# Patient Record
Sex: Male | Born: 1945
Health system: Southern US, Community
[De-identification: ages and names within clinical notes are randomized; demographics above are authoritative.]

## PROBLEM LIST (undated history)

## (undated) DIAGNOSIS — N189 Chronic kidney disease, unspecified: Secondary | ICD-10-CM

## (undated) DIAGNOSIS — I10 Essential (primary) hypertension: Secondary | ICD-10-CM

## (undated) DIAGNOSIS — J189 Pneumonia, unspecified organism: Secondary | ICD-10-CM

## (undated) DIAGNOSIS — U071 COVID-19: Secondary | ICD-10-CM

## (undated) DIAGNOSIS — I639 Cerebral infarction, unspecified: Secondary | ICD-10-CM

## (undated) DIAGNOSIS — K219 Gastro-esophageal reflux disease without esophagitis: Secondary | ICD-10-CM

## (undated) DIAGNOSIS — I2510 Atherosclerotic heart disease of native coronary artery without angina pectoris: Secondary | ICD-10-CM

## (undated) DIAGNOSIS — J449 Chronic obstructive pulmonary disease, unspecified: Secondary | ICD-10-CM

## (undated) DIAGNOSIS — R011 Cardiac murmur, unspecified: Secondary | ICD-10-CM

## (undated) DIAGNOSIS — B20 Human immunodeficiency virus [HIV] disease: Secondary | ICD-10-CM

## (undated) DIAGNOSIS — G459 Transient cerebral ischemic attack, unspecified: Secondary | ICD-10-CM

## (undated) DIAGNOSIS — Z87442 Personal history of urinary calculi: Secondary | ICD-10-CM

## (undated) DIAGNOSIS — K635 Polyp of colon: Secondary | ICD-10-CM

## (undated) HISTORY — DX: Atherosclerotic heart disease of native coronary artery without angina pectoris: I25.10

## (undated) HISTORY — DX: Chronic obstructive pulmonary disease, unspecified: J44.9

## (undated) HISTORY — DX: Transient cerebral ischemic attack, unspecified: G45.9

## (undated) HISTORY — DX: Human immunodeficiency virus (HIV) disease: B20

## (undated) HISTORY — DX: Chronic kidney disease, unspecified: N18.9

## (undated) HISTORY — PX: CATARACT EXTRACTION W/ INTRAOCULAR LENS IMPLANT: SHX1309

## (undated) HISTORY — DX: COVID-19: U07.1

## (undated) HISTORY — DX: Polyp of colon: K63.5

## (undated) HISTORY — DX: Gastro-esophageal reflux disease without esophagitis: K21.9

## (undated) HISTORY — PX: COLONOSCOPY: SHX174

## (undated) HISTORY — PX: KNEE SURGERY: SHX244

## (undated) HISTORY — PX: TONSILLECTOMY: SUR1361

---

## 1980-05-08 HISTORY — PX: CHOLECYSTECTOMY: SHX55

## 1997-05-08 DIAGNOSIS — B20 Human immunodeficiency virus [HIV] disease: Secondary | ICD-10-CM

## 1997-05-08 DIAGNOSIS — Z21 Asymptomatic human immunodeficiency virus [HIV] infection status: Secondary | ICD-10-CM

## 1997-05-08 HISTORY — DX: Asymptomatic human immunodeficiency virus (hiv) infection status: Z21

## 1997-05-08 HISTORY — DX: Human immunodeficiency virus (HIV) disease: B20

## 1998-04-07 ENCOUNTER — Encounter (INDEPENDENT_AMBULATORY_CARE_PROVIDER_SITE_OTHER): Payer: Self-pay | Admitting: *Deleted

## 1998-04-07 LAB — CONVERTED CEMR LAB: CD4 Count: 130 microliters

## 1998-06-28 ENCOUNTER — Encounter: Admission: RE | Admit: 1998-06-28 | Discharge: 1998-06-28 | Payer: Self-pay | Admitting: Internal Medicine

## 1998-06-28 ENCOUNTER — Ambulatory Visit (HOSPITAL_COMMUNITY): Admission: RE | Admit: 1998-06-28 | Discharge: 1998-06-28 | Payer: Self-pay | Admitting: Internal Medicine

## 1998-07-19 ENCOUNTER — Encounter: Admission: RE | Admit: 1998-07-19 | Discharge: 1998-07-19 | Payer: Self-pay | Admitting: Internal Medicine

## 1998-08-23 ENCOUNTER — Encounter: Admission: RE | Admit: 1998-08-23 | Discharge: 1998-08-23 | Payer: Self-pay | Admitting: Internal Medicine

## 1998-10-18 ENCOUNTER — Ambulatory Visit (HOSPITAL_COMMUNITY): Admission: RE | Admit: 1998-10-18 | Discharge: 1998-10-18 | Payer: Self-pay | Admitting: Internal Medicine

## 1998-11-02 ENCOUNTER — Encounter: Admission: RE | Admit: 1998-11-02 | Discharge: 1998-11-02 | Payer: Self-pay | Admitting: Infectious Diseases

## 1999-01-13 ENCOUNTER — Ambulatory Visit (HOSPITAL_COMMUNITY): Admission: RE | Admit: 1999-01-13 | Discharge: 1999-01-13 | Payer: Self-pay | Admitting: Internal Medicine

## 1999-01-13 ENCOUNTER — Encounter: Admission: RE | Admit: 1999-01-13 | Discharge: 1999-01-13 | Payer: Self-pay | Admitting: Internal Medicine

## 1999-01-31 ENCOUNTER — Encounter: Admission: RE | Admit: 1999-01-31 | Discharge: 1999-01-31 | Payer: Self-pay | Admitting: Internal Medicine

## 1999-04-11 ENCOUNTER — Ambulatory Visit (HOSPITAL_COMMUNITY): Admission: RE | Admit: 1999-04-11 | Discharge: 1999-04-11 | Payer: Self-pay | Admitting: Internal Medicine

## 1999-04-11 ENCOUNTER — Encounter: Admission: RE | Admit: 1999-04-11 | Discharge: 1999-04-11 | Payer: Self-pay | Admitting: Internal Medicine

## 1999-04-25 ENCOUNTER — Encounter: Admission: RE | Admit: 1999-04-25 | Discharge: 1999-04-25 | Payer: Self-pay | Admitting: Internal Medicine

## 1999-06-27 ENCOUNTER — Ambulatory Visit (HOSPITAL_COMMUNITY): Admission: RE | Admit: 1999-06-27 | Discharge: 1999-06-27 | Payer: Self-pay | Admitting: Internal Medicine

## 1999-06-27 ENCOUNTER — Encounter: Admission: RE | Admit: 1999-06-27 | Discharge: 1999-06-27 | Payer: Self-pay | Admitting: Internal Medicine

## 1999-07-11 ENCOUNTER — Encounter: Admission: RE | Admit: 1999-07-11 | Discharge: 1999-07-11 | Payer: Self-pay | Admitting: Internal Medicine

## 1999-12-19 ENCOUNTER — Encounter: Admission: RE | Admit: 1999-12-19 | Discharge: 1999-12-19 | Payer: Self-pay | Admitting: Internal Medicine

## 1999-12-19 ENCOUNTER — Ambulatory Visit (HOSPITAL_COMMUNITY): Admission: RE | Admit: 1999-12-19 | Discharge: 1999-12-19 | Payer: Self-pay | Admitting: Internal Medicine

## 2000-01-02 ENCOUNTER — Encounter: Admission: RE | Admit: 2000-01-02 | Discharge: 2000-01-02 | Payer: Self-pay | Admitting: Internal Medicine

## 2000-06-19 ENCOUNTER — Ambulatory Visit (HOSPITAL_COMMUNITY): Admission: RE | Admit: 2000-06-19 | Discharge: 2000-06-19 | Payer: Self-pay | Admitting: Internal Medicine

## 2000-06-19 ENCOUNTER — Encounter: Admission: RE | Admit: 2000-06-19 | Discharge: 2000-06-19 | Payer: Self-pay | Admitting: Internal Medicine

## 2000-07-03 ENCOUNTER — Encounter: Admission: RE | Admit: 2000-07-03 | Discharge: 2000-07-03 | Payer: Self-pay | Admitting: Internal Medicine

## 2000-12-17 ENCOUNTER — Ambulatory Visit (HOSPITAL_COMMUNITY): Admission: RE | Admit: 2000-12-17 | Discharge: 2000-12-17 | Payer: Self-pay | Admitting: Internal Medicine

## 2000-12-17 ENCOUNTER — Encounter: Admission: RE | Admit: 2000-12-17 | Discharge: 2000-12-17 | Payer: Self-pay | Admitting: Internal Medicine

## 2001-01-01 ENCOUNTER — Encounter: Admission: RE | Admit: 2001-01-01 | Discharge: 2001-01-01 | Payer: Self-pay | Admitting: Internal Medicine

## 2001-01-29 ENCOUNTER — Encounter: Admission: RE | Admit: 2001-01-29 | Discharge: 2001-01-29 | Payer: Self-pay | Admitting: Internal Medicine

## 2001-07-09 ENCOUNTER — Ambulatory Visit (HOSPITAL_COMMUNITY): Admission: RE | Admit: 2001-07-09 | Discharge: 2001-07-09 | Payer: Self-pay | Admitting: Internal Medicine

## 2001-07-09 ENCOUNTER — Encounter: Admission: RE | Admit: 2001-07-09 | Discharge: 2001-07-09 | Payer: Self-pay | Admitting: Internal Medicine

## 2001-09-24 ENCOUNTER — Encounter: Admission: RE | Admit: 2001-09-24 | Discharge: 2001-09-24 | Payer: Self-pay | Admitting: Internal Medicine

## 2001-11-07 ENCOUNTER — Encounter: Admission: RE | Admit: 2001-11-07 | Discharge: 2001-11-07 | Payer: Self-pay | Admitting: Internal Medicine

## 2002-05-12 ENCOUNTER — Encounter: Admission: RE | Admit: 2002-05-12 | Discharge: 2002-05-12 | Payer: Self-pay | Admitting: Internal Medicine

## 2002-05-12 ENCOUNTER — Ambulatory Visit (HOSPITAL_COMMUNITY): Admission: RE | Admit: 2002-05-12 | Discharge: 2002-05-12 | Payer: Self-pay | Admitting: Internal Medicine

## 2002-05-26 ENCOUNTER — Encounter: Admission: RE | Admit: 2002-05-26 | Discharge: 2002-05-26 | Payer: Self-pay | Admitting: Internal Medicine

## 2002-11-03 ENCOUNTER — Encounter: Payer: Self-pay | Admitting: Internal Medicine

## 2002-11-03 ENCOUNTER — Ambulatory Visit (HOSPITAL_COMMUNITY): Admission: RE | Admit: 2002-11-03 | Discharge: 2002-11-03 | Payer: Self-pay | Admitting: Internal Medicine

## 2002-11-03 ENCOUNTER — Encounter: Admission: RE | Admit: 2002-11-03 | Discharge: 2002-11-03 | Payer: Self-pay | Admitting: Internal Medicine

## 2002-11-25 ENCOUNTER — Encounter: Admission: RE | Admit: 2002-11-25 | Discharge: 2002-11-25 | Payer: Self-pay | Admitting: Internal Medicine

## 2003-08-18 ENCOUNTER — Ambulatory Visit (HOSPITAL_COMMUNITY): Admission: RE | Admit: 2003-08-18 | Discharge: 2003-08-18 | Payer: Self-pay | Admitting: Internal Medicine

## 2003-08-18 ENCOUNTER — Encounter: Admission: RE | Admit: 2003-08-18 | Discharge: 2003-08-18 | Payer: Self-pay | Admitting: Internal Medicine

## 2003-09-01 ENCOUNTER — Encounter: Admission: RE | Admit: 2003-09-01 | Discharge: 2003-09-01 | Payer: Self-pay | Admitting: Internal Medicine

## 2005-11-04 ENCOUNTER — Ambulatory Visit: Payer: Self-pay | Admitting: Internal Medicine

## 2005-11-04 ENCOUNTER — Inpatient Hospital Stay (HOSPITAL_COMMUNITY): Admission: EM | Admit: 2005-11-04 | Discharge: 2005-11-09 | Payer: Self-pay | Admitting: Emergency Medicine

## 2005-11-05 ENCOUNTER — Encounter (INDEPENDENT_AMBULATORY_CARE_PROVIDER_SITE_OTHER): Payer: Self-pay | Admitting: *Deleted

## 2005-11-20 ENCOUNTER — Ambulatory Visit: Payer: Self-pay | Admitting: Internal Medicine

## 2005-12-04 ENCOUNTER — Ambulatory Visit: Payer: Self-pay | Admitting: Internal Medicine

## 2005-12-12 ENCOUNTER — Ambulatory Visit: Payer: Self-pay | Admitting: Internal Medicine

## 2005-12-20 ENCOUNTER — Ambulatory Visit (HOSPITAL_COMMUNITY): Admission: RE | Admit: 2005-12-20 | Discharge: 2005-12-20 | Payer: Self-pay | Admitting: Internal Medicine

## 2005-12-20 ENCOUNTER — Ambulatory Visit: Payer: Self-pay | Admitting: Internal Medicine

## 2006-02-01 ENCOUNTER — Encounter (INDEPENDENT_AMBULATORY_CARE_PROVIDER_SITE_OTHER): Payer: Self-pay | Admitting: *Deleted

## 2006-02-01 ENCOUNTER — Encounter: Admission: RE | Admit: 2006-02-01 | Discharge: 2006-02-01 | Payer: Self-pay | Admitting: Internal Medicine

## 2006-02-01 ENCOUNTER — Ambulatory Visit: Payer: Self-pay | Admitting: Internal Medicine

## 2006-02-01 LAB — CONVERTED CEMR LAB: CD4 Count: 140 microliters

## 2006-02-20 ENCOUNTER — Encounter (INDEPENDENT_AMBULATORY_CARE_PROVIDER_SITE_OTHER): Payer: Self-pay | Admitting: *Deleted

## 2006-02-20 ENCOUNTER — Ambulatory Visit: Payer: Self-pay | Admitting: Internal Medicine

## 2006-02-27 DIAGNOSIS — K209 Esophagitis, unspecified without bleeding: Secondary | ICD-10-CM | POA: Insufficient documentation

## 2006-02-27 DIAGNOSIS — B20 Human immunodeficiency virus [HIV] disease: Secondary | ICD-10-CM

## 2006-02-27 DIAGNOSIS — Z9889 Other specified postprocedural states: Secondary | ICD-10-CM

## 2006-02-27 DIAGNOSIS — L03221 Cellulitis of neck: Secondary | ICD-10-CM

## 2006-02-27 DIAGNOSIS — Z8709 Personal history of other diseases of the respiratory system: Secondary | ICD-10-CM | POA: Insufficient documentation

## 2006-02-27 DIAGNOSIS — I10 Essential (primary) hypertension: Secondary | ICD-10-CM | POA: Insufficient documentation

## 2006-02-27 DIAGNOSIS — L0211 Cutaneous abscess of neck: Secondary | ICD-10-CM

## 2006-03-16 ENCOUNTER — Encounter: Payer: Self-pay | Admitting: Internal Medicine

## 2006-03-22 ENCOUNTER — Ambulatory Visit: Payer: Self-pay | Admitting: Internal Medicine

## 2006-05-28 DIAGNOSIS — Z9089 Acquired absence of other organs: Secondary | ICD-10-CM

## 2006-05-28 DIAGNOSIS — L270 Generalized skin eruption due to drugs and medicaments taken internally: Secondary | ICD-10-CM | POA: Insufficient documentation

## 2006-06-04 ENCOUNTER — Encounter: Admission: RE | Admit: 2006-06-04 | Discharge: 2006-06-04 | Payer: Self-pay | Admitting: Internal Medicine

## 2006-06-04 ENCOUNTER — Ambulatory Visit: Payer: Self-pay | Admitting: Internal Medicine

## 2006-06-04 ENCOUNTER — Encounter (INDEPENDENT_AMBULATORY_CARE_PROVIDER_SITE_OTHER): Payer: Self-pay | Admitting: *Deleted

## 2006-06-04 LAB — CONVERTED CEMR LAB
ALT: 10 units/L (ref 0–53)
Albumin: 3.9 g/dL (ref 3.5–5.2)
Alkaline Phosphatase: 97 units/L (ref 39–117)
Basophils Absolute: 0 10*3/uL (ref 0.0–0.1)
Basophils Relative: 0 % (ref 0–1)
Calcium: 9 mg/dL (ref 8.4–10.5)
Cholesterol: 175 mg/dL (ref 0–200)
Eosinophils Absolute: 0.1 10*3/uL (ref 0.0–0.7)
Eosinophils Relative: 2 % (ref 0–5)
HDL: 40 mg/dL (ref >39)
HIV 1 RNA Quant: 186 copies/mL — ABNORMAL HIGH (ref <50)
LDL Cholesterol: 94 mg/dL (ref 0–99)
Lymphocytes Relative: 32 % (ref 12–46)
Lymphs Abs: 1.9 10*3/uL (ref 0.7–3.3)
Monocytes Absolute: 0.6 10*3/uL (ref 0.2–0.7)
Neutro Abs: 3.5 10*3/uL (ref 1.7–7.7)
Neutrophils Relative %: 57 % (ref 43–77)
Platelets: 207 10*3/uL (ref 150–400)
RBC: 4.12 M/uL — ABNORMAL LOW (ref 4.22–5.81)
Total CHOL/HDL Ratio: 4.4
VLDL: 41 mg/dL — ABNORMAL HIGH (ref 0–40)
WBC: 6.1 10*3/uL (ref 4.0–10.5)

## 2006-06-26 ENCOUNTER — Ambulatory Visit: Payer: Self-pay | Admitting: Internal Medicine

## 2006-07-02 ENCOUNTER — Encounter (INDEPENDENT_AMBULATORY_CARE_PROVIDER_SITE_OTHER): Payer: Self-pay | Admitting: *Deleted

## 2006-07-10 ENCOUNTER — Telehealth (INDEPENDENT_AMBULATORY_CARE_PROVIDER_SITE_OTHER): Payer: Self-pay | Admitting: Infectious Diseases

## 2006-07-12 ENCOUNTER — Telehealth (INDEPENDENT_AMBULATORY_CARE_PROVIDER_SITE_OTHER): Payer: Self-pay | Admitting: *Deleted

## 2006-07-15 ENCOUNTER — Encounter (INDEPENDENT_AMBULATORY_CARE_PROVIDER_SITE_OTHER): Payer: Self-pay | Admitting: *Deleted

## 2006-08-02 ENCOUNTER — Telehealth: Payer: Self-pay | Admitting: Internal Medicine

## 2006-08-28 ENCOUNTER — Telehealth: Payer: Self-pay | Admitting: Internal Medicine

## 2006-09-14 ENCOUNTER — Telehealth: Payer: Self-pay | Admitting: Internal Medicine

## 2006-09-14 ENCOUNTER — Encounter: Admission: RE | Admit: 2006-09-14 | Discharge: 2006-09-14 | Payer: Self-pay | Admitting: Internal Medicine

## 2006-09-14 ENCOUNTER — Ambulatory Visit: Payer: Self-pay | Admitting: Internal Medicine

## 2006-09-14 LAB — CONVERTED CEMR LAB
HIV 1 RNA Quant: 64 copies/mL — ABNORMAL HIGH (ref <50)
HIV-1 RNA Quant, Log: 1.81 — ABNORMAL HIGH (ref <1.70)

## 2006-09-25 ENCOUNTER — Ambulatory Visit: Payer: Self-pay | Admitting: Internal Medicine

## 2006-09-28 ENCOUNTER — Telehealth: Payer: Self-pay | Admitting: Internal Medicine

## 2006-10-26 ENCOUNTER — Telehealth: Payer: Self-pay | Admitting: Internal Medicine

## 2006-11-27 ENCOUNTER — Telehealth: Payer: Self-pay | Admitting: Internal Medicine

## 2006-12-26 ENCOUNTER — Telehealth: Payer: Self-pay | Admitting: Internal Medicine

## 2007-01-29 ENCOUNTER — Telehealth: Payer: Self-pay | Admitting: Internal Medicine

## 2007-02-12 ENCOUNTER — Telehealth: Payer: Self-pay | Admitting: Internal Medicine

## 2007-02-20 ENCOUNTER — Ambulatory Visit: Payer: Self-pay | Admitting: Internal Medicine

## 2007-02-20 ENCOUNTER — Encounter: Admission: RE | Admit: 2007-02-20 | Discharge: 2007-02-20 | Payer: Self-pay | Admitting: Internal Medicine

## 2007-02-20 LAB — CONVERTED CEMR LAB
HIV 1 RNA Quant: 194 copies/mL — ABNORMAL HIGH (ref <50)
HIV-1 RNA Quant, Log: 2.29 — ABNORMAL HIGH (ref <1.70)

## 2007-02-26 ENCOUNTER — Telehealth: Payer: Self-pay | Admitting: Internal Medicine

## 2007-03-21 ENCOUNTER — Ambulatory Visit: Payer: Self-pay | Admitting: Internal Medicine

## 2007-03-29 ENCOUNTER — Telehealth: Payer: Self-pay | Admitting: Internal Medicine

## 2007-04-29 ENCOUNTER — Telehealth: Payer: Self-pay | Admitting: Internal Medicine

## 2007-05-28 ENCOUNTER — Telehealth: Payer: Self-pay | Admitting: Internal Medicine

## 2007-06-26 ENCOUNTER — Telehealth: Payer: Self-pay | Admitting: Internal Medicine

## 2007-07-15 ENCOUNTER — Encounter (INDEPENDENT_AMBULATORY_CARE_PROVIDER_SITE_OTHER): Payer: Self-pay | Admitting: *Deleted

## 2007-07-23 ENCOUNTER — Telehealth: Payer: Self-pay | Admitting: Internal Medicine

## 2007-08-01 ENCOUNTER — Encounter (INDEPENDENT_AMBULATORY_CARE_PROVIDER_SITE_OTHER): Payer: Self-pay | Admitting: *Deleted

## 2007-08-22 ENCOUNTER — Telehealth (INDEPENDENT_AMBULATORY_CARE_PROVIDER_SITE_OTHER): Payer: Self-pay | Admitting: *Deleted

## 2007-09-05 ENCOUNTER — Encounter: Admission: RE | Admit: 2007-09-05 | Discharge: 2007-09-05 | Payer: Self-pay | Admitting: Internal Medicine

## 2007-09-05 ENCOUNTER — Ambulatory Visit: Payer: Self-pay | Admitting: Internal Medicine

## 2007-09-05 LAB — CONVERTED CEMR LAB
ALT: 14 units/L (ref 0–53)
AST: 19 units/L (ref 0–37)
Albumin: 3.9 g/dL (ref 3.5–5.2)
CO2: 25 meq/L (ref 19–32)
Calcium: 8.7 mg/dL (ref 8.4–10.5)
Cholesterol: 204 mg/dL — ABNORMAL HIGH (ref 0–200)
Hemoglobin: 13.4 g/dL (ref 13.0–17.0)
MCHC: 33 g/dL (ref 30.0–36.0)
MCV: 95.8 fL (ref 78.0–100.0)
RBC: 4.24 M/uL (ref 4.22–5.81)
Total CHOL/HDL Ratio: 3.8
Total Protein: 6.2 g/dL (ref 6.0–8.3)
WBC: 3.8 10*3/uL — ABNORMAL LOW (ref 4.0–10.5)

## 2007-09-19 ENCOUNTER — Ambulatory Visit: Payer: Self-pay | Admitting: Internal Medicine

## 2007-09-19 DIAGNOSIS — IMO0002 Reserved for concepts with insufficient information to code with codable children: Secondary | ICD-10-CM

## 2007-09-19 DIAGNOSIS — R599 Enlarged lymph nodes, unspecified: Secondary | ICD-10-CM | POA: Insufficient documentation

## 2007-09-23 ENCOUNTER — Telehealth (INDEPENDENT_AMBULATORY_CARE_PROVIDER_SITE_OTHER): Payer: Self-pay | Admitting: *Deleted

## 2007-09-26 ENCOUNTER — Telehealth (INDEPENDENT_AMBULATORY_CARE_PROVIDER_SITE_OTHER): Payer: Self-pay | Admitting: *Deleted

## 2007-10-21 ENCOUNTER — Telehealth (INDEPENDENT_AMBULATORY_CARE_PROVIDER_SITE_OTHER): Payer: Self-pay | Admitting: *Deleted

## 2007-11-20 ENCOUNTER — Telehealth (INDEPENDENT_AMBULATORY_CARE_PROVIDER_SITE_OTHER): Payer: Self-pay | Admitting: *Deleted

## 2007-12-18 ENCOUNTER — Telehealth (INDEPENDENT_AMBULATORY_CARE_PROVIDER_SITE_OTHER): Payer: Self-pay | Admitting: *Deleted

## 2008-01-20 ENCOUNTER — Telehealth (INDEPENDENT_AMBULATORY_CARE_PROVIDER_SITE_OTHER): Payer: Self-pay | Admitting: *Deleted

## 2008-02-14 ENCOUNTER — Telehealth (INDEPENDENT_AMBULATORY_CARE_PROVIDER_SITE_OTHER): Payer: Self-pay | Admitting: *Deleted

## 2008-03-16 ENCOUNTER — Ambulatory Visit: Payer: Self-pay | Admitting: Internal Medicine

## 2008-03-18 ENCOUNTER — Telehealth (INDEPENDENT_AMBULATORY_CARE_PROVIDER_SITE_OTHER): Payer: Self-pay | Admitting: *Deleted

## 2008-03-31 ENCOUNTER — Ambulatory Visit: Payer: Self-pay | Admitting: Internal Medicine

## 2008-03-31 DIAGNOSIS — R209 Unspecified disturbances of skin sensation: Secondary | ICD-10-CM

## 2008-04-15 ENCOUNTER — Telehealth (INDEPENDENT_AMBULATORY_CARE_PROVIDER_SITE_OTHER): Payer: Self-pay | Admitting: *Deleted

## 2008-05-14 ENCOUNTER — Telehealth (INDEPENDENT_AMBULATORY_CARE_PROVIDER_SITE_OTHER): Payer: Self-pay | Admitting: *Deleted

## 2008-06-15 ENCOUNTER — Telehealth (INDEPENDENT_AMBULATORY_CARE_PROVIDER_SITE_OTHER): Payer: Self-pay | Admitting: *Deleted

## 2008-06-24 ENCOUNTER — Encounter (INDEPENDENT_AMBULATORY_CARE_PROVIDER_SITE_OTHER): Payer: Self-pay | Admitting: *Deleted

## 2008-07-15 ENCOUNTER — Telehealth (INDEPENDENT_AMBULATORY_CARE_PROVIDER_SITE_OTHER): Payer: Self-pay | Admitting: *Deleted

## 2008-07-23 ENCOUNTER — Encounter (INDEPENDENT_AMBULATORY_CARE_PROVIDER_SITE_OTHER): Payer: Self-pay | Admitting: *Deleted

## 2008-08-10 ENCOUNTER — Telehealth (INDEPENDENT_AMBULATORY_CARE_PROVIDER_SITE_OTHER): Payer: Self-pay | Admitting: *Deleted

## 2008-09-03 ENCOUNTER — Telehealth (INDEPENDENT_AMBULATORY_CARE_PROVIDER_SITE_OTHER): Payer: Self-pay | Admitting: *Deleted

## 2008-09-14 ENCOUNTER — Ambulatory Visit: Payer: Self-pay | Admitting: Internal Medicine

## 2008-09-14 LAB — CONVERTED CEMR LAB
ALT: 13 units/L (ref 0–53)
AST: 17 units/L (ref 0–37)
Albumin: 3.8 g/dL (ref 3.5–5.2)
Alkaline Phosphatase: 78 units/L (ref 39–117)
BUN: 22 mg/dL (ref 6–23)
CO2: 23 meq/L (ref 19–32)
Creatinine, Ser: 1.23 mg/dL (ref 0.40–1.50)
Eosinophils Absolute: 0.1 10*3/uL (ref 0.0–0.7)
Eosinophils Relative: 1 % (ref 0–5)
Glucose, Bld: 93 mg/dL (ref 70–99)
HDL: 50 mg/dL (ref >39)
HIV-1 RNA Quant, Log: 2.22 — ABNORMAL HIGH (ref <1.68)
MCHC: 33.3 g/dL (ref 30.0–36.0)
Monocytes Absolute: 0.4 10*3/uL (ref 0.1–1.0)
Monocytes Relative: 9 % (ref 3–12)
Neutro Abs: 2.5 10*3/uL (ref 1.7–7.7)
Neutrophils Relative %: 53 % (ref 43–77)
Sodium: 138 meq/L (ref 135–145)
Total Protein: 6.1 g/dL (ref 6.0–8.3)

## 2008-09-22 ENCOUNTER — Ambulatory Visit: Payer: Self-pay | Admitting: Internal Medicine

## 2008-09-30 ENCOUNTER — Telehealth (INDEPENDENT_AMBULATORY_CARE_PROVIDER_SITE_OTHER): Payer: Self-pay | Admitting: *Deleted

## 2008-11-03 ENCOUNTER — Telehealth (INDEPENDENT_AMBULATORY_CARE_PROVIDER_SITE_OTHER): Payer: Self-pay | Admitting: *Deleted

## 2008-11-30 ENCOUNTER — Telehealth: Payer: Self-pay | Admitting: Internal Medicine

## 2008-12-28 ENCOUNTER — Telehealth (INDEPENDENT_AMBULATORY_CARE_PROVIDER_SITE_OTHER): Payer: Self-pay | Admitting: *Deleted

## 2009-01-06 ENCOUNTER — Encounter: Payer: Self-pay | Admitting: Internal Medicine

## 2009-01-27 ENCOUNTER — Telehealth (INDEPENDENT_AMBULATORY_CARE_PROVIDER_SITE_OTHER): Payer: Self-pay | Admitting: *Deleted

## 2009-02-18 ENCOUNTER — Ambulatory Visit: Payer: Self-pay | Admitting: Internal Medicine

## 2009-02-24 ENCOUNTER — Telehealth (INDEPENDENT_AMBULATORY_CARE_PROVIDER_SITE_OTHER): Payer: Self-pay | Admitting: *Deleted

## 2009-03-08 ENCOUNTER — Ambulatory Visit: Payer: Self-pay | Admitting: Internal Medicine

## 2009-03-08 DIAGNOSIS — R1310 Dysphagia, unspecified: Secondary | ICD-10-CM

## 2009-03-29 ENCOUNTER — Telehealth (INDEPENDENT_AMBULATORY_CARE_PROVIDER_SITE_OTHER): Payer: Self-pay | Admitting: *Deleted

## 2009-04-02 ENCOUNTER — Emergency Department (HOSPITAL_COMMUNITY): Admission: EM | Admit: 2009-04-02 | Discharge: 2009-04-02 | Payer: Self-pay | Admitting: Emergency Medicine

## 2009-04-08 ENCOUNTER — Ambulatory Visit: Payer: Self-pay | Admitting: Internal Medicine

## 2009-04-12 ENCOUNTER — Encounter: Payer: Self-pay | Admitting: Internal Medicine

## 2009-04-28 ENCOUNTER — Telehealth (INDEPENDENT_AMBULATORY_CARE_PROVIDER_SITE_OTHER): Payer: Self-pay | Admitting: *Deleted

## 2009-05-11 ENCOUNTER — Encounter: Payer: Self-pay | Admitting: Internal Medicine

## 2009-05-24 ENCOUNTER — Telehealth (INDEPENDENT_AMBULATORY_CARE_PROVIDER_SITE_OTHER): Payer: Self-pay | Admitting: *Deleted

## 2009-06-18 ENCOUNTER — Telehealth (INDEPENDENT_AMBULATORY_CARE_PROVIDER_SITE_OTHER): Payer: Self-pay | Admitting: *Deleted

## 2009-06-25 ENCOUNTER — Encounter (INDEPENDENT_AMBULATORY_CARE_PROVIDER_SITE_OTHER): Payer: Self-pay | Admitting: *Deleted

## 2009-07-21 ENCOUNTER — Ambulatory Visit: Payer: Self-pay | Admitting: Internal Medicine

## 2009-07-21 LAB — CONVERTED CEMR LAB
ALT: 25 units/L (ref 0–53)
BUN: 15 mg/dL (ref 6–23)
Basophils Absolute: 0 10*3/uL (ref 0.0–0.1)
CO2: 27 meq/L (ref 19–32)
Calcium: 8.1 mg/dL — ABNORMAL LOW (ref 8.4–10.5)
Creatinine, Ser: 1.17 mg/dL (ref 0.40–1.50)
HDL: 43 mg/dL (ref >39)
HIV 1 RNA Quant: 65 copies/mL — ABNORMAL HIGH (ref <48)
Hemoglobin: 13 g/dL (ref 13.0–17.0)
Lymphs Abs: 1.5 10*3/uL (ref 0.7–4.0)
MCHC: 32.8 g/dL (ref 30.0–36.0)
Monocytes Absolute: 0.4 10*3/uL (ref 0.1–1.0)
Monocytes Relative: 10 % (ref 3–12)
Neutro Abs: 2.4 10*3/uL (ref 1.7–7.7)
Neutrophils Relative %: 54 % (ref 43–77)
Potassium: 4 meq/L (ref 3.5–5.3)
RBC: 4.05 M/uL — ABNORMAL LOW (ref 4.22–5.81)
Triglycerides: 171 mg/dL — ABNORMAL HIGH (ref <150)
WBC: 4.5 10*3/uL (ref 4.0–10.5)

## 2009-07-27 ENCOUNTER — Telehealth (INDEPENDENT_AMBULATORY_CARE_PROVIDER_SITE_OTHER): Payer: Self-pay | Admitting: *Deleted

## 2009-07-27 ENCOUNTER — Encounter (INDEPENDENT_AMBULATORY_CARE_PROVIDER_SITE_OTHER): Payer: Self-pay | Admitting: *Deleted

## 2009-08-12 ENCOUNTER — Ambulatory Visit: Payer: Self-pay | Admitting: Internal Medicine

## 2009-09-08 ENCOUNTER — Telehealth (INDEPENDENT_AMBULATORY_CARE_PROVIDER_SITE_OTHER): Payer: Self-pay | Admitting: *Deleted

## 2009-09-16 ENCOUNTER — Encounter (INDEPENDENT_AMBULATORY_CARE_PROVIDER_SITE_OTHER): Payer: Self-pay | Admitting: *Deleted

## 2009-12-21 ENCOUNTER — Encounter (INDEPENDENT_AMBULATORY_CARE_PROVIDER_SITE_OTHER): Payer: Self-pay | Admitting: *Deleted

## 2010-01-12 ENCOUNTER — Ambulatory Visit: Payer: Self-pay | Admitting: Internal Medicine

## 2010-01-26 ENCOUNTER — Ambulatory Visit: Payer: Self-pay | Admitting: Internal Medicine

## 2010-01-27 ENCOUNTER — Telehealth (INDEPENDENT_AMBULATORY_CARE_PROVIDER_SITE_OTHER): Payer: Self-pay | Admitting: *Deleted

## 2010-01-31 ENCOUNTER — Telehealth (INDEPENDENT_AMBULATORY_CARE_PROVIDER_SITE_OTHER): Payer: Self-pay | Admitting: *Deleted

## 2010-02-09 ENCOUNTER — Ambulatory Visit: Payer: Self-pay | Admitting: Gastroenterology

## 2010-02-14 ENCOUNTER — Telehealth: Payer: Self-pay | Admitting: Gastroenterology

## 2010-02-25 ENCOUNTER — Ambulatory Visit: Payer: Self-pay | Admitting: Gastroenterology

## 2010-03-22 ENCOUNTER — Encounter (INDEPENDENT_AMBULATORY_CARE_PROVIDER_SITE_OTHER): Payer: Self-pay | Admitting: *Deleted

## 2010-04-08 ENCOUNTER — Ambulatory Visit: Payer: Self-pay | Admitting: Internal Medicine

## 2010-04-08 LAB — CONVERTED CEMR LAB: HIV-1 RNA Quant, Log: 1.46 — ABNORMAL HIGH (ref <1.30)

## 2010-04-11 ENCOUNTER — Emergency Department (HOSPITAL_COMMUNITY)
Admission: EM | Admit: 2010-04-11 | Discharge: 2010-04-11 | Payer: Self-pay | Source: Home / Self Care | Admitting: Emergency Medicine

## 2010-04-11 ENCOUNTER — Observation Stay (HOSPITAL_COMMUNITY): Admission: EM | Admit: 2010-04-11 | Discharge: 2010-04-12 | Payer: Self-pay | Source: Home / Self Care

## 2010-04-12 ENCOUNTER — Encounter: Payer: Self-pay | Admitting: Internal Medicine

## 2010-04-12 ENCOUNTER — Encounter (INDEPENDENT_AMBULATORY_CARE_PROVIDER_SITE_OTHER): Payer: Self-pay | Admitting: Internal Medicine

## 2010-05-17 ENCOUNTER — Encounter: Payer: Self-pay | Admitting: Internal Medicine

## 2010-05-17 ENCOUNTER — Ambulatory Visit
Admission: RE | Admit: 2010-05-17 | Discharge: 2010-05-17 | Payer: Self-pay | Source: Home / Self Care | Attending: Internal Medicine | Admitting: Internal Medicine

## 2010-05-17 DIAGNOSIS — M7989 Other specified soft tissue disorders: Secondary | ICD-10-CM | POA: Insufficient documentation

## 2010-05-17 DIAGNOSIS — K219 Gastro-esophageal reflux disease without esophagitis: Secondary | ICD-10-CM | POA: Insufficient documentation

## 2010-05-17 DIAGNOSIS — R079 Chest pain, unspecified: Secondary | ICD-10-CM | POA: Insufficient documentation

## 2010-05-17 LAB — CONVERTED CEMR LAB
Albumin: 4.2 g/dL (ref 3.5–5.2)
Alkaline Phosphatase: 88 units/L (ref 39–117)
BUN: 20 mg/dL (ref 6–23)
Bacteria, UA: NONE SEEN
Basophils Absolute: 0 10*3/uL (ref 0.0–0.1)
Basophils Relative: 0 % (ref 0–1)
Casts: NONE SEEN /lpf
Creatinine, Ser: 1.16 mg/dL (ref 0.40–1.50)
Crystals: NONE SEEN
Leukocytes, UA: NEGATIVE
Lymphocytes Relative: 38 % (ref 12–46)
Lymphs Abs: 1.7 10*3/uL (ref 0.7–4.0)
Monocytes Relative: 8 % (ref 3–12)
RBC: 4.24 M/uL (ref 4.22–5.81)
Total Bilirubin: 0.5 mg/dL (ref 0.3–1.2)
Total Protein: 6.5 g/dL (ref 6.0–8.3)
Urine Glucose: 250 mg/dL — AB
WBC: 4.4 10*3/uL (ref 4.0–10.5)

## 2010-06-07 NOTE — Letter (Signed)
Summary: New Patient letter  [facility]  [address] [place], [place] [address]   Phone: [phone]  Fax: [phone]       09/16/2009 MRN: [id number]  Jeffrey [name] Jeffrey, [name]  [address]  Dear Jeffrey [name],  Welcome to the Gastroenterology Division at [facility].    You are scheduled to see Dr.  Deatra Ina on 10/18/2009 at 2:00PM on the 3rd floor at [facility], [place] [employer].  We ask that you try to arrive at our office 15 minutes prior to your appointment time to allow for check-in.  We would like you to complete the enclosed self-administered evaluation form prior to your visit and bring it with you on the day of your appointment.  We will review it with you.  Also, please bring a complete list of all your medications or, if you prefer, bring the medication bottles and we will list them.  Please bring your insurance card so that we may make a copy of it.  If your insurance requires a referral to see a specialist, please bring your referral form from your primary care physician.  Co-payments are due at the time of your visit and may be paid by cash, check or credit card.     Your office visit will consist of a consult with your physician (includes a physical exam), any laboratory testing he/she may order, scheduling of any necessary diagnostic testing (e.g. x-ray, ultrasound, CT-scan), and scheduling of a procedure (e.g. Endoscopy, Colonoscopy) if required.  Please allow enough time on your schedule to allow for any/all of these possibilities.    If you cannot keep your appointment, please call [phone] to cancel or reschedule prior to your appointment date.  This allows [place] the opportunity to schedule an appointment for another patient in need of care.  If you do not cancel or reschedule by 5 p.m. the business day prior to your appointment date, you will be charged a $50.00 late cancellation/no-show fee.    Thank you for choosing [facility] for your medical needs.  We appreciate the opportunity to care for you.  Please visit [place] at our website  to learn more about our practice.                     Sincerely,                                                             The Gastroenterology Division

## 2010-06-07 NOTE — Miscellaneous (Signed)
  Clinical Lists Changes  Observations: Added new observation of YEARAIDSPOS: 1999  (03/22/2010 15:55)

## 2010-06-07 NOTE — Assessment & Plan Note (Signed)
Summary: F/U OV/VS   CC:  follow-up visit.  History of Present Illness: Jeffrey [name] is in for his routine visit today.  He has not missed a single dose of his medications but is afraid that he will run out and about one week.  Apparently there was a mixup in getting paperwork from [facility] to his new pharmacy with [facility]. He   It is scheduled for his GI evaluation for his dysphasia early next month.  His swallowing difficulty has not changed since his last visit.  Preventive Screening-Counseling & Management  Alcohol-Tobacco     Alcohol drinks/day: 0     Smoking Status: never     Passive Smoke Exposure: no  Caffeine-Diet-Exercise     Caffeine use/day: yes     Does Patient Exercise: no  Hep-HIV-STD-Contraception     HIV Risk: no risk noted     HIV Risk Counseling: not indicated-no HIV risk noted  Safety-Violence-Falls     Seat Belt Use: yes  Comments: declined      Sexual History:  n/a.        Drug Use:  never.     Prior Medication List:  TRUVADA 200-300 MG TABS (EMTRICITABINE-TENOFOVIR) one tab once daily [name] 200-50 MG TABS (LOPINAVIR-RITONAVIR) four tabs once daily PRILOSEC OTC 20 MG TBEC (OMEPRAZOLE MAGNESIUM) Take 1 tablet by mouth once a day   Current Allergies (reviewed today): ! [facility] ! * SUSTIVA Vital Signs:  Patient profile:   65 year old male Height:      70 inches (177.80 cm) Weight:      144.5 pounds (65.68 kg) BMI:     20.81 Temp:     98.3 degrees F (36.83 degrees C) oral Pulse rate:   74 / minute BP sitting:   160 / 90  (left arm) Cuff size:   regular  Vitals Entered By: Lorne Skeens RN (January 26, 2010 2:39 PM) CC: follow-up visit Is Patient Diabetic? No Pain Assessment Patient in pain? no      Nutritional Status BMI of 19 -24 = normal Nutritional Status Detail appetite "good"  Have you ever been in a relationship where you felt threatened, hurt or afraid?No   Does patient need assistance? Functional Status Self care Ambulation  Normal Comments no missed doses   Physical Exam  General:  alert and well-nourished.   Mouth:  pharynx pink and moist, no erythema, and no exudates.  fair dentition.   Lungs:  normal breath sounds.  no crackles and no wheezes.   Heart:  normal rate, regular rhythm, and no murmur.          Medication Adherence: 01/26/2010   Adherence to medications reviewed with patient. Counseling to provide adequate adherence provided                                Impression & Recommendations:  Problem # 1:  HIV DISEASE (ICD-042) His adherence is excellent and his viral load is undetectable.  He will meet to have her medication assistance coordinator today so that he can try to make sure that he does not run out of his medication.  He knows that if he does run short he is to never take less than a full complement of his medications. Diagnostics Reviewed:  HIV: CDC-defined AIDS (09/19/2007)   CD4: 210 (01/13/2010)   WBC: 4.5 (07/21/2009)   Hgb: 13.0 (07/21/2009)   HCT: 39.6 (07/21/2009)   Platelets: 235 (07/21/2009)  HIV genotype: REPORT (02/20/2006)   HIV-1 RNA: <20 copies/mL (01/12/2010)   HBSAg: No (07/02/2006)  Orders: Est. Patient Level III (10626)  Problem # 2:  DYSPHAGIA UNSPECIFIED (ICD-787.20) He will have a GI evaluation and probable EGD in a few weeks. Orders: Est. Patient Level III (94854)  Other Orders: Future Orders: T-CD4SP ([facility]) (CD4SP) ... 04/26/2010 T-HIV Viral Load [id number]) ... 04/26/2010  Patient Instructions: 1)  Please schedule a follow-up appointment in 3 months.  Appended Document: F/U OV + Flu vaccine   Influenza Vaccine    Vaccine Type: Fluvax Non-MCR    Site: left deltoid    Mfr: novartis    Dose: 0.5 ml    Route: IM    Given by: Lorne Skeens RN    Exp. Date: 08/07/2010    Lot #: [id number]  Flu Vaccine Consent Questions    Do you have a history of severe allergic reactions to this vaccine? no    Any prior history of allergic  reactions to egg and/or gelatin? no    Do you have a sensitivity to the preservative Thimersol? no    Do you have a past history of Guillan-Barre Syndrome? no    Do you currently have an acute febrile illness? no    Have you ever had a severe reaction to latex? no    Vaccine information given and explained to patient? yes

## 2010-06-07 NOTE — Progress Notes (Signed)
Summary: refill/mld  Phone Note Call from Patient   Caller: Patient Summary of Call: Patient called requesting prescriptions be called into [place], [place].  Apparently he was not transfered in June 2011 from [facility].  Patient has not been without medications because he had months at home and is now out.  Therefore he said he has not needed them until now.  He was originally one of the "Ghost" patients that was on the state of [place] list to locate because he was not getting his prescriptions filled under [facility].   Initial call taken by: Canary Brim  BS,CPht II,MPH,  January 27, 2010 9:04 AM    Prescriptions: PRILOSEC OTC 20 MG TBEC (OMEPRAZOLE MAGNESIUM) Take 1 tablet by mouth once a day  #30 x 6   Entered by:   Canary Brim  BS,CPht II,MPH   Authorized by:   [name] MD   Signed by:   Canary Brim  BS,CPht II,MPH on 01/27/2010   Method used:   Electronically to        [facility] (retail)       [place], [place]  [address]       Ph: [phone]       Fax:    RxID:   [id number] Vevelyn Francois 200-50 MG TABS (LOPINAVIR-RITONAVIR) four tabs once daily  #120 Not Spec x 6   Entered by:   Canary Brim  BS,CPht II,MPH   Authorized by:   [name] MD   Signed by:   Canary Brim  BS,CPht II,MPH on 01/27/2010   Method used:   Electronically to        [facility] (retail)       [place], [place]  [address]       Ph: [phone]       Fax:    RxID:   [id number] TRUVADA 200-300 MG TABS (EMTRICITABINE-TENOFOVIR) one tab once daily  #30 Not Speci x 6   Entered by:   Canary Brim  BS,CPht II,MPH   Authorized by:   [name] MD   Signed by:   Canary Brim  BS,CPht II,MPH on 01/27/2010   Method used:   Electronically to        [facility] (retail)       [address]       [place],   [address]       Ph: [phone]       Fax:    RxID:   [id number]  Canary Brim  BS,CPht II,MPH  January 27, 2010 9:06 AM

## 2010-06-07 NOTE — Miscellaneous (Signed)
Summary: clinical update/ryan white [place] appr til 08/06/10  Clinical Lists Changes  Observations: Added new observation of AID[employer]AP: Yes 2011 (07/27/2009 16:07)

## 2010-06-07 NOTE — Progress Notes (Signed)
Summary: question re:  Insurance/[employer] White [employer], GI referral  Phone Note Outgoing Call   Call placed by: Lorne Skeens RN,  Sep 08, 2009 3:44 PM Call placed to: Patient Summary of Call: Message left to remind pt. about being in financial information to obtain a [place] to cover GI referral for dysphagia.  Lorne Skeens RN  Sep 08, 2009 3:45 PM    Follow-up for Phone Call        Spoke w/ pt. today.  Has [facility] discount.  Obtained copy of "yellow" card front and back.  RN will call to schedule pt's GI appt. Lorne Skeens RN  Sep 16, 2009 10:18 AM

## 2010-06-07 NOTE — Progress Notes (Signed)
Summary: Resch'd EDG  Phone Note Call from Patient   Caller: Patient Call For: Dr. Deatra Ina Summary of Call: pt. r/s his Endo from 02-16-10 to 03/15/2010 b/c his brother passed away last week in a fire and his funeral is going to be out of town tomorrow. Would you like this pt. charged the cancelation fee? Initial call taken by: Webb Laws,  February 14, 2010 8:59 AM  Follow-up for Phone Call        no Follow-up by: Inda Castle MD,  February 14, 2010 10:44 AM  Additional Follow-up for Phone Call Additional follow up Details #1::        Patient NOT BILLED. Additional Follow-up by: Irwin Brakeman [facility],  February 16, 2010 1:01 PM

## 2010-06-07 NOTE — Assessment & Plan Note (Signed)
Summary: DYSPHAGIA,....EM   History of Present Illness Visit Type: Initial Visit Primary GI MD: Jeffrey Emery MD [facility] Primary Provider: n/a Chief Complaint: Patient feels as if he is choking constantly History of Present Illness:   Jeffrey [name] is a 65 year old male, HIV positive, self-referred for evaluation of dysphagia.  Over the past 2 years he has developed worsening dysphagia.  He now has dysphagia to solids and liquids.  He denies odynophagia.  He denies pyrosis.  The patient takes anti-retroviral medications for his HIV positivity.   GI Review of Systems    Reports dysphagia with liquids and  dysphagia with solids.      Denies abdominal pain, acid reflux, belching, bloating, chest pain, heartburn, loss of appetite, nausea, vomiting, vomiting blood, weight loss, and  weight gain.        Denies anal fissure, black tarry stools, change in bowel habit, constipation, diarrhea, diverticulosis, fecal incontinence, heme positive stool, hemorrhoids, irritable bowel syndrome, jaundice, light color stool, liver problems, rectal bleeding, and  rectal pain. Preventive Screening-Counseling & Management  Alcohol-Tobacco     Smoking Status: never      Drug Use:  no.      Current Medications (verified): 1)  Truvada 200-300 Mg Tabs (Emtricitabine-Tenofovir) .... One Tab Once Daily 2)  [name] 200-50 Mg Tabs (Lopinavir-Ritonavir) .... Four Tabs Once Daily 3)  Prilosec Otc 20 Mg Tbec (Omeprazole Magnesium) .... Take 1 Tablet By Mouth Once A Day  Allergies (verified): 1)  ! * Viramune 2)  ! * Sustiva  Past History:  Past Medical History: Reviewed history from 02/27/2006 and no changes required. HIV disease Hypertension Pneumonia, hx of  Past Surgical History: Reviewed history from 02/27/2006 and no changes required. Cholecystectomy  Family History: No FH of Colon Cancer:  Social History: Occupation: Retired Patient has never smoked.  Alcohol Use - no Daily Caffeine Use  2 Illicit Drug Use - no Drug Use:  no  Review of Systems       The patient complains of cough, hearing problems, and shortness of breath.  The patient denies allergy/sinus, anemia, anxiety-new, arthritis/joint pain, back pain, blood in urine, breast changes/lumps, change in vision, confusion, coughing up blood, depression-new, fainting, fatigue, fever, headaches-new, heart murmur, heart rhythm changes, itching, menstrual pain, muscle pains/cramps, night sweats, nosebleeds, pregnancy symptoms, skin rash, sleeping problems, sore throat, swelling of feet/legs, swollen lymph glands, thirst - excessive , urination - excessive , urination changes/pain, urine leakage, vision changes, and voice change.         All other systems were reviewed and were negative   Vital Signs:  Patient profile:   65 year old male Height:      70 inches Weight:      145 pounds BMI:     20.88 Pulse rate:   76 / minute Pulse rhythm:   regular BP sitting:   130 / 74  (left arm) Cuff size:   regular  Vitals Entered By: Jeffrey [name] [place] Deborra Medina) (February 09, 2010 8:41 AM)  Physical Exam  Additional Exam:  On  exam he is a well-developed well-nourished male  skin: anicteric HEENT: normocephalic; PEERLA; no nasal or pharyngeal abnormalities neck: supple nodes: no cervical lymphadenopathy chest: clear to ausculatation and percussion heart: no murmurs, gallops, or rubs abd: soft, nontender; BS normoactive; no abdominal masses, tenderness, organomegaly rectal: deferred ext: no cynanosis, clubbing, edema skeletal: no deformities neuro: oriented x 3; no focal abnormalities    Impression & Recommendations:  Problem # 1:  DYSPHAGIA  UNSPECIFIED (ICD-787.20) Symptoms could be due to a peptic stricture.  Candida esophagitis should be ruled out.  Recommendations #1 upper endoscopy with dilatation as indicated  Risks, alternatives, and complications of the procedure, including bleeding, perforation, and possible  need for surgery, were explained to the patient.  Patient's questions were answered.  Orders: EGD (EGD)  Problem # 2:  SPECIAL SCREENING FOR MALIGNANT NEOPLASMS COLON (ICD-V76.51) Plan screening colonoscopy  Problem # 3:  HIV DISEASE (ICD-042) Assessment: Comment Only  Patient Instructions: 1)  Your EGD is scheduled on 02/16/2010 at 11am,You will arrive on the 4th floor at 10am 2)  You will need to call back to schedule your screening colonoscopy at your convenience at that time you will be sceduled for a previsit with a nurse to get all instructions and sign all paperwork 3)  The medication list was reviewed and reconciled.  All changed / newly prescribed medications were explained.  A complete medication list was provided to the patient / caregiver. 4)  Conscious Sedation brochure given.  5)  Upper Endoscopy with Dilatation brochure given.

## 2010-06-07 NOTE — Letter (Signed)
Summary: EGD Instructions  [facility]  [place], [place] [address]   Phone: [phone]  Fax: [phone]       Jeffrey [name]    07-01-1945    MRN: [id number]       Procedure Day /Date:WEDNESDAY 02/16/2010     Arrival Time: 10AM     Procedure Time:11AM     Location of Procedure:                    X  [facility] (4th Floor)   PREPARATION FOR ENDOSCOPY/DIL   On10/04/2010 THE DAY OF THE PROCEDURE:  1.   No solid foods, milk or milk products are allowed after midnight the night before your procedure.  2.   Do not drink anything colored red or purple.  Avoid juices with pulp.  No orange juice.  3.  You may drink clear liquids until9AM, which is 2 hours before your procedure.                                                                                                CLEAR LIQUIDS INCLUDE: Water Jello Ice Popsicles Tea (sugar ok, no milk/cream) Powdered fruit flavored drinks Coffee (sugar ok, no milk/cream) Gatorade Juice: apple, white grape, white cranberry  Lemonade Clear bullion, consomm, broth Carbonated beverages (any kind) Strained chicken noodle soup Hard Candy   MEDICATION INSTRUCTIONS  Unless otherwise instructed, you should take regular prescription medications with a small sip of water as early as possible the morning of your procedure.              OTHER INSTRUCTIONS  You will need a responsible adult at least 63 years of age to accompany you and drive you home.   This person must remain in the waiting room during your procedure.  Wear loose fitting clothing that is easily removed.  Leave jewelry and other valuables at home.  However, you may wish to bring a book to read or an iPod/MP3 player to listen to music as you wait for your procedure to start.  Remove all body piercing jewelry and leave at home.  Total time from sign-in until discharge is approximately 2-3 hours.  You should go home directly  after your procedure and rest.  You can resume normal activities the day after your procedure.  The day of your procedure you should not:   Drive   Make legal decisions   Operate machinery   Drink alcohol   Return to work  You will receive specific instructions about eating, activities and medications before you leave.    The above instructions have been reviewed and explained to me by   _______________________    I fully understand and can verbalize these instructions _____________________________ Date _________

## 2010-06-07 NOTE — Progress Notes (Signed)
Summary: [place]pt assist meds arrived for Jan  Phone Note Refill Request      Prescriptions: [name] 200-50 MG TABS (LOPINAVIR-RITONAVIR) four tabs once daily  #120 x 0   Entered by:   Canary Brim  BS,CPht II,MPH   Authorized by:   [name] MD   Signed by:   Canary Brim  BS,CPht II,MPH on 05/24/2009   Method used:   Samples Given   RxID:   [id number] TRUVADA 200-300 MG TABS (EMTRICITABINE-TENOFOVIR) one tab once daily  #30 x 0   Entered by:   Canary Brim  BS,CPht II,MPH   Authorized by:   [name] MD   Signed by:   Canary Brim  BS,CPht II,MPH on 05/24/2009   Method used:   Samples Given   RxID:   [id number]   Patient Assist Medication Verification: Medication: Truvada Lot# [id number] Exp Date:06 2014 Tech approval:MLD                Patient Assist Medication Verification: Medication:[name] 200/73m LBSJ#[id number] Date:28 Dec 2011 Tech approval:MLD Call placed to patient with message that assistance medications are ready for pick-up. MCanary Brim BS,CPht II,MPH  May 24, 2009 9:52 AM

## 2010-06-07 NOTE — Progress Notes (Signed)
Summary: [facility]/patient assist meds arrived for Feb  Phone Note Refill Request      Prescriptions: [name] 200-50 MG TABS (LOPINAVIR-RITONAVIR) four tabs once daily  #120 x 0   Entered by:   Sherrie George [facility])   Authorized by:   [name] MD   Signed by:   Sherrie George [facility]) on 06/18/2009   Method used:   Samples Given   RxID:   [id number] TRUVADA 200-300 MG TABS (EMTRICITABINE-TENOFOVIR) one tab once daily  #30 x 0   Entered by:   Sherrie George [facility])   Authorized by:   [name] MD   Signed by:   Sherrie George [facility]) on 06/18/2009   Method used:   Samples Given   RxID:   [id number]   Patient Assist Medication Verification: Medication:[name] Lot# [id number] Exp Date:03-04-2012 Tech approval:pw  Patient Assist Medication Verification: Medication: Truvada Lot# [id number] Exp Date: 11-2012 Tech approval: pw Call placed to patient with message that assistance medications are ready for pick-up. Spoke with patient. Sherrie George [facility])  June 18, 2009 2:28 PM

## 2010-06-07 NOTE — Procedures (Signed)
Summary: Upper Endoscopy  Patient: Jeffrey [name] Note: All result statuses are Final unless otherwise noted.  Tests: (1) Upper Endoscopy (EGD)   EGD Upper Endoscopy       [facility] [employer] [facility] [employer]     [place], [place]  [address]           ENDOSCOPY PROCEDURE REPORT           PATIENT:  Jeffrey [name], Jeffrey [name]  MR#:  [id number]     BIRTHDATE:  29-Mar-1946, 64 yrs. old  GENDER:  male           ENDOSCOPIST:  Sandy Salaam. Deatra Ina, MD     Referred by:           PROCEDURE DATE:  02/25/2010     PROCEDURE:  EGD, diagnostic, Maloney Dilation of Esophagus     ASA CLASS:  Class II     INDICATIONS:  dysphagia           MEDICATIONS:   Fentanyl 50 mcg IV, Versed 5 mg IV, glycopyrrolate     (Robinal) 0.2 mg IV, 0.6cc simethancone 0.6 cc PO     TOPICAL ANESTHETIC:  Exactacain Spray           DESCRIPTION OF PROCEDURE:   After the risks benefits and     alternatives of the procedure were thoroughly explained, informed     consent was obtained.  The LB [id number] endoscope was     introduced through the mouth and advanced to the third portion of     the duodenum, without limitations.  The instrument was slowly     withdrawn as the mucosa was fully examined.     <<PROCEDUREIMAGES>>           A stricture was found at the gastroesophageal junction (see     image1). Early stricture GE junction Dilation with maloney dilator     75m Minimal resistance; no heme  Otherwise the examination was     normal.    Retroflexed views revealed no abnormalities.    The     scope was then withdrawn from the patient and the procedure     completed.           COMPLICATIONS:  None           ENDOSCOPIC IMPRESSION:     1) Stricture at the gastroesophageal junction - s/p maloney     dilitation     2) Otherwise normal examination     RECOMMENDATIONS:     1) repeat dilatation  as needed           REPEAT EXAM:  No           ______________________________     RSandy Salaam KDeatra Ina MD           CC:   JMichel Bickers MD           n.     eLorrin Mais   RSandy Salaam Kaplan at 02/25/2010 11:40 AM           CCarma Leaven [id number] Note: An exclamation mark (!) indicates a result that was not dispersed into the flowsheet. Document Creation Date: 02/25/2010 11:40 AM _______________________________________________________________________  (1) Order result status: Final Collection or observation date-time: 02/25/2010 11:34 Requested date-time:  Receipt date-time:  Reported date-time:  Referring Physician:   Ordering Physician: RErskine Emery([phone] Specimen Source:  Source: ETawanna CoolerOrder Number: [id number] site:

## 2010-06-07 NOTE — Letter (Signed)
Summary: New Patient letter  [facility]  [address] [place], [place] [address]   Phone: [phone]  Fax: [phone]       12/21/2009 MRN: [id number]  Jeffrey [name] [place], [place]  [address]  Dear Mr. Shorty,  Welcome to the Gastroenterology Division at [facility].    You are scheduled to see Dr.  Erskine Emery on October , 2011 at 9:00am on the 3rd floor at [facility], [place] [facility].  We ask that you try to arrive at our office 15 minutes prior to your appointment time to allow for check-in.  We would like you to complete the enclosed self-administered evaluation form prior to your visit and bring it with you on the day of your appointment.  We will review it with you.  Also, please bring a complete list of all your medications or, if you prefer, bring the medication bottles and we will list them.  Please bring your insurance card so that we may make a copy of it.  If your insurance requires a referral to see a specialist, please bring your referral form from your primary care physician.  Co-payments are due at the time of your visit and may be paid by cash, check or credit card.     Your office visit will consist of a consult with your physician (includes a physical exam), any laboratory testing he/she may order, scheduling of any necessary diagnostic testing (e.g. x-ray, ultrasound, CT-scan), and scheduling of a procedure (e.g. Endoscopy, Colonoscopy) if required.  Please allow enough time on your schedule to allow for any/all of these possibilities.    If you cannot keep your appointment, please call [phone] to cancel or reschedule prior to your appointment date.  This allows [place] the opportunity to schedule an appointment for another patient in need of care.  If you do not cancel or reschedule by 5 p.m. the business day prior to your appointment date, you will be charged a $50.00 late cancellation/no-show fee.    Thank you for  choosing [facility] for your medical needs.  We appreciate the opportunity to care for you.  Please visit [place] at our website  to learn more about our practice.                     Sincerely,                                                             The Gastroenterology Division

## 2010-06-07 NOTE — Letter (Signed)
[facility] Appt[facility]  [facility]: Appt. Notice   Imported By: Bonner Puna 05/19/2009 15:02:23  _____________________________________________________________________  External Attachment:    Type:   Image     Comment:   External Document

## 2010-06-07 NOTE — Miscellaneous (Signed)
Summary: clinical update/ryan white ADAP app completed  Clinical Lists Changes  Observations: Added new observation of PCTFPL: 84.09  (06/25/2009 9:45) Added new observation of HOUSEINCOME: 9107  (06/25/2009 9:45) Added new observation of FINASSESSDT: 06/25/2009  (06/25/2009 9:45)

## 2010-06-07 NOTE — Progress Notes (Signed)
Summary: NCADAP/pt assist meds arrived for May  Phone Note Refill Request      Prescriptions: [name] 200-50 MG TABS (LOPINAVIR-RITONAVIR) four tabs once daily  #120 x 0   Entered by:   Canary Brim  BS,CPht II,MPH   Authorized by:   [name] MD   Signed by:   Canary Brim  BS,CPht II,MPH on 09/08/2009   Method used:   Samples Given   RxID:   [id number] TRUVADA 200-300 MG TABS (EMTRICITABINE-TENOFOVIR) one tab once daily  #30 x 0   Entered by:   Canary Brim  BS,CPht II,MPH   Authorized by:   [name] MD   Signed by:   Canary Brim  BS,CPht II,MPH on 09/08/2009   Method used:   Samples Given   RxID:   [id number]   Patient Assist Medication Verification: Medication: [name] 200/82m Lot# [id number] Date:31 Dec 2011 Tech approval:MLD                Patient Assist Medication Verification: Medication:truvada Lot# [id number] Date:10 2014 Tech approval:MLD Call placed to patient with message that assistance medications are ready for pick-up. MCanary Brim BS,CPht II,MPH  Sep 08, 2009 4:13 PM

## 2010-06-07 NOTE — Letter (Signed)
Summary: Results Letter  [facility]  [place], [place] [address]   Phone: [phone]  Fax: [phone]        February 09, 2010 MRN: [id number]    Jeffrey [name] [address] [place], [place]  [address]    Dear [name],  It is my pleasure to have treated you recently as a new patient in my office. I appreciate your confidence and the opportunity to participate in your care.  Since I do have a busy inpatient endoscopy schedule and office schedule, my office hours vary weekly. I am, however, available for emergency calls everyday through my office. If I am not available for an urgent office appointment, another one of our gastroenterologist will be able to assist you.  My well-trained staff are prepared to help you at all times. For emergencies after office hours, a physician from our Gastroenterology section is always available through my 24 hour answering service  Once again I welcome you as a new patient and I look forward to a happy and healthy relationship             Sincerely,  Inda Castle MD  This letter has been electronically signed by your physician.  Appended Document: Results Letter letter mailed

## 2010-06-07 NOTE — Progress Notes (Signed)
Summary: NCADAP/pt assist meds arrived for Mar  Phone Note Refill Request      Prescriptions: [name] 200-50 MG TABS (LOPINAVIR-RITONAVIR) four tabs once daily  #120 x 0   Entered by:   Canary Brim  BS,CPht II,MPH   Authorized by:   [name] MD   Signed by:   Canary Brim  BS,CPht II,MPH on 07/27/2009   Method used:   Samples Given   RxID:   [id number] TRUVADA 200-300 MG TABS (EMTRICITABINE-TENOFOVIR) one tab once daily  #30 x 0   Entered by:   Canary Brim  BS,CPht II,MPH   Authorized by:   [name] MD   Signed by:   Canary Brim  BS,CPht II,MPH on 07/27/2009   Method used:   Samples Given   RxID:   [id number]   Patient Assist Medication Verification: Medication: Cathe Mons XIV#[id number] Exp Date:09 2014 Tech approval:MLD                Patient Assist Medication Verification: Medication:[name] 200/35m LBOB#[id number] Date:17 Feb 2012 Tech approval:MLD Call placed to patient with message that assistance medications are ready for pick-up. MCanary Brim BS,CPht II,MPH  July 27, 2009 2:09 PM

## 2010-06-07 NOTE — Consult Note (Signed)
Summary: [facility]   Imported By: Bonner Puna 05/19/2009 15:03:09  _____________________________________________________________________  External Attachment:    Type:   Image     Comment:   External Document

## 2010-06-07 NOTE — Assessment & Plan Note (Signed)
Summary: F/U APPT/VS   CC:  follow-up visit.  History of Present Illness: Jeffrey [name] is in for his routine visit.  He lost his job at [employer] late last year and therefore lost his medical insurance.  As a result he was unable to complete his gastroenterology referral. he continues to be bothered by dysphasia with solid foods but says it is actually slightly better.  He will occasionally have to cough solid food back up. He has no heartburn or indigestion but continues to take Prilosec as instructed.  He denies missing a single dose of his Truvada or [name].  He is not in a relationship and has not been sexually active.  Preventive Screening-Counseling & Management  Alcohol-Tobacco     Alcohol drinks/day: 0     Smoking Status: never     Passive Smoke Exposure: no  Caffeine-Diet-Exercise     Caffeine use/day: yes     Does Patient Exercise: no  Hep-HIV-STD-Contraception     HIV Risk: no risk noted  Safety-Violence-Falls     Seat Belt Use: yes  Comments: declined condoms      Sexual History:  n/a.        Drug Use:  never.     Current Allergies (reviewed today): ! * VIRAMUNE ! * SUSTIVA Vital Signs:  Patient profile:   65 year old male Height:      70 inches (177.80 cm) Weight:      145.5 pounds (66.14 kg) BMI:     20.95 Temp:     98.2 degrees F (36.78 degrees C) oral Pulse rate:   71 / minute BP sitting:   143 / 84  (left arm) Cuff size:   regular  Vitals Entered By: Lorne Skeens RN (August 12, 2009 3:00 PM) CC: follow-up visit Is Patient Diabetic? No Pain Assessment Patient in pain? no      Nutritional Status BMI of 19 -24 = normal Nutritional Status Detail appetite "great"  Have you ever been in a relationship where you felt threatened, hurt or afraid?No   Does patient need assistance? Functional Status Self care Ambulation Normal Comments no missed doses of rxes   Physical Exam  General:  alert and well-nourished.   Mouth:  pharynx pink and  moist, no erythema, and no exudates.  fair dentition.   Lungs:  normal breath sounds.  no crackles and no wheezes.   Heart:  normal rate, regular rhythm, and no murmur.   Abdomen:  soft, non-tender, normal bowel sounds, and no masses.             Prevention For Positives: 08/12/2009   Safe sex practices discussed with patient. Condoms offered.                             Impression & Recommendations:  Problem # 1:  HIV DISEASE (ICD-042) His HIV infection remains under good control and his adherence is excellent.  I will not make any changes today Diagnostics Reviewed:  HIV: CDC-defined AIDS (09/19/2007)   CD4: 320 (07/22/2009)   WBC: 4.5 (07/21/2009)   Hgb: 13.0 (07/21/2009)   HCT: 39.6 (07/21/2009)   Platelets: 235 (07/21/2009) HIV genotype: REPORT (02/20/2006)   HIV-1 RNA: 34 (07/21/2009)   HBSAg: No (07/02/2006)  Problem # 2:  DYSPHAGIA UNSPECIFIED (ICD-787.20) I suspect that he has a peptic esophageal stricture causing his dysphasia.  I'll have him continue Prilosec and try to arrange gastroenterology referral as soon as possible.  Orders: Est. Patient Level III (78978)  Other Orders: Future Orders: T-CD4SP ([facility]) (CD4SP) ... 02/08/2010 T-HIV Viral Load [phone]) ... 02/08/2010  Patient Instructions: 1)  Please schedule a follow-up appointment in 6 months.            Prevention For Positives: 08/12/2009   Safe sex practices discussed with patient. Condoms offered.

## 2010-06-07 NOTE — Progress Notes (Signed)
Summary: ncadap meds arrived for Sept-left msg for pt to call office  Phone Note Refill Request      Prescriptions: PRILOSEC OTC 20 MG TBEC (OMEPRAZOLE MAGNESIUM) Take 1 tablet by mouth once a day  #30 x 0   Entered by:   Canary Brim  BS,CPht II,MPH   Authorized by:   [name] MD   Signed by:   Canary Brim  BS,CPht II,MPH on 01/31/2010   Method used:   Samples Given   RxID:   [id number] [name] 200-50 MG TABS (LOPINAVIR-RITONAVIR) four tabs once daily  #120 Not Spec x 0   Entered by:   Canary Brim  BS,CPht II,MPH   Authorized by:   [name] MD   Signed by:   Canary Brim  BS,CPht II,MPH on 01/31/2010   Method used:   Samples Given   RxID:   [id number] TRUVADA 200-300 MG TABS (EMTRICITABINE-TENOFOVIR) one tab once daily  #30 Not Speci x 0   Entered by:   Canary Brim  BS,CPht II,MPH   Authorized by:   [name] MD   Signed by:   Canary Brim  BS,CPht II,MPH on 01/31/2010   Method used:   Samples Given   RxID:   [id number]  Patient Assist Medication Verification: Medication name: Noah Charon RX # [id number] Tech approval:mld  Patient Assist Medication Verification: Medication name:omeprazole 44m RX # [id number] approval:mld  Patient Assist Medication Verification: Medication name:kaletra 200/582mRX # [id number] approval:mld Call placed to patient with message that assistance medications are ready for pick-up. Left message for patient to call the office MaCanary BrimBS,CPht II,MPH  January 31, 2010 11:11 AM

## 2010-06-08 ENCOUNTER — Encounter: Payer: Self-pay | Admitting: Internal Medicine

## 2010-06-08 ENCOUNTER — Ambulatory Visit: Payer: Self-pay | Admitting: Internal Medicine

## 2010-06-08 ENCOUNTER — Ambulatory Visit: Admit: 2010-06-08 | Payer: Self-pay | Admitting: Internal Medicine

## 2010-06-08 DIAGNOSIS — M7989 Other specified soft tissue disorders: Secondary | ICD-10-CM

## 2010-06-09 NOTE — Letter (Signed)
Summary: Discharge Summary  Discharge Summary   Imported By: Bonner Puna 05/23/2010 09:35:04  _____________________________________________________________________  External Attachment:    Type:   Image     Comment:   External Document

## 2010-06-09 NOTE — Assessment & Plan Note (Signed)
Summary: F/U OV/VS   Primary Provider:  n/a  CC:  Hospitalized for "muscle pull in his left upper chest wall."   Lower portion of arms and legs are swollen in the AM but goes down throughout the day.  Pt. is concened about this swelling .  History of Present Illness: Jeffrey [name] is in for his routine visit.  He saw Dr. Deatra Ina recently who repeated his upper endoscopy.  As suspected, he did have a recurrent esophageal stricture that was dilated.  He is now swallowing much more easily and has regained 9 pounds.  Just before New Year's he developed some acute chest pain that radiated into his right arm.  He was hospitalized and a cardiac workup was negative.  He was told he probably had a musculoskeletal strain.  The pain has not recurred and he has not required any pain medication.  Over the past month he has also noted some morning the swelling of his hands and feet.  It is present when he first wakes up and takes about an hour to resolve.  He notes a slight amount of stiffness in his fingers.  He has never had anything like this before and it does not occur during the day.  He is not on any new medications.  Preventive Screening-Counseling & Management  Alcohol-Tobacco     Alcohol drinks/day: 0     Smoking Status: never     Passive Smoke Exposure: no  Caffeine-Diet-Exercise     Caffeine use/day: yes     Does Patient Exercise: no  Hep-HIV-STD-Contraception     HIV Risk: no risk noted     HIV Risk Counseling: not indicated-no HIV risk noted  Safety-Violence-Falls     Seat Belt Use: yes  Comments: declined condoms      Sexual History:  n/a.        Drug Use:  never.     Prior Medication List:  TRUVADA 200-300 MG TABS (EMTRICITABINE-TENOFOVIR) one tab once daily [name] 200-50 MG TABS (LOPINAVIR-RITONAVIR) four tabs once daily PRILOSEC OTC 20 MG TBEC (OMEPRAZOLE MAGNESIUM) Take 1 tablet by mouth once a day   Current Allergies (reviewed today): ! [facility] ! * SUSTIVA Social  History: Drug Use:  never  Vital Signs:  Patient profile:   65 year old male Height:      70 inches (177.80 cm) Weight:      154 pounds (70 kg) BMI:     22.18 Temp:     97.9 degrees F (36.61 degrees C) oral Pulse rate:   67 / minute BP sitting:   155 / 94  (left arm) Cuff size:   regular  Vitals Entered By: Jeffrey Skeens RN (May 17, 2010 9:22 AM) CC: Hospitalized for "muscle pull in his left upper chest wall."   Lower portion of arms and legs are swollen in the AM but goes down throughout the day.  Pt. is concened about this swelling  Is Patient Diabetic? No Pain Assessment Patient in pain? no      Nutritional Status BMI of 19 -24 = normal Nutritional Status Detail appetite "good"  Have you ever been in a relationship where you felt threatened, hurt or afraid?No   Does patient need assistance? Functional Status Self care Ambulation Normal Comments no missed doses of HIV rxes   Physical Exam  General:  alert and well-nourished.  he has gained 9 pounds. Mouth:  pharynx pink and moist, no erythema, and no exudates.   poor dentition.   Lungs:  normal breath sounds.  no crackles and no wheezes.   Heart:  normal rate, regular rhythm, and no murmur.   Msk:  normal ROM, no joint tenderness, no joint swelling, no joint warmth, and no redness over joints.  there is no swelling or edema of his hands or feet.    Impression & Recommendations:  Problem # 1:  HIV DISEASE (ICD-042) His infection remains under good control.  I will not make any changes today. Diagnostics Reviewed:  HIV: CDC-defined AIDS (09/19/2007)   CD4: 300 (04/11/2010)   WBC: 4.5 (07/21/2009)   Hgb: 13.0 (07/21/2009)   HCT: 39.6 (07/21/2009)   Platelets: 235 (07/21/2009) HIV genotype: REPORT (02/20/2006)   HIV-1 RNA: 29 (04/08/2010)   HBSAg: No (07/02/2006)  Problem # 2:  DYSPHAGIA UNSPECIFIED (ICD-787.20) His dysphagia was due to recurrent esophageal stricture which has now been dilated.  I will continue  his proton pump inhibitor.  Problem # 3:  CHEST PAIN (ICD-786.50) He is to call me if he has any recurrent chest pain. Orders: Est. Patient Level IV (81448)  Problem # 4:  SWELLING OF LIMB (ICD-729.81) I have no idea what is causing his recent hand and foot swelling each morning.  He has nothing on exam to suggest an abnormality currently.  It may be related to his recent weight gain and slightly elevated blood pressure but I will repeat routine lab work today and see him back in 3 weeks. Orders: Est. Patient Level IV (18563)  Other Orders: T-Comprehensive Metabolic Panel ([id number]) T-CBC w/Diff (58850-27741) T-Urinalysis (28786-76720)  Patient Instructions: 1)  Please schedule a follow-up appointment in 3 weeks.

## 2010-06-15 NOTE — Assessment & Plan Note (Signed)
Summary: Office Visit (Infectious Disease)    Primary Provider:  n/a  CC:  follow-up visit and swelling in feet and hands unchanged since last OV.  Whenever he lays down the swelling happens.  History of Present Illness: Jeffrey [name] is in for his follow-up visit.  He continues to notice swelling of his hands and feet each morning upon awakening.  He also noted at one time when he fell asleep on a couch during the day. The swelling and mild discomfort subsides in about an hour hour and a half after awakening.  This occurs spontaneously.  He has not noticed any problems during the day.  He believes this started after he began taking Prilosec recently.  He is not having any further problems with chest pain or swallowing difficulties.  He has not missed any of his HIV medications.  Preventive Screening-Counseling & Management  Alcohol-Tobacco     Alcohol drinks/day: 0     Smoking Status: never     Passive Smoke Exposure: no  Caffeine-Diet-Exercise     Caffeine use/day: yes     Does Patient Exercise: no  Hep-HIV-STD-Contraception     HIV Risk: no risk noted     HIV Risk Counseling: not indicated-no HIV risk noted  Safety-Violence-Falls     Seat Belt Use: yes  Comments: declined condoms      Sexual History:  n/a.        Drug Use:  never.     Current Allergies (reviewed today): ! * VIRAMUNE ! * SUSTIVA Vital Signs:  Patient profile:   65 year old male Height:      70 inches (177.80 cm) Weight:      154.5 pounds (70.23 kg) BMI:     22.25 Temp:     98.0 degrees F (36.67 degrees C) oral Pulse rate:   67 / minute BP sitting:   143 / 84  (left arm) Cuff size:   large  Vitals Entered By: Lorne Skeens RN (June 08, 2010 9:22 AM) CC: follow-up visit, swelling in feet and hands unchanged since last OV.  Whenever he lays down the swelling happens Is Patient Diabetic? No Pain Assessment Patient in pain? no      Nutritional Status BMI of 19 -24 = normal Nutritional Status  Detail appetite "good"  Have you ever been in a relationship where you felt threatened, hurt or afraid?No   Does patient need assistance? Functional Status Self care Ambulation Normal Comments no missed doses   Physical Exam  General:  alert and well-nourished.  he has gained 9 pounds. Mouth:  pharynx pink and moist, no erythema, and no exudates.   poor dentition.   Lungs:  normal breath sounds.  no crackles and no wheezes.   Heart:  normal rate, regular rhythm, and no murmur.   Msk:  normal ROM, no joint tenderness, no joint swelling, no joint warmth, and no redness over joints.  there is no swelling or edema of his hands or feet.    Impression & Recommendations:  Problem # 1:  SWELLING OF LIMB (ICD-729.81) I'm not sure what is causing this transient a.m. swelling of his hands and feet.  I have never seen this with proton pump inhibitors but it is reasonable to have him hold the Prilosec for one week and see what happens.  He will do this and call me next week.  His complete metabolic panel, CBC and UA were unremarkable. Orders: Est. Patient Level III ([address])  Problem # 2:  HIV DISEASE (ICD-042) I will continue his current regimen and have him follow-up in 3 months.  Other Orders: Future Orders: T-CD4SP ([facility]) (CD4SP) ... 09/06/2010 T-HIV Viral Load [phone]) ... 09/06/2010 T-Comprehensive Metabolic Panel ([id number]) ... 09/06/2010 T-CBC w/Diff ([id number]) ... 09/06/2010  Patient Instructions: 1)  Please schedule a follow-up appointment in 3 months.

## 2010-06-20 ENCOUNTER — Telehealth: Payer: Self-pay | Admitting: Internal Medicine

## 2010-06-27 ENCOUNTER — Encounter (INDEPENDENT_AMBULATORY_CARE_PROVIDER_SITE_OTHER): Payer: Self-pay | Admitting: *Deleted

## 2010-06-28 ENCOUNTER — Encounter: Payer: Self-pay | Admitting: Internal Medicine

## 2010-07-05 NOTE — Miscellaneous (Signed)
  Clinical Lists Changes

## 2010-07-11 ENCOUNTER — Telehealth: Payer: Self-pay | Admitting: Internal Medicine

## 2010-07-19 LAB — POCT CARDIAC MARKERS
CKMB, poc: 1.3 ng/mL (ref 1.0–8.0)
Myoglobin, poc: 70.7 ng/mL (ref 12–200)
Myoglobin, poc: 81.2 ng/mL (ref 12–200)
Troponin i, poc: <0.05 ng/mL (ref 0.00–0.09)
Troponin i, poc: <0.05 ng/mL (ref 0.00–0.09)

## 2010-07-19 LAB — CBC
HCT: 37.8 % — ABNORMAL LOW (ref 39.0–52.0)
Hemoglobin: 12.4 g/dL — ABNORMAL LOW (ref 13.0–17.0)
MCH: 32.9 pg (ref 26.0–34.0)
MCH: 33.2 pg (ref 26.0–34.0)
MCHC: 32.8 g/dL (ref 30.0–36.0)
MCHC: 33.5 g/dL (ref 30.0–36.0)
MCV: 99 fL (ref 78.0–100.0)
Platelets: 156 10*3/uL (ref 150–400)
RDW: 14.3 % (ref 11.5–15.5)
WBC: 6.3 10*3/uL (ref 4.0–10.5)

## 2010-07-19 LAB — BASIC METABOLIC PANEL
BUN: 19 mg/dL (ref 6–23)
CO2: 26 mEq/L (ref 19–32)
Calcium: 8.2 mg/dL — ABNORMAL LOW (ref 8.4–10.5)
Calcium: 8.3 mg/dL — ABNORMAL LOW (ref 8.4–10.5)
Creatinine, Ser: 1.16 mg/dL (ref 0.4–1.5)
GFR calc Af Amer: >60 mL/min (ref >60)
GFR calc Af Amer: >60 mL/min (ref >60)
GFR calc non Af Amer: 55 mL/min — ABNORMAL LOW (ref >60)
Glucose, Bld: 119 mg/dL — ABNORMAL HIGH (ref 70–99)
Glucose, Bld: 93 mg/dL (ref 70–99)
Sodium: 140 mEq/L (ref 135–145)

## 2010-07-19 LAB — COMPREHENSIVE METABOLIC PANEL
Alkaline Phosphatase: 103 U/L (ref 39–117)
CO2: 27 mEq/L (ref 19–32)
Chloride: 105 mEq/L (ref 96–112)
Glucose, Bld: 89 mg/dL (ref 70–99)
Potassium: 4.1 mEq/L (ref 3.5–5.1)
Sodium: 138 mEq/L (ref 135–145)
Total Bilirubin: 0.7 mg/dL (ref 0.3–1.2)
Total Protein: 6.8 g/dL (ref 6.0–8.3)

## 2010-07-19 LAB — D-DIMER, QUANTITATIVE: D-Dimer, Quant: 0.24 ug/mL-FEU (ref 0.00–0.48)

## 2010-07-19 LAB — DIFFERENTIAL
Basophils Relative: 0 % (ref 0–1)
Eosinophils Absolute: 0 10*3/uL (ref 0.0–0.7)
Eosinophils Relative: 1 % (ref 0–5)
Lymphocytes Relative: 21 % (ref 12–46)
Neutrophils Relative %: 71 % (ref 43–77)

## 2010-07-19 LAB — CARDIAC PANEL(CRET KIN+CKTOT+MB+TROPI)
CK, MB: 1.6 ng/mL (ref 0.3–4.0)
Total CK: 51 U/L (ref 7–232)
Total CK: 58 U/L (ref 7–232)

## 2010-07-19 LAB — CK TOTAL AND CKMB (NOT AT ARMC)
Relative Index: INVALID (ref 0.0–2.5)
Total CK: 97 U/L (ref 7–232)

## 2010-07-19 LAB — T-HELPER CELL (CD4) - (RCID CLINIC ONLY)
CD4 % Helper T Cell: 17 % — ABNORMAL LOW (ref 33–55)
CD4 T Cell Abs: 300 uL — ABNORMAL LOW (ref 400–2700)

## 2010-07-19 LAB — PROTIME-INR: Prothrombin Time: 11.1 seconds — ABNORMAL LOW (ref 11.6–15.2)

## 2010-07-19 LAB — BRAIN NATRIURETIC PEPTIDE: Pro B Natriuretic peptide (BNP): 34 pg/mL (ref 0.0–100.0)

## 2010-07-19 NOTE — Progress Notes (Signed)
  Phone Note Call from Patient   Caller: Patient Call For: [name] MD Summary of Call: he has stopped the prilosec but continues to have swelling of hands or feet anytime he lays down. wants to know what he should do. states "there is a malfunction somewhere". no GI symptoms off the prilosec. told him I will call him back when I hear from md  Initial call taken by: Elige Radon RN,  June 20, 2010 2:38 PM  Follow-up for Phone Call        I left a message with someone at his home number for him to call me. Follow-up by: [name] MD,  June 22, 2010 11:32 AM  Additional Follow-up for Phone Call Additional follow up Details #1::        Jeffrey [name] called back and left a message for me but when I call him back and leave a message and will be out of town until February 29.  I asked him to call here if he develops new problems in my absence.

## 2010-07-19 NOTE — Progress Notes (Signed)
  Phone Note Call from Patient   Caller: Patient Summary of Call: states he is waiting to hear from md. held the prilosec but that did not make any difference. still has same amount of swelling in hands & feet. when the swelling goes down, about 1 hour after getting up, his hands hurt. told him I will send this to md again [phone]. he did not want to come back into the office Initial call taken by: Elige Radon RN,  July 11, 2010 11:39 AM  Follow-up for Phone Call        The cause of his very unusual pattern of swelling is unclear. It is not getting worse and does not interfer a great deal with his day to day activities so we agreed to continue observation for now. He knows to call if he gets worse before his next appointment. Follow-up by: [name] MD,  July 14, 2010 5:36 PM

## 2010-07-22 ENCOUNTER — Encounter (INDEPENDENT_AMBULATORY_CARE_PROVIDER_SITE_OTHER): Payer: Self-pay | Admitting: *Deleted

## 2010-07-26 NOTE — Miscellaneous (Signed)
Summary: ADAP UPDATE  Clinical Lists Changes  Observations: Added new observation of AIDSDAP: Pending-APPROVAL 2012 (07/22/2010 15:53) Added new observation of PCTFPL: 165.21  (07/22/2010 15:53) Added new observation of INCOMESOURCE: WAGES AND SSI  (07/22/2010 15:53) Added new observation of [place]: [address]  (07/22/2010 15:53) Added new observation of FINASSESSDT: 07/22/2010  (07/22/2010 15:53)

## 2010-08-12 LAB — T-HELPER CELL (CD4) - (RCID CLINIC ONLY): CD4 % Helper T Cell: 17 % — ABNORMAL LOW (ref 33–55)

## 2010-08-16 LAB — T-HELPER CELL (CD4) - (RCID CLINIC ONLY)
CD4 % Helper T Cell: 16 % — ABNORMAL LOW (ref 33–55)
CD4 T Cell Abs: 250 uL — ABNORMAL LOW (ref 400–2700)

## 2010-08-17 ENCOUNTER — Encounter: Payer: Self-pay | Admitting: Licensed Clinical Social Worker

## 2010-08-17 ENCOUNTER — Other Ambulatory Visit: Payer: Self-pay | Admitting: Licensed Clinical Social Worker

## 2010-08-17 DIAGNOSIS — B20 Human immunodeficiency virus [HIV] disease: Secondary | ICD-10-CM

## 2010-08-17 MED ORDER — EMTRICITABINE-TENOFOVIR DF 200-300 MG PO TABS: 1.0000 | ORAL_TABLET | Freq: Every day | ORAL | Status: DC

## 2010-08-17 MED ORDER — LOPINAVIR-RITONAVIR 200-50 MG PO TABS: 2.0000 | ORAL_TABLET | Freq: Two times a day (BID) | ORAL | Status: DC

## 2010-08-23 ENCOUNTER — Other Ambulatory Visit: Payer: Self-pay | Admitting: *Deleted

## 2010-08-23 DIAGNOSIS — B20 Human immunodeficiency virus [HIV] disease: Secondary | ICD-10-CM

## 2010-08-23 MED ORDER — OMEPRAZOLE 20 MG PO CPDR: 20.0000 mg | DELAYED_RELEASE_CAPSULE | Freq: Every day | ORAL | Status: DC

## 2010-09-23 NOTE — Discharge Summary (Signed)
Jeffrey [name], [name]:  [id number]   MEDICAL RECORD NO.:  [id number]          PATIENT TYPE:  INP   LOCATION:  3016                         FACILITY:  [facility]   PHYSICIAN:  Carolin Guernsey, MD         DATE OF BIRTH:  Oct 12, 1945   DATE OF ADMISSION:  11/04/2005  DATE OF DISCHARGE:  11/09/2005                                 DISCHARGE SUMMARY   DISCHARGE DIAGNOSES:  1. Pneumonia.  2. Human immunodeficiency virus/acquired immunodeficiency syndrome.  3. Normocytic anemia.  4. Hypoalbuminemia.  5. Hyponatremia.  6. History of esophageal candidiasis.  7. History of hypertension.   DISCHARGE MEDICATIONS:  1. Bactrim double strength, two tablets by mouth every 8 hours for 17 days      to end on 11/27/2005, then one tablet by mouth every day starting on      11/28/2005.  2. Avelox 400 mg tablet, one tablet by mouth every day for 5 days until      11/14/2005.  3. Azithromycin 1,200 mg per mouth q. week.  4. Multivitamin one tablet p.o. q. day.  5. Folate 1 mg per mouth q. day.  6. Omeprazole 20 mg per mouth q. day.  7. Ensure supplement with each meal.  8. Home oxygen at 2 liters nasal cannula.   DISPOSITION AND FOLLOWUP:  The patient was discharged from the floor after  dyspnea improved.  He was provided with a prescription for home oxygen to be  used for dyspnea on exertion.  The patient will followup with Dr. Michel [name] in the Infectious Disease Clinic on November 20, 2005, at 3:15 p.m.  At  this time the patient needs a repeat chest x-ray to evaluate for improvement  of his pneumonia as well as followup for AFB cultures which are pending.   PROCEDURES PERFORMED:  1. Chest x-ray on 11/04/2005, showed patchy opacity throughout both lungs.  2. Chest x-ray on 11/07/2005 showed persistent lower lobar opacities      consistent with pneumonia.   CONSULTATIONS:  There were none.   BRIEF ADMITTING HISTORY AND PHYSICAL:  This is a 65 year old white male with  HIV  diagnosed in 1999 who had been on HAART until 18 months ago.  He was  lost to followup due to insurance problems and presented with 6 weeks of  increasing dyspnea on exertion associated with a nonproductive cough.  He  denied chest pain, orthopnea, PND, fever, chills, sick contacts or  tuberculosis exposure.   On admission his vital signs were temperature of 98.7, blood pressure  111/73, respiratory rate 28, oxygen saturation 96% on room air.   Physical examination remarkable for crackles through the lower 2/3 of both  lung bases, with moderate air movement.  There was no wheezing.   Admission labs showed sodium 135, potassium 3.9, chloride 102, bicarbonate  28, BUN 15, creatinine 0.8, glucose 87, calcium 8.0, bilirubin 0.4, alkaline  phosphatase 59, AST 25, ALT 17, protein 7.0, albumin 1.9, INR 1.0.  Urinalysis shows specific gravity 1.026, protein 30.  CBC showed white blood  count 4900, hemoglobin 9.4,  platelets 351,000 with an ANC of 3.6 and MCV of  85.3.  Blood gas showed pH 7.50, PACO2 of 33, PAO2 of 93.  Chest x-ray  revealed patchy opacity throughout both lungs.   HOSPITAL COURSE:  1. Pneumonia.  Given the history of HIV, it was considered most likely      that the bilateral pulmonary infiltrates represented PCP pneumonia or      possibly MAC, although community acquired pneumonia as well as fungal      infection or tuberculosis were also considered.  It was decided to      treat empirically for PCP pneumonia with Bactrim.  The patient was not      a candidate for steroids given that his PO2 was greater than 60 and his      AA gradient was less than 35, and he was clinically stable.  Treatment      for community acquired pneumonia was begun with ceftriaxone and      azithromycin by IV.  Sputum cultures were sent for gram stain, culture,      as well as PCP and AFB smears.  Blood cultures drawn on admission were      negative.  The patient had difficulty producing sputum and  respiratory      therapy was involved to induce sputum.  The patient did produce a small      amount of sputum, and one sample was determined to be grade -2 (not of      lower respiratory origin).  The sample was negative for AFB smear as      well as negative for PCP.  During the hospitalization, the patient      showed good response to antibiotics and his symptoms of dyspnea on      exertion improved.  A PPD was placed and found to be nonreactive.      Repeat chest x-ray showed mild improvement in the pulmonary      infiltrates.  LDH was measured and found to be 188, which raised the      question of whether this was, in fact, PCP pneumonia.  Urine      pneumococcal antigen was found to be negative.  However, by the date of      discharge, the patient's symptoms had improved substantially and he      felt better than he had at home, and was anxious to return home in      order to attend to some of his personal affairs.  IV medications were      discontinued and converted to p.o. azithromycin, Avalox and Bactrim.      The patient was found on the day of discharge to desaturate to 86%      during ambulation, although he was not symptomatic with this.  He was      therefore written a prescription for home oxygen.   1. HIV/AIDS.  Reviewing clinic records showing that upon diagnosis in      December of 1999 the patient's CD4 count was 130.  In March 2000 it      reached a nadir of 50.  After initiation of HAART it returned to 230 in      December of 2000.  The most recent CD4 count prior to hospitalization      was 520 (27%) on 08/18/2003.  The patient stopped taking his      antiretroviral medications for the last 18 months due to temporary loss  of insurance.  During this admission, repeat CD4 was 30 (4%).  The      patient now has health insurance and is interested in re-initiating his      HAART.  1. Anemia.  During this admission the patient was noted to have anemia,      and in  fact anemia was the original reason for his HIV testing in 1999.      Iron was found to be 19 and ferritin was 21.  The reticulocyte count      was 0.9%.  Presumably any component of the anemia that was due to bone      marrow suppression from HIV would be best addressed by re-initiating      his HAART.   1. Hypoalbuminemia.  The patient was noted to have an albumin of 1.9 on      admission as well as urine protein of 30 mg/dL.  His prealbumin was      found to be 11.4 and his albumin on repeat was found to be 1.6.  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  HBV serologies were negative.  For his      malnutrition the patient was encouraged to eat Ensure supplements with      every meal.  The patient showed no edema and further workup of his      proteinuria was deferred at this time.   1. Hyponatremia.  This patient was admitted with sodium of 135 which      reached a nadir of 132 and was 136 by discharge.  The most likely      etiology given measured urine sodium of 82 is SIADH due to the      patient's pneumonia.  It is likely that this problem will improve as      the pneumonia is treated.   1. History of esophageal candidiasis.  The patient had been treated as an      outpatient for esophageal candidiasis with Fluconazole prior to      admission.  He had taken a complete course and had no clinical evidence      of ongoing infection.  Therefore, Fluconazole was discontinued prior to      discharge.   1. History of hypertension.  The patient was normotensive during this      admission and was therefore not treated for hypertension.   DISCHARGE LABORATORY:  Sodium 136, potassium 4.4, chloride 105, bicarbonate  26, BUN 7, creatinine 0.9, glucose 95, calcium 8.1.  CBC:  White count 5000,  hemoglobin 9.4, platelets 307,000.      Carolin Guernsey, MD   Electronically Signed     DA/MEDQ  D:  11/14/2005  T:  11/15/2005  Job:  [id number]   cc:   Jeffrey [name], M.D.

## 2010-12-14 ENCOUNTER — Other Ambulatory Visit (INDEPENDENT_AMBULATORY_CARE_PROVIDER_SITE_OTHER): Payer: Self-pay

## 2010-12-14 ENCOUNTER — Other Ambulatory Visit: Payer: Self-pay | Admitting: Internal Medicine

## 2010-12-14 ENCOUNTER — Other Ambulatory Visit: Payer: Self-pay

## 2010-12-14 DIAGNOSIS — B20 Human immunodeficiency virus [HIV] disease: Secondary | ICD-10-CM

## 2010-12-14 DIAGNOSIS — Z113 Encounter for screening for infections with a predominantly sexual mode of transmission: Secondary | ICD-10-CM

## 2010-12-14 DIAGNOSIS — Z79899 Other long term (current) drug therapy: Secondary | ICD-10-CM

## 2010-12-14 LAB — URINALYSIS, ROUTINE W REFLEX MICROSCOPIC
Glucose, UA: 500 mg/dL — AB
Hgb urine dipstick: NEGATIVE
Leukocytes, UA: NEGATIVE
Nitrite: NEGATIVE
Specific Gravity, Urine: 1.026 (ref 1.005–1.030)
pH: 6 (ref 5.0–8.0)

## 2010-12-14 LAB — URINALYSIS, MICROSCOPIC ONLY
Bacteria, UA: NONE SEEN
Squamous Epithelial / LPF: NONE SEEN

## 2010-12-14 LAB — COMPLETE METABOLIC PANEL WITH GFR
ALT: 11 U/L (ref 0–53)
AST: 15 U/L (ref 0–37)
CO2: 26 mEq/L (ref 19–32)
GFR, Est African American: >60 mL/min (ref >60)
Sodium: 139 mEq/L (ref 135–145)
Total Bilirubin: 0.5 mg/dL (ref 0.3–1.2)
Total Protein: 6.1 g/dL (ref 6.0–8.3)

## 2010-12-14 LAB — CBC WITH DIFFERENTIAL/PLATELET
Basophils Absolute: 0 10*3/uL (ref 0.0–0.1)
Eosinophils Absolute: 0 10*3/uL (ref 0.0–0.7)
Lymphocytes Relative: 40 % (ref 12–46)
Lymphs Abs: 1.9 10*3/uL (ref 0.7–4.0)
MCH: 33.7 pg (ref 26.0–34.0)
Neutrophils Relative %: 51 % (ref 43–77)
Platelets: 186 10*3/uL (ref 150–400)
RBC: 4.19 MIL/uL — ABNORMAL LOW (ref 4.22–5.81)
RDW: 13 % (ref 11.5–15.5)
WBC: 4.7 10*3/uL (ref 4.0–10.5)

## 2010-12-14 LAB — LIPID PANEL: Cholesterol: 194 mg/dL (ref 0–200)

## 2010-12-14 LAB — RPR

## 2010-12-15 LAB — GC/CHLAMYDIA PROBE AMP, URINE
Chlamydia, Swab/Urine, PCR: NEGATIVE
GC Probe Amp, Urine: NEGATIVE

## 2010-12-15 LAB — T-HELPER CELL (CD4) - (RCID CLINIC ONLY)
CD4 % Helper T Cell: 21 % — ABNORMAL LOW (ref 33–55)
CD4 T Cell Abs: 380 uL — ABNORMAL LOW (ref 400–2700)

## 2010-12-16 ENCOUNTER — Other Ambulatory Visit: Payer: Self-pay | Admitting: *Deleted

## 2010-12-16 DIAGNOSIS — B20 Human immunodeficiency virus [HIV] disease: Secondary | ICD-10-CM

## 2010-12-16 MED ORDER — OMEPRAZOLE 20 MG PO CPDR: 20.0000 mg | DELAYED_RELEASE_CAPSULE | Freq: Every day | ORAL | Status: DC

## 2010-12-28 ENCOUNTER — Encounter: Payer: Self-pay | Admitting: Internal Medicine

## 2010-12-28 ENCOUNTER — Ambulatory Visit: Payer: Self-pay

## 2010-12-28 ENCOUNTER — Ambulatory Visit (INDEPENDENT_AMBULATORY_CARE_PROVIDER_SITE_OTHER): Payer: Self-pay | Admitting: Internal Medicine

## 2010-12-28 VITALS — BP 144/93 | HR 73 | Temp 98.1°F | Wt 151.0 lb

## 2010-12-28 DIAGNOSIS — M7989 Other specified soft tissue disorders: Secondary | ICD-10-CM

## 2010-12-28 DIAGNOSIS — K219 Gastro-esophageal reflux disease without esophagitis: Secondary | ICD-10-CM

## 2010-12-28 DIAGNOSIS — Z23 Encounter for immunization: Secondary | ICD-10-CM

## 2010-12-28 DIAGNOSIS — I10 Essential (primary) hypertension: Secondary | ICD-10-CM

## 2010-12-28 DIAGNOSIS — B20 Human immunodeficiency virus [HIV] disease: Secondary | ICD-10-CM

## 2010-12-28 NOTE — Assessment & Plan Note (Signed)
He continues to have borderline hypertension. I reviewed lifestyle modification guidelines with him.

## 2010-12-28 NOTE — Progress Notes (Signed)
  Subjective:    Patient ID: Jeffrey [name], male    DOB: 27-May-1945, 65 y.o.   MRN: [id number]  HPI Jadrien is in for his routine visit. He does not recall missing a single dose of his Truvada and [name] since his last visit. He did stop taking his Prilosec for several weeks to see if it might be the cause of his hand swelling. The swelling seems to have gotten a little bit better over the last few months but he did not notice any temporal relationship to being off of the Prilosec so he restarted it. However he did not have any recurrent acid reflux symptoms and he is asking if it would be okay to stop the Prilosec now.    Review of Systems     Objective:   Physical Exam  Constitutional: He appears well-developed and well-nourished. No distress.  HENT:  Mouth/Throat: Oropharynx is clear and moist.  Eyes: Conjunctivae are normal.  Cardiovascular: Normal rate, regular rhythm and normal heart sounds.   No murmur heard. Pulmonary/Chest: Breath sounds normal. He has no wheezes. He has no rales.  Abdominal: Soft. Bowel sounds are normal.  Musculoskeletal:       He has very slight swelling of his wrist most noticeable adjacent to his watch band  Skin: No rash noted.  Psychiatric: He has a normal mood and affect.          Assessment & Plan:

## 2010-12-28 NOTE — Assessment & Plan Note (Signed)
I do not know the cause of his very mild hand swelling that he notices after he has been laying down. We will simply follow it for the time being.

## 2010-12-28 NOTE — Assessment & Plan Note (Signed)
He will stop his Prilosec now been noticed restarted if he starts to have any further acid reflux symptoms or difficulty swallowing.

## 2010-12-28 NOTE — Assessment & Plan Note (Signed)
His HIV infection remains under good control with her viral load is undetectable at less than 20. His CD4 has reconstituted to 380.

## 2011-01-31 LAB — T-HELPER CELL (CD4) - (RCID CLINIC ONLY)
CD4 % Helper T Cell: 11 — ABNORMAL LOW
CD4 T Cell Abs: 170 — ABNORMAL LOW

## 2011-02-08 LAB — T-HELPER CELL (CD4) - (RCID CLINIC ONLY): CD4 % Helper T Cell: 16 — ABNORMAL LOW

## 2011-02-15 LAB — T-HELPER CELL (CD4) - (RCID CLINIC ONLY): CD4 T Cell Abs: 350 — ABNORMAL LOW

## 2011-03-27 ENCOUNTER — Other Ambulatory Visit: Payer: Self-pay | Admitting: *Deleted

## 2011-03-27 DIAGNOSIS — B20 Human immunodeficiency virus [HIV] disease: Secondary | ICD-10-CM

## 2011-03-27 MED ORDER — LOPINAVIR-RITONAVIR 200-50 MG PO TABS: 2.0000 | ORAL_TABLET | Freq: Two times a day (BID) | ORAL | Status: DC

## 2011-03-27 MED ORDER — EMTRICITABINE-TENOFOVIR DF 200-300 MG PO TABS: 1.0000 | ORAL_TABLET | Freq: Every day | ORAL | Status: DC

## 2011-07-06 ENCOUNTER — Ambulatory Visit: Payer: Self-pay

## 2011-10-20 ENCOUNTER — Other Ambulatory Visit: Payer: Self-pay | Admitting: Licensed Clinical Social Worker

## 2011-10-20 DIAGNOSIS — B20 Human immunodeficiency virus [HIV] disease: Secondary | ICD-10-CM

## 2011-10-20 MED ORDER — EMTRICITABINE-TENOFOVIR DF 200-300 MG PO TABS: 1.0000 | ORAL_TABLET | Freq: Every day | ORAL | Status: DC

## 2011-10-20 MED ORDER — LOPINAVIR-RITONAVIR 200-50 MG PO TABS: 2.0000 | ORAL_TABLET | Freq: Two times a day (BID) | ORAL | Status: DC

## 2011-10-24 ENCOUNTER — Telehealth: Payer: Self-pay | Admitting: *Deleted

## 2011-10-24 NOTE — Telephone Encounter (Signed)
I left a message asking him to call & make an appt

## 2011-11-02 ENCOUNTER — Other Ambulatory Visit (INDEPENDENT_AMBULATORY_CARE_PROVIDER_SITE_OTHER): Payer: Self-pay

## 2011-11-02 DIAGNOSIS — Z79899 Other long term (current) drug therapy: Secondary | ICD-10-CM

## 2011-11-02 DIAGNOSIS — B20 Human immunodeficiency virus [HIV] disease: Secondary | ICD-10-CM

## 2011-11-02 DIAGNOSIS — Z113 Encounter for screening for infections with a predominantly sexual mode of transmission: Secondary | ICD-10-CM

## 2011-11-02 LAB — CBC WITH DIFFERENTIAL/PLATELET
Basophils Relative: 0 % (ref 0–1)
Eosinophils Absolute: 0 10*3/uL (ref 0.0–0.7)
Eosinophils Relative: 1 % (ref 0–5)
Hemoglobin: 13.9 g/dL (ref 13.0–17.0)
Lymphs Abs: 1.8 10*3/uL (ref 0.7–4.0)
MCH: 34.1 pg — ABNORMAL HIGH (ref 26.0–34.0)
MCHC: 35.5 g/dL (ref 30.0–36.0)
MCV: 96.1 fL (ref 78.0–100.0)
Monocytes Relative: 10 % (ref 3–12)
Neutrophils Relative %: 51 % (ref 43–77)
Platelets: 182 10*3/uL (ref 150–400)
RBC: 4.08 MIL/uL — ABNORMAL LOW (ref 4.22–5.81)

## 2011-11-02 LAB — COMPLETE METABOLIC PANEL WITH GFR
Alkaline Phosphatase: 101 U/L (ref 39–117)
CO2: 27 mEq/L (ref 19–32)
Creat: 1.35 mg/dL (ref 0.50–1.35)
GFR, Est African American: 64 mL/min
GFR, Est Non African American: 55 mL/min — ABNORMAL LOW
Glucose, Bld: 99 mg/dL (ref 70–99)
Total Bilirubin: 0.3 mg/dL (ref 0.3–1.2)

## 2011-11-02 LAB — LIPID PANEL
Cholesterol: 171 mg/dL (ref 0–200)
HDL: 41 mg/dL (ref >39)
Total CHOL/HDL Ratio: 4.2 Ratio

## 2011-11-02 LAB — RPR

## 2011-11-03 LAB — HIV-1 RNA QUANT-NO REFLEX-BLD
HIV 1 RNA Quant: <20 copies/mL (ref <20)
HIV-1 RNA Quant, Log: <1.3 {Log} (ref <1.30)

## 2011-11-03 LAB — T-HELPER CELL (CD4) - (RCID CLINIC ONLY): CD4 T Cell Abs: 370 uL — ABNORMAL LOW (ref 400–2700)

## 2011-11-14 ENCOUNTER — Ambulatory Visit (INDEPENDENT_AMBULATORY_CARE_PROVIDER_SITE_OTHER): Payer: Self-pay | Admitting: Internal Medicine

## 2011-11-14 ENCOUNTER — Encounter: Payer: Self-pay | Admitting: Internal Medicine

## 2011-11-14 VITALS — BP 157/89 | HR 60 | Temp 98.1°F | Ht 70.0 in | Wt 155.5 lb

## 2011-11-14 DIAGNOSIS — B20 Human immunodeficiency virus [HIV] disease: Secondary | ICD-10-CM

## 2011-11-14 DIAGNOSIS — I10 Essential (primary) hypertension: Secondary | ICD-10-CM

## 2011-11-14 MED ORDER — HYDROCHLOROTHIAZIDE 25 MG PO TABS: 25.0000 mg | ORAL_TABLET | Freq: Every day | ORAL | Status: DC

## 2011-11-14 NOTE — Progress Notes (Signed)
Patient ID: Jeffrey [name], male   DOB: 1946-04-19, 66 y.o.   MRN: [id number]     [facility] for Infectious Disease  Patient Active Problem List  Diagnosis  . HIV DISEASE  . HYPERTENSION  . ESOPHAGITIS  . ABSCESS, NECK  . CUTANEOUS ERUPTIONS, DRUG-INDUCED  . NUMBNESS  . CERVICAL LYMPHADENOPATHY  . DYSPHAGIA UNSPECIFIED  . BACK STRAIN, ACUTE  . PNEUMONIA, HX OF  . CHOLECYSTECTOMY, HX OF  . ARTHROSCOPY, KNEE, HX OF  . GERD  . SWELLING OF LIMB  . CHEST PAIN    Patient's Medications  New Prescriptions   HYDROCHLOROTHIAZIDE (HYDRODIURIL) 25 MG TABLET    Take 1 tablet (25 mg total) by mouth daily.  Previous Medications   EMTRICITABINE-TENOFOVIR (TRUVADA) 200-300 MG PER TABLET    Take 1 tablet by mouth daily.   LOPINAVIR-RITONAVIR ([name]) 200-50 MG PER TABLET    Take 2 tablets by mouth 2 (two) times daily.  Modified Medications   No medications on file  Discontinued Medications   No medications on file    Subjective: Jeffrey [name] is in for his routine visit. His only missed one dose of his HIV medicines since his last visit when his pharmacy with late filling his refills. He is hoping to get some dental care but otherwise is doing well. He used to take hydrochlorothiazide for blood pressure elevation many many years ago.  Objective: Temp: 98.1 F (36.7 C) (07/09 0902) Temp src: Oral (07/09 0902) BP: 157/89 mmHg (07/09 0902) Pulse Rate: 60  (07/09 0902)  General: He is in good spirits. His weight is down intentionally. Oral: Poor dentition with some missing teeth Skin: No rash Lungs: Clear Cor: Regular S1 and S2 no murmurs Abdomen: Nontender  Lab Results HIV 1 RNA Quant (copies/mL)  Date Value  11/02/2011 <20   12/14/2010 <20   04/08/2010 29*     CD4 T Cell Abs (cmm)  Date Value  11/02/2011 370*  12/14/2010 380*  04/08/2010 300*     Assessment: His HIV infection remains under very good control. I will continue his current regimen.  I will restart HCTZ.  I will  refer him to our dental clinic.  Plan: 1. Continue Truvada and [name] 2. Restart HCTZ 3. Dental clinic referral 4. Followup after lab work in [place] months   [name], MD [facility] for [place] [phone] pager   [phone] cell 11/14/2011, 9:21 AM

## 2011-12-12 ENCOUNTER — Ambulatory Visit: Payer: Self-pay

## 2011-12-13 ENCOUNTER — Ambulatory Visit: Payer: Self-pay

## 2012-05-23 ENCOUNTER — Other Ambulatory Visit: Payer: Self-pay | Admitting: *Deleted

## 2012-05-23 DIAGNOSIS — B20 Human immunodeficiency virus [HIV] disease: Secondary | ICD-10-CM

## 2012-05-23 MED ORDER — LOPINAVIR-RITONAVIR 200-50 MG PO TABS: 2.0000 | ORAL_TABLET | Freq: Two times a day (BID) | ORAL | Status: DC

## 2012-05-23 MED ORDER — EMTRICITABINE-TENOFOVIR DF 200-300 MG PO TABS: 1.0000 | ORAL_TABLET | Freq: Every day | ORAL | Status: DC

## 2012-06-25 ENCOUNTER — Ambulatory Visit: Payer: Self-pay

## 2012-07-08 ENCOUNTER — Other Ambulatory Visit (INDEPENDENT_AMBULATORY_CARE_PROVIDER_SITE_OTHER): Payer: BC Managed Care – PPO

## 2012-07-08 LAB — COMPREHENSIVE METABOLIC PANEL
ALT: 13 U/L (ref 0–53)
AST: 16 U/L (ref 0–37)
Calcium: 9.3 mg/dL (ref 8.4–10.5)
Chloride: 100 mEq/L (ref 96–112)
Creat: 1.67 mg/dL — ABNORMAL HIGH (ref 0.50–1.35)
Sodium: 138 mEq/L (ref 135–145)
Total Protein: 6.5 g/dL (ref 6.0–8.3)

## 2012-07-08 LAB — LIPID PANEL
Total CHOL/HDL Ratio: 4.3 Ratio
VLDL: 58 mg/dL — ABNORMAL HIGH (ref 0–40)

## 2012-07-08 LAB — CBC
MCV: 97.4 fL (ref 78.0–100.0)
Platelets: 222 10*3/uL (ref 150–400)
RDW: 14.1 % (ref 11.5–15.5)
WBC: 5.2 10*3/uL (ref 4.0–10.5)

## 2012-07-09 LAB — T-HELPER CELL (CD4) - (RCID CLINIC ONLY)
CD4 % Helper T Cell: 20 % — ABNORMAL LOW (ref 33–55)
CD4 T Cell Abs: 440 uL (ref 400–2700)

## 2012-07-09 LAB — HIV-1 RNA QUANT-NO REFLEX-BLD
HIV 1 RNA Quant: <20 copies/mL (ref <20)
HIV-1 RNA Quant, Log: <1.3 {Log} (ref <1.30)

## 2012-07-22 ENCOUNTER — Encounter: Payer: Self-pay | Admitting: Internal Medicine

## 2012-07-22 ENCOUNTER — Ambulatory Visit (INDEPENDENT_AMBULATORY_CARE_PROVIDER_SITE_OTHER): Payer: BC Managed Care – PPO | Admitting: Internal Medicine

## 2012-07-22 VITALS — BP 160/94 | HR 61 | Temp 97.9°F | Ht 70.0 in | Wt 162.0 lb

## 2012-07-22 DIAGNOSIS — B20 Human immunodeficiency virus [HIV] disease: Secondary | ICD-10-CM

## 2012-07-22 DIAGNOSIS — N529 Male erectile dysfunction, unspecified: Secondary | ICD-10-CM | POA: Insufficient documentation

## 2012-07-22 DIAGNOSIS — Z9889 Other specified postprocedural states: Secondary | ICD-10-CM

## 2012-07-22 DIAGNOSIS — N289 Disorder of kidney and ureter, unspecified: Secondary | ICD-10-CM

## 2012-07-22 DIAGNOSIS — E785 Hyperlipidemia, unspecified: Secondary | ICD-10-CM | POA: Insufficient documentation

## 2012-07-22 DIAGNOSIS — E876 Hypokalemia: Secondary | ICD-10-CM | POA: Insufficient documentation

## 2012-07-22 LAB — BASIC METABOLIC PANEL
Calcium: 8.9 mg/dL (ref 8.4–10.5)
Chloride: 101 mEq/L (ref 96–112)
Creat: 1.31 mg/dL (ref 0.50–1.35)

## 2012-07-22 MED ORDER — ACETAMINOPHEN 500 MG PO TABS: 500.0000 mg | ORAL_TABLET | Freq: Four times a day (QID) | ORAL | Status: DC | PRN

## 2012-07-22 NOTE — Progress Notes (Addendum)
Patient ID: Jeffrey [name], male   DOB: [birth year], 67 y.o.   MRN: [id number]           [facility] for Infectious Disease  Patient Active Problem List  Diagnosis  . HIV DISEASE  . HYPERTENSION  . ESOPHAGITIS  . ABSCESS, NECK  . CUTANEOUS ERUPTIONS, DRUG-INDUCED  . CERVICAL LYMPHADENOPATHY  . DYSPHAGIA UNSPECIFIED  . PNEUMONIA, HX OF  . CHOLECYSTECTOMY, HX OF  . ARTHROSCOPY, KNEE, HX OF  . GERD  . CHEST PAIN  . Dyslipidemia  . Acute renal insufficiency  . Hypokalemia  . Erectile dysfunction    Patient's Medications  New Prescriptions   ACETAMINOPHEN (TYLENOL) 500 MG TABLET    Take 1 tablet (500 mg total) by mouth every 6 (six) hours as needed for pain.  Previous Medications   EMTRICITABINE-TENOFOVIR (TRUVADA) 200-300 MG PER TABLET    Take 1 tablet by mouth daily.   HYDROCHLOROTHIAZIDE (HYDRODIURIL) 25 MG TABLET    Take 1 tablet (25 mg total) by mouth daily.   LOPINAVIR-RITONAVIR ([name]) 200-50 MG PER TABLET    Take 2 tablets by mouth 2 (two) times daily.  Modified Medications   No medications on file  Discontinued Medications   NAPROXEN SODIUM (ANAPROX) 220 MG TABLET    Take 220 mg by mouth 2 (two) times daily with a meal.    Subjective: Jeffrey [name] is in for his routine visit. As usual, he never misses a single dose of his Truvada, [name] or hydrochlorothiazide. His also been taking Aleve twice daily recently for his increased left knee pain. His most noticeable medially. He had arthroscopic knee surgery in 1989. He had multiple dental extractions recently and we'll wait 3 months before he gets his dentures. He has not been sexually active in years and has had difficulty having erections.  Objective: Temp: 97.9 F (36.6 C) (03/17 1344) Temp src: Oral (03/17 1344) BP: 160/94 mmHg (03/17 1344) Pulse Rate: 61 (03/17 1344)  General: he is in good spirits Skin: no rash Lungs: clear Cor: regular S1-S2 no murmurs Abdomen: nontender Left knee: No unusual crepitus,  swelling, redness, warmth or pain with range of motion  Lab Results HIV 1 RNA Quant (copies/mL)  Date Value  07/08/2012 <20   11/02/2011 <20   12/14/2010 <20      CD4 T Cell Abs (cmm)  Date Value  07/08/2012 440   11/02/2011 370*  12/14/2010 380*   BMET    Component Value Date/Time   NA 138 07/08/2012 1105   K 3.2* 07/08/2012 1105   CL 100 07/08/2012 1105   CO2 31 07/08/2012 1105   GLUCOSE 76 07/08/2012 1105   BUN 22 07/08/2012 1105   CREATININE 1.67* 07/08/2012 1105   CREATININE 1.16 05/17/2010 2017   CALCIUM 9.3 07/08/2012 1105   GFRNONAA >60 04/12/2010 1304   GFRAA  Value: >60        The eGFR has been calculated using the MDRD equation. This calculation has not been validated in all clinical situations. eGFR's persistently <60 mL/min signify possible Chronic Kidney Disease. 04/12/2010 1304      Lab Results  Component Value Date   CHOL 185 07/08/2012   HDL 43 07/08/2012   LDLCALC 84 07/08/2012   TRIG 290* 07/08/2012   CHOLHDL 4.3 07/08/2012   Assessment: His HIV infection remains under excellent control. I will continue his current regimen but consider a switch to a simpler regimen that may have less impact on his triglycerides.  He has acute renal insufficiency. This  could be related to his Aleve. I will have him stop Aleve and take Tylenol for now pending repeat lab work to check his creatinine and potassium.  He has chronic erectile dysfunction. I will check a testosterone level.  Plan: 1. Check BMP and testosterone level 1. Discontinue Aleve 2. I will call him with the results tomorrow   Jeffrey Bickers, MD [place] for [place] [phone] pager   [phone] cell 07/22/2012, 2:08 PM  Addendum: BMET    Component Value Date/Time   NA 137 07/22/2012 1418   K 3.3* 07/22/2012 1418   CL 101 07/22/2012 1418   CO2 28 07/22/2012 1418   GLUCOSE 91 07/22/2012 1418   BUN 25* 07/22/2012 1418   CREATININE 1.31 07/22/2012 1418   CREATININE 1.16 05/17/2010 2017    CALCIUM 8.9 07/22/2012 1418   GFRNONAA >60 04/12/2010 1304   GFRAA  Value: >60        The eGFR has been calculated using the MDRD equation. This calculation has not been validated in all clinical situations. eGFR's persistently <60 mL/min signify possible Chronic Kidney Disease. 04/12/2010 1304   Jeffrey [name]' renal insufficiency for self corrected. He has hypokalemia related to his hydrochlorothiazide and his blood pressure is not well controlled. I will change hydrochlorothiazide to lisinopril. I will also simplify his antiretroviral regimen to [name]. This may also help his hypertriglyceridemia. I will arrange followup in 3 months after lab work.  Jeffrey Bickers, MD [facility] for [place] [facility] [phone] pager   [phone] cell 07/24/2012, 1:02 PM

## 2012-07-23 ENCOUNTER — Ambulatory Visit
Admission: RE | Admit: 2012-07-23 | Discharge: 2012-07-23 | Disposition: A | Discharge status: Home / Self Care | Payer: BC Managed Care – PPO | Source: Ambulatory Visit | Attending: Internal Medicine | Admitting: Internal Medicine

## 2012-07-23 LAB — TESTOSTERONE, FREE, TOTAL, SHBG
Sex Hormone Binding: 96 nmol/L — ABNORMAL HIGH (ref 13–71)
Testosterone, Free: 89.5 pg/mL (ref 47.0–244.0)

## 2012-07-24 MED ORDER — ELVITEG-COBIC-EMTRICIT-TENOFDF 150-150-200-300 MG PO TABS: 1.0000 | ORAL_TABLET | Freq: Every day | ORAL | Status: DC

## 2012-07-24 MED ORDER — LISINOPRIL 10 MG PO TABS: 10.0000 mg | ORAL_TABLET | Freq: Every day | ORAL | Status: DC

## 2012-07-24 NOTE — Addendum Note (Signed)
Addended by: [name] on: 07/24/2012 01:04 PM   Modules accepted: Orders, Medications

## 2012-10-17 ENCOUNTER — Other Ambulatory Visit (INDEPENDENT_AMBULATORY_CARE_PROVIDER_SITE_OTHER): Payer: BC Managed Care – PPO

## 2012-10-17 DIAGNOSIS — B20 Human immunodeficiency virus [HIV] disease: Secondary | ICD-10-CM

## 2012-10-17 LAB — COMPLETE METABOLIC PANEL WITH GFR
AST: 21 U/L (ref 0–37)
Alkaline Phosphatase: 81 U/L (ref 39–117)
BUN: 19 mg/dL (ref 6–23)
Creat: 1.49 mg/dL — ABNORMAL HIGH (ref 0.50–1.35)
GFR, Est Non African American: 49 mL/min — ABNORMAL LOW
Glucose, Bld: 81 mg/dL (ref 70–99)
Total Bilirubin: 0.5 mg/dL (ref 0.3–1.2)

## 2012-10-17 LAB — LIPID PANEL
Cholesterol: 166 mg/dL (ref 0–200)
HDL: 44 mg/dL (ref >39)
LDL Cholesterol: 103 mg/dL — ABNORMAL HIGH (ref 0–99)
Total CHOL/HDL Ratio: 3.8 Ratio
Triglycerides: 97 mg/dL (ref <150)

## 2012-10-18 LAB — HIV-1 RNA QUANT-NO REFLEX-BLD: HIV 1 RNA Quant: 13666 copies/mL — ABNORMAL HIGH (ref <20)

## 2012-10-18 LAB — T-HELPER CELL (CD4) - (RCID CLINIC ONLY): CD4 T Cell Abs: 290 uL — ABNORMAL LOW (ref 400–2700)

## 2012-10-30 ENCOUNTER — Encounter: Payer: Self-pay | Admitting: Internal Medicine

## 2012-10-30 ENCOUNTER — Ambulatory Visit (INDEPENDENT_AMBULATORY_CARE_PROVIDER_SITE_OTHER): Payer: BC Managed Care – PPO | Admitting: Internal Medicine

## 2012-10-30 VITALS — BP 154/97 | HR 75 | Temp 98.1°F | Ht 70.0 in | Wt 157.0 lb

## 2012-10-30 DIAGNOSIS — Z21 Asymptomatic human immunodeficiency virus [HIV] infection status: Secondary | ICD-10-CM

## 2012-10-30 DIAGNOSIS — B20 Human immunodeficiency virus [HIV] disease: Secondary | ICD-10-CM

## 2012-10-30 MED ORDER — OMEPRAZOLE 20 MG PO CPDR: 20.0000 mg | DELAYED_RELEASE_CAPSULE | Freq: Every day | ORAL | Status: DC

## 2012-10-30 NOTE — Progress Notes (Signed)
Patient ID: Jeffrey [name], male   DOB: 30-Sep-1945, 67 y.o.   MRN: [id number]          [facility] for Infectious Disease  Patient Active Problem List   Diagnosis Date Noted  . HIV DISEASE 02/27/2006    Priority: High  . Dyslipidemia 07/22/2012    Priority: Medium  . Acute renal insufficiency 07/22/2012    Priority: Medium  . Hypokalemia 07/22/2012    Priority: Medium  . Erectile dysfunction 07/22/2012    Priority: Medium  . HYPERTENSION 02/27/2006    Priority: Medium  . GERD 05/17/2010  . CHEST PAIN 05/17/2010  . DYSPHAGIA UNSPECIFIED 03/08/2009  . CERVICAL LYMPHADENOPATHY 09/19/2007  . CUTANEOUS ERUPTIONS, DRUG-INDUCED 05/28/2006  . CHOLECYSTECTOMY, HX OF 05/28/2006  . ESOPHAGITIS 02/27/2006  . ABSCESS, NECK 02/27/2006  . PNEUMONIA, HX OF 02/27/2006  . ARTHROSCOPY, KNEE, HX OF 02/27/2006    Patient's Medications  New Prescriptions   No medications on file  Previous Medications   ACETAMINOPHEN (TYLENOL) 500 MG TABLET    Take 1 tablet (500 mg total) by mouth every 6 (six) hours as needed for pain.   ELVITEGRAVIR-COBICISTAT-EMTRICITABINE-TENOFOVIR ([name]) 150-150-200-300 MG TABS    Take 1 tablet by mouth daily with breakfast.   LISINOPRIL (PRINIVIL) 10 MG TABLET    Take 1 tablet (10 mg total) by mouth daily.  Modified Medications   Modified Medication Previous Medication   OMEPRAZOLE (PRILOSEC) 20 MG CAPSULE omeprazole (PRILOSEC) 20 MG capsule      Take 1 capsule (20 mg total) by mouth daily.    Take 1 capsule (20 mg total) by mouth daily.  Discontinued Medications   No medications on file    Subjective: Jeffrey [name] is in for his routine visit. He states that he didn't start [name] after his visit with me 3 months ago. His initial months prescription was covered by [employer] but then he was told he would need to pay $750 for refills. I try to clarify this with him but I am still uncertain how long he was on [name]. He tells me that he got one refill so that he would've  taken it for 2 months and then later he tells me he got 3 refills. He thinks he was on [name] when his blood work was repeated on June 12. He has not been able to start his lisinopril yet and is no longer taking hydrochlorothiazide. He is currently off all medications. He is now switching to [facility] and will only have a $3 co-pay for medications starting July 1. He is feeling well except he is noticing a return of his dysphagia to solid food. He is no longer taking Prilosec. He is not having any acid reflux symptoms. He thinks some of the problem with swallowing may be related to the fact that he had teeth pulled and has been unable to obtain dentures. He has had no problems swallowing pills.  Review of Systems: Pertinent items are noted in HPI.  No past medical history on file.  History  Substance Use Topics  . Smoking status: Never Smoker   . Smokeless tobacco: Never Used  . Alcohol Use: No    No family history on file.  Allergies  Allergen Reactions  . Efavirenz   . Nevirapine     Objective: Temp: 98.1 F (36.7 C) (06/25 1423) Temp src: Oral (06/25 1423) BP: 154/97 mmHg (06/25 1423) Pulse Rate: 75 (06/25 1423)  General: He is in good spirits Oral: Edentulous with no oral pharyngeal lesions  Skin: No rash Lungs: Clear Cor: Regular S1-S2 no murmurs Abdomen: Soft and nontender with no masses Joints and extremities: Normal Neuro: Alert and oriented with normal speech Mood and affect normal  Lab Results HIV 1 RNA Quant (copies/mL)  Date Value  10/17/2012 13666*  07/08/2012 <20   11/02/2011 <20      CD4 T Cell Abs (cmm)  Date Value  10/17/2012 290*  07/08/2012 440   11/02/2011 370*     Lab Results  Component Value Date   WBC 5.2 07/08/2012   HGB 14.4 07/08/2012   HCT 40.5 07/08/2012   MCV 97.4 07/08/2012   PLT 222 07/08/2012   BMET    Component Value Date/Time   NA 141 10/17/2012 0924   K 3.9 10/17/2012 0924   CL 108 10/17/2012 0924   CO2 25 10/17/2012 0924    GLUCOSE 81 10/17/2012 0924   BUN 19 10/17/2012 0924   CREATININE 1.49* 10/17/2012 0924   CREATININE 1.16 05/17/2010 2017   CALCIUM 8.9 10/17/2012 0924   GFRNONAA >60 04/12/2010 1304   GFRAA  Value: >60        The eGFR has been calculated using the MDRD equation. This calculation has not been validated in all clinical situations. eGFR's persistently <60 mL/min signify possible Chronic Kidney Disease. 04/12/2010 1304   Lab Results  Component Value Date   ALT 29 10/17/2012   AST 21 10/17/2012   ALKPHOS 81 10/17/2012   BILITOT 0.5 10/17/2012   Lab Results  Component Value Date   CHOL 166 10/17/2012   HDL 44 10/17/2012   LDLCALC 103* 10/17/2012   TRIG 97 10/17/2012   CHOLHDL 3.8 10/17/2012   Assessment: If indeed he was on [name] when his blood work was done recently that raises concern for resistance but it sounds like he may actually have only taken it for 2 months and therefore been off of it for several weeks before blood work was done. He has not had any documented resistance to the components of [name] previously.  His triglycerides have improved off of [name]  His blood pressure is up but he is not on antihypertensives but should be able to restart at July 1.  His creatinine remains slightly elevated. I will repeat that today.  He is having recurrent dysphagia with solids. This is probably do to return of a peptic stricture but may also be related to difficulty chewing food since his teeth were pulled. I will have him restart Prilosec. If he is no better he will need to return to see Dr. Erskine Emery who did esophageal dilatation several years ago.    Plan: 1. Check basic metabolic panel, HLA B. 6286, genotype and in a great his resistance assays 2. Restart Prilosec 3. Restart [name] on July 1 4. Start lisinopril on July 1 5. Followup after lab work in [place] weeks   [name], MD [facility] for [place] [phone] pager   [phone]  cell 10/30/2012, 2:53 PM

## 2012-10-31 LAB — CBC WITH DIFFERENTIAL/PLATELET
Basophils Relative: 0 % (ref 0–1)
HCT: 39.7 % (ref 39.0–52.0)
Hemoglobin: 13.4 g/dL (ref 13.0–17.0)
Lymphocytes Relative: 49 % — ABNORMAL HIGH (ref 12–46)
MCHC: 33.8 g/dL (ref 30.0–36.0)
Monocytes Absolute: 0.5 10*3/uL (ref 0.1–1.0)
Monocytes Relative: 10 % (ref 3–12)
Neutro Abs: 1.9 10*3/uL (ref 1.7–7.7)

## 2012-10-31 LAB — COMPREHENSIVE METABOLIC PANEL
ALT: 33 U/L (ref 0–53)
Alkaline Phosphatase: 85 U/L (ref 39–117)
Glucose, Bld: 95 mg/dL (ref 70–99)
Sodium: 142 mEq/L (ref 135–145)
Total Bilirubin: 0.3 mg/dL (ref 0.3–1.2)
Total Protein: 6.3 g/dL (ref 6.0–8.3)

## 2012-10-31 LAB — T-HELPER CELL (CD4) - (RCID CLINIC ONLY): CD4 % Helper T Cell: 11 % — ABNORMAL LOW (ref 33–55)

## 2012-11-01 LAB — HIV-1 GENOTYPR PLUS

## 2012-11-04 ENCOUNTER — Other Ambulatory Visit: Payer: Self-pay | Admitting: *Deleted

## 2012-11-04 DIAGNOSIS — B20 Human immunodeficiency virus [HIV] disease: Secondary | ICD-10-CM

## 2012-11-04 MED ORDER — ELVITEG-COBIC-EMTRICIT-TENOFDF 150-150-200-300 MG PO TABS: 1.0000 | ORAL_TABLET | Freq: Every day | ORAL | Status: DC

## 2012-11-04 MED ORDER — OMEPRAZOLE 20 MG PO CPDR: 20.0000 mg | DELAYED_RELEASE_CAPSULE | Freq: Every day | ORAL | Status: DC

## 2012-11-04 MED ORDER — LISINOPRIL 10 MG PO TABS: 10.0000 mg | ORAL_TABLET | Freq: Every day | ORAL | Status: DC

## 2012-11-05 LAB — HIV-1 INTEGRASE GENOTYPE

## 2012-12-04 ENCOUNTER — Other Ambulatory Visit: Payer: Self-pay | Admitting: Internal Medicine

## 2012-12-04 DIAGNOSIS — Z113 Encounter for screening for infections with a predominantly sexual mode of transmission: Secondary | ICD-10-CM

## 2012-12-12 ENCOUNTER — Other Ambulatory Visit: Payer: BC Managed Care – PPO

## 2012-12-12 ENCOUNTER — Ambulatory Visit: Payer: BC Managed Care – PPO | Admitting: Internal Medicine

## 2012-12-16 ENCOUNTER — Other Ambulatory Visit (INDEPENDENT_AMBULATORY_CARE_PROVIDER_SITE_OTHER): Payer: Medicare PPO

## 2012-12-16 DIAGNOSIS — B20 Human immunodeficiency virus [HIV] disease: Secondary | ICD-10-CM

## 2012-12-16 DIAGNOSIS — Z113 Encounter for screening for infections with a predominantly sexual mode of transmission: Secondary | ICD-10-CM

## 2012-12-16 LAB — RPR

## 2012-12-17 LAB — T-HELPER CELL (CD4) - (RCID CLINIC ONLY): CD4 % Helper T Cell: 18 % — ABNORMAL LOW (ref 33–55)

## 2012-12-18 LAB — HIV-1 RNA QUANT-NO REFLEX-BLD: HIV-1 RNA Quant, Log: <1.3 {Log} (ref <1.30)

## 2012-12-26 ENCOUNTER — Ambulatory Visit (INDEPENDENT_AMBULATORY_CARE_PROVIDER_SITE_OTHER): Payer: Medicare PPO | Admitting: Internal Medicine

## 2012-12-26 ENCOUNTER — Encounter: Payer: Self-pay | Admitting: Internal Medicine

## 2012-12-26 VITALS — BP 122/81 | HR 85 | Temp 98.3°F | Wt 154.0 lb

## 2012-12-26 DIAGNOSIS — B20 Human immunodeficiency virus [HIV] disease: Secondary | ICD-10-CM

## 2012-12-26 NOTE — Progress Notes (Signed)
Patient ID: Jeffrey [name], male   DOB: [birth year], 67 y.o.   MRN: [id number]          [facility] for Infectious Disease  Patient Active Problem List   Diagnosis Date Noted  . HIV DISEASE 02/27/2006    Priority: High  . Dyslipidemia 07/22/2012    Priority: Medium  . Acute renal insufficiency 07/22/2012    Priority: Medium  . Hypokalemia 07/22/2012    Priority: Medium  . Erectile dysfunction 07/22/2012    Priority: Medium  . HYPERTENSION 02/27/2006    Priority: Medium  . GERD 05/17/2010  . CHEST PAIN 05/17/2010  . DYSPHAGIA UNSPECIFIED 03/08/2009  . CERVICAL LYMPHADENOPATHY 09/19/2007  . CUTANEOUS ERUPTIONS, DRUG-INDUCED 05/28/2006  . CHOLECYSTECTOMY, HX OF 05/28/2006  . ESOPHAGITIS 02/27/2006  . ABSCESS, NECK 02/27/2006  . PNEUMONIA, HX OF 02/27/2006  . ARTHROSCOPY, KNEE, HX OF 02/27/2006    Patient's Medications  New Prescriptions   No medications on file  Previous Medications   ACETAMINOPHEN (TYLENOL) 500 MG TABLET    Take 1 tablet (500 mg total) by mouth every 6 (six) hours as needed for pain.   ELVITEGRAVIR-COBICISTAT-EMTRICITABINE-TENOFOVIR ([name]) 150-150-200-300 MG TABS    Take 1 tablet by mouth daily with breakfast.   OMEPRAZOLE (PRILOSEC) 20 MG CAPSULE    Take 1 capsule (20 mg total) by mouth daily.  Modified Medications   No medications on file  Discontinued Medications   LISINOPRIL (PRINIVIL) 10 MG TABLET    Take 1 tablet (10 mg total) by mouth daily.    Subjective: [name] is in for his routine visit. He was able to start [name] and has not missed a single dose since his visit in June. He was also able to restart lisinopril. He has been monitoring his blood pressure and is generally been running much lower and usually around 95/70 in the morning and slightly higher in the evening. He has felt a little more weak and rundown since restarting lisinopril. He is feeling much better since restarting omeprazole. He is not having any trouble swallowing and  he is not having any acid reflux symptoms. He is still working with the [place] but has not been able to obtain new dentures Review of Systems: Constitutional: positive for fatigue, negative for anorexia, chills, fevers, sweats and weight loss Eyes: negative Ears, nose, mouth, throat, and face: negative Respiratory: negative Cardiovascular: negative Gastrointestinal: negative Genitourinary:negative  No past medical history on file.  History  Substance Use Topics  . Smoking status: Never Smoker   . Smokeless tobacco: Never Used  . Alcohol Use: No    No family history on file.  Allergies  Allergen Reactions  . Efavirenz   . Nevirapine     Objective: Temp: 98.3 F (36.8 C) (08/21 1420) Temp src: Oral (08/21 1420) BP: 122/81 mmHg (08/21 1420) Pulse Rate: 85 (08/21 1420)  General: He is in good spirits Oral: Edentulous, no oropharyngeal lesions Skin: No rash Lungs: Clear Cor: Regular S1 and S2 no murmurs Abdomen: Soft and nontender  Joints and extremities: Normal  Neuro: Alert with normal speech and conversation Mood and affect: Normal  Lab Results HIV 1 RNA Quant (copies/mL)  Date Value  12/16/2012 <20   10/17/2012 13666*  07/08/2012 <20      CD4 T Cell Abs (cmm)  Date Value  12/16/2012 310*  10/30/2012 300*  10/17/2012 290*     BMET    Component Value Date/Time   NA 142 10/30/2012 1540   K 4.5 10/30/2012 1540  CL 108 10/30/2012 1540   CO2 27 10/30/2012 1540   GLUCOSE 95 10/30/2012 1540   BUN 22 10/30/2012 1540   CREATININE 1.46* 10/30/2012 1540   CREATININE 1.16 05/17/2010 2017   CALCIUM 9.1 10/30/2012 1540   GFRNONAA >60 04/12/2010 1304   GFRAA  Value: >60        The eGFR has been calculated using the MDRD equation. This calculation has not been validated in all clinical situations. eGFR's persistently <60 mL/min signify possible Chronic Kidney Disease. 04/12/2010 1304    Assessment: His HIV infection has come under much better control. I will continue  [name]. His acid reflux symptoms and dysphagia have resolved after restarting omeprazole. His blood pressures have been running too low it may be causing his fatigue. I will have him stop his lisinopril. He has had some mild renal insufficiency. I will recheck his chemistry panel after he has been off lisinopril.  Plan: 1. Continue [name] and omeprazole 2. Discontinue lisinopril and monitor blood pressures 3. Follow up after blood work in 3 months   [name], MD [facility] for [place] [phone] pager   [phone] cell 12/26/2012, 2:54 PM

## 2013-03-13 ENCOUNTER — Telehealth: Payer: Self-pay | Admitting: *Deleted

## 2013-03-14 NOTE — Telephone Encounter (Signed)
ROI processed and records faxed per protocol.  Pt has appt soon.

## 2013-03-24 ENCOUNTER — Telehealth: Payer: Self-pay | Admitting: *Deleted

## 2013-03-24 NOTE — Telephone Encounter (Signed)
Patient had labs done at the [facility] and wondered if we could use the same labs and nt have to stick him again. Advised him if he could get [place] a copy of the labs we could. He advised he will come by the office and sign a release so that we can get the labs from the [place] prior to his visit in December.

## 2013-03-27 ENCOUNTER — Other Ambulatory Visit (INDEPENDENT_AMBULATORY_CARE_PROVIDER_SITE_OTHER): Payer: Medicare PPO

## 2013-03-27 DIAGNOSIS — B20 Human immunodeficiency virus [HIV] disease: Secondary | ICD-10-CM

## 2013-03-28 LAB — HIV-1 RNA QUANT-NO REFLEX-BLD: HIV 1 RNA Quant: <20 copies/mL (ref <20)

## 2013-03-28 LAB — T-HELPER CELL (CD4) - (RCID CLINIC ONLY)
CD4 % Helper T Cell: 14 % — ABNORMAL LOW (ref 33–55)
CD4 T Cell Abs: 300 /uL — ABNORMAL LOW (ref 400–2700)

## 2013-04-09 ENCOUNTER — Telehealth: Payer: Self-pay | Admitting: *Deleted

## 2013-04-09 NOTE — Telephone Encounter (Signed)
Dr. Drema Dallas requested Dr. Megan Salon to call her 04/10/13.  The pt has an appointment with Dr. Megan Salon 04/10/13.

## 2013-04-10 ENCOUNTER — Ambulatory Visit (INDEPENDENT_AMBULATORY_CARE_PROVIDER_SITE_OTHER): Payer: Medicare PPO | Admitting: Internal Medicine

## 2013-04-10 ENCOUNTER — Encounter: Payer: Self-pay | Admitting: Internal Medicine

## 2013-04-10 VITALS — BP 162/94 | HR 80 | Temp 98.5°F | Ht 70.0 in | Wt 155.5 lb

## 2013-04-10 DIAGNOSIS — Z23 Encounter for immunization: Secondary | ICD-10-CM

## 2013-04-10 NOTE — Progress Notes (Signed)
Patient ID: Jeffrey [name], male   DOB: 1945/06/28, 67 y.o.   MRN: [id number]          [facility] for Infectious Disease  Patient Active Problem List   Diagnosis Date Noted  . HIV DISEASE 02/27/2006    Priority: High  . Dyslipidemia 07/22/2012    Priority: Medium  . Acute renal insufficiency 07/22/2012    Priority: Medium  . Erectile dysfunction 07/22/2012    Priority: Medium  . GERD 05/17/2010    Priority: Medium  . DYSPHAGIA UNSPECIFIED 03/08/2009    Priority: Medium  . HYPERTENSION 02/27/2006    Priority: Medium  . CHEST PAIN 05/17/2010  . CERVICAL LYMPHADENOPATHY 09/19/2007  . CUTANEOUS ERUPTIONS, DRUG-INDUCED 05/28/2006  . CHOLECYSTECTOMY, HX OF 05/28/2006  . ESOPHAGITIS 02/27/2006  . ABSCESS, NECK 02/27/2006  . PNEUMONIA, HX OF 02/27/2006  . ARTHROSCOPY, KNEE, HX OF 02/27/2006    Patient's Medications  New Prescriptions   No medications on file  Previous Medications   ACETAMINOPHEN (TYLENOL) 500 MG TABLET    Take 1 tablet (500 mg total) by mouth every 6 (six) hours as needed for pain.   ELVITEGRAVIR-COBICISTAT-EMTRICITABINE-TENOFOVIR ([name]) 150-150-200-300 MG TABS    Take 1 tablet by mouth daily with breakfast.   OMEPRAZOLE (PRILOSEC) 20 MG CAPSULE    Take 1 capsule (20 mg total) by mouth daily.  Modified Medications   No medications on file  Discontinued Medications   No medications on file    Subjective: Jeffrey [name] is in for his routine visit. He does not recall missing any doses of his [name]. Is not having any obvious problems tolerating it. He continues to take his omeprazole and is having less problems with acid reflux. He still occasionally has problems with dysphagia with solids but he relates that more to problems with not having dentures than anything else. His dysphasia and is not getting any worse. He was seen at the [facility] recently was told that they would not be able to help him obtain dentures.   I reviewed outside blood work from  the [place] and his latest creatinine was 1.6 which is relatively stable.   He feels better since stopping his lisinopril. He's been keeping a blood pressure journal over the last 3 months and the majority of his blood pressures are within the normal range.   Review of Systems: Constitutional: negative Eyes: negative Ears, nose, mouth, throat, and face: negative Respiratory: negative Cardiovascular: negative Gastrointestinal: positive for dyspepsia and mild stable dysphagia with solids, negative for abdominal pain, change in bowel habits, constipation, diarrhea, nausea and vomiting Genitourinary:negative  No past medical history on file.  History  Substance Use Topics  . Smoking status: Never Smoker   . Smokeless tobacco: Never Used  . Alcohol Use: No    No family history on file.  Allergies  Allergen Reactions  . Efavirenz   . Nevirapine     Objective: Temp: 98.5 F (36.9 C) (12/04 0948) Temp src: Oral (12/04 0948) BP: 162/94 mmHg (12/04 0948) Pulse Rate: 80 (12/04 0948)  General:  He is in no distress Oral:  Edentulous with no oropharyngeal lesions Skin:  No rash Lungs:  Clear Cor:  Regular S1 and S2 no murmurs Abdomen:  Nontender Joints and extremities:  No acute abnormalities Neuro:  Alert with normal speech and conversation mood and affect: Normal  Lab Results HIV 1 RNA Quant (copies/mL)  Date Value  03/27/2013 <20   12/16/2012 <20   10/17/2012 13666*     CD4  T Cell Abs (/uL)  Date Value  03/27/2013 300*  12/16/2012 310*  10/30/2012 300*     Lab Results  Component Value Date   WBC 4.7 10/30/2012   HGB 13.4 10/30/2012   HCT 39.7 10/30/2012   MCV 97.1 10/30/2012   PLT 152 10/30/2012   BMET    Component Value Date/Time   NA 142 10/30/2012 1540   K 4.5 10/30/2012 1540   CL 108 10/30/2012 1540   CO2 27 10/30/2012 1540   GLUCOSE 95 10/30/2012 1540   BUN 22 10/30/2012 1540   CREATININE 1.46* 10/30/2012 1540   CREATININE 1.16 05/17/2010 2017   CALCIUM 9.1  10/30/2012 1540   GFRNONAA >60 04/12/2010 1304   GFRAA  Value: >60        The eGFR has been calculated using the MDRD equation. This calculation has not been validated in all clinical situations. eGFR's persistently <60 mL/min signify possible Chronic Kidney Disease. 04/12/2010 1304   Lab Results  Component Value Date   ALT 33 10/30/2012   AST 31 10/30/2012   ALKPHOS 85 10/30/2012   BILITOT 0.3 10/30/2012   Lab Results  Component Value Date   CHOL 166 10/17/2012   HDL 44 10/17/2012   LDLCALC 103* 10/17/2012   TRIG 97 10/17/2012   CHOLHDL 3.8 10/17/2012   Assessment: His HIV infection is under very good control.  His GERD symptoms are under better control recently and no progression of his dysphasia to solids. He does not feel that he needs referral for repeat esophageal dilatation.  We'll work with our dental clinic here to see if there any resources to help him obtain dentures.  Although his blood pressure is elevated here today he has thorough blood pressure log suggests that his blood pressure is usually in at goal. I will continue to watch him off of antihypertensive therapy for now.  Plan: 1. Continue [name] 2. Continue omeprazole 3. Evaluate resources for helping obtain dentures 4. Monitor her blood pressure and creatinine 5. I would suggest that he have complete blood work at the [facility] in 3 months including CBC, complete metabolic panel, CD4 count, HIV viral load, lipid panel and RPR and followup with me shortly thereafter   [name], MD [facility] for [place] [phone] pager   [phone] cell 04/10/2013, 4:38 PM

## 2013-04-25 NOTE — Telephone Encounter (Signed)
I faxed records to Dr. Sharen Hones at the [facility]. A called her today but she will not be back for her for the next few weeks.

## 2013-06-25 ENCOUNTER — Other Ambulatory Visit: Payer: Medicare PPO

## 2013-06-25 ENCOUNTER — Other Ambulatory Visit: Payer: Self-pay | Admitting: Internal Medicine

## 2013-06-25 DIAGNOSIS — B20 Human immunodeficiency virus [HIV] disease: Secondary | ICD-10-CM

## 2013-06-26 LAB — T-HELPER CELL (CD4) - (RCID CLINIC ONLY)
CD4 % Helper T Cell: 20 % — ABNORMAL LOW (ref 33–55)
CD4 T CELL ABS: 400 /uL (ref 400–2700)

## 2013-06-26 LAB — HIV-1 RNA QUANT-NO REFLEX-BLD: HIV-1 RNA Quant, Log: <1.3 {Log} (ref <1.30)

## 2013-07-09 ENCOUNTER — Ambulatory Visit (INDEPENDENT_AMBULATORY_CARE_PROVIDER_SITE_OTHER): Payer: Medicare PPO | Admitting: Internal Medicine

## 2013-07-09 ENCOUNTER — Encounter: Payer: Self-pay | Admitting: Internal Medicine

## 2013-07-09 VITALS — BP 148/105 | HR 98 | Temp 98.1°F | Ht 70.0 in | Wt 157.2 lb

## 2013-07-09 DIAGNOSIS — E785 Hyperlipidemia, unspecified: Secondary | ICD-10-CM

## 2013-07-09 NOTE — Progress Notes (Signed)
Patient ID: Jeffrey [name], male   DOB: [birth year], 68 y.o.   MRN: [id number]          [facility] for Infectious Disease  Patient Active Problem List   Diagnosis Date Noted  . HIV DISEASE 02/27/2006    Priority: High  . Dyslipidemia 07/22/2012    Priority: Medium  . Acute renal insufficiency 07/22/2012    Priority: Medium  . Erectile dysfunction 07/22/2012    Priority: Medium  . GERD 05/17/2010    Priority: Medium  . DYSPHAGIA UNSPECIFIED 03/08/2009    Priority: Medium  . HYPERTENSION 02/27/2006    Priority: Medium  . CHEST PAIN 05/17/2010  . CERVICAL LYMPHADENOPATHY 09/19/2007  . CUTANEOUS ERUPTIONS, DRUG-INDUCED 05/28/2006  . CHOLECYSTECTOMY, HX OF 05/28/2006  . ESOPHAGITIS 02/27/2006  . ABSCESS, NECK 02/27/2006  . PNEUMONIA, HX OF 02/27/2006  . ARTHROSCOPY, KNEE, HX OF 02/27/2006    Patient's Medications  New Prescriptions   No medications on file  Previous Medications   ACETAMINOPHEN (TYLENOL) 500 MG TABLET    Take 1 tablet (500 mg total) by mouth every 6 (six) hours as needed for pain.   ELVITEGRAVIR-COBICISTAT-EMTRICITABINE-TENOFOVIR ([name]) 150-150-200-300 MG TABS    Take 1 tablet by mouth daily with breakfast.   OMEPRAZOLE (PRILOSEC) 20 MG CAPSULE    Take 1 capsule (20 mg total) by mouth daily.  Modified Medications   No medications on file  Discontinued Medications   No medications on file    Subjective: [name] is in for his routine visit. Has not missed any doses of his [name] since his last visit. He is tolerating it well. His not having any reflux symptoms but continues to take his Prilosec daily. He's not had any trial off proton pump inhibitor therapy recently. He is not having any dysphagia.  He continues to keep his blood pressure log. His blood pressures are within goal range in the morning but occasionally are elevated in the afternoon with diastolics in the 67-672 range. He had a neighbor who works as a Marine scientist checked his blood pressure  simultaneously with her blood pressure cuff and it was the same as his automated cuff at home.  Review of Systems: Pertinent items are noted in HPI.  No past medical history on file.  History  Substance Use Topics  . Smoking status: Never Smoker   . Smokeless tobacco: Never Used  . Alcohol Use: No    No family history on file.  Allergies  Allergen Reactions  . Efavirenz   . Nevirapine     Objective: Temp: 98.1 F (36.7 C) (03/04 1341) Temp src: Oral (03/04 1341) BP: 148/105 mmHg (03/04 1341) Pulse Rate: 98 (03/04 1341)  Body mass index is 22.56 kg/(m^2).  General: He is well dressed and in good spirits  Oral: No oropharyngeal lesions. He has not been able to afford dentures yet  Skin: No rash  Lungs: Clear  Cor: Regular S1 and S2 no murmurs Abdomen: Soft and nontender    Lab Results Lab Results  Component Value Date   WBC 4.7 10/30/2012   HGB 13.4 10/30/2012   HCT 39.7 10/30/2012   MCV 97.1 10/30/2012   PLT 152 10/30/2012    Lab Results  Component Value Date   CREATININE 1.46* 10/30/2012   BUN 22 10/30/2012   NA 142 10/30/2012   K 4.5 10/30/2012   CL 108 10/30/2012   CO2 27 10/30/2012    Lab Results  Component Value Date   ALT 33 10/30/2012   AST  31 10/30/2012   ALKPHOS 85 10/30/2012   BILITOT 0.3 10/30/2012    Lab Results  Component Value Date   CHOL 166 10/17/2012   HDL 44 10/17/2012   LDLCALC 103* 10/17/2012   TRIG 97 10/17/2012   CHOLHDL 3.8 10/17/2012    Lab Results HIV 1 RNA Quant (copies/mL)  Date Value  06/25/2013 <20   03/27/2013 <20   12/16/2012 <20      CD4 T Cell Abs (/uL)  Date Value  06/25/2013 400   03/27/2013 300*  12/16/2012 310*     Assessment:  his HIV infection is under excellent control. He's had good CD4 reconstitution since being on [name] over the past 10 months.   He developed some renal insufficiency prior to starting [name]. His creatinine in March of 2014 was up to 1.6. His creatinine has improved. Some of his current  elevation and measured creatinine could be do to cobicistat in his [name]. I will check an actual GFR and phosphorus level before his next visit in 3 months.  He has borderline hypertension. He will be seen his physician at the [facility] next month and wants to review blood pressure readings with her.  His acid reflux symptoms are quiescent. I suggested that he have a brief trial off of proton pump inhibitors to [place] he does.  Plan: 1. Continue [name]  2. Consider trial off of Prilosec  3. Check lipid panel today 4. Follow up here after lab work including actual GFR and phosphorus level in 3 months   [name], [place] for [place] [phone] pager   [phone] cell 07/09/2013, 2:43 PM

## 2013-07-10 LAB — LIPID PANEL
Cholesterol: 173 mg/dL (ref 0–200)
HDL: 44 mg/dL (ref >39)
LDL CALC: 108 mg/dL — AB (ref 0–99)
Total CHOL/HDL Ratio: 3.9 Ratio
Triglycerides: 106 mg/dL (ref <150)
VLDL: 21 mg/dL (ref 0–40)

## 2013-07-31 ENCOUNTER — Telehealth: Payer: Self-pay | Admitting: *Deleted

## 2013-07-31 DIAGNOSIS — B20 Human immunodeficiency virus [HIV] disease: Secondary | ICD-10-CM

## 2013-07-31 NOTE — Telephone Encounter (Signed)
The [place] in [place], [place] is not connected to [facility] EPIC.  Pt stated that the [place] did not do any labwork prior or during this latest visit.  Decisions were made based on labwork that was previously faxed from [place].  Appt made for August 07, 2013 @ [place] w/ Dr Megan Salon.

## 2013-07-31 NOTE — Telephone Encounter (Signed)
Please see if you can get the password to enter his [place] site and check his most recent creatinine and see if he can come see me next week.

## 2013-07-31 NOTE — Telephone Encounter (Signed)
Pt shared that ID MD at [facility] wants to stop [name] due to kidney lab work.  Wants to start pt on darunavir only regimen.  Pt would like input from Dr. Megan Salon on this change.  Pt only has enough [name] until 08/05/13.  Pt will reorder [name] until this is resolved.  MD please advise.  Pt would appreciate a call.  Pt stated that the MD he saw was not Dr. Drema Dallas but a Dr. Dawna Part.

## 2013-08-07 ENCOUNTER — Ambulatory Visit (INDEPENDENT_AMBULATORY_CARE_PROVIDER_SITE_OTHER): Payer: Commercial Managed Care - HMO | Admitting: Internal Medicine

## 2013-08-07 ENCOUNTER — Other Ambulatory Visit: Payer: Self-pay | Admitting: Internal Medicine

## 2013-08-07 ENCOUNTER — Ambulatory Visit
Admission: RE | Admit: 2013-08-07 | Discharge: 2013-08-07 | Disposition: A | Discharge status: Home / Self Care | Payer: Commercial Managed Care - HMO | Source: Ambulatory Visit | Attending: Internal Medicine | Admitting: Internal Medicine

## 2013-08-07 ENCOUNTER — Encounter: Payer: Self-pay | Admitting: Internal Medicine

## 2013-08-07 VITALS — BP 147/88 | HR 68 | Temp 97.6°F | Ht 70.0 in | Wt 157.5 lb

## 2013-08-07 DIAGNOSIS — Z23 Encounter for immunization: Secondary | ICD-10-CM

## 2013-08-07 DIAGNOSIS — M25572 Pain in left ankle and joints of left foot: Secondary | ICD-10-CM

## 2013-08-07 DIAGNOSIS — M25579 Pain in unspecified ankle and joints of unspecified foot: Secondary | ICD-10-CM

## 2013-08-07 DIAGNOSIS — N289 Disorder of kidney and ureter, unspecified: Secondary | ICD-10-CM

## 2013-08-07 DIAGNOSIS — B20 Human immunodeficiency virus [HIV] disease: Secondary | ICD-10-CM

## 2013-08-07 LAB — BASIC METABOLIC PANEL WITH GFR
BUN: 20 mg/dL (ref 6–23)
CHLORIDE: 108 meq/L (ref 96–112)
CO2: 26 meq/L (ref 19–32)
Calcium: 8.9 mg/dL (ref 8.4–10.5)
Creat: 1.67 mg/dL — ABNORMAL HIGH (ref 0.50–1.35)
GFR, Est African American: 49 mL/min — ABNORMAL LOW
GFR, Est Non African American: 42 mL/min — ABNORMAL LOW
Glucose, Bld: 80 mg/dL (ref 70–99)
POTASSIUM: 3.6 meq/L (ref 3.5–5.3)
SODIUM: 140 meq/L (ref 135–145)

## 2013-08-07 NOTE — Progress Notes (Addendum)
Patient ID: Jeffrey [name], male   DOB: 11-04-1945, 68 y.o.   MRN: [id number]         Patient Active Problem List   Diagnosis Date Noted  . HIV DISEASE 02/27/2006    Priority: High  . Left ankle pain 08/07/2013    Priority: Medium  . Dyslipidemia 07/22/2012    Priority: Medium  . Acute renal insufficiency 07/22/2012    Priority: Medium  . Erectile dysfunction 07/22/2012    Priority: Medium  . GERD 05/17/2010    Priority: Medium  . DYSPHAGIA UNSPECIFIED 03/08/2009    Priority: Medium  . HYPERTENSION 02/27/2006    Priority: Medium  . CHEST PAIN 05/17/2010  . CERVICAL LYMPHADENOPATHY 09/19/2007  . CUTANEOUS ERUPTIONS, DRUG-INDUCED 05/28/2006  . CHOLECYSTECTOMY, HX OF 05/28/2006  . ESOPHAGITIS 02/27/2006  . ABSCESS, NECK 02/27/2006  . PNEUMONIA, HX OF 02/27/2006  . ARTHROSCOPY, KNEE, HX OF 02/27/2006    Patient's Medications  New Prescriptions   No medications on file  Previous Medications   ACETAMINOPHEN (TYLENOL) 500 MG TABLET    Take 1 tablet (500 mg total) by mouth every 6 (six) hours as needed for pain.   ELVITEGRAVIR-COBICISTAT-EMTRICITABINE-TENOFOVIR ([name]) 150-150-200-300 MG TABS    Take 1 tablet by mouth daily with breakfast.   OMEPRAZOLE (PRILOSEC) 20 MG CAPSULE    Take 1 capsule (20 mg total) by mouth daily.  Modified Medications   No medications on file  Discontinued Medications   No medications on file    Subjective: Jeffrey [name] is in to review his antiretroviral regimen. I simplify his regimen to [name] in March of last year. He had mild renal insufficiency noted just before starting [name]. His creatinine did normalize but his last 2 measurements have been slightly elevated. He was seen by Dr. Lannette Donath at the [facility] clinic recently. No blood work was done there. Dr. Caryn Section recommended stopping [name] the cause of the elevations of his creatinine.  He has noted some pain in his left foot that has been progressively worse over the past few months. He denies  any injury to his foot. His pain is over the dorsum of his foot and is worse when he is walking. He finds that wearing boots helps with the pain.  Review of Systems: Pertinent items are noted in HPI.  No past medical history on file.  History  Substance Use Topics  . Smoking status: Never Smoker   . Smokeless tobacco: Never Used  . Alcohol Use: No    No family history on file.  Allergies  Allergen Reactions  . Efavirenz   . Nevirapine     Objective: Temp: 97.6 F (36.4 C) (04/02 0922) Temp src: Oral (04/02 0922) BP: 147/88 mmHg (04/02 0922) Pulse Rate: 68 (04/02 0922) Body mass index is 22.6 kg/(m^2).  General: He is smiling and in no distress Oral: No oropharyngeal lesions Skin: No rash Lungs: Clear Cor: Regular S1 and S2 with no murmurs Joints and extremities: No visible abnormalities to his left foot. There is no swelling, redness, warmth or tenderness with palpation. His pulses are normal  Lab Results Lab Results  Component Value Date   WBC 4.7 10/30/2012   HGB 13.4 10/30/2012   HCT 39.7 10/30/2012   MCV 97.1 10/30/2012   PLT 152 10/30/2012    Lab Results  Component Value Date   CREATININE 1.46* 10/30/2012   BUN 22 10/30/2012   NA 142 10/30/2012   K 4.5 10/30/2012   CL 108 10/30/2012   CO2 27 10/30/2012  Lab Results  Component Value Date   ALT 33 10/30/2012   AST 31 10/30/2012   ALKPHOS 85 10/30/2012   BILITOT 0.3 10/30/2012    Lab Results  Component Value Date   CHOL 173 07/09/2013   HDL 44 07/09/2013   LDLCALC 108* 07/09/2013   TRIG 106 07/09/2013   CHOLHDL 3.9 07/09/2013    Lab Results HIV 1 RNA Quant (copies/mL)  Date Value  06/25/2013 <20   03/27/2013 <20   12/16/2012 <20      CD4 T Cell Abs (/uL)  Date Value  06/25/2013 400   03/27/2013 300*  12/16/2012 310*     Assessment: He has very mild, stable renal insufficiency predating the switch to [name]. I will repeat his creatinine with a GFR today. As long as his renal insufficiency is mild and  stable I am comfortable continuing [name].  I'm not sure what is causing his left foot pain. His history does not sound like neuropathic pain. I will check a plain x-ray to rule out bone spurs and fractures.  Plan: 1. Continue [name] for now 2. Check basic metabolic panel with GFR 3. Left ankle x-ray 4. I will call him tomorrow with results   [name], MD [facility] for [place] [phone] pager   [phone] cell 08/07/2013, 10:31 AM  Addendum:  LEFT ANKLE COMPLETE - 3+ VIEW 08/07/2013   FINDINGS:  No acute fracture is seen. The ankle joint appears normal. Alignment  is normal. No significant degenerative change is noted.  IMPRESSION:  Negative.    By: Ivar Drape M.D.  On: 08/07/2013 10:57  BMET    Component Value Date/Time   NA 140 08/07/2013 1140   K 3.6 08/07/2013 1140   CL 108 08/07/2013 1140   CO2 26 08/07/2013 1140   GLUCOSE 80 08/07/2013 1140   BUN 20 08/07/2013 1140   CREATININE 1.67* 08/07/2013 1140   CREATININE 1.16 05/17/2010 2017   CALCIUM 8.9 08/07/2013 1140   GFRNONAA 42* 08/07/2013 1140   GFRNONAA >60 04/12/2010 1304   GFRAA 49* 08/07/2013 1140   GFRAA  Value: >60        The eGFR has been calculated using the MDRD equation. This calculation has not been validated in all clinical situations. eGFR's persistently <60 mL/min signify possible Chronic Kidney Disease. 04/12/2010 1304   The x-rays do not reveal any obvious source for his left ankle pain. He is getting relief from intermittent extra strength Tylenol I told him he could continue to take that as needed. His creatinine has gone up slightly again. We have placed a call to the HIV treatment Hotline in [place] and are waiting their input about the use of [name] in patients with mild renal insufficiency. I told Jeffrey [name] I would call him about 10 days once I have their advice.  [name], MD [facility] for [place] [facility] [phone]  pager   [phone] cell 08/08/2013, 2:08 PM

## 2013-09-01 ENCOUNTER — Telehealth: Payer: Self-pay | Admitting: *Deleted

## 2013-09-01 NOTE — Telephone Encounter (Signed)
Left message asking him to confirm tomorrow's appointment.

## 2013-09-01 NOTE — Telephone Encounter (Signed)
Overbook him with me tomorrow.

## 2013-09-01 NOTE — Telephone Encounter (Signed)
Patient called asking if he should refill his [name] or if his regimen will be switched. Patient needs refills this week. Additionally, patient is asking for advice about his left ankle pain.  He states that the tylenol is only lasting about 4.5 hours.  He would like to know 1) is anything wrong with his foot and 2) what can he do about the pain? Please advise. Landis Gandy, RN

## 2013-09-02 ENCOUNTER — Telehealth: Payer: Self-pay | Admitting: Internal Medicine

## 2013-09-02 ENCOUNTER — Encounter: Payer: Self-pay | Admitting: Internal Medicine

## 2013-09-02 ENCOUNTER — Ambulatory Visit (INDEPENDENT_AMBULATORY_CARE_PROVIDER_SITE_OTHER): Payer: Commercial Managed Care - HMO | Admitting: Internal Medicine

## 2013-09-02 VITALS — BP 164/89 | HR 66 | Temp 97.8°F | Ht 70.0 in | Wt 156.0 lb

## 2013-09-02 DIAGNOSIS — B20 Human immunodeficiency virus [HIV] disease: Secondary | ICD-10-CM

## 2013-09-02 DIAGNOSIS — M25579 Pain in unspecified ankle and joints of unspecified foot: Secondary | ICD-10-CM

## 2013-09-02 LAB — BASIC METABOLIC PANEL WITH GFR
BUN: 22 mg/dL (ref 6–23)
CHLORIDE: 110 meq/L (ref 96–112)
CO2: 25 mEq/L (ref 19–32)
Calcium: 8.8 mg/dL (ref 8.4–10.5)
Creat: 1.69 mg/dL — ABNORMAL HIGH (ref 0.50–1.35)
GFR, Est African American: 48 mL/min — ABNORMAL LOW
GFR, Est Non African American: 42 mL/min — ABNORMAL LOW
GLUCOSE: 91 mg/dL (ref 70–99)
POTASSIUM: 3.8 meq/L (ref 3.5–5.3)
Sodium: 139 mEq/L (ref 135–145)

## 2013-09-02 MED ORDER — ABACAVIR-DOLUTEGRAVIR-LAMIVUD 600-50-300 MG PO TABS: 1.0000 | ORAL_TABLET | Freq: Every day | ORAL | Status: DC

## 2013-09-02 NOTE — Progress Notes (Signed)
Patient ID: Jeffrey [name], male   DOB: 1945-06-08, 68 y.o.   MRN: [id number] @LOGODEPT         Patient Active Problem List   Diagnosis Date Noted  . HIV DISEASE 02/27/2006    Priority: High  . Left ankle pain 08/07/2013    Priority: Medium  . Dyslipidemia 07/22/2012    Priority: Medium  . Acute renal insufficiency 07/22/2012    Priority: Medium  . Erectile dysfunction 07/22/2012    Priority: Medium  . GERD 05/17/2010    Priority: Medium  . DYSPHAGIA UNSPECIFIED 03/08/2009    Priority: Medium  . HYPERTENSION 02/27/2006    Priority: Medium  . CHEST PAIN 05/17/2010  . CERVICAL LYMPHADENOPATHY 09/19/2007  . CUTANEOUS ERUPTIONS, DRUG-INDUCED 05/28/2006  . CHOLECYSTECTOMY, HX OF 05/28/2006  . ESOPHAGITIS 02/27/2006  . ABSCESS, NECK 02/27/2006  . PNEUMONIA, HX OF 02/27/2006  . ARTHROSCOPY, KNEE, HX OF 02/27/2006    Patient's Medications  New Prescriptions   No medications on file  Previous Medications   ACETAMINOPHEN (TYLENOL) 500 MG TABLET    Take 1 tablet (500 mg total) by mouth every 6 (six) hours as needed for pain.   ELVITEGRAVIR-COBICISTAT-EMTRICITABINE-TENOFOVIR ([name]) 150-150-200-300 MG TABS    Take 1 tablet by mouth daily with breakfast.   OMEPRAZOLE (PRILOSEC) 20 MG CAPSULE    Take 1 capsule (20 mg total) by mouth daily.  Modified Medications   No medications on file  Discontinued Medications   No medications on file    Subjective: Jeffrey [name] is seen on a work in basis. His current prescription for [name] runs out tomorrow if he wants to know if he should refill it. His renal function has been intermittently abnormal over the past year and his last creatinine several weeks ago was up to 1.67.  He is also having more left ankle pain. This began several months ago. He did not have any injury. His pain is most noticeable over the dorsum of his proximal left foot extending up into the ankle. It is worse when he is standing and walking. He has not noted any redness. He  feels like his foot is slightly swollen. The pain is limiting his activity. He has been taking 2 extra strength Tylenol twice a day with some relief.  Review of Systems: Constitutional: negative Eyes: negative Ears, nose, mouth, throat, and face: negative Respiratory: negative Cardiovascular: negative Gastrointestinal: negative Genitourinary:negative Musculoskeletal:positive for arthralgias  No past medical history on file.  History  Substance Use Topics  . Smoking status: Never Smoker   . Smokeless tobacco: Never Used  . Alcohol Use: No    No family history on file.  Allergies  Allergen Reactions  . Efavirenz   . Nevirapine     Objective: Temp: 97.8 F (36.6 C) (04/28 0950) Temp src: Oral (04/28 0950) BP: 164/89 mmHg (04/28 0950) Pulse Rate: 66 (04/28 0950) Body mass index is 22.38 kg/(m^2).  General: He is in good spirits Oral: No oropharyngeal lesions Skin: No rash Lungs: Clear Cor: Regular S1 and S2 no murmurs Abdomen: Nontender Joints and extremities: I see no obvious abnormalities of his left foot or ankle. He has good pulses. He has good sensation. There is no swelling, redness or unusual warmth. He points out to where his pain is located but his pain is not reproduced with palpation. Neuro: Alert with normal speech and conversation Mood and affect: Normal  Lab Results Lab Results  Component Value Date   WBC 4.7 10/30/2012   HGB 13.4 10/30/2012  HCT 39.7 10/30/2012   MCV 97.1 10/30/2012   PLT 152 10/30/2012    Lab Results  Component Value Date   CREATININE 1.67* 08/07/2013   BUN 20 08/07/2013   NA 140 08/07/2013   K 3.6 08/07/2013   CL 108 08/07/2013   CO2 26 08/07/2013    Lab Results  Component Value Date   ALT 33 10/30/2012   AST 31 10/30/2012   ALKPHOS 85 10/30/2012   BILITOT 0.3 10/30/2012    Lab Results  Component Value Date   CHOL 173 07/09/2013   HDL 44 07/09/2013   LDLCALC 108* 07/09/2013   TRIG 106 07/09/2013   CHOLHDL 3.9 07/09/2013    Lab  Results HIV 1 RNA Quant (copies/mL)  Date Value  06/25/2013 <20   03/27/2013 <20   12/16/2012 <20      CD4 T Cell Abs (/uL)  Date Value  06/25/2013 400   03/27/2013 300*  12/16/2012 310*     Assessment: His HIV infection is well controlled but I will recheck his creatinine in GFR today before making a decision about continuing [name].  I am not sure what is causing his progressive left ankle pain.  Plan: 1. Continue [name] for now 2. Check basic metabolic panel GFR 3. Sports medicine referral   [name], MD [facility] for [facility] [phone] pager   [phone] cell 09/02/2013, 10:11 AM

## 2013-09-02 NOTE — Telephone Encounter (Signed)
BMET    Component Value Date/Time   NA 139 09/02/2013 1148   K 3.8 09/02/2013 1148   CL 110 09/02/2013 1148   CO2 25 09/02/2013 1148   GLUCOSE 91 09/02/2013 1148   BUN 22 09/02/2013 1148   CREATININE 1.69* 09/02/2013 1148   CREATININE 1.16 05/17/2010 2017   CALCIUM 8.8 09/02/2013 1148   GFRNONAA 42* 09/02/2013 1148   GFRNONAA >60 04/12/2010 1304   GFRAA 48* 09/02/2013 1148   GFRAA  Value: >60        The eGFR has been calculated using the MDRD equation. This calculation has not been validated in all clinical situations. eGFR's persistently <60 mL/min signify possible Chronic Kidney Disease. 04/12/2010 1304   Nidal' creatinine continues to creep up gradually. Although it was up before beginning [name] one year ago I'm still concerned that the tenofovir and cobicistat components of [name] could be affecting his creatinine now. I will change [name] to Triumeq and monitor creatinine closely.

## 2013-09-15 ENCOUNTER — Ambulatory Visit (INDEPENDENT_AMBULATORY_CARE_PROVIDER_SITE_OTHER): Payer: Commercial Managed Care - HMO | Admitting: Sports Medicine

## 2013-09-15 ENCOUNTER — Encounter: Payer: Self-pay | Admitting: Sports Medicine

## 2013-09-15 VITALS — BP 139/89 | Ht 70.0 in | Wt 157.9 lb

## 2013-09-15 DIAGNOSIS — M25572 Pain in left ankle and joints of left foot: Secondary | ICD-10-CM

## 2013-09-15 DIAGNOSIS — M25579 Pain in unspecified ankle and joints of unspecified foot: Secondary | ICD-10-CM

## 2013-09-15 NOTE — Progress Notes (Signed)
   Subjective:    Patient ID: Jeffrey [name], male    DOB: [birth year], 68 y.o.   MRN: [id number]  HPI chief complaint: Left ankle and foot pain  Very pleasant 68 year old male comes in today complaining of 1 year of left ankle and foot pain. No trauma but rather a gradual onset of pain that he notices primarily with extreme dorsi flexion of the foot. He has had persistent swelling for the past year as well. He localizes most of his pain along the anterior lateral joint line with radiating discomfort down the dorsum of his foot. He states that there are times that he is pain-free but other times his symptoms are quite excruciating. No mechanical symptoms. He has a history of HIV and is followed by Dr. Megan Salon who has referred him to our office for further evaluation and treatment. X-rays of the left ankle ordered recently by Dr. Megan Salon are unremarkable.  Past medical history review Current medications reviewed Allergies reviewed Denies alcohol use and denies tobacco use    Review of Systems     Objective:   Physical Exam Well-developed, well-nourished. No acute distress. Awake alert and oriented x3. Vital signs reviewed.  Left ankle: There is extreme pain with dorsi flexion. Minimal pain with plantar flexion. There is pain with active eversion. Trace joint effusion. Diffuse soft tissue swelling of the dorsum of the foot as well. The skin is slightly erythematous when compared to the uninvolved right foot and ankle but is not warm to touch. There is slight tenderness to palpation along the extensor digitorum tendon but most of his discomfort is underneath his along the anterior lateral joint line. No tenderness to palpation along the peroneal tendons. No tenderness to palpation along the posterior tibialis tendon. Good dorsalis pedis and posterior tibial pulses. Walking with a slight limp.       Assessment & Plan:  Chronic left ankle pain-rule out occult OCD versus extensor digitorum  tendinopathy  Given his length of symptoms and joint effusion coupled with normal radiographs I think we should pursue further diagnostic imaging in the form of an MRI scan to better evaluate the articular surface of the ankle as well as the surrounding soft tissue structures. I will call the patient after I've reviewed that study at which point we will delineate a more definitive treatment plan. We did discuss the possibility of arch supports but he wants to wait on that for now.

## 2013-09-19 ENCOUNTER — Inpatient Hospital Stay: Admission: RE | Admit: 2013-09-19 | Payer: Commercial Managed Care - HMO | Source: Ambulatory Visit

## 2013-09-22 ENCOUNTER — Telehealth: Payer: Self-pay | Admitting: *Deleted

## 2013-09-22 DIAGNOSIS — M25572 Pain in left ankle and joints of left foot: Secondary | ICD-10-CM

## 2013-09-22 NOTE — Telephone Encounter (Signed)
I agree with an orthopedic referral.

## 2013-09-22 NOTE — Telephone Encounter (Signed)
Attempted to refer patient to Dr. Sharol Given and was informed that because patient has [facility] insurance the referral has to come from PCP on card, which is Dr. Dorcas Mcmurray. He will need to establish with that office for them to do the referral. Informed patient and he will call that office. [name]

## 2013-09-22 NOTE — Telephone Encounter (Signed)
Patient called stating he was seen by sports medicine and an MRI was ordered on his ankle. Insurance is not wanting to approve it, but if they do he said his copay will be $250 and he can not afford this. Sports Medicine has suggested a referral to orthopedic, but patient said it needs to come from his primary care MD and that is Dr. Megan Salon. Please advise  [name]

## 2013-09-22 NOTE — Telephone Encounter (Signed)
Per draper:  Lets refer him to dr duda at [facility] to assess. Informed pt he had to call dr campbells office to refer him to dr duda because of his [employer] insurance. Pt in agreed and called the office to make the appt

## 2013-09-24 ENCOUNTER — Other Ambulatory Visit: Payer: Commercial Managed Care - HMO

## 2013-09-25 ENCOUNTER — Other Ambulatory Visit: Payer: Medicare PPO

## 2013-09-25 DIAGNOSIS — B20 Human immunodeficiency virus [HIV] disease: Secondary | ICD-10-CM

## 2013-09-25 DIAGNOSIS — Z79899 Other long term (current) drug therapy: Secondary | ICD-10-CM

## 2013-09-25 LAB — CBC WITH DIFFERENTIAL/PLATELET
BASOS PCT: 0 % (ref 0–1)
Basophils Absolute: 0 10*3/uL (ref 0.0–0.1)
EOS ABS: 0.1 10*3/uL (ref 0.0–0.7)
Eosinophils Relative: 2 % (ref 0–5)
HCT: 41.1 % (ref 39.0–52.0)
HEMOGLOBIN: 14 g/dL (ref 13.0–17.0)
Lymphocytes Relative: 40 % (ref 12–46)
Lymphs Abs: 1.3 10*3/uL (ref 0.7–4.0)
MCH: 34.3 pg — AB (ref 26.0–34.0)
MCHC: 34.1 g/dL (ref 30.0–36.0)
MCV: 100.7 fL — ABNORMAL HIGH (ref 78.0–100.0)
MONOS PCT: 11 % (ref 3–12)
Monocytes Absolute: 0.4 10*3/uL (ref 0.1–1.0)
NEUTROS ABS: 1.6 10*3/uL — AB (ref 1.7–7.7)
Neutrophils Relative %: 47 % (ref 43–77)
Platelets: 184 10*3/uL (ref 150–400)
RBC: 4.08 MIL/uL — ABNORMAL LOW (ref 4.22–5.81)
RDW: 14 % (ref 11.5–15.5)
WBC: 3.3 10*3/uL — ABNORMAL LOW (ref 4.0–10.5)

## 2013-09-25 LAB — COMPREHENSIVE METABOLIC PANEL
ALBUMIN: 3.8 g/dL (ref 3.5–5.2)
ALT: 19 U/L (ref 0–53)
AST: 19 U/L (ref 0–37)
Alkaline Phosphatase: 94 U/L (ref 39–117)
BUN: 18 mg/dL (ref 6–23)
CALCIUM: 9.2 mg/dL (ref 8.4–10.5)
CHLORIDE: 106 meq/L (ref 96–112)
CO2: 27 meq/L (ref 19–32)
CREATININE: 1.86 mg/dL — AB (ref 0.50–1.35)
Glucose, Bld: 101 mg/dL — ABNORMAL HIGH (ref 70–99)
Potassium: 3.8 mEq/L (ref 3.5–5.3)
Sodium: 138 mEq/L (ref 135–145)
Total Bilirubin: 0.3 mg/dL (ref 0.2–1.2)
Total Protein: 6.3 g/dL (ref 6.0–8.3)

## 2013-09-25 LAB — PHOSPHORUS: PHOSPHORUS: 1.5 mg/dL — AB (ref 2.3–4.6)

## 2013-09-26 LAB — HIV-1 RNA QUANT-NO REFLEX-BLD: HIV 1 RNA Quant: <20 copies/mL (ref <20)

## 2013-09-26 LAB — T-HELPER CELL (CD4) - (RCID CLINIC ONLY)
CD4 % Helper T Cell: 22 % — ABNORMAL LOW (ref 33–55)
CD4 T Cell Abs: 330 /uL — ABNORMAL LOW (ref 400–2700)

## 2013-10-07 ENCOUNTER — Encounter: Payer: Self-pay | Admitting: Family Medicine

## 2013-10-07 ENCOUNTER — Ambulatory Visit (INDEPENDENT_AMBULATORY_CARE_PROVIDER_SITE_OTHER): Payer: Commercial Managed Care - HMO | Admitting: Family Medicine

## 2013-10-07 VITALS — BP 150/81 | HR 90 | Temp 98.0°F | Wt 149.0 lb

## 2013-10-07 DIAGNOSIS — K219 Gastro-esophageal reflux disease without esophagitis: Secondary | ICD-10-CM

## 2013-10-07 DIAGNOSIS — M25579 Pain in unspecified ankle and joints of unspecified foot: Secondary | ICD-10-CM

## 2013-10-07 DIAGNOSIS — M25572 Pain in left ankle and joints of left foot: Secondary | ICD-10-CM

## 2013-10-07 DIAGNOSIS — B20 Human immunodeficiency virus [HIV] disease: Secondary | ICD-10-CM

## 2013-10-07 DIAGNOSIS — Z1211 Encounter for screening for malignant neoplasm of colon: Secondary | ICD-10-CM

## 2013-10-07 DIAGNOSIS — I10 Essential (primary) hypertension: Secondary | ICD-10-CM

## 2013-10-07 NOTE — Assessment & Plan Note (Addendum)
Chronic gradual worsening, given h/o pain/edema on activity with dorsiflexion, suspect MSK etiology (stress fracture, osteochondritis, tendonopathy), less likely osteomyelitis (afebrile, no ulcers / lesions triggering). - Recent X-ray negative (08/2013) - Followed by [facility], recently seen with orders for MRI L-foot/ankle w/o contrast (not completed d/t insurance)  Plan: 1. Ordered Left ankle / foot MRI without contrast (given chronic kidney disease). Once authorized by insurance / patient to be contacted regarding scheduled time. 2. Recommend continue f/u with [facility] with results of MRI 3. If indicated, would refer patient to Orthopedic surgery pending results 4. Continue conservative management. Declines additional pain medicine rx at this time. 5. RTC 1-2 months, or PRN

## 2013-10-07 NOTE — Patient Instructions (Signed)
Dear Jeffrey [name], Thank you for coming in to clinic today. It was good to meet you!  Today we discussed your Left ankle / foot pain. 1. We will go ahead and proceed with an MRI of your left ankle and foot. This will look for multiple potential causes of your pain, possibly a tendon tear, stress fracture, or unlikely infection in the bone. 2. MRI will need to be authorized by your insurance, and then it will be scheduled, you will be notified at that time. 3. Once completed, we will recommend that you call Sports Medicine Clinic to arrange follow-up to discuss the results. At that time, they can talk about treatment and possible further referral to Orthopedics if needed. 4. In the meantime, continue to take Tylenol as needed, try stretching your foot if it provides any relief.  Hand out given on Colonoscopy scheduling for colon cancer screening.  Some important numbers from today's visit: BP - 150/80  Please schedule a follow-up appointment with me as needed, recommend annual follow-up. Call and come back sooner if you need follow-up on your left ankle and need further referral.  If you have any other questions or concerns, please feel free to call the clinic to contact me. You may also schedule an earlier appointment if necessary.  However, if your symptoms get significantly worse, please go to the Emergency Department to seek immediate medical attention.  Nobie Putnam, [place]

## 2013-10-07 NOTE — Progress Notes (Signed)
Subjective:     Patient ID: Jeffrey [name], male   DOB: Nov 02, 1945, 68 y.o.   MRN: [id number]  Patient presents for a new patient visit.  HPI  Left foot pain, chronic: - Followed by [facility] (last seen 09/15/13), reviewed recent L-ankle X-rays (08/07/13) negative for fracture or other etiology of pain, [facility] had ordered MRI L-ankle for further eval, however MRI refused, required primary care referral / order of MRI - Reports chronic dorsal left foot pain x 1 year, gradual onset, progressive worsening - Pain primarily worse with walking / significant dorsiflexion, otherwise minimal pain while resting and not using foot. Admits to intermittent occasional flares with mid-foot swelling. - Denies associated numbness, tingling, weakness, redness - Tried Tylenol, initially Aleve (since [place] per Dr. Megan Salon due to kidney function), ice, topicals w/o significant relief - Additionally, h/o plantar fasciitis 2004 (improved with pain meds, stretching), reports different pain with pain in sole of foot vs current episode of dorsum - Admits to impairment with daily function, however still remains active despite pain.  HIV: - Followed by Dr. Megan Salon, infectious disease - Currently on Triumeq x 4 months, plans to continue at present time - Regularly followed for lab-work at HIV clinic. No other recent med changes. - Denies any significant HIV complications - Denies fever/chills, abd pain, cough, CP, SOB, n/v.  GERD: - Prior h/o GERD, currently taking Omeprazole 23m daily with good results - Denies current heartburn, night-time symptoms.  HTN: - H/o prior HTN, previously treated however discontinued due to dramatic lowering of BP, "bottoming out", and currently no anti-HTN meds  HM: - Never had colonoscopy. Interested in referral to get it done. - Denies any change in bowel habits / blood in stool  Recent Hospitalizations: - Last hospitalized for CP, ruled out MI (2011)  Social Hx: - Lives at home,  alone - remains active, painting, plumbing, mow lawns - foot pain is starting to interfere - No regular exercise - Non-smoker. Infrequent smokeless tobacco use (q 6 months) - Minimal EtOH use (couple occasions yearly) - Denies other drugs / substances  Review of Systems  See above HPI     Objective:   Physical Exam  BP 150/81  Pulse 90  Temp(Src) 98 F (36.7 C) (Oral)  Wt 149 lb (67.586 kg)  Gen - well-appearing, pleasant, cooperative, NAD HEENT - NCAT, PERRL, EOMI, patent nares w/o congestion, oropharynx clear, MMM Neck - supple, non-tender Heart - RRR, no murmurs heard Lungs - CTAB, no wheezing, crackles, or rhonchi. Normal work of breathing. Abd - soft, NTND, no masses, +active BS Ext - non-tender, no edema, peripheral pulses intact +2 b/l MSK - Left Foot: dorsum mid-foot mild TTP, significantly worse pain provoked with dorsiflexion, otherwise ROM without pain, mild mid-foot edema appreciated without ankle or other edema, no significant erythema or worse, no evidence of infection Skin - warm, dry, no rashes Neuro - awake, alert, oriented, grossly non-focal, intact muscle strength 5/5 b/l (including Left foot), intact distal sensation to light touch, gait normal      Assessment:     See specific A&P problem list for details.      Plan:     See specific A&P problem list for details.

## 2013-10-07 NOTE — Assessment & Plan Note (Signed)
Stable, controlled  Plan: 1. Continue Omeprazole 64m daily

## 2013-10-07 NOTE — Assessment & Plan Note (Signed)
Age 68, no prior h/o colonoscopy - Denies red flag symptoms or change in bowel habits  Plan: 1. Provided handout referral for pt to schedule colonoscopy with [facility]

## 2013-10-07 NOTE — Assessment & Plan Note (Signed)
Stable, continues on Triumeq - Managed by Dr. Megan Salon (ID) - Review of last labs 09/25/13: viral load < 20, CD4 330

## 2013-10-07 NOTE — Assessment & Plan Note (Signed)
Elevated BP, 150/81, h/o borderline HTN - Significant h/o reported prior hx treated with anti-HTNs not well tolerated (per chart review Lisinopril 32m daily previously).  Plan: 1. Elevated BP, but consistent with age >>23goals < 150 / 929 continue to monitor 2. Lifestyle mods 3. Re-check at next visit

## 2013-10-09 ENCOUNTER — Other Ambulatory Visit: Payer: Self-pay | Admitting: Internal Medicine

## 2013-10-09 ENCOUNTER — Ambulatory Visit (INDEPENDENT_AMBULATORY_CARE_PROVIDER_SITE_OTHER): Payer: Commercial Managed Care - HMO | Admitting: Internal Medicine

## 2013-10-09 ENCOUNTER — Encounter: Payer: Self-pay | Admitting: Internal Medicine

## 2013-10-09 ENCOUNTER — Other Ambulatory Visit: Payer: Medicare PPO

## 2013-10-09 VITALS — BP 148/89 | HR 70 | Temp 97.9°F | Ht 70.0 in | Wt 150.0 lb

## 2013-10-09 DIAGNOSIS — B20 Human immunodeficiency virus [HIV] disease: Secondary | ICD-10-CM

## 2013-10-09 DIAGNOSIS — E785 Hyperlipidemia, unspecified: Secondary | ICD-10-CM

## 2013-10-09 NOTE — Progress Notes (Signed)
Patient ID: Jeffrey [name], male   DOB: 04-21-1946, 68 y.o.   MRN: [id number]          Patient Active Problem List   Diagnosis Date Noted  . HIV DISEASE 02/27/2006    Priority: High  . Left ankle pain 08/07/2013    Priority: Medium  . Dyslipidemia 07/22/2012    Priority: Medium  . Acute renal insufficiency 07/22/2012    Priority: Medium  . Erectile dysfunction 07/22/2012    Priority: Medium  . GERD 05/17/2010    Priority: Medium  . DYSPHAGIA UNSPECIFIED 03/08/2009    Priority: Medium  . HYPERTENSION 02/27/2006    Priority: Medium  . Screening for colon cancer 10/07/2013  . CHEST PAIN 05/17/2010  . CERVICAL LYMPHADENOPATHY 09/19/2007  . CUTANEOUS ERUPTIONS, DRUG-INDUCED 05/28/2006  . CHOLECYSTECTOMY, HX OF 05/28/2006  . ESOPHAGITIS 02/27/2006  . ABSCESS, NECK 02/27/2006  . PNEUMONIA, HX OF 02/27/2006  . ARTHROSCOPY, KNEE, HX OF 02/27/2006    Patient's Medications  New Prescriptions   No medications on file  Previous Medications   ABACAVIR-DOLUTEGRAVIR-LAMIVUD 600-50-300 MG TABS    Take 1 tablet by mouth daily.   ACETAMINOPHEN (TYLENOL) 500 MG TABLET    Take 1 tablet (500 mg total) by mouth every 6 (six) hours as needed for pain.   ACETAMINOPHEN-CODEINE (TYLENOL #3) 300-30 MG PER TABLET       OMEPRAZOLE (PRILOSEC) 20 MG CAPSULE    Take 1 capsule (20 mg total) by mouth daily.   PENICILLIN V POTASSIUM (VEETID) 250 MG TABLET      Modified Medications   No medications on file  Discontinued Medications   No medications on file    Subjective: Adilson is in for his routine visit. He recently switched her to Triumeq because of his renal insufficiency. He has had no rectal problems tolerating his new medication but he has noted that his appetite is not as good as usual. He has lost about 7 pounds. He is still struggling with intermittent left ankle pain. He was seen by sports medicine and they required a referral from his memory care physician before getting an MRI. Attempts are  being made to arrange the scan now. He still has problems with acid indigestion despite taking his omeprazole.   Review of Systems: Constitutional: negative Eyes: negative Ears, nose, mouth, throat, and face: negative Respiratory: negative Cardiovascular: negative Gastrointestinal: positive for reflux symptoms, negative for abdominal pain, nausea and vomiting Genitourinary:negative  Past Medical History  Diagnosis Date  . HIV (human immunodeficiency virus infection) 1999    History  Substance Use Topics  . Smoking status: Never Smoker   . Smokeless tobacco: Current User    Types: Chew     Comment: Rare use, about q 6 months  . Alcohol Use: No    Family History  Problem Relation Age of Onset  . Hypertension Mother   . Heart attack Mother 68    Allergies  Allergen Reactions  . Efavirenz   . Nevirapine     Objective: Temp: 97.9 F (36.6 C) (06/04 1335) Temp src: Oral (06/04 1335) BP: 148/89 mmHg (06/04 1335) Pulse Rate: 70 (06/04 1335) Body mass index is 21.52 kg/(m^2).  General: His weight is down to 150 pounds  Oral: No oropharyngeal lesions  Skin: No rash  Lungs: Clear Cor:  regular S1 and S2 no murmurs  Abdomen:  soft and nontender    Lab Results Lab Results  Component Value Date   WBC 3.3* 09/25/2013   HGB 14.0 09/25/2013  HCT 41.1 09/25/2013   MCV 100.7* 09/25/2013   PLT 184 09/25/2013    Lab Results  Component Value Date   CREATININE 1.86* 09/25/2013   BUN 18 09/25/2013   NA 138 09/25/2013   K 3.8 09/25/2013   CL 106 09/25/2013   CO2 27 09/25/2013    Lab Results  Component Value Date   ALT 19 09/25/2013   AST 19 09/25/2013   ALKPHOS 94 09/25/2013   BILITOT 0.3 09/25/2013    Lab Results  Component Value Date   CHOL 173 07/09/2013   HDL 44 07/09/2013   LDLCALC 108* 07/09/2013   TRIG 106 07/09/2013   CHOLHDL 3.9 07/09/2013    Lab Results HIV 1 RNA Quant (copies/mL)  Date Value  09/25/2013 <20   06/25/2013 <20   03/27/2013 <20      CD4 T Cell Abs  (/uL)  Date Value  09/25/2013 330*  06/25/2013 400   03/27/2013 300*     Assessment: His HIV infection remains under excellent control. I doubt that his recent anorexia is due to his current antiretroviral regimen but will need to follow this closely. He is willing to continue Triumeq for now.   His renal insufficiency is slightly worse. None of his current antiretroviral medication should have any negative impact on renal function. He has borderline hypertension and this will need to be monitored closely and he may need therapy soon.  The cause of his left ankle pain is still unknown. He is managing it with acetaminophen alone.  Plan: 1. Continue Triumeq 2. Monitor anorexia and weight 3. Followup after lab work including repeat CD4 count, viral load and creatinine in 3 months    [name], MD [facility] for [place] [phone] pager   [phone] cell 10/09/2013, 1:54 PM

## 2013-10-09 NOTE — Addendum Note (Signed)
Addended by: [name] A on: 10/09/2013 02:18 PM   Modules accepted: Orders

## 2013-10-14 ENCOUNTER — Telehealth: Payer: Self-pay | Admitting: Family Medicine

## 2013-10-14 NOTE — Telephone Encounter (Signed)
Pt is calling about his referral for MRI. He wasnts to know when and where Please advise

## 2013-10-15 NOTE — Telephone Encounter (Signed)
Left message on voicemail informing patient of appointment on 6/29 at 10am at [place] for MRI.

## 2013-11-03 ENCOUNTER — Ambulatory Visit (HOSPITAL_COMMUNITY)
Admission: RE | Admit: 2013-11-03 | Discharge: 2013-11-03 | Disposition: A | Discharge status: Home / Self Care | Payer: Medicare PPO | Source: Ambulatory Visit | Attending: Family Medicine | Admitting: Family Medicine

## 2013-11-03 ENCOUNTER — Other Ambulatory Visit: Payer: Self-pay | Admitting: Family Medicine

## 2013-11-03 DIAGNOSIS — M19079 Primary osteoarthritis, unspecified ankle and foot: Secondary | ICD-10-CM | POA: Insufficient documentation

## 2013-11-03 DIAGNOSIS — M25579 Pain in unspecified ankle and joints of unspecified foot: Secondary | ICD-10-CM | POA: Diagnosis present

## 2013-11-03 DIAGNOSIS — M25572 Pain in left ankle and joints of left foot: Secondary | ICD-10-CM

## 2013-11-05 ENCOUNTER — Telehealth: Payer: Self-pay | Admitting: Family Medicine

## 2013-11-05 NOTE — Telephone Encounter (Signed)
Reviewed recent L-ankle MRI results from 11/03/13, obtained due to worsening chronic localized L-ankle pain. MRI demonstrated no evidence of osteomyelitis or occult fracture. Noted some non-specific marrow edema likely arthritis / stress induced, mild mid-foot degenerative changes, and mild attenuation of posterior tib tendon changes without tear.  Called and spoke with Carma Leaven, discussed results, answered questions, agree with plan to arrange follow-up to return to [facility] within 1-2 weeks to f/u with Dr. Micheline Chapman. In meantime, continue conservative management.  Called [facility] and scheduled f/u with Dr. Micheline Chapman for next available 11/19/13 at 3:00pm. Patient to be notified of appointment.  Nobie Putnam, [place], PGY-2

## 2013-11-12 ENCOUNTER — Other Ambulatory Visit: Payer: Self-pay | Admitting: *Deleted

## 2013-11-12 ENCOUNTER — Other Ambulatory Visit: Payer: Self-pay | Admitting: Internal Medicine

## 2013-11-12 DIAGNOSIS — B20 Human immunodeficiency virus [HIV] disease: Secondary | ICD-10-CM

## 2013-11-12 MED ORDER — OMEPRAZOLE 20 MG PO CPDR: 20.0000 mg | DELAYED_RELEASE_CAPSULE | Freq: Every day | ORAL | Status: DC

## 2013-11-19 ENCOUNTER — Ambulatory Visit (INDEPENDENT_AMBULATORY_CARE_PROVIDER_SITE_OTHER): Payer: Medicare PPO | Admitting: Sports Medicine

## 2013-11-19 ENCOUNTER — Encounter: Payer: Self-pay | Admitting: Sports Medicine

## 2013-11-19 VITALS — BP 155/87 | HR 75 | Ht 70.0 in | Wt 150.0 lb

## 2013-11-19 DIAGNOSIS — M25579 Pain in unspecified ankle and joints of unspecified foot: Secondary | ICD-10-CM

## 2013-11-19 DIAGNOSIS — M25572 Pain in left ankle and joints of left foot: Secondary | ICD-10-CM

## 2013-11-20 NOTE — Progress Notes (Signed)
Patient ID: Jeffrey [name], male   DOB: 09-26-1945, 68 y.o.   MRN: [id number]  Patient comes in today to discuss MRI findings of his left ankle. There is no evidence of tendon pathology. Only some mild attenuation of the posterior tibialis tendon distally. Dominant finding is some degenerative changes in the ankle joint as well as at the mid foot. Physical exam was not repeated today but I did discuss these findings in depth with the patient. I discussed treatment options including arch strap for compression, custom orthotics, and pain medication in the form of Ultram (patient is unable to take NSAIDS). He would like to start with the simple arch straps and go from there. He will followup with me when necessary.

## 2013-11-25 ENCOUNTER — Emergency Department (INDEPENDENT_AMBULATORY_CARE_PROVIDER_SITE_OTHER): Payer: Medicare PPO

## 2013-11-25 ENCOUNTER — Emergency Department (INDEPENDENT_AMBULATORY_CARE_PROVIDER_SITE_OTHER)
Admission: EM | Admit: 2013-11-25 | Discharge: 2013-11-25 | Disposition: A | Discharge status: Home / Self Care | Payer: Medicare PPO | Source: Home / Self Care

## 2013-11-25 DIAGNOSIS — S82843A Displaced bimalleolar fracture of unspecified lower leg, initial encounter for closed fracture: Secondary | ICD-10-CM

## 2013-11-25 DIAGNOSIS — R296 Repeated falls: Secondary | ICD-10-CM | POA: Diagnosis not present

## 2013-11-25 DIAGNOSIS — S82842A Displaced bimalleolar fracture of left lower leg, initial encounter for closed fracture: Secondary | ICD-10-CM

## 2013-11-25 MED ORDER — HYDROCODONE-ACETAMINOPHEN 5-325 MG PO TABS: 1.0000 | ORAL_TABLET | Freq: Four times a day (QID) | ORAL | Status: DC | PRN

## 2013-11-25 NOTE — Discharge Instructions (Signed)
Ice and elevate, use crutches as needed, see dr handy next week for recheck , sooner if any problems.

## 2013-11-25 NOTE — ED Provider Notes (Addendum)
CSN: [id number]     Arrival date & time 11/25/13  1027 History   None    Chief Complaint  Patient presents with  . Ankle Pain   (Consider location/radiation/quality/duration/timing/severity/associated sxs/prior Treatment) Patient is a 68 y.o. Jeffrey [name] presenting with ankle pain.  Ankle Pain Location:  Ankle Injury: yes   Mechanism of injury: fall   Fall:    Fall occurred: fell going out of porch window twisting left ankle.   Impact surface:  Hard floor   Entrapped after fall: no   Ankle location:  L ankle Pain details:    Severity:  Moderate   Onset quality:  Gradual Chronicity: chronic pain in ankle with recent MRI but this is new injury yest. Dislocation: no   Tetanus status:  Up to date Prior injury to area:  No Associated symptoms: decreased ROM and swelling   Associated symptoms: no numbness     Past Medical History  Diagnosis Date  . HIV (human immunodeficiency virus infection) 1999   Past Surgical History  Procedure Laterality Date  . Cholecystectomy  1982   Family History  Problem Relation Age of Onset  . Hypertension Mother   . Heart attack Mother 55   History  Substance Use Topics  . Smoking status: Never Smoker   . Smokeless tobacco: Current User    Types: Chew     Comment: Rare use, about q 6 months  . Alcohol Use: No    Review of Systems  Constitutional: Negative.   Musculoskeletal: Positive for gait problem and joint swelling.  Skin: Positive for wound.    Allergies  Efavirenz and Nevirapine  Home Medications   Prior to Admission medications   Medication Sig Start Date End Date Taking? Authorizing Provider  Abacavir-Dolutegravir-Lamivud [phone] MG TABS Take 1 tablet by mouth daily. 09/02/13   [name], MD  acetaminophen (TYLENOL) 500 MG tablet Take 1 tablet (500 mg total) by mouth every 6 (six) hours as needed for pain. 07/22/12   [name], MD  acetaminophen-codeine (TYLENOL #3) 300-30 MG per tablet  10/02/13   Historical Provider,  MD  HYDROcodone-acetaminophen (NORCO/VICODIN) 5-325 MG per tablet Take 1 tablet by mouth every 6 (six) hours as needed for moderate pain. 11/25/13   Billy Fischer, MD  omeprazole (PRILOSEC) 20 MG capsule Take 1 capsule (20 mg total) by mouth daily. 11/12/13   [name], MD  penicillin v potassium (VEETID) 250 MG tablet  10/02/13   Historical Provider, MD   BP 117/79  Pulse 76  Temp(Src) 98.1 F (Jeffrey.7 C) (Oral)  Resp 20  SpO2 99% Physical Exam  Nursing note and vitals reviewed. Constitutional: He is oriented to person, place, and time. He appears well-developed and well-nourished.  Musculoskeletal: He exhibits tenderness.  Abrasion to left ant tibia, marked sts and ecchymosis to medial ankle across dorsum of foot, distal nvt intact.  Neurological: He is alert and oriented to person, place, and time.  Skin: Skin is warm and dry.    ED Course  Procedures (including critical care time) Labs Review Labs Reviewed - No data to display  Imaging Review Dg Ankle Complete Left  11/25/2013   CLINICAL DATA:  [place].  Left ankle pain.  EXAM: LEFT ANKLE COMPLETE - 3+ VIEW  COMPARISON:  08/07/2013.  FINDINGS: There are nondisplaced fractures of the medial malleolus and lateral malleolus. The medial malleolus fracture is at the level of the ankle mortise and above and does involve the articular surface. The distal fibular fractures below the  level of the ankle mortise. The ankle mortise is maintained. The subtalar joints are normal.  IMPRESSION: Nondisplaced bimalleolar fractures.   Electronically Signed   By: Kalman Jewels M.D.   On: 11/25/2013 11:32   X-rays reviewed and report per radiologist.   MDM   1. Bimalleolar ankle fracture, left, closed, initial encounter        Billy Fischer, MD 11/25/13 [place], MD 11/25/13 1258

## 2013-11-25 NOTE — Progress Notes (Signed)
Orthopedic Tech Progress Note Patient Details:  Jeffrey [name] [birth year] [id number]  Ortho Devices Type of Ortho Device: Post (short leg) splint;Stirrup splint Ortho Device/Splint Interventions: Application   Cammer, Theodoro Parma 11/25/2013, 3:00 PM

## 2013-11-25 NOTE — ED Notes (Signed)
PT  FELL  YESTERDAY  AND  INJ  HIS  L  ANKLE  THE  ANKLE IS  SWOLLEN  AND     BRUISED

## 2013-12-05 ENCOUNTER — Telehealth: Payer: Self-pay | Admitting: *Deleted

## 2013-12-05 DIAGNOSIS — S82842A Displaced bimalleolar fracture of left lower leg, initial encounter for closed fracture: Secondary | ICD-10-CM

## 2013-12-05 NOTE — Telephone Encounter (Signed)
Kristy from Dr. Arlington Calix office says they have patient in office today and he was referred there from ED due to left ankle fracture. Patient needs referral to come from PCP office in order for insurance to cover. Will forward to PCP to place referral, ED notes in epic.

## 2013-12-12 ENCOUNTER — Telehealth: Payer: Self-pay | Admitting: *Deleted

## 2013-12-12 NOTE — Telephone Encounter (Signed)
Jeffrey [name] with [name] states that she patient current ortho MD ([name]) is not covered under his network but patient can be referred to [name]. [name] will only cover 1 visit to [name]. Patient informed and new appointment scheduled with [name] for 12/15/13 at 945am.

## 2014-01-13 ENCOUNTER — Other Ambulatory Visit: Payer: Medicare PPO

## 2014-01-13 DIAGNOSIS — E785 Hyperlipidemia, unspecified: Secondary | ICD-10-CM

## 2014-01-13 DIAGNOSIS — B20 Human immunodeficiency virus [HIV] disease: Secondary | ICD-10-CM

## 2014-01-13 LAB — BASIC METABOLIC PANEL
BUN: 19 mg/dL (ref 6–23)
CHLORIDE: 108 meq/L (ref 96–112)
CO2: 27 meq/L (ref 19–32)
Calcium: 9.3 mg/dL (ref 8.4–10.5)
Creat: 1.68 mg/dL — ABNORMAL HIGH (ref 0.50–1.35)
Glucose, Bld: 96 mg/dL (ref 70–99)
Potassium: 4.2 mEq/L (ref 3.5–5.3)
Sodium: 139 mEq/L (ref 135–145)

## 2014-01-14 LAB — HIV-1 RNA QUANT-NO REFLEX-BLD: HIV-1 RNA Quant, Log: <1.3 {Log} (ref <1.30)

## 2014-01-14 LAB — T-HELPER CELL (CD4) - (RCID CLINIC ONLY)
CD4 % Helper T Cell: 18 % — ABNORMAL LOW (ref 33–55)
CD4 T Cell Abs: 340 /uL — ABNORMAL LOW (ref 400–2700)

## 2014-01-27 ENCOUNTER — Encounter: Payer: Self-pay | Admitting: Internal Medicine

## 2014-01-27 ENCOUNTER — Ambulatory Visit (INDEPENDENT_AMBULATORY_CARE_PROVIDER_SITE_OTHER): Payer: Commercial Managed Care - HMO | Admitting: Internal Medicine

## 2014-01-27 VITALS — BP 147/88 | HR 68 | Temp 97.5°F | Wt 149.0 lb

## 2014-01-27 DIAGNOSIS — Z23 Encounter for immunization: Secondary | ICD-10-CM | POA: Diagnosis not present

## 2014-01-27 DIAGNOSIS — B20 Human immunodeficiency virus [HIV] disease: Secondary | ICD-10-CM

## 2014-01-27 NOTE — Progress Notes (Signed)
Patient ID: Jeffrey [name], male   DOB: 10/01/1945, 68 y.o.   MRN: [id number]          Patient Active Problem List   Diagnosis Date Noted  . HIV DISEASE 02/27/2006    Priority: High  . Left ankle pain 08/07/2013    Priority: Medium  . Dyslipidemia 07/22/2012    Priority: Medium  . Acute renal insufficiency 07/22/2012    Priority: Medium  . Erectile dysfunction 07/22/2012    Priority: Medium  . GERD 05/17/2010    Priority: Medium  . DYSPHAGIA UNSPECIFIED 03/08/2009    Priority: Medium  . HYPERTENSION 02/27/2006    Priority: Medium  . Screening for colon cancer 10/07/2013  . CHEST PAIN 05/17/2010  . CERVICAL LYMPHADENOPATHY 09/19/2007  . CUTANEOUS ERUPTIONS, DRUG-INDUCED 05/28/2006  . CHOLECYSTECTOMY, HX OF 05/28/2006  . ESOPHAGITIS 02/27/2006  . ABSCESS, NECK 02/27/2006  . PNEUMONIA, HX OF 02/27/2006  . ARTHROSCOPY, KNEE, HX OF 02/27/2006    Patient's Medications  New Prescriptions   No medications on file  Previous Medications   ABACAVIR-DOLUTEGRAVIR-LAMIVUD 600-50-300 MG TABS    Take 1 tablet by mouth daily.   ACETAMINOPHEN (TYLENOL) 500 MG TABLET    Take 1 tablet (500 mg total) by mouth every 6 (six) hours as needed for pain.   OMEPRAZOLE (PRILOSEC) 20 MG CAPSULE    Take 1 capsule (20 mg total) by mouth daily.  Modified Medications   No medications on file  Discontinued Medications   ACETAMINOPHEN-CODEINE (TYLENOL #3) 300-30 MG PER TABLET       HYDROCODONE-ACETAMINOPHEN (NORCO/VICODIN) 5-325 MG PER TABLET    Take 1 tablet by mouth every 6 (six) hours as needed for moderate pain.   PENICILLIN V POTASSIUM (VEETID) 250 MG TABLET        Subjective: Jeffrey [name] is in for his routine visit. He is had no problems obtaining or taking his Triumeq. He recalls missing only one dose since his last visit. That occurred when he fell and broke his left ankle. Is on pain medication for a few days and simply forgot to take 1 dose. He is feeling much better now and he is off pain  medications and his ankle is feeling better. Review of Systems: Pertinent items are noted in HPI.  Past Medical History  Diagnosis Date  . HIV (human immunodeficiency virus infection) 1999    History  Substance Use Topics  . Smoking status: Never Smoker   . Smokeless tobacco: Former Systems developer    Types: Chew     Comment: Rare use, about q 6 months  . Alcohol Use: No    Family History  Problem Relation Age of Onset  . Hypertension Mother   . Heart attack Mother 78    Allergies  Allergen Reactions  . Efavirenz   . Nevirapine     Objective: Temp: 97.5 F (36.4 C) (09/22 0905) Temp src: Oral (09/22 0905) BP: 147/88 mmHg (09/22 0905) Pulse Rate: 68 (09/22 0905) Body mass index is 21.38 kg/(m^2).  General: He is smiling and in good spirits Oral: No oropharyngeal lesions Skin: No rash Lungs: Clear Cor: Regular S1 and S2 with no murmur  Lab Results Lab Results  Component Value Date   WBC 3.3* 09/25/2013   HGB 14.0 09/25/2013   HCT 41.1 09/25/2013   MCV 100.7* 09/25/2013   PLT 184 09/25/2013    Lab Results  Component Value Date   CREATININE 1.68* 01/13/2014   BUN 19 01/13/2014   NA 139 01/13/2014   K  4.2 01/13/2014   CL 108 01/13/2014   CO2 27 01/13/2014    Lab Results  Component Value Date   ALT 19 09/25/2013   AST 19 09/25/2013   ALKPHOS 94 09/25/2013   BILITOT 0.3 09/25/2013    Lab Results  Component Value Date   CHOL 173 07/09/2013   HDL 44 07/09/2013   LDLCALC 108* 07/09/2013   TRIG 106 07/09/2013   CHOLHDL 3.9 07/09/2013    Lab Results HIV 1 RNA Quant (copies/mL)  Date Value  01/13/2014 <20   09/25/2013 <20   06/25/2013 <20      CD4 T Cell Abs (/uL)  Date Value  01/13/2014 340*  09/25/2013 330*  06/25/2013 400      Assessment: His HIV infection is under much better control since restarting therapy a little over one year ago. His renal function is a little better since switching to Triumeq.  Plan: 1. Continue Triumeq 2. Followup after blood work in 6 months   [name], MD [facility] for [name] [phone] pager   [phone] cell 01/27/2014, 9:47 AM

## 2014-06-17 ENCOUNTER — Encounter: Payer: Self-pay | Admitting: Family Medicine

## 2014-06-17 ENCOUNTER — Ambulatory Visit (INDEPENDENT_AMBULATORY_CARE_PROVIDER_SITE_OTHER): Payer: Medicare PPO | Admitting: Family Medicine

## 2014-06-17 VITALS — BP 149/93 | HR 65 | Temp 97.7°F | Ht 70.0 in | Wt 147.6 lb

## 2014-06-17 DIAGNOSIS — K2100 Gastro-esophageal reflux disease with esophagitis, without bleeding: Secondary | ICD-10-CM

## 2014-06-17 DIAGNOSIS — N401 Enlarged prostate with lower urinary tract symptoms: Secondary | ICD-10-CM

## 2014-06-17 DIAGNOSIS — R399 Unspecified symptoms and signs involving the genitourinary system: Secondary | ICD-10-CM | POA: Diagnosis not present

## 2014-06-17 DIAGNOSIS — I10 Essential (primary) hypertension: Secondary | ICD-10-CM | POA: Diagnosis not present

## 2014-06-17 DIAGNOSIS — N138 Other obstructive and reflux uropathy: Secondary | ICD-10-CM | POA: Insufficient documentation

## 2014-06-17 LAB — POCT URINALYSIS DIPSTICK
Bilirubin, UA: NEGATIVE
Glucose, UA: 100
KETONES UA: NEGATIVE
LEUKOCYTES UA: NEGATIVE
Nitrite, UA: NEGATIVE
Protein, UA: 100
Spec Grav, UA: >=1.03
Urobilinogen, UA: 0.2
pH, UA: 7

## 2014-06-17 LAB — POCT UA - MICROSCOPIC ONLY

## 2014-06-17 MED ORDER — TAMSULOSIN HCL 0.4 MG PO CAPS: 0.4000 mg | ORAL_CAPSULE | Freq: Every day | ORAL | Status: DC

## 2014-06-17 NOTE — Patient Instructions (Signed)
Dear Jeffrey [name], Thank you for coming in to clinic today. It was good to see you!  1. Start Flomax 0.46m daily for 1 month (given 1 refill) - take 30 min after breakfast every day. Monitor urinary symptoms. 2. Take Omeprazole 2 pills (443m daily in morning every day (30 min before breakfast) if not helping after 2 weeks, try splitting dose 20 AM and 20 PM 3. Look into PSA testing, discuss next time. Recommend digital rectal exam.  Please schedule a follow-up appointment with Dr. KaParks Rangern 1 month.  If you have any other questions or concerns, please feel free to call the clinic to contact me. You may also schedule an earlier appointment if necessary.  However, if your symptoms get significantly worse, please go to the Emergency Department to seek immediate medical attention.  [name] [facility]   Benign Prostatic Hyperplasia An enlarged prostate (benign prostatic hyperplasia) is common in older men. You may experience the following:  Weak urine stream.  Dribbling.  Feeling like the bladder has not emptied completely.  Difficulty starting urination.  Getting up frequently at night to urinate.  Urinating more frequently during the day. HOME CARE INSTRUCTIONS  Monitor your prostatic hyperplasia for any changes. The following actions may help to alleviate any discomfort you are experiencing:  Give yourself time when you urinate.  Stay away from alcohol.  Avoid beverages containing caffeine, such as coffee, tea, and colas, because they can make the problem worse.  Avoid decongestants, antihistamines, and some prescription medicines that can make the problem worse.  Follow up with your health care provider for further treatment as recommended. SEEK MEDICAL CARE IF:  You are experiencing progressive difficulty voiding.  Your urine stream is progressively getting narrower.  You are awaking from sleep with the urge to void more  frequently.  You are constantly feeling the need to void.  You experience loss of urine, especially in small amounts. SEEK IMMEDIATE MEDICAL CARE IF:   You develop increased pain with urination or are unable to urinate.  You develop severe abdominal pain, vomiting, a high fever, or fainting.  You develop back pain or blood in your urine. MAKE SURE YOU:   Understand these instructions.  Will watch your condition.  Will get help right away if you are not doing well or get worse. Document Released: 04/24/2005 Document Revised: 12/25/2012 Document Reviewed: 09/24/2012 [facility] Landingatient Information 2015 Ex[place]hor[place] Manor[place]aineThis information is not intended to replace advice given to you by your health care provider. Make sure you discuss any questions you have with your health care provider.

## 2014-06-17 NOTE — Progress Notes (Signed)
Subjective:     Patient ID: Jeffrey [name], male   DOB: 05-27-1945, 69 y.o.   MRN: [id number]  HPI   LOWER URINARY TRACT SYMPTOMS: - Reports problem over past 3-4 months, intermittent episodes of decreased urination during day (1-2x daily) and then often with increased nocturia (7-8x nightly) with some admitted sleeping difficulties. Sometimes with occasional difficulty starting / stopping stream. States urine appears normal. - Drinks 1 cup coffee daily, occasional tea, sodas - No prior hx UTI, BPH. Never followed by Urology - Denies family history of Prostate CA - Denies dysuria, hematuria, urine odor, fevers/chills, flank pain, abd pain, discharge  GERD: - Currently taking Omeprazole 55m daily (x 10 years), recently with worsening over past 2 weeks. States his diet is mostly stable without changes. Not aware of any current trigger foods. Does sleep with 2 pillows at night (normally) but head of bed not elevated. - history EGD 2011 by Dr. KDeatra Ina- identified and dilated EDJ stricture - Admits worsening night-time reflux symptoms (feel sensation in back of throat) - Denies persistent abdominal pain, nausea, vomiting, bowel changes  Elevated BP: - Previous history of HTN, no longer on treatment due to concerns with episodes of hypotension  Social Hx: - Non-smoker. Infrequent smokeless tobacco use (q 6 months) - Minimal EtOH use (couple occasions yearly)  Review of Systems  See above HPI     Objective:   Physical Exam  BP 149/93 mmHg  Pulse 65  Temp(Src) 97.7 F (36.5 C) (Oral)  Ht 5' 10"  (1.778 m)  Wt 147 lb 9.6 oz (66.951 kg)  BMI 21.18 kg/m2  Gen - well-appearing, pleasant, comfortable, NAD HEENT - oropharynx clear, MMM Heart - RRR, no murmurs heard MSK - No back tenderness over spine. No CVAT Abd - soft, without epigastric or suprapubic tenderness, NTND, no masses, +active BS Ext - non-tender, no edema, peripheral pulses intact +2 b/l Skin - warm, dry, no rashes      Assessment & Plan:     See specific A&P problem list for details.

## 2014-06-18 NOTE — Assessment & Plan Note (Addendum)
Suspect BPH in setting of gradual onset few months LUTS, without known history of BPH, prior UTI. History without concern for infection/UTI, nephrolithiasis. - No prior PSA - Voiding daily, vitals stable  Plan: 1. UA - neg nitrite, neg leuks, +small RBC, 100 protein >> previously with some trace blood on UAs. Not suggestive of infection at this time. 2. Did not order culture today 3. Discussed prostate CA screening. Patient deferred DRE today and will consider future PSA. 4. Agreed to start with trial empiric treatment Flomax 0.33m daily for 1 month to see if symptoms resolve. Recommend DRE at next f/u, consider PSA if symptoms not improving. 5. RTC 1 month

## 2014-06-18 NOTE — Assessment & Plan Note (Signed)
Elevated BP today (149/93), previously off anti-HTN due to concern for hypotension episodes  Plan: 1. Continue lifestyle mods. No meds today 2. Re-check at next f/u 1 month, discuss considering resuming low dose anti-HTN

## 2014-06-18 NOTE — Assessment & Plan Note (Signed)
Recent worsening symptoms (mostly night-time), previously controlled on Omeprazole 26m daily - Last EGD 2011 with stricture s/p dilated  Plan: 1. Increase Omeprazole to 473mdaily (x 2 in AM) 30 min before meal for 2 weeks. If no relief, can try 2056mID for 2 weeks 2. Discussed avoid triggers, can elevated head of bed, avoid caffeine, alcohol 3. RTC 1 month. Consider switch agents, however since previously well controlled, may need GI f/u to consider if repeat stricture present.

## 2014-06-23 ENCOUNTER — Telehealth: Payer: Self-pay | Admitting: Family Medicine

## 2014-06-23 NOTE — Telephone Encounter (Signed)
Reviewed chart, pt last seen by me at [facility] on 06/17/14. Prescribed Flomax 0.74m daily for suspected BPH with LUTS. Patient reports overall some significant improvement in urinary symptoms at this time after first few days, seems to be urinating more "normal", will continue to monitor frequency and nocturia to discuss at follow-up to determine if medication is effective. Additionally, will discuss DRE and PSA testing at future visit.  Regarding side effects from Flomax, patient reports feeling "weak and nauseas" initially after the medicine within about 30-45 min, sometimes can linger up to 1-2 hours after with gradual resolution. He states that he takes it around 7:30am. I discussed with him that orthostatic hypotension can be seen when starting this medication, previously history of HTN (no longer on any anti-HTN medications due to history of labile BPs with hypotension, last OV BP 149/93). He checked BP recently at 7am with reading 113/35 and HR 65, symptoms followed after taking Flomax around 7:30, later BP readings were 140-160 SBP / 90s DBP.  I advised patient that his symptoms may be due to some orthostatic hypotension especially if BP already low prior to taking it. Recommended moving dose to 11:00am to 12 noon, check BP prior to taking dose. Stay well hydrated to avoid low BP, cautious when standing up within 30 min after taking. Anticipate side-effects will gradually improve after taking medication > 2 weeks. (weakness < 10% incidence, nausea < 4% incidence).  Patient understands, plans to follow-up as scheduled.  ANobie Putnam DC[place] PGY-2

## 2014-06-23 NOTE — Telephone Encounter (Signed)
Pt has questions about the side effects of flomax

## 2014-06-25 ENCOUNTER — Telehealth: Payer: Self-pay | Admitting: Family Medicine

## 2014-06-25 NOTE — Telephone Encounter (Signed)
Returned call to pt regarding OTC for a cough.  Pt stated his blood pressure has been running can of high lately and his heart rate as been in the low 100s.  Today is heart rate was 102.  Pt denies chest pain.  He has been coughing , sneezing and had a fever a few days ago.  Pt asked if he discussed his blood pressure and heart rate with PCP.  Pt stated he thought he did.  Pt told he could try coricidin HBP or delsym.  Pt really should follow up with PCP.  Will forward to PCP for further advise. Derl Barrow, RN

## 2014-06-25 NOTE — Telephone Encounter (Signed)
Need advice about what OTC cold med can take due to bp and prostate issues.

## 2014-06-25 NOTE — Telephone Encounter (Signed)
Returned patient's call. Reviewed previous advice from [place] and told patient that I agree with the recommendations to try Coricidin HBP for cough, may try Delsym, goal for short-term usage of cough medicine would be fine. Patient stated he feels better today, symptoms with dry cough and nasal congestion. He denies any fevers. Also advised him that cold medicines would be safe to take while on Flomax for BPH. He understands and will follow-up if symptoms significantly worsen or if persistent >7-10 days.  Nobie Putnam, [place], PGY-2

## 2014-07-13 ENCOUNTER — Other Ambulatory Visit: Payer: Commercial Managed Care - HMO

## 2014-07-13 DIAGNOSIS — Z79899 Other long term (current) drug therapy: Secondary | ICD-10-CM | POA: Diagnosis not present

## 2014-07-13 DIAGNOSIS — B20 Human immunodeficiency virus [HIV] disease: Secondary | ICD-10-CM

## 2014-07-13 LAB — CBC
HEMATOCRIT: 40.3 % (ref 39.0–52.0)
HEMOGLOBIN: 13.7 g/dL (ref 13.0–17.0)
MCH: 33.8 pg (ref 26.0–34.0)
MCHC: 34 g/dL (ref 30.0–36.0)
MCV: 99.5 fL (ref 78.0–100.0)
MPV: 8.5 fL — ABNORMAL LOW (ref 8.6–12.4)
Platelets: 167 10*3/uL (ref 150–400)
RBC: 4.05 MIL/uL — AB (ref 4.22–5.81)
RDW: 13.7 % (ref 11.5–15.5)
WBC: 4.6 10*3/uL (ref 4.0–10.5)

## 2014-07-13 LAB — COMPREHENSIVE METABOLIC PANEL
ALK PHOS: 79 U/L (ref 39–117)
ALT: 18 U/L (ref 0–53)
AST: 20 U/L (ref 0–37)
Albumin: 3.8 g/dL (ref 3.5–5.2)
BUN: 21 mg/dL (ref 6–23)
CALCIUM: 8.9 mg/dL (ref 8.4–10.5)
CO2: 27 mEq/L (ref 19–32)
Chloride: 103 mEq/L (ref 96–112)
Creat: 1.56 mg/dL — ABNORMAL HIGH (ref 0.50–1.35)
GLUCOSE: 91 mg/dL (ref 70–99)
Potassium: 3.8 mEq/L (ref 3.5–5.3)
Sodium: 135 mEq/L (ref 135–145)
TOTAL PROTEIN: 6.1 g/dL (ref 6.0–8.3)
Total Bilirubin: 0.3 mg/dL (ref 0.2–1.2)

## 2014-07-13 LAB — LIPID PANEL
CHOLESTEROL: 181 mg/dL (ref 0–200)
HDL: 57 mg/dL (ref >=40)
LDL Cholesterol: 105 mg/dL — ABNORMAL HIGH (ref 0–99)
Total CHOL/HDL Ratio: 3.2 Ratio
Triglycerides: 93 mg/dL (ref <150)
VLDL: 19 mg/dL (ref 0–40)

## 2014-07-13 LAB — RPR

## 2014-07-14 LAB — T-HELPER CELL (CD4) - (RCID CLINIC ONLY)
CD4 T CELL HELPER: 22 % — AB (ref 33–55)
CD4 T Cell Abs: 400 /uL (ref 400–2700)

## 2014-07-14 LAB — HIV-1 RNA QUANT-NO REFLEX-BLD
HIV 1 RNA Quant: <20 copies/mL (ref <20)
HIV-1 RNA Quant, Log: <1.3 {Log} (ref <1.30)

## 2014-07-22 ENCOUNTER — Ambulatory Visit (INDEPENDENT_AMBULATORY_CARE_PROVIDER_SITE_OTHER): Payer: Commercial Managed Care - HMO | Admitting: Family Medicine

## 2014-07-22 ENCOUNTER — Encounter: Payer: Self-pay | Admitting: Family Medicine

## 2014-07-22 VITALS — BP 149/92 | HR 78 | Temp 98.4°F | Ht 70.0 in | Wt 148.2 lb

## 2014-07-22 DIAGNOSIS — I10 Essential (primary) hypertension: Secondary | ICD-10-CM | POA: Diagnosis not present

## 2014-07-22 DIAGNOSIS — Z1211 Encounter for screening for malignant neoplasm of colon: Secondary | ICD-10-CM | POA: Diagnosis not present

## 2014-07-22 DIAGNOSIS — N401 Enlarged prostate with lower urinary tract symptoms: Secondary | ICD-10-CM | POA: Diagnosis not present

## 2014-07-22 DIAGNOSIS — G4700 Insomnia, unspecified: Secondary | ICD-10-CM | POA: Diagnosis not present

## 2014-07-22 DIAGNOSIS — N138 Other obstructive and reflux uropathy: Secondary | ICD-10-CM

## 2014-07-22 MED ORDER — TAMSULOSIN HCL 0.4 MG PO CAPS: 0.4000 mg | ORAL_CAPSULE | Freq: Every day | ORAL | Status: DC

## 2014-07-22 NOTE — Assessment & Plan Note (Addendum)
Improved LUTS after starting Flomax, supporting prior dx BPH - DRE today with mild enlarged, firm, smooth prostate without firmness or nodules - No prior PSA  Plan: 1. Check PSA total and free - screening for prostate CA (unlikely with reassuring DRE today) 2. Continue Flomax 0.68m daily (afternoon dosing, avoid AM hypotension with labile BP) 3. Monitor symptom progression, hold off adding 2nd agent Finasteride if not needed 4. RTC PRN  *UPDATE 07/23/14 - PSA 1.28 (normal) and Free PSA 0.54 (normal). Called patient to review normal lab results, discussed that PSA is for screening, currently low risk for prostate cancer, but PSA is more useful as trend, and advised will follow-up as needed, especially if symptoms change or other red flags. Questions answered, and patient understood and will follow-up as needed.

## 2014-07-22 NOTE — Assessment & Plan Note (Signed)
Due for colonoscopy, no prior, age 69, no risk factors or family hx  Plan: 1. Counseled on colonoscopy screening, hand out given, advised pt to schedule on own

## 2014-07-22 NOTE — Assessment & Plan Note (Addendum)
Chronic insomnia, mostly with difficulty sleep onset. No prior therapy  Plan: 1. Counseled on sleep hygiene improvements 2. Reduced caffeine 3. No medications at this time. Can try OTC melatonin 4. RTC PRN, will need further evaluation, PHQ-9, consider underlying etiology

## 2014-07-22 NOTE — Assessment & Plan Note (Signed)
Continues with persistent elevated BP today (>140/90). Currently with detailed BP log at home AM/PM readings suggestive of lower AM BP. Calibrated home BP cuff seems to be slightly higher than office cuff.  Plan: 1. Continue to defer anti-HTN at this time given concern for hypotension (prior h/o symptomatic orthostatic hypotension), seems at risk in AM 2. Monitor BP at home, especially if symptomatic 3. Continue flomax PM dosing 4. Re-check BP at next visit 6 months or sooner PRN

## 2014-07-22 NOTE — Progress Notes (Signed)
   Subjective:    Patient ID: Jeffrey [name], male    DOB: [birth year], 69 y.o.   MRN: [id number]  HPI  LUTS / ENLARGED PROSTATE FOLLOW-UP: - Last visit for same complaint 2/10. Started Flomax 0.76m daily at that time. See recent telephone note, decision to switch to later day dosing due to side effects.  - Today reports overall doing better on Flomax. Voiding much improved, now without difficulty during urinary stream (resolved starting / stopping). Daily void about 7-8x daily, and 2-3x nocturia, overall improved from limited daily void with excessive nocturia - Requests DRE and PSA today. No family hx prostate CA. - Decreased caffeine to half cup regular coffee daily - Denies any fevers/chills, dysuria, hematuria, abdominal or flank pain, urinary odor  ELEVATED BP / LABILE BP: - Chronic h/o labile BPs. Previously dx chronic HTN, previously on anti-HTN meds however discontinued over past few years due to labile BPs with concerns of hypotension. [facility] >140/90, consider resuming low dose anti-HTN - See telephone note recently 06/2014 with concerns of low BPs in AM and some symptoms of weakness and nausea after starting Flomax - Today reports overall doing better since switching Flomax to 1200 noon dose. Still mild general weakness in morning, but feels better once gets up for the day. - Has detailed BP log AM/PM since last visit. AM values significantly lower SBP 90 to 115, and PM values much higher SBP 130 to 150s - Denies any lightheadedness, dizziness, pre-syncope, CP, SOB, falls  INSOMNIA: - Chronic problem for past >20 years. Admits to hard time falling asleep. Go to bed around 11:30 to 12:00. No medications previously tried, no OTC, no herbals. Does have TV in bedroom. - Reduced caffeine - Denies significant mood disorder  HM: - Due for colonoscopy screening (no prior history colonoscopy, age 69 no red flags or family hx colon CA)  I have reviewed and  updated the following as appropriate: allergies and current medications  Social Hx: - Non-smoker. Infrequent smokeless tobacco use (q 6 months)  Review of Systems  See above HPI    Objective:   Physical Exam  BP 149/92 mmHg  Pulse 78  Temp(Src) 98.4 F (36.9 C) (Oral)  Ht 5' 10"  (1.778 m)  Wt 148 lb 3.2 oz (67.223 kg)  BMI 21.26 kg/m2  Gen - well-appearing, pleasant, NAD HEENT - MMM Neck - supple Heart - RRR, no murmurs heard Abd - soft, NTND, no masses, +active BS Ext - non-tender, no edema, peripheral pulses intact +2 b/l MSK - Back non-tender. No CVAT Skin - warm, dry, no rashes Rectal: Normal rectum without anal fissures or external hemorrhoids. Mildly enlarged, smooth, firm prostate without masses or irregularities. Normal stool without gross blood on glove.     Assessment & Plan:   See specific A&P problem list for details.

## 2014-07-22 NOTE — Patient Instructions (Signed)
Thank you for coming in to clinic today.  1. It sounds like your Enlarged Prostate (BPH) symptoms have improved since starting Flomax. 2. Continue Flomax - refill sent in to pharmacy 3. DRE (rectal exam) today with mildly enlarged prostate, smooth without concerns for cancer. 4. Check PSA blood test today - will call with results 5. Recommend Colonoscopy screening - call number provided to schedule. May call our clinic with any questions.  No BP meds today. Continue current plan. Only check as needed.  Sleep Hygiene tip sheet for more info.  Please schedule a follow-up appointment with Dr. Parks Ranger in 6 months for BP  If you have any other questions or concerns, please feel free to call the clinic to contact me. You may also schedule an earlier appointment if necessary.  However, if your symptoms get significantly worse, please go to the Emergency Department to seek immediate medical attention.  Nobie Putnam, [place]

## 2014-07-23 LAB — PSA, TOTAL AND FREE
PSA, Free Pct: 42 % (ref >25)
PSA, Free: 0.54 ng/mL
PSA: 1.28 ng/mL (ref <=4.00)

## 2014-07-28 ENCOUNTER — Encounter: Payer: Self-pay | Admitting: Internal Medicine

## 2014-07-28 ENCOUNTER — Ambulatory Visit (INDEPENDENT_AMBULATORY_CARE_PROVIDER_SITE_OTHER): Payer: Commercial Managed Care - HMO | Admitting: Internal Medicine

## 2014-07-28 DIAGNOSIS — B20 Human immunodeficiency virus [HIV] disease: Secondary | ICD-10-CM | POA: Diagnosis not present

## 2014-07-28 NOTE — Progress Notes (Signed)
Patient ID: Jeffrey [name], male   DOB: [birth year], 69 y.o.   MRN: [id number]          Patient Active Problem List   Diagnosis Date Noted  . Human immunodeficiency virus (HIV) disease 02/27/2006    Priority: High  . Left ankle pain 08/07/2013    Priority: Medium  . Dyslipidemia 07/22/2012    Priority: Medium  . Acute renal insufficiency 07/22/2012    Priority: Medium  . Erectile dysfunction 07/22/2012    Priority: Medium  . GERD 05/17/2010    Priority: Medium  . DYSPHAGIA UNSPECIFIED 03/08/2009    Priority: Medium  . Essential hypertension 02/27/2006    Priority: Medium  . Insomnia 07/22/2014  . BPH with obstruction/lower urinary tract symptoms 06/17/2014  . Screening for colon cancer 10/07/2013  . CHEST PAIN 05/17/2010  . CERVICAL LYMPHADENOPATHY 09/19/2007  . CUTANEOUS ERUPTIONS, DRUG-INDUCED 05/28/2006  . CHOLECYSTECTOMY, HX OF 05/28/2006  . ESOPHAGITIS 02/27/2006  . ABSCESS, NECK 02/27/2006  . PNEUMONIA, HX OF 02/27/2006  . ARTHROSCOPY, KNEE, HX OF 02/27/2006    Patient's Medications  New Prescriptions   No medications on file  Previous Medications   ABACAVIR-DOLUTEGRAVIR-LAMIVUD 600-50-300 MG TABS    Take 1 tablet by mouth daily.   ACETAMINOPHEN (TYLENOL) 500 MG TABLET    Take 1 tablet (500 mg total) by mouth every 6 (six) hours as needed for pain.   OMEPRAZOLE (PRILOSEC) 20 MG CAPSULE    Take 1 capsule (20 mg total) by mouth daily.   TAMSULOSIN (FLOMAX) 0.4 MG CAPS CAPSULE    Take 1 capsule (0.4 mg total) by mouth daily.  Modified Medications   No medications on file  Discontinued Medications   No medications on file    Subjective: Jeffrey [name] is in for his routine visit. He has not missed any doses of his Triumeq since his last visit. He did have transient gastroenteritis several months ago and had some nausea and vomiting. He thinks he may have vomited up his medication that one day. He started on Flomax one month ago and is having less problems urinating. He  has begun to start having some difficulty swallowing solids again but it has not started to affect his ability to eat or take his medications.  Review of Systems: Pertinent items are noted in HPI.  Past Medical History  Diagnosis Date  . HIV (human immunodeficiency virus infection) 1999    History  Substance Use Topics  . Smoking status: Never Smoker   . Smokeless tobacco: Former Systems developer    Types: Chew     Comment: Rare use, about q 6 months  . Alcohol Use: No    Family History  Problem Relation Age of Onset  . Hypertension Mother   . Heart attack Mother 26    Allergies  Allergen Reactions  . Efavirenz   . Nevirapine     Objective: Temp: 97.9 F (36.6 C) (03/22 0940) Temp Source: Oral (03/22 0940) BP: 143/92 mmHg (03/22 0940) Pulse Rate: 80 (03/22 0940) Body mass index is 21.16 kg/(m^2).  General: He is in good spirits as usual Skin: No rash Lungs: Clear Cor: Regular S1 and S2 with no murmurs  Lab Results Lab Results  Component Value Date   WBC 4.6 07/13/2014   HGB 13.7 07/13/2014   HCT 40.3 07/13/2014   MCV 99.5 07/13/2014   PLT 167 07/13/2014    Lab Results  Component Value Date   CREATININE 1.56* 07/13/2014   BUN 21 07/13/2014   NA  135 07/13/2014   K 3.8 07/13/2014   CL 103 07/13/2014   CO2 27 07/13/2014    Lab Results  Component Value Date   ALT 18 07/13/2014   AST 20 07/13/2014   ALKPHOS 79 07/13/2014   BILITOT 0.3 07/13/2014    Lab Results  Component Value Date   CHOL 181 07/13/2014   HDL 57 07/13/2014   LDLCALC 105* 07/13/2014   TRIG 93 07/13/2014   CHOLHDL 3.2 07/13/2014    Lab Results HIV 1 RNA QUANT (copies/mL)  Date Value  07/13/2014 <20  01/13/2014 <20  09/25/2013 <20   CD4 T CELL ABS (/uL)  Date Value  07/13/2014 400  01/13/2014 340*  09/25/2013 330*     Assessment: His HIV infection remains under excellent control.  Plan: 1. Continue Triumeq 2. Follow-up after lab work in [place] months   [name],  MD [facility] for [place] [phone] pager   [phone] cell 07/28/2014, 9:58 AM

## 2014-09-29 ENCOUNTER — Telehealth: Payer: Self-pay | Admitting: Family Medicine

## 2014-09-29 NOTE — Telephone Encounter (Signed)
Pt called and would like to speak to Dr. Parks Ranger about his bladder issue. The medication is not working now and he is concerned. He did make an appointment to see you but it is not until 10/15/14. Please call to discuss. jw

## 2014-09-30 ENCOUNTER — Encounter (HOSPITAL_COMMUNITY): Payer: Self-pay | Admitting: Emergency Medicine

## 2014-09-30 ENCOUNTER — Emergency Department (HOSPITAL_COMMUNITY)
Admission: EM | Admit: 2014-09-30 | Discharge: 2014-10-01 | Disposition: A | Discharge status: Home / Self Care | Payer: Commercial Managed Care - HMO | Attending: Emergency Medicine | Admitting: Emergency Medicine

## 2014-09-30 ENCOUNTER — Emergency Department (HOSPITAL_COMMUNITY): Payer: Commercial Managed Care - HMO

## 2014-09-30 ENCOUNTER — Encounter: Payer: Self-pay | Admitting: Family Medicine

## 2014-09-30 ENCOUNTER — Ambulatory Visit (INDEPENDENT_AMBULATORY_CARE_PROVIDER_SITE_OTHER): Payer: Commercial Managed Care - HMO | Admitting: Family Medicine

## 2014-09-30 VITALS — BP 115/80 | HR 98 | Temp 98.3°F | Wt 143.0 lb

## 2014-09-30 DIAGNOSIS — B20 Human immunodeficiency virus [HIV] disease: Secondary | ICD-10-CM | POA: Diagnosis not present

## 2014-09-30 DIAGNOSIS — N419 Inflammatory disease of prostate, unspecified: Secondary | ICD-10-CM | POA: Insufficient documentation

## 2014-09-30 DIAGNOSIS — R339 Retention of urine, unspecified: Secondary | ICD-10-CM | POA: Diagnosis not present

## 2014-09-30 DIAGNOSIS — N401 Enlarged prostate with lower urinary tract symptoms: Secondary | ICD-10-CM

## 2014-09-30 DIAGNOSIS — R399 Unspecified symptoms and signs involving the genitourinary system: Secondary | ICD-10-CM | POA: Diagnosis not present

## 2014-09-30 DIAGNOSIS — R7989 Other specified abnormal findings of blood chemistry: Secondary | ICD-10-CM | POA: Diagnosis not present

## 2014-09-30 DIAGNOSIS — R0609 Other forms of dyspnea: Secondary | ICD-10-CM

## 2014-09-30 DIAGNOSIS — Z79899 Other long term (current) drug therapy: Secondary | ICD-10-CM | POA: Insufficient documentation

## 2014-09-30 DIAGNOSIS — N138 Other obstructive and reflux uropathy: Secondary | ICD-10-CM

## 2014-09-30 LAB — CBC WITH DIFFERENTIAL/PLATELET
BASOS ABS: 0 10*3/uL (ref 0.0–0.1)
BASOS PCT: 0 % (ref 0–1)
Eosinophils Absolute: 0 10*3/uL (ref 0.0–0.7)
Eosinophils Relative: 1 % (ref 0–5)
HCT: 40.7 % (ref 39.0–52.0)
HEMOGLOBIN: 13.9 g/dL (ref 13.0–17.0)
LYMPHS PCT: 32 % (ref 12–46)
Lymphs Abs: 1.6 10*3/uL (ref 0.7–4.0)
MCH: 33.4 pg (ref 26.0–34.0)
MCHC: 34.2 g/dL (ref 30.0–36.0)
MCV: 97.8 fL (ref 78.0–100.0)
MONOS PCT: 8 % (ref 3–12)
Monocytes Absolute: 0.4 10*3/uL (ref 0.1–1.0)
Neutro Abs: 2.9 10*3/uL (ref 1.7–7.7)
Neutrophils Relative %: 59 % (ref 43–77)
PLATELETS: 176 10*3/uL (ref 150–400)
RBC: 4.16 MIL/uL — ABNORMAL LOW (ref 4.22–5.81)
RDW: 13.1 % (ref 11.5–15.5)
WBC: 4.9 10*3/uL (ref 4.0–10.5)

## 2014-09-30 LAB — COMPREHENSIVE METABOLIC PANEL
ALT: 18 U/L (ref 17–63)
AST: 23 U/L (ref 15–41)
Albumin: 3.8 g/dL (ref 3.5–5.0)
Alkaline Phosphatase: 71 U/L (ref 38–126)
Anion gap: 8 (ref 5–15)
BILIRUBIN TOTAL: 0.6 mg/dL (ref 0.3–1.2)
BUN: 20 mg/dL (ref 6–20)
CO2: 25 mmol/L (ref 22–32)
Calcium: 9.5 mg/dL (ref 8.9–10.3)
Chloride: 107 mmol/L (ref 101–111)
Creatinine, Ser: 1.94 mg/dL — ABNORMAL HIGH (ref 0.61–1.24)
GFR calc Af Amer: 40 mL/min — ABNORMAL LOW (ref >60)
GFR calc non Af Amer: 34 mL/min — ABNORMAL LOW (ref >60)
Glucose, Bld: 93 mg/dL (ref 65–99)
POTASSIUM: 3.5 mmol/L (ref 3.5–5.1)
Sodium: 140 mmol/L (ref 135–145)
Total Protein: 6.7 g/dL (ref 6.5–8.1)

## 2014-09-30 LAB — URINALYSIS, ROUTINE W REFLEX MICROSCOPIC
BILIRUBIN URINE: NEGATIVE
Glucose, UA: 250 mg/dL — AB
Ketones, ur: NEGATIVE mg/dL
LEUKOCYTES UA: NEGATIVE
Nitrite: NEGATIVE
PH: 6 (ref 5.0–8.0)
PROTEIN: 100 mg/dL — AB
Specific Gravity, Urine: 1.025 (ref 1.005–1.030)
UROBILINOGEN UA: 0.2 mg/dL (ref 0.0–1.0)

## 2014-09-30 LAB — POCT URINALYSIS DIPSTICK
Bilirubin, UA: NEGATIVE
Glucose, UA: 100
Ketones, UA: NEGATIVE
Leukocytes, UA: NEGATIVE
NITRITE UA: NEGATIVE
PH UA: 5.5
Protein, UA: 100
Urobilinogen, UA: 0.2

## 2014-09-30 LAB — URINE MICROSCOPIC-ADD ON

## 2014-09-30 LAB — POCT UA - MICROSCOPIC ONLY

## 2014-09-30 MED ORDER — CIPROFLOXACIN HCL 500 MG PO TABS: 500.0000 mg | ORAL_TABLET | Freq: Two times a day (BID) | ORAL | Status: DC

## 2014-09-30 NOTE — Assessment & Plan Note (Signed)
Patient with tenderness of prostate on exam and with associated back pain and flow issues bringing in to question whether or not he is retaining urine at this time. He is afebrile and urine does not appear to be infected, though there is protein in the urine bringing in to question a renal issue. Concern with his BPH is for hydronephrosis due to obstruction. Given this concern the patient will be sent to the ED for [place] to further evaluate this and possible urinary catheter placement. Will treat for possible prostatitis with ciprofloxacin. He will follow-up with his PCP in one week. Would consider referral to urology if not necessary in the ED. Discussed with Dr Gwendlyn Deutscher.

## 2014-09-30 NOTE — ED Provider Notes (Signed)
CSN: [id number]     Arrival date & time 09/30/14  1657 History   First MD Initiated Contact with Patient 09/30/14 2156     Chief Complaint  Patient presents with  . Urinary Retention     (Consider location/radiation/quality/duration/timing/severity/associated sxs/prior Treatment) HPI Comments: Patient is a 69 year old male with a past medical history of HIV (last CD4 count 400) who presents to the emergency department for further evaluation of frequent urination and the sensation of incomplete bladder emptying. He was seen by his PCP on 06/17/14 for this initially with follow-up on 07/22/14. Patient with hx of normal PSA; symptoms presumed to be mild BPH. He was started on a trial of Flomax which alleviated his symptoms quite well until 1 week ago. He reports some complaints of b/l flank pain/low back pain, mostly worsening with an urge to void. Patient has had some intermittent hematuria. He denies any of this recently. He further denies fever, nausea, vomiting, diarrhea, melena, hematochezia, penile discharge, and abdominal pain. He was seen at his PCP office today and found to have enlarged prostate with TTP. He was sent to the ED from his PCP office for renal US over concern that his enlarged prostate is leading to hydronephrosis. He was started on a course of ciprofloxacin as well to cover for prostatitis.  The history is provided by the patient. No language interpreter was used.    Past Medical History  Diagnosis Date  . HIV (human immunodeficiency virus infection) 1999   Past Surgical History  Procedure Laterality Date  . Cholecystectomy  1982   Family History  Problem Relation Age of Onset  . Hypertension Mother   . Heart attack Mother 56   History  Substance Use Topics  . Smoking status: Never Smoker   . Smokeless tobacco: Former Systems developer    Types: Chew     Comment: Rare use, about q 6 months  . Alcohol Use: No    Review of Systems  Constitutional: Negative for fever.   Gastrointestinal: Negative for nausea, vomiting and abdominal pain.  Genitourinary: Positive for urgency, frequency, hematuria, decreased urine volume and difficulty urinating. Negative for dysuria, discharge and penile swelling.  Musculoskeletal: Positive for back pain.  All other systems reviewed and are negative.   Allergies  Efavirenz and Nevirapine  Home Medications   Prior to Admission medications   Medication Sig Start Date End Date Taking? Authorizing Provider  Abacavir-Dolutegravir-Lamivud 637-85-885 MG TABS Take 1 tablet by mouth daily. 09/02/13   [name], MD  acetaminophen (TYLENOL) 500 MG tablet Take 1 tablet (500 mg total) by mouth every 6 (six) hours as needed for pain. 07/22/12   [name], MD  ciprofloxacin (CIPRO) 500 MG tablet Take 1 tablet (500 mg total) by mouth 2 (two) times daily. 09/30/14   Leone Haven, MD  omeprazole (PRILOSEC) 20 MG capsule Take 1 capsule (20 mg total) by mouth daily. Patient taking differently: Take 40 mg by mouth daily.  11/12/13   [name], MD  tamsulosin (FLOMAX) 0.4 MG CAPS capsule Take 1 capsule (0.4 mg total) by mouth daily. 07/22/14   Alexander J Karamalegos, DO   BP 141/87 mmHg  Pulse 71  Temp(Src) 97.5 F (36.4 C) (Oral)  Resp 20  SpO2 97%   Physical Exam  Constitutional: He is oriented to person, place, and time. He appears well-developed and well-nourished. No distress.  Nontoxic/nonseptic appearing. Patient pleasant.  HENT:  Head: Normocephalic and atraumatic.  Eyes: Conjunctivae and EOM are normal. No scleral icterus.  Neck: Normal range of motion.  No JVD noted  Cardiovascular: Normal rate, regular rhythm and intact distal pulses.   Pulmonary/Chest: Effort normal. No respiratory distress. He has no wheezes.  Respirations even and unlabored  Abdominal: Soft. He exhibits no distension. There is no tenderness. There is no rebound and no guarding.  Soft abdomen. No masses or peritoneal signs. No areas of  focal tenderness. No guarding.  Musculoskeletal: Normal range of motion.  Neurological: He is alert and oriented to person, place, and time. He exhibits normal muscle tone. Coordination normal.  Skin: Skin is warm and dry. No rash noted. He is not diaphoretic. No erythema. No pallor.  Psychiatric: He has a normal mood and affect. His behavior is normal.  Nursing note and vitals reviewed.   ED Course  Procedures (including critical care time) Labs Review Labs Reviewed  CBC WITH DIFFERENTIAL/PLATELET - Abnormal; Notable for the following:    RBC 4.16 (*)    All other components within normal limits  COMPREHENSIVE METABOLIC PANEL - Abnormal; Notable for the following:    Creatinine, Ser 1.94 (*)    GFR calc non Af Amer 34 (*)    GFR calc Af Amer 40 (*)    All other components within normal limits  URINALYSIS, ROUTINE W REFLEX MICROSCOPIC (NOT AT [facility]) - Abnormal; Notable for the following:    Glucose, UA 250 (*)    Hgb urine dipstick TRACE (*)    Protein, ur 100 (*)    All other components within normal limits  URINE MICROSCOPIC-ADD ON - Abnormal; Notable for the following:    Casts HYALINE CASTS (*)    All other components within normal limits  URINE CULTURE    Imaging Review US Renal  09/30/2014   CLINICAL DATA:  Urinary retention  EXAM: RENAL / URINARY TRACT ULTRASOUND COMPLETE  COMPARISON:  None.  FINDINGS: Right Kidney:  Length: 10 cm. Prominent cortical echogenicity. No hydronephrosis. No evidence of mass lesion.  Left Kidney:  Length: 10 cm. Prominent cortical echogenicity. No hydronephrosis or evidence of mass lesion.  Bladder:  140 cc prevoid volume. Bilateral ureteral jets are noted. No concerning thickening or endoluminal debris.  IMPRESSION: 1. 140 cc bladder volume.  No hydronephrosis. 2. Medical renal disease.   Electronically Signed   By: Monte Fantasia M.D.   On: 09/30/2014 23:18     EKG Interpretation None      MDM   Final diagnoses:  Prostatitis,  unspecified prostatitis type  Elevated serum creatinine    69 year old male presents to the emergency department per request of his primary care doctor which she saw today. Primary care concerned for hydronephrosis secondary to prostatitis/BPH. Patient is afebrile and nontoxic/nonseptic appearing. He has a nontender and soft abdomen. Rectal exam deferred as this was completed in his primary office today and notable for enlarged prostate with tenderness to palpation. Bladder scan performed which shows 100-200cc urine. Bladder scan notable for 140cc bladder volume and medical renal disease. No hydronephrosis noted. No indication for Foley catheter placement.  Given reassuring ultrasound and laboratory workup, patient referred to his primary care doctor for recheck in one week. He has been instructed to continue course of ciprofloxacin prescribed by his primary doctor today for prostatitis. Urology referral given for additional outpatient follow-up. Return precautions discussed and provided. Patient agreeable to plan with no unaddressed concerns. Patient discharged in good condition.   Filed Vitals:   09/30/14 1703 09/30/14 2200 09/30/14 2215 09/30/14 2230  BP: 131/100 150/84 133/93 141/87  Pulse: 91 72 72 71  Temp: 97.5 F (36.4 C)     TempSrc: Oral     Resp: 20     SpO2: 97% 99% 98% 97%     Antonietta Breach, PA-C 09/30/14 [place], MD 10/01/14 [phone]

## 2014-09-30 NOTE — ED Notes (Signed)
Pt sent from [facility] for urinary retention more lately and pain in right arm; pt sts sent from further eval

## 2014-09-30 NOTE — ED Notes (Signed)
Pt from home for eval of intermittent episodes of not being able to urinate x3 months,  "I feel like I have to urinate but I cant or when I do urinate I dont get it all out." Pt reports lower abd pain, reports that it aches when palpated. Denies any hematuria, n/v/d or fevers at this time.

## 2014-09-30 NOTE — Progress Notes (Signed)
Patient ID: Jeffrey [name], male   DOB: 02/16/1946, 69 y.o.   MRN: [id number]  Tommi Rumps, MD Phone: [phone]  Jeffrey [name] is a 69 y.o. male who presents today for same day appointment.  Patient notes change in urine pattern over the past week. Notes increased frequency. Is not emptying bladder as effectively as previously. Will urinate a small amount and then go back to the bathroom 30-40 minutes later to urinate a small amount. Has some urgency. No dysuria. Using bathroom 2-3x/day and 2-3x/night. No abdominal pain, though notes bilateral flank pain over this time period.  Also notes exertional dyspnea and chest discomfort both of which improve with rest. Notes this has been increasing recently. No CP or SOB at this time. Did have an episode of right sided mid axillary pain last week that radiated down his right arm and lasted for 3-4 days. Notes some PND as well. No orthopnea. No dyspnea or diaphoresis with this. Notes he has not had this type of symptoms before.   PMH: nonsmoker.   ROS: Per HPI   Physical Exam Filed Vitals:   09/30/14 1553  BP: 115/80  Pulse: 98  Temp: 98.3 F (36.8 C)    Gen: Well NAD HEENT: PERRL,  MMM Lungs: CTABL Nl WOB Heart: RRR, no murmur appreciated Abd: soft, NT, ND Bilateral CVA tenderness Rectal with slightly enlarged prostate, smooth feeling, there is no bogginess to the prostate, though there is mild tenderness to palpation Exts: Non edematous BL  LE, warm and well perfused.    Assessment/Plan: Please see individual problem list.  Tommi Rumps, MD [place] PGY-3

## 2014-09-30 NOTE — Discharge Instructions (Signed)
Take Ciprofloxacin as prescribed by your primary care doctor. Follow up with urology as soon as you are able. Follow up with your primary doctor for a recheck of symptoms in 1 week.  Prostatitis The prostate gland is about the size and shape of a walnut. It is located just below your bladder. It produces one of the components of semen, which is made up of sperm and the fluids that help nourish and transport it out from the testicles. Prostatitis is inflammation of the prostate gland.  There are four types of prostatitis:  Acute bacterial prostatitis. This is the least common type of prostatitis. It starts quickly and usually is associated with a bladder infection, high fever, and shaking chills. It can occur at any age.  Chronic bacterial prostatitis. This is a persistent bacterial infection in the prostate. It usually develops from repeated acute bacterial prostatitis or acute bacterial prostatitis that was not properly treated. It can occur in men of any age but is most common in middle-aged men whose prostate has begun to enlarge. The symptoms are not as severe as those in acute bacterial prostatitis. Discomfort in the part of your body that is in front of your rectum and below your scrotum (perineum), lower abdomen, or in the head of your penis (glans) may represent your primary discomfort.  Chronic prostatitis (nonbacterial). This is the most common type of prostatitis. It is inflammation of the prostate gland that is not caused by a bacterial infection. The cause is unknown and may be associated with a viral infection or autoimmune disorder.  Prostatodynia (pelvic floor disorder). This is associated with increased muscular tone in the pelvis surrounding the prostate. CAUSES The causes of bacterial prostatitis are bacterial infection. The causes of the other types of prostatitis are unknown.  SYMPTOMS  Symptoms can vary depending upon the type of prostatitis that exists. There can also be  overlap in symptoms. Possible symptoms for each type of prostatitis are listed below. Acute Bacterial Prostatitis  Painful urination.  Fever or chills.  Muscle or joint pains.  Low back pain.  Low abdominal pain.  Inability to empty bladder completely. Chronic Bacterial Prostatitis, Chronic Nonbacterial Prostatitis, and Prostatodynia  Sudden urge to urinate.  Frequent urination.  Difficulty starting urine stream.  Weak urine stream.  Discharge from the urethra.  Dribbling after urination.  Rectal pain.  Pain in the testicles, penis, or tip of the penis.  Pain in the perineum.  Problems with sexual function.  Painful ejaculation.  Bloody semen. DIAGNOSIS  In order to diagnose prostatitis, your health care provider will ask about your symptoms. One or more urine samples will be taken and tested (urinalysis). If the urinalysis result is negative for bacteria, your health care provider may use a finger to feel your prostate (digital rectal exam). This exam helps your health care provider determine if your prostate is swollen and tender. It will also produce a specimen of semen that can be analyzed. TREATMENT  Treatment for prostatitis depends on the cause. If a bacterial infection is the cause, it can be treated with antibiotic medicine. In cases of chronic bacterial prostatitis, the use of antibiotics for up to 1 month or 6 weeks may be necessary. Your health care provider may instruct you to take sitz baths to help relieve pain. A sitz bath is a bath of hot water in which your hips and buttocks are under water. This relaxes the pelvic floor muscles and often helps to relieve the pressure on your prostate.  HOME CARE INSTRUCTIONS   Take all medicines as directed by your health care provider.  Take sitz baths as directed by your health care provider. SEEK MEDICAL CARE IF:   Your symptoms get worse, not better.  You have a fever. SEEK IMMEDIATE MEDICAL CARE IF:   You  have chills.  You feel nauseous or vomit.  You feel lightheaded or faint.  You are unable to urinate.  You have blood or blood clots in your urine. MAKE SURE YOU:  Understand these instructions.  Will watch your condition.  Will get help right away if you are not doing well or get worse. Document Released: 04/21/2000 Document Revised: 04/29/2013 Document Reviewed: 11/11/2012 [facility] Patient Information 2015 [place], [place]. This information is not intended to replace advice given to you by your health care provider. Make sure you discuss any questions you have with your health care provider.

## 2014-09-30 NOTE — Telephone Encounter (Signed)
Reviewed patient's chart. I have seen him recently for same complaints initially on 06/17/14 and follow-up on 07/22/14. He had LUTS, presumably due to mild BPH, normal PSA, clean UA, started on Flomax 0.4m daily initially with good relief after 1 month. Now with worsening LUTS and difficulty urinating. Called patient and spoke with [name] he reported that symptoms resumed about 1 week ago, with feeling of needing to void but difficulty voiding and poor emptying with only small amount voiding q 30 min, often waking up at night with nocturia urge. - Admits some LBP worse with urge to pee (not every time), some swelling in hands and feet worse at night intermittently - Denies any dysuria, hematuria, fevers/chills, abd pain, nausea / vomiting, dark urine  I advised him that first we would need to repeat a UA to rule out UTI, and then could do bladder scan for PVR to make sure he is emptying appropriately. If no infection and normal emptying (PVR < 150cc), then perhaps he is not responding well to Flomax, and would do trial of either Flomax 0.875mdaily vs trial switching Flomax to Rapaflo 38m36maily for 1 month. Alternatively, if he has more urinary retention >150cc, would recommend starting Finasteride daily and referral to Urology, as it would take several weeks to months to take effect.  He understood this plan, and was eager to get UA test to rule out infection before his upcoming apt on 10/14/13. [facility]. He is to keep his upcoming apt with me on 10/15/14 to discuss overall plan going forward. Additionally[facility], and they will contact [place] to see if one would be available for me to use in future.  AleNobie PutnamO Bethel SpringsGY-2

## 2014-09-30 NOTE — ED Notes (Signed)
Bladder scan done, 100-200 cc urine noted. Pt states last voided 2 hours ago in waiting room.

## 2014-09-30 NOTE — Assessment & Plan Note (Signed)
Patient with progressive worsening of exertional dyspnea. No active dyspnea or CP. EKG today NSR rate 89, no ischemic changes noted. Concern is for cardiac process causing this symptoms with exertional CP. Will obtain echo. Will refer to cardiology for further evaluation. Given return precautions.

## 2014-09-30 NOTE — Patient Instructions (Signed)
Nice to meet you. I am concerned that you may have urine backing up in to your kidneys leading to your back pain. You need to have an US done to ensure that this is or isn't happening. We would like for you to go to the ED for further evaluation of this issue.  We will treat you for prostatitis with cipro floxacin. I would like for you to return to clinic to see you PCP in 1 week for follow-up.

## 2014-09-30 NOTE — ED Notes (Signed)
Patient transported to Ultrasound 

## 2014-10-02 LAB — URINE CULTURE: Colony Count: 1000

## 2014-10-07 ENCOUNTER — Other Ambulatory Visit: Payer: Self-pay | Admitting: *Deleted

## 2014-10-07 DIAGNOSIS — B20 Human immunodeficiency virus [HIV] disease: Secondary | ICD-10-CM

## 2014-10-07 MED ORDER — ABACAVIR-DOLUTEGRAVIR-LAMIVUD 600-50-300 MG PO TABS: 1.0000 | ORAL_TABLET | Freq: Every day | ORAL | Status: DC

## 2014-10-15 ENCOUNTER — Ambulatory Visit (INDEPENDENT_AMBULATORY_CARE_PROVIDER_SITE_OTHER): Payer: Commercial Managed Care - HMO | Admitting: Family Medicine

## 2014-10-15 ENCOUNTER — Encounter: Payer: Self-pay | Admitting: Family Medicine

## 2014-10-15 VITALS — Ht 70.0 in | Wt 141.0 lb

## 2014-10-15 DIAGNOSIS — R399 Unspecified symptoms and signs involving the genitourinary system: Secondary | ICD-10-CM

## 2014-10-15 DIAGNOSIS — N410 Acute prostatitis: Secondary | ICD-10-CM

## 2014-10-15 DIAGNOSIS — N138 Other obstructive and reflux uropathy: Secondary | ICD-10-CM

## 2014-10-15 DIAGNOSIS — M62830 Muscle spasm of back: Secondary | ICD-10-CM | POA: Diagnosis not present

## 2014-10-15 DIAGNOSIS — N419 Inflammatory disease of prostate, unspecified: Secondary | ICD-10-CM | POA: Insufficient documentation

## 2014-10-15 DIAGNOSIS — N179 Acute kidney failure, unspecified: Secondary | ICD-10-CM

## 2014-10-15 DIAGNOSIS — N189 Chronic kidney disease, unspecified: Secondary | ICD-10-CM

## 2014-10-15 DIAGNOSIS — N401 Enlarged prostate with lower urinary tract symptoms: Secondary | ICD-10-CM | POA: Diagnosis not present

## 2014-10-15 MED ORDER — TAMSULOSIN HCL 0.4 MG PO CAPS: 0.8000 mg | ORAL_CAPSULE | Freq: Every day | ORAL | Status: DC

## 2014-10-15 MED ORDER — BACLOFEN 10 MG PO TABS: 5.0000 mg | ORAL_TABLET | Freq: Three times a day (TID) | ORAL | Status: DC | PRN

## 2014-10-15 MED ORDER — CIPROFLOXACIN HCL 500 MG PO TABS: 500.0000 mg | ORAL_TABLET | Freq: Two times a day (BID) | ORAL | Status: DC

## 2014-10-15 NOTE — Progress Notes (Signed)
   Subjective:    Patient ID: Jeffrey [name], male    DOB: [birth year], 69 y.o.   MRN: [id number]  HPI   BPH with LUTS: - Last visit for same complaint 07/22/14 seen by me, doing well at that time. However since had interval worsening, spoke on telephone and advised to come in on 09/30/14 to [facility] - Describes initial improvement with LUTS (decreased flow, incomplete emptying, nocturia) after trial on Flomax 0.61m daily in 06/2014, did well for 2 months, now worsening. On 09/30/14 he had increased frequency and nocturia, again with poor flow and recurrent voids with reduced amount. No dysuria or hematuria. Had UA negative in office. Went to ED for further eval. Had Bladder Scan with 140cc PVR, Renal [place] hydronephrosis but medical renal disease. Thought may have prostatitis, with slightly boggy and mild tender prostate started on Cipro for prostatitis, x 2 week course. - Today reports urinary symptoms mostly unchanged, without any worsening, seem similar to prior to starting Flomax. He is voiding overall well throughout the day, seems reduced in past 24 hours, x 1 void today. - Admits Left low back pain but no flank or abd pain. - Denies dysuria, hematuria, fevers/chills - ED had scheduled Urology follow-up 11/11/14 at 10:30am with Dr. DDiona Fanti([facility], to establish care)  LEFT LOW BACK PAIN / MUSCLE SPASM: - Reports Left lower back pain for past x 2 days, feels like "muscle is tight", pain with movement. Denies any injury, trauma, or fall - Has tried some Tylenol Extra Str with relief, infrequently. Not tried heat / ice - Denies any radiating pain to legs, bladder / bowel incontinence  CKD-III: - History of known CKD, pt currently on HAART therapy for HIV. - Recently Cr checked in ED 09/30/14, inc to 1.94 - Denies taking NSAIDs  I have reviewed and updated the following as appropriate: allergies and current medications  Social Hx: - Non-smoker. Infrequent smokeless tobacco use (q 6  months)  Review of Systems  See above HPI    Objective:   Physical Exam  Ht 5' 10"  (1.778 m)  Wt 141 lb (63.957 kg)  BMI 20.23 kg/m2  Gen - well-appearing, comfortable, NAD HEENT - MMM Heart - RRR, no murmurs heard Abd - soft, NTND, no masses, non-distended bladder, +active BS Ext - non-tender, no edema, peripheral pulses intact +2 b/l MSK - Back with left lower lumbar paraspinal muscles hypertonic with spasm and mild +TTP slightly limited rotational ROM. No CVAT Skin - warm, dry, no rashes     Assessment & Plan:   See specific A&P problem list for details.

## 2014-10-15 NOTE — Patient Instructions (Signed)
Thank you for coming in to clinic today.  1. It sounds like your Enlarged Prostate (BPH) symptoms initially improved but now have returned. - Increase Flomax 0.44m to 2 tablets (total 0.866m daily same time every day, new rx sent in - Follow-up with [facility] on 11/11/14 as scheduled 2. May have prostatitis, seems less likely but lets finish the entire 4 week course - Refilled Cipro for 2 more weeks 3. For low back pain - consistent with muscle spasm - Start new rx Baclofen 1074mabs - start with half tablet at night for 1-2 nights, then you can start taking half tab 3 times a day (every 8 hours), if helping may increase one of the doses to 5m44mhole tablet) - Take Tylenol Extra Str 1 to 2 tabs up to 3 times a day - Use heating pad or moist heat on your back - Stay active, but avoid heavy lifting or twisting  Please schedule a follow-up appointment with Dr. KaraParks Ranger1 to 3 months for Back Pain follow-up.  If you have any other questions or concerns, please feel free to call the clinic to contact me. You may also schedule an earlier appointment if necessary.  However, if your symptoms get significantly worse, please go to the Emergency Department to seek immediate medical attention.  [name] CMarkesan

## 2014-10-16 ENCOUNTER — Other Ambulatory Visit: Payer: Commercial Managed Care - HMO

## 2014-10-16 DIAGNOSIS — N179 Acute kidney failure, unspecified: Secondary | ICD-10-CM | POA: Diagnosis not present

## 2014-10-16 DIAGNOSIS — N189 Chronic kidney disease, unspecified: Secondary | ICD-10-CM | POA: Diagnosis not present

## 2014-10-16 NOTE — Assessment & Plan Note (Signed)
Consistent with muscle spasm in lower back L-lumbar paraspinal muscles, improve with Tylenol and rest, worse with activity, non-radiating. Not related to urinary symptoms. Afebrile  Plan: 1. Baclofen 63m - titrate every 2-3 days from 551mqhs > TID > up to 10 TID PRN 2. May use Tylenol regularly, heating pad, relative rest, avoid heavy lifting / twisting 3. RTC 1 month if not improved

## 2014-10-16 NOTE — Assessment & Plan Note (Signed)
Elevated SCr to 1.94 (5/25) from 1.56 (3/7), suspect AKI likely pre-renal, given recent work-up in ED with normal PVR and no obstruction on Renal US. Known CKD-III, h/o HTN, concern on HAART therapy for HIV followed by ID. - B/l SCr 1.4 to 1.6 - Renal US (09/30/14) - no obstruction or hydro. Medical renal disease.  Plan: 1. Phoned pt to return to clinic on 10/16/14 for lab only visit to re-check BMET for SCr follow-up from ED. Labs not drawn on day of visit 10/15/14. 2. Advised no NSAIDs 3. Improve PO hydration 4. If significantly elevated SCr will contact [facility] to see if can expedite apt to urgent and move up from 11/11/2014

## 2014-10-16 NOTE — Assessment & Plan Note (Signed)
Presumed prostatitis with mild tenderness and boggy prostate on DRE 09/30/14 at [facility].  Plan: 1. Complete 4 week course Cipro 544m BID, given rx for last 2 weeks today starting 10/15/14

## 2014-10-16 NOTE — Progress Notes (Signed)
BMP DONE TODAY Saraiya Kozma 

## 2014-10-16 NOTE — Assessment & Plan Note (Signed)
Interval worsening symptoms, now with resumed LUTS (frequency/nocturia, incomplete emptying with small voids) over past 1-2 months, had initially improved on Flomax 0.59m trial. No significant evidence of obstruction on Renal [place] or retention on bladder scan. - DRE 09/30/14 - mild smooth enlarged prostate, some concern for prostatitis at that time - ED Bladder Scan / PVR 140cc (09/30/14) - Renal [place](09/30/14) - no hydro or obstruction. Medical renal disease.  Plan: 1. Trial increase Flomax to 2 tabs daily, 0.868m- new rx given 2. Complete prostatitis course with Cipro 50074mID x total 4 weeks (given rx for last 2 weeks of course) 3. Establish with Urology as planned, 11/11/14 - may need repeat bladder scan, urodynamics

## 2014-10-17 ENCOUNTER — Telehealth: Payer: Self-pay | Admitting: Family Medicine

## 2014-10-17 LAB — BASIC METABOLIC PANEL WITH GFR
BUN: 23 mg/dL (ref 6–23)
CHLORIDE: 103 meq/L (ref 96–112)
CO2: 24 mEq/L (ref 19–32)
Calcium: 8.9 mg/dL (ref 8.4–10.5)
Creat: 1.77 mg/dL — ABNORMAL HIGH (ref 0.50–1.35)
GFR, Est African American: 45 mL/min — ABNORMAL LOW
GFR, Est Non African American: 39 mL/min — ABNORMAL LOW
Glucose, Bld: 89 mg/dL (ref 70–99)
Potassium: 4 mEq/L (ref 3.5–5.3)
Sodium: 140 mEq/L (ref 135–145)

## 2014-10-19 NOTE — Telephone Encounter (Signed)
Called patient to follow-up on recent BMET lab results. SCr elevated to 1.94 consistent with AoCKD. Repeat SCr down to 1.77 with gradual improvement, trending towards baseline 1.4 to 1.6. Continues to void, no new changes to his urinary symptoms. He has follow-up coming with Urology. Does not seem to be acutely obstructed or retaining urine. Re-emphasized plan for PO hydration improved and avoid all NSAIDs. Return criteria given.  Jeffrey [name], [place], PGY-2

## 2014-11-04 ENCOUNTER — Telehealth: Payer: Self-pay | Admitting: Family Medicine

## 2014-11-04 NOTE — Telephone Encounter (Signed)
Pt comes in to clinic. States he needs [facility] referral for his Urology appt sch'd for 11/11/14.   [facility] Auth ID # [id number] 11/11/14 thru 05/10/15 Approved for 6 visits.

## 2014-11-11 DIAGNOSIS — N529 Male erectile dysfunction, unspecified: Secondary | ICD-10-CM | POA: Diagnosis not present

## 2014-11-11 DIAGNOSIS — N401 Enlarged prostate with lower urinary tract symptoms: Secondary | ICD-10-CM | POA: Diagnosis not present

## 2014-11-11 DIAGNOSIS — N138 Other obstructive and reflux uropathy: Secondary | ICD-10-CM | POA: Diagnosis not present

## 2014-11-11 DIAGNOSIS — R312 Other microscopic hematuria: Secondary | ICD-10-CM | POA: Diagnosis not present

## 2014-12-05 NOTE — Progress Notes (Signed)
Patient ID: Jeffrey [name], male   DOB: 08-Jul-1945, 69 y.o.   MRN: [id number]     Cardiology Office Note   Date:  12/07/2014   ID:  Jeffrey [name] 07-18-1945, MRN [id number]  PCP:  Nobie Putnam, DO  Cardiologist:   Jenkins Rouge, MD   No chief complaint on file.     History of Present Illness: Jeffrey [name] is a 69 y.o. male who presents for evaluation of dyspnea.  Has chronic protatitis with urinary frequency.  Baseline CR 1.77.  HIV diagnosed in 1999 CD 4 count tends to run low despite Rx around 400 in March .   Saw family practice in May and complained about dyspnea.    Exertional dyspnea and chest discomfort both of which improve with rest. Notes this has been increasing recently. . Did have an episode of right sided mid axillary pain lin May  that radiated down his right arm and lasted for 3-4 days. Notes some PND as well. No orthopnea. No dyspnea or diaphoresis with this. Notes he has not had this type of symptoms before.   No recent CXR done   Has had protatitis/UTI recently on antibiotics  Original HIV from male partner in 1999    Past Medical History  Diagnosis Date  . HIV (human immunodeficiency virus infection) 1999    Past Surgical History  Procedure Laterality Date  . Cholecystectomy  1982     Current Outpatient Prescriptions  Medication Sig Dispense Refill  . Abacavir-Dolutegravir-Lamivud 600-50-300 MG TABS Take 1 tablet by mouth daily. 30 tablet 11  . acetaminophen (TYLENOL) 500 MG tablet Take 1 tablet (500 mg total) by mouth every 6 (six) hours as needed for pain. 30 tablet 0  . finasteride (PROSCAR) 5 MG tablet     . Multiple Vitamins-Minerals (MULTIVITAMIN PO) Take 1 tablet by mouth daily.    Marland Kitchen omeprazole (PRILOSEC) 20 MG capsule Take 1 capsule (20 mg total) by mouth daily. (Patient taking differently: Take 40 mg by mouth daily. ) 90 capsule 3  . omeprazole (PRILOSEC) 40 MG capsule Take 40 mg by mouth daily.    . tamsulosin (FLOMAX) 0.4 MG  CAPS capsule Take 2 capsules (0.8 mg total) by mouth daily. 60 capsule 2   No current facility-administered medications for this visit.    Allergies:   Baclofen; Efavirenz; and Nevirapine    Social History:  The patient  reports that he has never smoked. He has quit using smokeless tobacco. His smokeless tobacco use included Chew. He reports that he does not drink alcohol or use illicit drugs.   Family History:  The patient's family history includes Heart attack (age of onset: 32) in his mother; Hypertension in his mother.    ROS:  Please see the history of present illness.   Otherwise, review of systems are positive for none.   All other systems are reviewed and negative.    PHYSICAL EXAM: VS:  There were no vitals taken for this visit. , BMI There is no weight on file to calculate BMI. Affect appropriate Healthy:  appears stated age 71: normal Neck supple with no adenopathy JVP normal no bruits no thyromegaly Lungs clear with no wheezing and good diaphragmatic motion Heart:  S1/S2 no murmur, no rub, gallop or click PMI normal Abdomen: benighn, BS positve, no tenderness, no AAA no bruit.  No HSM or HJR Distal pulses intact with no bruits No edema Neuro non-focal Skin warm and dry No muscular weakness  EKG:  09/30/14  SR rate 89  Normal    Recent Labs: 09/30/2014: ALT 18; Hemoglobin 13.9; Platelets 176 10/16/2014: BUN 23; Creat 1.77*; Potassium 4.0; Sodium 140    Lipid Panel    Component Value Date/Time   CHOL 181 07/13/2014 0925   TRIG 93 07/13/2014 0925   HDL 57 07/13/2014 0925   CHOLHDL 3.2 07/13/2014 0925   VLDL 19 07/13/2014 0925   LDLCALC 105* 07/13/2014 0925      Wt Readings from Last 3 Encounters:  10/15/14 63.957 kg (141 lb)  09/30/14 64.864 kg (143 lb)  07/28/14 66.906 kg (147 lb 8 oz)      Other studies Reviewed: Additional studies/ records that were reviewed today include:  Epic notes and notes [facility] ID/Family practice.    ASSESSMENT  AND PLAN:  1.  Dyspnea:  Normal exam and ECG  Needs CXR given HIV.  Echo to assess RV/LV function given DCM that can be associated with HIV 2. Chest pain: atypical normal ECG  F/u ETT 3. HIV:  CD 4 counts low f/u ID  CXR today 4. UTI  Frequency improved hematuria gone finished with cipro continue proscar    Current medicines are reviewed at length with the patient today.  The patient does not have concerns regarding medicines.  The following changes have been made:  no change  Labs/ tests ordered today include:  Echo CXR ETT   No orders of the defined types were placed in this encounter.     Disposition:   FU with me next available      Signed, Jenkins Rouge, MD  12/07/2014 11:20 AM    [facility] [place], [place], [place]  [address] Phone: [phone]; Fax: [phone]

## 2014-12-07 ENCOUNTER — Ambulatory Visit (INDEPENDENT_AMBULATORY_CARE_PROVIDER_SITE_OTHER)
Admission: RE | Admit: 2014-12-07 | Discharge: 2014-12-07 | Disposition: A | Discharge status: Home / Self Care | Payer: Commercial Managed Care - HMO | Source: Ambulatory Visit | Attending: Cardiovascular Disease | Admitting: Cardiovascular Disease

## 2014-12-07 ENCOUNTER — Ambulatory Visit (INDEPENDENT_AMBULATORY_CARE_PROVIDER_SITE_OTHER): Payer: Commercial Managed Care - HMO | Admitting: Cardiovascular Disease

## 2014-12-07 ENCOUNTER — Encounter: Payer: Self-pay | Admitting: Cardiovascular Disease

## 2014-12-07 DIAGNOSIS — R0600 Dyspnea, unspecified: Secondary | ICD-10-CM

## 2014-12-07 DIAGNOSIS — R05 Cough: Secondary | ICD-10-CM | POA: Diagnosis not present

## 2014-12-07 NOTE — Patient Instructions (Signed)
Medication Instructions:  NO CHANGES  Labwork: NONE  Testing/Procedures: A chest x-ray takes a picture of the organs and structures inside the chest, including the heart, lungs, and blood vessels. This test can show several things, including, whether the heart is enlarges; whether fluid is building up in the lungs; and whether pacemaker / defibrillator leads are still in place.   Your physician has requested that you have an exercise tolerance test. For further information please visit [email or web address]. Please also follow instruction sheet, as given.    Your physician has requested that you have an echocardiogram. Echocardiography is a painless test that uses sound waves to create images of your heart. It provides your doctor with information about the size and shape of your heart and how well your heart's chambers and valves are working. This procedure takes approximately one hour. There are no restrictions for this procedure.   Follow-Up: NEXT AVAILABLE  Any Other Special Instructions Will Be Listed Below (If Applicable).

## 2014-12-08 ENCOUNTER — Other Ambulatory Visit: Payer: Self-pay

## 2014-12-08 ENCOUNTER — Ambulatory Visit (HOSPITAL_COMMUNITY): Payer: Commercial Managed Care - HMO | Attending: Cardiovascular Disease

## 2014-12-08 DIAGNOSIS — I340 Nonrheumatic mitral (valve) insufficiency: Secondary | ICD-10-CM | POA: Diagnosis not present

## 2014-12-08 DIAGNOSIS — R0600 Dyspnea, unspecified: Secondary | ICD-10-CM

## 2014-12-08 DIAGNOSIS — I071 Rheumatic tricuspid insufficiency: Secondary | ICD-10-CM | POA: Diagnosis not present

## 2014-12-08 DIAGNOSIS — I10 Essential (primary) hypertension: Secondary | ICD-10-CM | POA: Diagnosis not present

## 2014-12-08 DIAGNOSIS — Z8249 Family history of ischemic heart disease and other diseases of the circulatory system: Secondary | ICD-10-CM | POA: Insufficient documentation

## 2014-12-09 ENCOUNTER — Other Ambulatory Visit: Payer: Self-pay | Admitting: Internal Medicine

## 2014-12-09 DIAGNOSIS — K219 Gastro-esophageal reflux disease without esophagitis: Secondary | ICD-10-CM

## 2014-12-11 ENCOUNTER — Encounter: Payer: Self-pay | Admitting: Gastroenterology

## 2014-12-23 ENCOUNTER — Telehealth (HOSPITAL_COMMUNITY): Payer: Self-pay

## 2014-12-23 NOTE — Telephone Encounter (Signed)
Encounter complete. 

## 2014-12-25 ENCOUNTER — Ambulatory Visit (HOSPITAL_COMMUNITY)
Admission: RE | Admit: 2014-12-25 | Discharge: 2014-12-25 | Disposition: A | Discharge status: Home / Self Care | Payer: Commercial Managed Care - HMO | Source: Ambulatory Visit | Attending: Cardiovascular Disease | Admitting: Cardiovascular Disease

## 2014-12-25 DIAGNOSIS — R0600 Dyspnea, unspecified: Secondary | ICD-10-CM | POA: Insufficient documentation

## 2014-12-25 LAB — EXERCISE TOLERANCE TEST
Estimated workload: 8.5 METS
Exercise duration (min): 7 min
MPHR: 153 {beats}/min
Peak HR: 133 {beats}/min
Percent HR: 86 %
RPE: 16
Rest HR: 98 {beats}/min

## 2015-01-04 NOTE — Progress Notes (Signed)
Patient ID: Jeffrey [name], male   DOB: 1945-10-29, 69 y.o.   MRN: [id number]     Cardiology Office Note   Date:  01/04/2015   ID:  DESHAN, HEMMELGARN April 14, 1946, MRN [id number]  PCP:  Nobie Putnam, DO  Cardiologist:   Jenkins Rouge, MD   No chief complaint on file.     History of Present Illness: Jeffrey [name] is a 69 y.o. male who presented  for evaluation of dyspnea Initially in August 2016.  Has chronic protatitis with urinary frequency.  Baseline CR 1.77.  HIV diagnosed in 1999 CD 4 count tends to run low despite Rx around 400 in March .   Saw family practice in May and complained about dyspnea.    Exertional dyspnea and chest discomfort both of which improve with rest. Notes this has been increasing recently. . Did have an episode of right sided mid axillary pain lin May  that radiated down his right arm and lasted for 3-4 days. Notes some PND as well. No orthopnea. No dyspnea or diaphoresis with this. Notes he has not had this type of symptoms before.   Reviewed f/u studies  Echo:  12/08/14 Study Conclusions  - Left ventricle: The cavity size was normal. Wall thickness was normal. Systolic function was normal. The estimated ejection fraction was in the range of 50% to 55%. Wall motion was normal; there were no regional wall motion abnormalities. Doppler parameters are consistent with abnormal left ventricular relaxation (grade 1 diastolic dysfunction).  Impressions:  - Low normal LV function; grade 1 diastolic dysfunction; trace MR and TR.  ETT:  12/25/14  Normal 7:00 minutes on treadmill    Has had protatitis/UTI recently on antibiotics  Original HIV from male partner in [place] 12/07/14 IMPRESSION: 1. Hyperinflation/bronchitic changes. 2. No focal acute pulmonary abnormality.    Past Medical History  Diagnosis Date  . HIV (human immunodeficiency virus infection) 1999    Past Surgical History  Procedure Laterality Date  .  Cholecystectomy  1982     Current Outpatient Prescriptions  Medication Sig Dispense Refill  . Abacavir-Dolutegravir-Lamivud 600-50-300 MG TABS Take 1 tablet by mouth daily. 30 tablet 11  . acetaminophen (TYLENOL) 500 MG tablet Take 1 tablet (500 mg total) by mouth every 6 (six) hours as needed for pain. 30 tablet 0  . finasteride (PROSCAR) 5 MG tablet     . Multiple Vitamins-Minerals (MULTIVITAMIN PO) Take 1 tablet by mouth daily.    Marland Kitchen omeprazole (PRILOSEC) 20 MG capsule TAKE ONE CAPSULE BY MOUTH ONCE DAILY 90 capsule 2  . tamsulosin (FLOMAX) 0.4 MG CAPS capsule Take 2 capsules (0.8 mg total) by mouth daily. 60 capsule 2   No current facility-administered medications for this visit.    Allergies:   Baclofen; Efavirenz; and Nevirapine    Social History:  The patient  reports that he has never smoked. He has quit using smokeless tobacco. His smokeless tobacco use included Chew. He reports that he does not drink alcohol or use illicit drugs.   Family History:  The patient's family history includes Heart attack (age of onset: 104) in his mother; Hypertension in his mother.    ROS:  Please see the history of present illness.   Otherwise, review of systems are positive for none.   All other systems are reviewed and negative.    PHYSICAL EXAM: VS:  There were no vitals taken for this visit. , BMI There is no weight on file to calculate BMI.  Affect appropriate Healthy:  appears stated age 7: normal Neck supple with no adenopathy JVP normal no bruits no thyromegaly Lungs clear with no wheezing and good diaphragmatic motion Heart:  S1/S2 no murmur, no rub, gallop or click PMI normal Abdomen: benighn, BS positve, no tenderness, no AAA no bruit.  No HSM or HJR Distal pulses intact with no bruits No edema Neuro non-focal Skin warm and dry No muscular weakness    EKG:  09/30/14  SR rate 89  Normal    Recent Labs: 09/30/2014: ALT 18; Hemoglobin 13.9; Platelets 176 10/16/2014: BUN  23; Creat 1.77*; Potassium 4.0; Sodium 140    Lipid Panel    Component Value Date/Time   CHOL 181 07/13/2014 0925   TRIG 93 07/13/2014 0925   HDL 57 07/13/2014 0925   CHOLHDL 3.2 07/13/2014 0925   VLDL 19 07/13/2014 0925   LDLCALC 105* 07/13/2014 0925      Wt Readings from Last 3 Encounters:  10/15/14 63.957 kg (141 lb)  09/30/14 64.864 kg (143 lb)  07/28/14 66.906 kg (147 lb 8 oz)      Other studies Reviewed: Additional studies/ records that were reviewed today include:  Epic notes and notes [facility]/Family practice.    ASSESSMENT AND PLAN:  1.  Dyspnea:  Normal exam and ECG  EF normal by echo only grade one diastolic dysfunction CXR with COPD/bronchitis 2. Chest pain: atypical normal ECG  Normal ETT  3. HIV:  CD 4 counts low f/u ID  CXR with bronchitic changes  4. UTI  Frequency improved hematuria gone finished with cipro continue proscar    Current medicines are reviewed at length with the patient today.  The patient does not have concerns regarding medicines.  The following changes have been made:  no change  Labs/ tests ordered today include: None   No orders of the defined types were placed in this encounter.     Disposition:   FU with me PRN    Signed, Jenkins Rouge, MD  01/04/2015 2:32 PM    [facility] [place], [place], [place]  [address] Phone: [phone]; Fax: [phone]

## 2015-01-05 ENCOUNTER — Encounter: Payer: Self-pay | Admitting: Cardiovascular Disease

## 2015-01-05 ENCOUNTER — Ambulatory Visit (INDEPENDENT_AMBULATORY_CARE_PROVIDER_SITE_OTHER): Payer: Commercial Managed Care - HMO | Admitting: Cardiovascular Disease

## 2015-01-05 VITALS — BP 106/64 | HR 78 | Ht 70.0 in | Wt 150.8 lb

## 2015-01-05 DIAGNOSIS — R0600 Dyspnea, unspecified: Secondary | ICD-10-CM

## 2015-01-05 NOTE — Patient Instructions (Signed)
Medication Instructions:  NONE  Labwork: NONE  Testing/Procedures: NONE  Follow-Up: Your physician recommends that you schedule a follow-up appointment in: AS NEEDED  Any Other Special Instructions Will Be Listed Below (If Applicable).  CALL AND  ASK FOR  Letasha Kershaw   [phone] TO SEE ABOUT   DR [place]

## 2015-01-06 ENCOUNTER — Encounter: Payer: Self-pay | Admitting: Family Medicine

## 2015-01-06 ENCOUNTER — Ambulatory Visit (INDEPENDENT_AMBULATORY_CARE_PROVIDER_SITE_OTHER): Payer: Commercial Managed Care - HMO | Admitting: Family Medicine

## 2015-01-06 VITALS — BP 118/78 | HR 78 | Temp 98.0°F | Ht 70.0 in | Wt 150.7 lb

## 2015-01-06 DIAGNOSIS — L218 Other seborrheic dermatitis: Secondary | ICD-10-CM | POA: Diagnosis not present

## 2015-01-06 DIAGNOSIS — I10 Essential (primary) hypertension: Secondary | ICD-10-CM

## 2015-01-06 DIAGNOSIS — N401 Enlarged prostate with lower urinary tract symptoms: Secondary | ICD-10-CM | POA: Diagnosis not present

## 2015-01-06 DIAGNOSIS — L299 Pruritus, unspecified: Secondary | ICD-10-CM | POA: Insufficient documentation

## 2015-01-06 DIAGNOSIS — N138 Other obstructive and reflux uropathy: Secondary | ICD-10-CM

## 2015-01-06 DIAGNOSIS — Z23 Encounter for immunization: Secondary | ICD-10-CM

## 2015-01-06 DIAGNOSIS — I5030 Unspecified diastolic (congestive) heart failure: Secondary | ICD-10-CM | POA: Insufficient documentation

## 2015-01-06 DIAGNOSIS — L219 Seborrheic dermatitis, unspecified: Secondary | ICD-10-CM

## 2015-01-06 MED ORDER — FLUOCINOLONE ACETONIDE 0.01 % EX SHAM: MEDICATED_SHAMPOO | CUTANEOUS | Status: DC

## 2015-01-06 NOTE — Assessment & Plan Note (Signed)
Significant improvement, mostly resolved LUTS. Now only occasional nocturia. - On Finasteride 873m daily, Flomax 0.8273mdaily - Established with [facility], Dr. DaDiona FantiPlan: 1. Follow-up with Urology 9/23, consider titrating down Flomax to 0.73m70maily

## 2015-01-06 NOTE — Assessment & Plan Note (Signed)
Well-controlled HTN  Meds - only Flomax 0.17m, no other anti-HTN CKD-III, h/o complicated with hypotension   Plan:  1. No change today. Continue Flomax, in future consider decreasing per Urology. No other anti-HTN meds needed at this time. 2. Last BMET with SCr 1.77 in 10/2014 3. Lifestyle Mods - Exercise, Dec salt intake, inc K+ rich vegs 4. Monitor BP at home or at drug store occasionally.

## 2015-01-06 NOTE — Patient Instructions (Addendum)
Dear Carma Leaven, Thank you for coming in to clinic today. It was good to see you!  1. For your scalp - I think this is likely Seborrheic Dermatitis (skin itching, irritation) you are using the correct treatment, however if it is not responding, can try stronger prescription medicine. Sent rx for Topical Steroid Shampoo formulation (not the ointment), this one instructions: Apply no more than 1 ounce to scalp once daily; work into [employer] and allow to remain on scalp for ~5 minutes. Remove from hair and scalp by rinsing thoroughly with water. Use for 2 weeks then stop. - Show this to Dr. Megan Salon as well to get his input, as this may be common in HIV patients  2. I'm glad to hear your urinary symptoms are improved. Continue current treatment with Finasteride once daily and Tamsulosin 2 pills daily for now. Follow-up with Urologist as scheduled.  BP 118/78 mmHg  Pulse 78  Temp(Src) 98 F (36.7 C) (Oral)  Ht 5' 10"  (1.778 m)  Wt 150 lb 11.2 oz (68.357 kg)  BMI 21.62 kg/m2   TDap and Pneumonia vaccine today Flu shot at Dr. Megan Salon office  Please schedule a follow-up appointment with Dr. Parks Ranger as needed in 3 to 6 months  If needed you may follow-up with Heart Doctor (Dr. Johnsie Cancel sooner if you develop worsening shortness of breath, chest pain, call if needed)  If you have any other questions or concerns, please feel free to call the clinic to contact me. You may also schedule an earlier appointment if necessary.  However, if your symptoms get significantly worse, please go to the Emergency Department to seek immediate medical attention.  Nobie Putnam, [place]

## 2015-01-06 NOTE — Assessment & Plan Note (Signed)
Consistent with seborrheic dermatitis, unresolved x 4 months, trial OTC Head & Shoulders failed. Mostly itching, flaking, no erythema or sign of infection. - Consider HIV related seborrheic dermatitis, however HIV well controlled  Plan: 1. Trial on Fluocinolone shampoo topical 1oz daily x 2 week trial, may repeat 2 weeks trial in future if improved but unresolved 2. Advised pt to review with Dr. Megan Salon as well 3. Follow-up 1-3 months PRN, may refer to Derm if unresolved

## 2015-01-06 NOTE — Progress Notes (Signed)
Subjective:    Patient ID: Jeffrey [name], male    DOB: 1945/06/13, 69 y.o.   MRN: [id number]  Jeffrey [name] is a 69 y.o. male presenting on 01/06/2015 for Follow-up  HPI   RASH SCALP: - Reports itchy rash on scalp, started in April 2016, does recall similar experience about 15-20 years ago went away with OTC dandruff shampoo before, tried OTC Head & Shoulder Shampoo without any relief, regular use for about past 3-4 months. No other medicines tried.  BPH with LUTS: - Last seen by me 10/15/14 for same complaint, with interval worsening LUTS, increased Flomax to 2 tabs at 0.47m daily and recently had completed Cipro 500 BID x 4 weeks for possible prostatitis, referred to Urology. - Now established with [facility] (Dr. DDiona Fanti on 11/11/14, per chart review had DRE with 50g prostate, Renal [place]ore[place]ith evidence of CKD but no hydro or obstruction. Started on Finasteride 523mqd, continue Flomax 0.57m35maily, and start Sildenafil for ED. - Today reports doing well. Currently has no urinary symptoms, except very occasional nocturia with inc urination at night, seems to relate this to increased PO fluids. Takes medicines without problem. - Next Urology apt with Dr. DahDiona Fanti23/16  CHRONIC HTN: Reports doing well, no concerns. Current Meds - none specifically, taking Flomax 0.57mg60mily   Reports good compliance, took meds today. Tolerating well, w/o complaints. Denies CP, dyspnea, HA, edema, dizziness / lightheadedness  Chronic Grade 1 Diastolic CHF with preserved EF - [facility] with ECG (normal) and ECHO showing Grade 1 Diastolic Dysfunction and mild bronchitic changes on CXR. Additionally ETT (treadmill) with negative and nonischemic. Determined CP is atypical. No further treatments or testing advised. Follow-up as needed. - Today patient is asymptomatic, understands to follow-up in future if develops worsening or new  symptoms  HM: - Due TDap (agree to receive today) - Due Prevnar 13 (agree to receive today) - Due Flu vaccine, will receive at [facility] 9[facility]15   Past Medical History  Diagnosis Date  . HIV (human immunodeficiency virus infection) 1999    Social History   Social History  . Marital Status: Divorced    Spouse Name: N/A  . Number of Children: N/A  . Years of Education: N/A   Occupational History  . retired    Social History Main Topics  . Smoking status: Never Smoker   . Smokeless tobacco: Former UserSystems developerTypes: Chew     Comment: Rare use, about q 6 months  . Alcohol Use: No  . Drug Use: No  . Sexual Activity:    Partners: Female     Comment: declined condoms   Other Topics Concern  . Not on file   Social History Narrative    Current Outpatient Prescriptions on File Prior to Visit  Medication Sig  . Abacavir-Dolutegravir-Lamivud 600-50-300 MG TABS Take 1 tablet by mouth daily.  . acMarland Kitchentaminophen (TYLENOL) 500 MG tablet Take 1 tablet (500 mg total) by mouth every 6 (six) hours as needed for pain.  . finasteride (PROSCAR) 5 MG tablet Take 5 mg by mouth daily.   . Multiple Vitamins-Minerals (MULTIVITAMIN PO) Take 1 tablet by mouth daily.  . omMarland Kitchenprazole (PRILOSEC) 20 MG capsule TAKE ONE CAPSULE BY MOUTH ONCE DAILY  . tamsulosin (FLOMAX) 0.4 MG CAPS capsule Take 2 capsules (0.8 mg total) by mouth daily.   No current facility-administered medications on file prior to visit.    Review  of Systems  Constitutional: Negative for fever, chills, diaphoresis, activity change, appetite change and fatigue.  HENT: Negative for congestion and hearing loss.   Eyes: Negative for visual disturbance.  Respiratory: Negative for cough, chest tightness, shortness of breath and wheezing.   Cardiovascular: Negative for chest pain, palpitations and leg swelling.  Gastrointestinal: Negative for nausea, vomiting, abdominal pain, diarrhea and constipation.  Genitourinary: Negative for dysuria,  frequency and hematuria.       Occasional nocturia  Musculoskeletal: Negative for arthralgias and neck pain.  Skin: Positive for rash (itching on scalp).  Neurological: Negative for dizziness, weakness, light-headedness, numbness and headaches.  Hematological: Negative for adenopathy.  Psychiatric/Behavioral: Negative for behavioral problems and confusion.   Per HPI unless specifically indicated above     Objective:    BP 118/78 mmHg  Pulse 78  Temp(Src) 98 F (36.7 C) (Oral)  Ht 5' 10"  (1.778 m)  Wt 150 lb 11.2 oz (68.357 kg)  BMI 21.62 kg/m2  Wt Readings from Last 3 Encounters:  01/06/15 150 lb 11.2 oz (68.357 kg)  01/05/15 150 lb 12.8 oz (68.402 kg)  10/15/14 141 lb (63.957 kg)    Physical Exam  Constitutional: He is oriented to person, place, and time. He appears well-developed and well-nourished. No distress.  HENT:  Head: Normocephalic and atraumatic.  Mouth/Throat: Oropharynx is clear and moist.  Eyes: Conjunctivae and EOM are normal. Pupils are equal, round, and reactive to light.  Neck: Normal range of motion. Neck supple. No thyromegaly present.  Cardiovascular: Normal rate, regular rhythm, normal heart sounds and intact distal pulses.   No murmur heard. Pulmonary/Chest: Effort normal and breath sounds normal. No respiratory distress. He has no wheezes. He has no rales.  Musculoskeletal: Normal range of motion. He exhibits no edema or tenderness.  Lymphadenopathy:    He has no cervical adenopathy.  Neurological: He is alert and oriented to person, place, and time.  Skin: Skin is warm and dry. Rash (scattered flaking rough non erythematous / non-pigmented spots across scalp, consistent with seborrheic dermatitis. No sign of inflammation or infection.) noted. He is not diaphoretic.  Nursing note and vitals reviewed.  Results for orders placed or performed during the hospital encounter of 12/25/14  Exercise Tolerance Test  Result Value Ref Range   Rest HR 98 bpm    Rest BP 111/84 mmHg   Exercise duration (min) 7 min   Exercise duration (sec)  sec   Estimated workload 8.5 METS   Peak HR 133 bpm   Peak BP 140/74 mmHg   MPHR 153 bpm   Percent HR 86 %   RPE 16       Assessment & Plan:   Problem List Items Addressed This Visit      Cardiovascular and Mediastinum   Diastolic CHF with preserved left ventricular function, NYHA class 2    On ECHO 12/08/14 with Grade 1 Diastolic Dysfunction, preserved LVEF, referred to Cardiology for DOE/SOB and atypical CPs - Established with Dr. Johnsie Cancel [facility]), no follow-up scheduled but may return if needed - Additionally, ETT 12/25/14 normal, no evidence ischemia - Asymptomatic - No other therapy at this time      Essential hypertension    Well-controlled HTN  Meds - only Flomax 0.71m, no other anti-HTN CKD-III, h/o complicated with hypotension   Plan:  1. No change today. Continue Flomax, in future consider decreasing per Urology. No other anti-HTN meds needed at this time. 2. Last BMET with SCr 1.77 in 10/2014 3. Lifestyle Mods -  Exercise, Dec salt intake, inc K+ rich vegs 4. Monitor BP at home or at drug store occasionally.         Musculoskeletal and Integument   Seborrheic dermatitis of scalp - Primary    Consistent with seborrheic dermatitis, unresolved x 4 months, trial OTC Head & Shoulders failed. Mostly itching, flaking, no erythema or sign of infection. - Consider HIV related seborrheic dermatitis, however HIV well controlled  Plan: 1. Trial on Fluocinolone shampoo topical 1oz daily x 2 week trial, may repeat 2 weeks trial in future if improved but unresolved 2. Advised pt to review with Dr. Megan Salon as well 3. Follow-up 1-3 months PRN, may refer to Derm if unresolved      Relevant Medications   Fluocinolone Acetonide 0.01 % SHAM     Genitourinary   BPH with obstruction/lower urinary tract symptoms    Significant improvement, mostly resolved LUTS. Now only occasional nocturia. - On  Finasteride 63m daily, Flomax 0.827mdaily - Established with [facility], Dr. DaDiona FantiPlan: 1. Follow-up with Urology 9/23, consider titrating down Flomax to 0.63m58maily       Other Visit Diagnoses    Need for diphtheria-tetanus-pertussis (Tdap) vaccine        Relevant Orders    Tdap vaccine greater than or equal to 7yo IM (Completed)    Need for pneumococcal vaccine        Relevant Orders    Pneumococcal conjugate vaccine 13-valent (Completed)       Meds ordered this encounter  Medications  . Fluocinolone Acetonide 0.01 % SHAM    Sig: Apply 1 oz to scalp once daily; work into lather leave on scalp for ~5 min. Remove from hair and scalp rinsing thoroughly with water.    Dispense:  120 mL    Refill:  0      Follow up plan: No Follow-up on file.  AleNobie PutnamO VolgaGY-3

## 2015-01-06 NOTE — Assessment & Plan Note (Addendum)
On ECHO 12/08/14 with Grade 1 Diastolic Dysfunction, preserved LVEF, referred to Cardiology for DOE/SOB and atypical CPs - Established with Dr. Johnsie Cancel [facility]), no follow-up scheduled but may return if needed - Additionally, ETT 12/25/14 normal, no evidence ischemia - Asymptomatic - No other therapy at this time

## 2015-01-07 ENCOUNTER — Other Ambulatory Visit: Payer: Commercial Managed Care - HMO

## 2015-01-07 ENCOUNTER — Other Ambulatory Visit: Payer: Self-pay | Admitting: Family Medicine

## 2015-01-07 DIAGNOSIS — B20 Human immunodeficiency virus [HIV] disease: Secondary | ICD-10-CM | POA: Diagnosis not present

## 2015-01-07 DIAGNOSIS — N138 Other obstructive and reflux uropathy: Secondary | ICD-10-CM

## 2015-01-07 DIAGNOSIS — N401 Enlarged prostate with lower urinary tract symptoms: Principal | ICD-10-CM

## 2015-01-07 LAB — COMPREHENSIVE METABOLIC PANEL
ALT: 13 U/L (ref 9–46)
AST: 17 U/L (ref 10–35)
Albumin: 4 g/dL (ref 3.6–5.1)
Alkaline Phosphatase: 69 U/L (ref 40–115)
BUN: 25 mg/dL (ref 7–25)
CHLORIDE: 106 mmol/L (ref 98–110)
CO2: 26 mmol/L (ref 20–31)
Calcium: 8.7 mg/dL (ref 8.6–10.3)
Creat: 1.52 mg/dL — ABNORMAL HIGH (ref 0.70–1.25)
GLUCOSE: 91 mg/dL (ref 65–99)
POTASSIUM: 4.1 mmol/L (ref 3.5–5.3)
Sodium: 139 mmol/L (ref 135–146)
Total Bilirubin: 0.3 mg/dL (ref 0.2–1.2)
Total Protein: 6.2 g/dL (ref 6.1–8.1)

## 2015-01-07 LAB — CBC
HCT: 39.3 % (ref 39.0–52.0)
Hemoglobin: 13.8 g/dL (ref 13.0–17.0)
MCH: 34.5 pg — AB (ref 26.0–34.0)
MCHC: 35.1 g/dL (ref 30.0–36.0)
MCV: 98.3 fL (ref 78.0–100.0)
MPV: 8.4 fL — ABNORMAL LOW (ref 8.6–12.4)
PLATELETS: 167 10*3/uL (ref 150–400)
RBC: 4 MIL/uL — ABNORMAL LOW (ref 4.22–5.81)
RDW: 14.3 % (ref 11.5–15.5)
WBC: 5.2 10*3/uL (ref 4.0–10.5)

## 2015-01-08 LAB — HIV-1 RNA QUANT-NO REFLEX-BLD
HIV 1 RNA Quant: <20 copies/mL (ref <20)
HIV-1 RNA Quant, Log: <1.3 {Log} (ref <1.30)

## 2015-01-08 LAB — T-HELPER CELL (CD4) - (RCID CLINIC ONLY)
CD4 T CELL HELPER: 23 % — AB (ref 33–55)
CD4 T Cell Abs: 360 /uL — ABNORMAL LOW (ref 400–2700)

## 2015-01-21 ENCOUNTER — Encounter: Payer: Self-pay | Admitting: Internal Medicine

## 2015-01-21 ENCOUNTER — Ambulatory Visit (INDEPENDENT_AMBULATORY_CARE_PROVIDER_SITE_OTHER): Payer: Commercial Managed Care - HMO | Admitting: Internal Medicine

## 2015-01-21 VITALS — BP 147/89 | HR 89 | Temp 97.6°F | Resp 18 | Ht 70.0 in | Wt 152.0 lb

## 2015-01-21 DIAGNOSIS — R1310 Dysphagia, unspecified: Secondary | ICD-10-CM | POA: Diagnosis not present

## 2015-01-21 DIAGNOSIS — Z23 Encounter for immunization: Secondary | ICD-10-CM

## 2015-01-21 DIAGNOSIS — B20 Human immunodeficiency virus [HIV] disease: Secondary | ICD-10-CM

## 2015-01-21 NOTE — Progress Notes (Signed)
Patient ID: Jeffrey [name], male   DOB: 07-20-1945, 69 y.o.   MRN: [id number]          Patient Active Problem List   Diagnosis Date Noted  . Human immunodeficiency virus (HIV) disease 02/27/2006    Priority: High  . Left ankle pain 08/07/2013    Priority: Medium  . Dyslipidemia 07/22/2012    Priority: Medium  . Acute-on-chronic kidney injury 07/22/2012    Priority: Medium  . Erectile dysfunction 07/22/2012    Priority: Medium  . GERD 05/17/2010    Priority: Medium  . Dysphagia 03/08/2009    Priority: Medium  . Essential hypertension 02/27/2006    Priority: Medium  . Seborrheic dermatitis of scalp 01/06/2015  . Diastolic CHF with preserved left ventricular function, NYHA class 2 01/06/2015  . Prostatitis 10/15/2014  . Muscle spasm of back 10/15/2014  . Exertional dyspnea 09/30/2014  . Insomnia 07/22/2014  . BPH with obstruction/lower urinary tract symptoms 06/17/2014  . Screening for colon cancer 10/07/2013  . CHEST PAIN 05/17/2010  . CERVICAL LYMPHADENOPATHY 09/19/2007  . CUTANEOUS ERUPTIONS, DRUG-INDUCED 05/28/2006  . CHOLECYSTECTOMY, HX OF 05/28/2006  . ESOPHAGITIS 02/27/2006  . ABSCESS, NECK 02/27/2006  . PNEUMONIA, HX OF 02/27/2006  . ARTHROSCOPY, KNEE, HX OF 02/27/2006    Patient's Medications  New Prescriptions   No medications on file  Previous Medications   ABACAVIR-DOLUTEGRAVIR-LAMIVUD 600-50-300 MG TABS    Take 1 tablet by mouth daily.   ACETAMINOPHEN (TYLENOL) 500 MG TABLET    Take 1 tablet (500 mg total) by mouth every 6 (six) hours as needed for pain.   FINASTERIDE (PROSCAR) 5 MG TABLET    Take 5 mg by mouth daily.    FLUOCINOLONE ACETONIDE 0.01 % SHAM    Apply 1 oz to scalp once daily; work into lather leave on scalp for ~5 min. Remove from hair and scalp rinsing thoroughly with water.   MULTIPLE VITAMINS-MINERALS (MULTIVITAMIN PO)    Take 1 tablet by mouth daily.   OMEPRAZOLE (PRILOSEC) 20 MG CAPSULE    TAKE ONE CAPSULE BY MOUTH ONCE DAILY   TAMSULOSIN (FLOMAX) 0.4 MG CAPS CAPSULE    TAKE TWO CAPSULES BY MOUTH ONCE DAILY  Modified Medications   No medications on file  Discontinued Medications   No medications on file    Subjective: Jeffrey [name] is in for his routine HIV follow-up visit. He is missed only one dose of his Triumeq since his last visit 6 months ago. That occurred when his pharmacy was late refilling his prescription. He is having some slowly progressive dysphagia to pills and solid foods but has not had significant problems swallowing his Triumeq yet. He last had esophageal dilatation in 2011 by Dr. Alben Spittle. He only rarely has symptoms of acid indigestion.   Review of Systems: Pertinent items are noted in HPI.  Past Medical History  Diagnosis Date  . HIV (human immunodeficiency virus infection) 1999    Social History  Substance Use Topics  . Smoking status: Never Smoker   . Smokeless tobacco: Former Systems developer    Types: Chew     Comment: Rare use, about q 6 months  . Alcohol Use: No    Family History  Problem Relation Age of Onset  . Hypertension Mother   . Heart attack Mother 53    Allergies  Allergen Reactions  . Baclofen     Back pain. Patient said it made him feel crazy  . Efavirenz     Rash  . Nevirapine  Rash    Objective:  Filed Vitals:   01/21/15 0928  BP: 147/89  Pulse: 89  Temp: 97.6 F (36.4 C)  TempSrc: Oral  Resp: 18  Height: 5' 10"  (1.778 m)  Weight: 152 lb (68.947 kg)   Body mass index is 21.81 kg/(m^2).  General: He is in good spirits as usual Oral: No oropharyngeal lesions Skin: No rash Lungs: Clear Cor: Regular S1 and S2 with no murmurs   Lab Results Lab Results  Component Value Date   WBC 5.2 01/07/2015   HGB 13.8 01/07/2015   HCT 39.3 01/07/2015   MCV 98.3 01/07/2015   PLT 167 01/07/2015    Lab Results  Component Value Date   CREATININE 1.52* 01/07/2015   BUN 25 01/07/2015   NA 139 01/07/2015   K 4.1 01/07/2015   CL 106 01/07/2015   CO2 26  01/07/2015    Lab Results  Component Value Date   ALT 13 01/07/2015   AST 17 01/07/2015   ALKPHOS 69 01/07/2015   BILITOT 0.3 01/07/2015    Lab Results  Component Value Date   CHOL 181 07/13/2014   HDL 57 07/13/2014   LDLCALC 105* 07/13/2014   TRIG 93 07/13/2014   CHOLHDL 3.2 07/13/2014    Lab Results HIV 1 RNA QUANT (copies/mL)  Date Value  01/07/2015 <20  07/13/2014 <20  01/13/2014 <20   CD4 T CELL ABS (/uL)  Date Value  01/07/2015 360*  07/13/2014 400  01/13/2014 340*     Problem List Items Addressed This Visit      High   Human immunodeficiency virus (HIV) disease    His HIV infection remains under excellent control with an undetectable viral load. I will continue Triumeq and see him back after lab work in 6 months      Relevant Orders   T-helper cell (CD4)- ([facility] clinic only)   HIV 1 RNA quant-no reflex-bld   CBC   Comprehensive metabolic panel   Lipid panel   RPR     Medium   Dysphagia - Primary    He is having progressive dysphasia with solid foods and pills most likely due to recurrent peptic stricture. I suggested that he follow-up with Dr. Deatra Ina for consideration of repeat esophageal dilatation.           [name], MD [facility] [phone] pager   [phone] cell 01/21/2015, 9:48 AM

## 2015-01-21 NOTE — Assessment & Plan Note (Signed)
His HIV infection remains under excellent control with an undetectable viral load. I will continue Triumeq and see him back after lab work in 6 months

## 2015-01-21 NOTE — Patient Instructions (Signed)
Influenza Vaccine (Flu Vaccine, Inactivated or Recombinant) 2014-2015: What You Need to Know 1. Why get vaccinated? Influenza ("flu") is a contagious disease that spreads around the [place] every winter, usually between October and May. Flu is caused by influenza viruses, and is spread mainly by coughing, sneezing, and close contact. Anyone can get flu, but the risk of getting flu is highest among children. Symptoms come on suddenly and may last several days. They can include:  fever/chills  sore throat  muscle aches  fatigue  cough  headache  runny or stuffy nose Flu can make some people much sicker than others. These people include young children, people 65 and older, pregnant women, and people with certain health conditions-such as heart, lung or kidney disease, nervous system disorders, or a weakened immune system. Flu vaccination is especially important for these people, and anyone in close contact with them. Flu can also lead to pneumonia, and make existing medical conditions worse. It can cause diarrhea and seizures in children. Each year thousands of people in the [place] die from flu, and many more are hospitalized. Flu vaccine is the best protection against flu and its complications. Flu vaccine also helps prevent spreading flu from person to person. 2. Inactivated and recombinant flu vaccines You are getting an injectable flu vaccine, which is either an "inactivated" or "recombinant" vaccine. These vaccines do not contain any live influenza virus. They are given by injection with a needle, and often called the "flu shot."  A different live, attenuated (weakened) influenza vaccine is sprayed into the nostrils. This vaccine is described in a separate Vaccine Information Statement. Flu vaccination is recommended every year. Some children 6 months through 8 years of age might need two doses during one year. Flu viruses are always changing. Each year's flu vaccine is made  to protect against 3 or 4 viruses that are likely to cause disease that year. Flu vaccine cannot prevent all cases of flu, but it is the best defense against the disease.  It takes about 2 weeks for protection to develop after the vaccination, and protection lasts several months to a year. Some illnesses that are not caused by influenza virus are often mistaken for flu. Flu vaccine will not prevent these illnesses. It can only prevent influenza. Some inactivated flu vaccine contains a very small amount of a mercury-based preservative called thimerosal. Studies have shown that thimerosal in vaccines is not harmful, but flu vaccines that do not contain a preservative are available. 3. Some people should not get this vaccine Tell the person who gives you the vaccine:  If you have any severe, life-threatening allergies. If you ever had a life-threatening allergic reaction after a dose of flu vaccine, or have a severe allergy to any part of this vaccine, including (for example) an allergy to gelatin, antibiotics, or eggs, you may be advised not to get vaccinated. Most, but not all, types of flu vaccine contain a small amount of egg protein.  If you ever had Guillain-Barr Syndrome (a severe paralyzing illness, also called GBS). Some people with a history of GBS should not get this vaccine. This should be discussed with your doctor.  If you are not feeling well. It is usually okay to get flu vaccine when you have a mild illness, but you might be advised to wait until you feel better. You should come back when you are better. 4. Risks of a vaccine reaction With a vaccine, like any medicine, there is a chance of side   effects. These are usually mild and go away on their own. Problems that could happen after any vaccine:  Brief fainting spells can happen after any medical procedure, including vaccination. Sitting or lying down for about 15 minutes can help prevent fainting, and injuries caused by a fall. Tell  your doctor if you feel dizzy, or have vision changes or ringing in the ears.  Severe shoulder pain and reduced range of motion in the arm where a shot was given can happen, very rarely, after a vaccination.  Severe allergic reactions from a vaccine are very rare, estimated at less than 1 in a million doses. If one were to occur, it would usually be within a few minutes to a few hours after the vaccination. Mild problems following inactivated flu vaccine:  soreness, redness, or swelling where the shot was given  hoarseness  sore, red or itchy eyes  cough  fever  aches  headache  itching  fatigue If these problems occur, they usually begin soon after the shot and last 1 or 2 days. Moderate problems following inactivated flu vaccine:  Young children who get inactivated flu vaccine and pneumococcal vaccine (PCV13) at the same time may be at increased risk for seizures caused by fever. Ask your doctor for more information. Tell your doctor if a child who is getting flu vaccine has ever had a seizure. Inactivated flu vaccine does not contain live flu virus, so you cannot get the flu from this vaccine. As with any medicine, there is a very remote chance of a vaccine causing a serious injury or death. The safety of vaccines is always being monitored. For more information, visit: www.cdc.gov/vaccinesafety/ 5. What if there is a serious reaction? What should I look for?  Look for anything that concerns you, such as signs of a severe allergic reaction, very high fever, or behavior changes. Signs of a severe allergic reaction can include hives, swelling of the face and throat, difficulty breathing, a fast heartbeat, dizziness, and weakness. These would start a few minutes to a few hours after the vaccination. What should I do?  If you think it is a severe allergic reaction or other emergency that can't wait, call 9-1-1 and get the person to the nearest hospital. Otherwise, call your  doctor.  Afterward, the reaction should be reported to the Vaccine Adverse Event Reporting System (VAERS). Your doctor should file this report, or you can do it yourself through the VAERS web site at www.vaers.hhs.gov, or by calling 1-800-822-7967. VAERS does not give medical advice. 6. The National Vaccine Injury Compensation Program The National Vaccine Injury Compensation Program (VICP) is a federal program that was created to compensate people who may have been injured by certain vaccines. Persons who believe they may have been injured by a vaccine can learn about the program and about filing a claim by calling 1-800-338-2382 or visiting the VICP website at www.hrsa.gov/vaccinecompensation. There is a time limit to file a claim for compensation. 7. How can I learn more?  Ask your health care provider.  Call your local or state health department.  Contact the Centers for Disease Control and Prevention (CDC):  Call 1-800-232-4636 (1-800-CDC-INFO) or  Visit CDC's website at www.cdc.gov/flu CDC Vaccine Information Statement (Interim) Inactivated Influenza Vaccine (12/24/2012) Document Released: 02/16/2006 Document Revised: 09/08/2013 Document Reviewed: 04/11/2013 ExitCare Patient Information 2015 [employer]. This information is not intended to replace advice given to you by your health care provider. Make sure you discuss any questions you have with your health   care provider.  

## 2015-01-21 NOTE — Assessment & Plan Note (Signed)
He is having progressive dysphasia with solid foods and pills most likely due to recurrent peptic stricture. I suggested that he follow-up with Dr. Deatra Ina for consideration of repeat esophageal dilatation.

## 2015-01-29 DIAGNOSIS — N401 Enlarged prostate with lower urinary tract symptoms: Secondary | ICD-10-CM | POA: Diagnosis not present

## 2015-01-29 DIAGNOSIS — N529 Male erectile dysfunction, unspecified: Secondary | ICD-10-CM | POA: Diagnosis not present

## 2015-01-29 DIAGNOSIS — N138 Other obstructive and reflux uropathy: Secondary | ICD-10-CM | POA: Diagnosis not present

## 2015-04-20 ENCOUNTER — Encounter: Payer: Self-pay | Admitting: Family Medicine

## 2015-04-20 ENCOUNTER — Ambulatory Visit (INDEPENDENT_AMBULATORY_CARE_PROVIDER_SITE_OTHER): Payer: Commercial Managed Care - HMO | Admitting: Family Medicine

## 2015-04-20 VITALS — BP 131/89 | HR 81 | Temp 98.4°F | Ht 70.0 in | Wt 153.1 lb

## 2015-04-20 DIAGNOSIS — K219 Gastro-esophageal reflux disease without esophagitis: Secondary | ICD-10-CM

## 2015-04-20 MED ORDER — RANITIDINE HCL 150 MG PO TABS: 150.0000 mg | ORAL_TABLET | Freq: Two times a day (BID) | ORAL | Status: DC

## 2015-04-20 MED ORDER — OMEPRAZOLE 40 MG PO CPDR: 40.0000 mg | DELAYED_RELEASE_CAPSULE | Freq: Every day | ORAL | Status: DC

## 2015-04-20 NOTE — Assessment & Plan Note (Signed)
Suspect present symptoms are related to GERD. Abdominal exam is benign and patient is well appearing. With age and history of HIV (albeit well controlled) warrants further evaluation if we are unable to manage it with traditional GERD medications. Also of note, patient has never had screening colonoscopy. - Will increase PPI strength to omeprazole 68m daily and add H2 blocker zantac 1517mtwice daily  - Patient will call and let usKoreanow if this regimen does not improve his symptoms in several weeks. If no improvement, will refer to GI at that time. - Given handout on scheduling screening colonoscopy - Red flags reviewed in detail with patient.

## 2015-04-20 NOTE — Progress Notes (Signed)
Date of Visit: 04/20/2015   HPI:  Pt presents for a same day appointment to discuss abdominal pain.  For about 2-3 weeks has had abdominal pain in the epigastric area. Began the week before Thanksgiving. Has had nausea off and on but no vomiting, fevers, blood in stool, weight loss, or changes in bowel habits. Stools 2-3 times per day, which is normal for him. Years ago had ulcers in stomach. Denies black/tarry stools. Pain is intermittent, lasting a few hours at a time. No obvious triggers. Tums has helped some. No other alleviating factors. Patient with history of GERD and prior cholecystectomy. Also describes having a procedure done which sounds like esophageal dilatation in the past.  ROS: See HPI  [place]: history of HIV, diastolic CHF, hypertension, GERD, hyperlipidemia  PHYSICAL EXAM: BP 131/89 mmHg  Pulse 81  Temp(Src) 98.4 F (36.9 C) (Oral)  Ht 5' 10"  (1.778 m)  Wt 153 lb 1.6 oz (69.446 kg)  BMI 21.97 kg/m2 Gen: NAD, pleasant, cooperative, well appearing male, appears younger than stated age HEENT: normocephalic, atraumatic. moist mucous membranes. Heart: regular rate and rhythm, no murmur Lungs: clear to auscultation bilaterally, normal work of breathing  Abdomen: normoactive bowel sounds. Soft, nontender to palpation, no masses or organomegaly. No peritoneal signs.  Neuro: alert, grossly nonfocal, speech normal  ASSESSMENT/PLAN:  GERD Suspect present symptoms are related to GERD. Abdominal exam is benign and patient is well appearing. With age and history of HIV (albeit well controlled) warrants further evaluation if we are unable to manage it with traditional GERD medications. Also of note, patient has never had screening colonoscopy. - Will increase PPI strength to omeprazole 24m daily and add H2 blocker zantac 1555mtwice daily  - Patient will call and let usKoreanow if this regimen does not improve his symptoms in several weeks. If no improvement, will refer to GI at that  time. - Given handout on scheduling screening colonoscopy - Red flags reviewed in detail with patient.     FOLLOW UP: F/u as needed if symptoms worsen or do not improve.   BrLandenMcArdelia MemsMDHouse

## 2015-04-20 NOTE — Patient Instructions (Signed)
Increase omeprazole to 40m once a day. Sent in new prescription Also start ranitidine (zantac) 159mtwice a day - sent this in as well  If pain is better on this regimen -okay to stay on this for a while, can call for refills -still will need colonoscopy - see handout on how to schedule  If pain is not better with this: -call usKorean about 3 weeks for a referral to a gastroenterologist -they will see you in the office and likely talk about an upper endoscopy at the same time as your colonoscopy  Be well, Dr. McArdelia Mems

## 2015-06-10 ENCOUNTER — Telehealth: Payer: Self-pay | Admitting: Family Medicine

## 2015-06-10 ENCOUNTER — Encounter: Payer: Self-pay | Admitting: Gastroenterology

## 2015-06-10 DIAGNOSIS — B20 Human immunodeficiency virus [HIV] disease: Secondary | ICD-10-CM

## 2015-06-10 NOTE — Telephone Encounter (Signed)
Pt called because he has an appointment with the lab at [facility] and an appointment with Dr. Megan Salon.He has to have a referral for both of these so that his insurance will this. Please do this a head of time since he can not afford to get a bill for these visits. jw

## 2015-06-10 NOTE — Telephone Encounter (Signed)
Ordered ambulatory referral to Infectious Disease to continue chronic management of HIV by Dr [name] at [facility].  Please let me know if any other referral orders are needed given patient's request.  Nobie Putnam, [place], PGY-3

## 2015-06-11 NOTE — Telephone Encounter (Signed)
[facility] [place] # [id number] Valid to 06/11/15 TO 12/08/15. Sent to [facility].

## 2015-06-29 ENCOUNTER — Other Ambulatory Visit: Payer: Commercial Managed Care - HMO

## 2015-06-29 DIAGNOSIS — B20 Human immunodeficiency virus [HIV] disease: Secondary | ICD-10-CM | POA: Diagnosis not present

## 2015-06-29 DIAGNOSIS — Z79899 Other long term (current) drug therapy: Secondary | ICD-10-CM | POA: Diagnosis not present

## 2015-06-29 LAB — CBC
HEMATOCRIT: 42 % (ref 39.0–52.0)
Hemoglobin: 14.3 g/dL (ref 13.0–17.0)
MCH: 33.6 pg (ref 26.0–34.0)
MCHC: 34 g/dL (ref 30.0–36.0)
MCV: 98.6 fL (ref 78.0–100.0)
MPV: 8.9 fL (ref 8.6–12.4)
Platelets: 189 10*3/uL (ref 150–400)
RBC: 4.26 MIL/uL (ref 4.22–5.81)
RDW: 13.9 % (ref 11.5–15.5)
WBC: 4.6 10*3/uL (ref 4.0–10.5)

## 2015-06-29 LAB — LIPID PANEL
CHOL/HDL RATIO: 4.2 ratio (ref <=5.0)
Cholesterol: 208 mg/dL — ABNORMAL HIGH (ref 125–200)
HDL: 49 mg/dL (ref >=40)
LDL CALC: 128 mg/dL (ref <130)
TRIGLYCERIDES: 154 mg/dL — AB (ref <150)
VLDL: 31 mg/dL — ABNORMAL HIGH (ref <30)

## 2015-06-29 LAB — COMPREHENSIVE METABOLIC PANEL
ALT: 16 U/L (ref 9–46)
AST: 16 U/L (ref 10–35)
Albumin: 4 g/dL (ref 3.6–5.1)
Alkaline Phosphatase: 83 U/L (ref 40–115)
BUN: 21 mg/dL (ref 7–25)
CALCIUM: 9 mg/dL (ref 8.6–10.3)
CO2: 29 mmol/L (ref 20–31)
Chloride: 106 mmol/L (ref 98–110)
Creat: 1.62 mg/dL — ABNORMAL HIGH (ref 0.70–1.25)
GLUCOSE: 112 mg/dL — AB (ref 65–99)
POTASSIUM: 4.2 mmol/L (ref 3.5–5.3)
Sodium: 140 mmol/L (ref 135–146)
Total Bilirubin: 0.3 mg/dL (ref 0.2–1.2)
Total Protein: 6.6 g/dL (ref 6.1–8.1)

## 2015-06-30 LAB — HIV-1 RNA QUANT-NO REFLEX-BLD: HIV 1 RNA Quant: <20 copies/mL (ref <20)

## 2015-06-30 LAB — T-HELPER CELL (CD4) - (RCID CLINIC ONLY)
CD4 % Helper T Cell: 21 % — ABNORMAL LOW (ref 33–55)
CD4 T Cell Abs: 320 /uL — ABNORMAL LOW (ref 400–2700)

## 2015-06-30 LAB — RPR

## 2015-07-12 ENCOUNTER — Other Ambulatory Visit: Payer: Self-pay | Admitting: Family Medicine

## 2015-07-12 ENCOUNTER — Ambulatory Visit (AMBULATORY_SURGERY_CENTER): Payer: Self-pay

## 2015-07-12 VITALS — Ht 70.0 in | Wt 158.6 lb

## 2015-07-12 DIAGNOSIS — K219 Gastro-esophageal reflux disease without esophagitis: Secondary | ICD-10-CM

## 2015-07-12 DIAGNOSIS — Z1211 Encounter for screening for malignant neoplasm of colon: Secondary | ICD-10-CM

## 2015-07-12 MED ORDER — NA SULFATE-K SULFATE-MG SULF 17.5-3.13-1.6 GM/177ML PO SOLN: ORAL | Status: DC

## 2015-07-12 NOTE — Progress Notes (Signed)
Per pt, no allergies to soy or egg products.Pt not taking any weight loss meds or using  O2 at home. 

## 2015-07-13 ENCOUNTER — Encounter: Payer: Self-pay | Admitting: Internal Medicine

## 2015-07-13 ENCOUNTER — Ambulatory Visit (INDEPENDENT_AMBULATORY_CARE_PROVIDER_SITE_OTHER): Payer: Commercial Managed Care - HMO | Admitting: Internal Medicine

## 2015-07-13 DIAGNOSIS — B20 Human immunodeficiency virus [HIV] disease: Secondary | ICD-10-CM | POA: Diagnosis not present

## 2015-07-13 MED ORDER — EFAVIRENZ-EMTRICITAB-TENOFOVIR 600-200-300 MG PO TABS: 1.0000 | ORAL_TABLET | Freq: Every day | ORAL | Status: DC

## 2015-07-13 NOTE — Progress Notes (Signed)
Patient ID: Jeffrey [name], male   DOB: 02-Dec-1945, 70 y.o.   MRN: [id number]          Patient Active Problem List   Diagnosis Date Noted  . Human immunodeficiency virus (HIV) disease ([place]) 02/27/2006    Priority: High  . Left ankle pain 08/07/2013    Priority: Medium  . Dyslipidemia 07/22/2012    Priority: Medium  . Acute-on-chronic kidney injury ([place]) 07/22/2012    Priority: Medium  . Erectile dysfunction 07/22/2012    Priority: Medium  . GERD 05/17/2010    Priority: Medium  . Dysphagia 03/08/2009    Priority: Medium  . Essential hypertension 02/27/2006    Priority: Medium  . Seborrheic dermatitis of scalp 01/06/2015  . Diastolic CHF with preserved left ventricular function, NYHA class 2 ([place]) 01/06/2015  . Prostatitis 10/15/2014  . Muscle spasm of back 10/15/2014  . Exertional dyspnea 09/30/2014  . Insomnia 07/22/2014  . BPH with obstruction/lower urinary tract symptoms 06/17/2014  . Screening for colon cancer 10/07/2013  . CHEST PAIN 05/17/2010  . CERVICAL LYMPHADENOPATHY 09/19/2007  . CUTANEOUS ERUPTIONS, DRUG-INDUCED 05/28/2006  . CHOLECYSTECTOMY, HX OF 05/28/2006  . ESOPHAGITIS 02/27/2006  . ABSCESS, NECK 02/27/2006  . PNEUMONIA, HX OF 02/27/2006  . ARTHROSCOPY, KNEE, HX OF 02/27/2006    Patient's Medications  New Prescriptions   No medications on file  Previous Medications   ABACAVIR-DOLUTEGRAVIR-LAMIVUD 600-50-300 MG TABS    Take 1 tablet by mouth daily.   ACETAMINOPHEN (TYLENOL) 500 MG TABLET    Take 1 tablet (500 mg total) by mouth every 6 (six) hours as needed for pain.   FINASTERIDE (PROSCAR) 5 MG TABLET    Take 5 mg by mouth daily.    MULTIPLE VITAMINS-MINERALS (MULTIVITAMIN PO)    Take 1 tablet by mouth daily.   NA SULFATE-K SULFATE-MG SULF (SUPREP BOWEL PREP) SOLN    Suprep as directed / no substitutions   OMEPRAZOLE (PRILOSEC) 40 MG CAPSULE    TAKE ONE CAPSULE BY MOUTH ONCE DAILY   RANITIDINE (ZANTAC) 150 MG TABLET    Take 1 tablet (150 mg total)  by mouth 2 (two) times daily.   TAMSULOSIN (FLOMAX) 0.4 MG CAPS CAPSULE    TAKE TWO CAPSULES BY MOUTH ONCE DAILY  Modified Medications   No medications on file  Discontinued Medications   FLUOCINOLONE ACETONIDE 0.01 % SHAM    Apply 1 oz to scalp once daily; work into lather leave on scalp for ~5 min. Remove from hair and scalp rinsing thoroughly with water.    Subjective: Jeffrey [name] is in for his routine HIV follow-up visit. He developed flulike illness shortly after getting the flu vaccine last fall. He had a few days of nausea with some vomiting and missed one dose of his Triumeq. He has recovered and is doing very well. He has no complaints.   Review of Systems: Review of Systems  Constitutional: Negative for fever, chills, weight loss, malaise/fatigue and diaphoresis.  HENT: Negative for sore throat.   Respiratory: Negative for cough, sputum production and shortness of breath.   Cardiovascular: Negative for chest pain.  Gastrointestinal: Negative for heartburn, nausea, vomiting and diarrhea.  Genitourinary: Negative for dysuria.  Musculoskeletal: Negative for myalgias and joint pain.  Skin: Negative for rash.  Neurological: Negative for dizziness and headaches.  Psychiatric/Behavioral: Negative for depression and substance abuse. The patient is not nervous/anxious.     Past Medical History  Diagnosis Date  . HIV (human immunodeficiency virus infection) ([place]) 1999  Social History  Substance Use Topics  . Smoking status: Never Smoker   . Smokeless tobacco: Former Systems developer    Types: Chew     Comment: Rare use, about q 6 months  . Alcohol Use: No    Family History  Problem Relation Age of Onset  . Hypertension Mother   . Heart attack Mother 30    Allergies  Allergen Reactions  . Baclofen     Back pain. Patient said it made him feel crazy  . Efavirenz     Rash  . Nevirapine     Rash    Objective:  Filed Vitals:   07/13/15 1031  BP: 142/93  Pulse: 75  Temp: 98.1  F (36.7 C)  TempSrc: Oral  Height: 5' 10"  (1.778 m)  Weight: 158 lb (71.668 kg)   Body mass index is 22.67 kg/(m^2).  Physical Exam  Constitutional: He is oriented to person, place, and time.  He is in good spirits as usual.  Eyes: Conjunctivae are normal.  Cardiovascular: Normal rate and regular rhythm.   No murmur heard. Pulmonary/Chest: Breath sounds normal.  Abdominal: Soft. There is no tenderness.  Musculoskeletal: Normal range of motion.  Neurological: He is alert and oriented to person, place, and time.  Skin: No rash noted.  Psychiatric: Mood and affect normal.    Lab Results Lab Results  Component Value Date   WBC 4.6 06/29/2015   HGB 14.3 06/29/2015   HCT 42.0 06/29/2015   MCV 98.6 06/29/2015   PLT 189 06/29/2015    Lab Results  Component Value Date   CREATININE 1.62* 06/29/2015   BUN 21 06/29/2015   NA 140 06/29/2015   K 4.2 06/29/2015   CL 106 06/29/2015   CO2 29 06/29/2015    Lab Results  Component Value Date   ALT 16 06/29/2015   AST 16 06/29/2015   ALKPHOS 83 06/29/2015   BILITOT 0.3 06/29/2015    Lab Results  Component Value Date   CHOL 208* 06/29/2015   HDL 49 06/29/2015   LDLCALC 128 06/29/2015   TRIG 154* 06/29/2015   CHOLHDL 4.2 06/29/2015    Lab Results HIV 1 RNA QUANT (copies/mL)  Date Value  06/29/2015 <20  01/07/2015 <20  07/13/2014 <20   CD4 T CELL ABS (/uL)  Date Value  06/29/2015 320*  01/07/2015 360*  07/13/2014 400      Problem List Items Addressed This Visit      High   Human immunodeficiency virus (HIV) disease ([place])    His viral load has been undetectable consistently since restarting antiretroviral therapy in 2014. He will continue Triumeq and follow-up after lab work in 6 months.      Relevant Orders   T-helper cell (CD4)- ([facility] clinic only)   HIV 1 RNA quant-no reflex-bld   CBC   Comprehensive metabolic panel   Lipid panel   RPR        [name], MD [facility] for [place] [phone] pager   [phone] cell 07/13/2015, 10:49 AM

## 2015-07-13 NOTE — Assessment & Plan Note (Signed)
His viral load has been undetectable consistently since restarting antiretroviral therapy in 2014. He will continue Triumeq and follow-up after lab work in 6 months.

## 2015-07-26 ENCOUNTER — Encounter: Payer: Commercial Managed Care - HMO | Admitting: Gastroenterology

## 2015-07-28 ENCOUNTER — Telehealth: Payer: Self-pay | Admitting: Gastroenterology

## 2015-07-28 NOTE — Telephone Encounter (Signed)
Called pt back, gave new times for prep instructions, pt marked out old dates and times while we went through each one, pt will call back if he has any other questions, pt verbalized understanding of instructions-adm

## 2015-08-04 ENCOUNTER — Ambulatory Visit (AMBULATORY_SURGERY_CENTER): Payer: Commercial Managed Care - HMO | Admitting: Gastroenterology

## 2015-08-04 ENCOUNTER — Encounter: Payer: Self-pay | Admitting: Gastroenterology

## 2015-08-04 VITALS — BP 129/86 | HR 62 | Temp 97.7°F | Resp 17 | Ht 70.0 in | Wt 158.0 lb

## 2015-08-04 DIAGNOSIS — I10 Essential (primary) hypertension: Secondary | ICD-10-CM | POA: Diagnosis not present

## 2015-08-04 DIAGNOSIS — Z1211 Encounter for screening for malignant neoplasm of colon: Secondary | ICD-10-CM | POA: Diagnosis present

## 2015-08-04 DIAGNOSIS — D122 Benign neoplasm of ascending colon: Secondary | ICD-10-CM | POA: Diagnosis not present

## 2015-08-04 DIAGNOSIS — D120 Benign neoplasm of cecum: Secondary | ICD-10-CM | POA: Diagnosis not present

## 2015-08-04 MED ORDER — SODIUM CHLORIDE 0.9 % IV SOLN: 500.0000 mL | INTRAVENOUS | Status: DC

## 2015-08-04 NOTE — Patient Instructions (Signed)
YOU HAD AN ENDOSCOPIC PROCEDURE TODAY AT [facility]:   Refer to the procedure report that was given to you for any specific questions about what was found during the examination.  If the procedure report does not answer your questions, please call your gastroenterologist to clarify.  If you requested that your care partner not be given the details of your procedure findings, then the procedure report has been included in a sealed envelope for you to review at your convenience later.  YOU SHOULD EXPECT: Some feelings of bloating in the abdomen. Passage of more gas than usual.  Walking can help get rid of the air that was put into your GI tract during the procedure and reduce the bloating. If you had a lower endoscopy (such as a colonoscopy or flexible sigmoidoscopy) you may notice spotting of blood in your stool or on the toilet paper. If you underwent a bowel prep for your procedure, you may not have a normal bowel movement for a few days.  Please Note:  You might notice some irritation and congestion in your nose or some drainage.  This is from the oxygen used during your procedure.  There is no need for concern and it should clear up in a day or so.  SYMPTOMS TO REPORT IMMEDIATELY:   Following lower endoscopy (colonoscopy or flexible sigmoidoscopy):  Excessive amounts of blood in the stool  Significant tenderness or worsening of abdominal pains  Swelling of the abdomen that is new, acute  Fever of 100F or higher   For urgent or emergent issues, a gastroenterologist can be reached at any hour by calling [phone].   DIET: Your first meal following the procedure should be a small meal and then it is ok to progress to your normal diet. Heavy or fried foods are harder to digest and may make you feel nauseous or bloated.  Likewise, meals heavy in dairy and vegetables can increase bloating.  Drink plenty of fluids but you should avoid alcoholic beverages for 24  hours.  ACTIVITY:  You should plan to take it easy for the rest of today and you should NOT DRIVE or use heavy machinery until tomorrow (because of the sedation medicines used during the test).    FOLLOW UP: Our staff will call the number listed on your records the next business day following your procedure to check on you and address any questions or concerns that you may have regarding the information given to you following your procedure. If we do not reach you, we will leave a message.  However, if you are feeling well and you are not experiencing any problems, there is no need to return our call.  We will assume that you have returned to your regular daily activities without incident.  If any biopsies were taken you will be contacted by phone or by letter within the next 1-3 weeks.  Please call us at [phone] if you have not heard about the biopsies in 3 weeks.    SIGNATURES/CONFIDENTIALITY: You and/or your care partner have signed paperwork which will be entered into your electronic medical record.  These signatures attest to the fact that that the information above on your After Visit Summary has been reviewed and is understood.  Full responsibility of the confidentiality of this discharge information lies with you and/or your care-partner.    Handouts were given to your care partner on polyps and hemorrhoids. Please hold aspirin, aspirin products, and any NSAIDS meds for 2  weeks. You may resume your other current medications today. Await biopsy results. Please call if any questions or concerns.

## 2015-08-04 NOTE — Progress Notes (Signed)
Called to room to assist during endoscopic procedure.  Patient ID and intended procedure confirmed with present staff. Received instructions for my participation in the procedure from the performing physician.  

## 2015-08-04 NOTE — Op Note (Signed)
[facility] Patient Name: Jeffrey [name] Procedure Date: 08/04/2015 2:01 PM MRN: [id number] Endoscopist: Remo Lipps P. Havery Moros , MD Age: 70 Referring MD:  Date of Birth: 05/01/1946 Gender: Male Procedure:                Colonoscopy Indications:              Screening for colorectal malignant neoplasm Medicines:                Monitored Anesthesia Care Procedure:                Pre-Anesthesia Assessment:                           - Prior to the procedure, a History and Physical                            was performed, and patient medications and                            allergies were reviewed. The patient's tolerance of                            previous anesthesia was also reviewed. The risks                            and benefits of the procedure and the sedation                            options and risks were discussed with the patient.                            All questions were answered, and informed consent                            was obtained. Prior Anticoagulants: The patient has                            taken no previous anticoagulant or antiplatelet                            agents. ASA Grade Assessment: III - A patient with                            severe systemic disease. After reviewing the risks                            and benefits, the patient was deemed in                            satisfactory condition to undergo the procedure.                           After obtaining informed consent, the colonoscope  was passed under direct vision. Throughout the                            procedure, the patient's blood pressure, pulse, and                            oxygen saturations were monitored continuously. The                            Model CF-HQ190L [id number]) scope was introduced                            through the anus and advanced to the the cecum,                            identified by appendiceal orifice  and ileocecal                            valve. The colonoscopy was performed without                            difficulty. The patient tolerated the procedure                            well. The quality of the bowel preparation was                            adequate. The ileocecal valve, appendiceal orifice,                            and rectum were photographed. Scope In: 2:14:01 PM Scope Out: 2:28:51 PM Scope Withdrawal Time: 0 hours 13 minutes 26 seconds  Total Procedure Duration: 0 hours 14 minutes 50 seconds  Findings:      The perianal and digital rectal examinations were normal.      A 5 mm polyp was found in the cecum. The polyp was sessile. The polyp       was removed with a cold snare. Resection was complete, but the polyp       tissue was not retrieved.      A 5 mm polyp was found in the ascending colon. The polyp was sessile.       The polyp was removed with a cold snare. Resection and retrieval were       complete.      Non-bleeding internal hemorrhoids were found during retroflexion.      The exam was otherwise without abnormality. Complications:            No immediate complications. Estimated blood loss:                            Minimal. Estimated Blood Loss:     Estimated blood loss was minimal. Impression:               - One 5 mm polyp in the cecum, removed with a cold  snare. Complete resection. Polyp tissue not                            retrieved.                           - One 5 mm polyp in the ascending colon, removed                            with a cold snare. Resected and retrieved.                           - Non-bleeding internal hemorrhoids.                           - The examination was otherwise normal. Recommendation:           - Patient has a contact number available for                            emergencies. The signs and symptoms of potential                            delayed complications were discussed with  the                            patient. Return to normal activities tomorrow.                            Written discharge instructions were provided to the                            patient.                           - Resume previous diet.                           - Continue present medications.                           - No aspirin, ibuprofen, naproxen, or other                            non-steroidal anti-inflammatory drugs for 2 weeks                            after polyp removal.                           - Await pathology results.                           - Repeat colonoscopy is recommended for                            surveillance. The colonoscopy date will be  determined after pathology results from today's                            exam become available for review. Procedure Code(s):        --- Professional ---                           [phone], Colonoscopy, flexible; with removal of                            tumor(s), polyp(s), or other lesion(s) by snare                            technique CPT copyright 2016 American Medical Association. All rights reserved. Remo Lipps P. Brookelle Pellicane, MD 08/04/2015 2:31:45 PM This report has been signed electronically. Number of Addenda: 0 Referring MD:      Olin Hauser

## 2015-08-04 NOTE — Progress Notes (Signed)
No problems noted in the recovery room. maw 

## 2015-08-04 NOTE — Progress Notes (Signed)
To PACU pt awake and alert, report to RN

## 2015-08-05 ENCOUNTER — Telehealth: Payer: Self-pay

## 2015-08-05 NOTE — Telephone Encounter (Signed)
  Follow up Call-  Call back number 08/04/2015  Post procedure Call Back phone  # [phone]  Permission to leave phone message Yes     Patient questions:  Do you have a fever, pain , or abdominal swelling? No. Pain Score  0 *  Have you tolerated food without any problems? Yes.    Have you been able to return to your normal activities? Yes.    Do you have any questions about your discharge instructions: Diet   No. Medications  No. Follow up visit  No.  Do you have questions or concerns about your Care? No.  Actions: * If pain score is 4 or above: No action needed, pain <4.

## 2015-08-10 ENCOUNTER — Encounter: Payer: Self-pay | Admitting: Gastroenterology

## 2015-08-10 ENCOUNTER — Other Ambulatory Visit: Payer: Self-pay | Admitting: Family Medicine

## 2015-08-10 DIAGNOSIS — K219 Gastro-esophageal reflux disease without esophagitis: Secondary | ICD-10-CM

## 2015-08-15 ENCOUNTER — Other Ambulatory Visit: Payer: Self-pay | Admitting: Family Medicine

## 2015-08-15 DIAGNOSIS — N401 Enlarged prostate with lower urinary tract symptoms: Principal | ICD-10-CM

## 2015-08-15 DIAGNOSIS — N138 Other obstructive and reflux uropathy: Secondary | ICD-10-CM

## 2015-08-23 ENCOUNTER — Telehealth: Payer: Self-pay | Admitting: Family Medicine

## 2015-08-23 DIAGNOSIS — K219 Gastro-esophageal reflux disease without esophagitis: Secondary | ICD-10-CM

## 2015-08-23 MED ORDER — OMEPRAZOLE 40 MG PO CPDR: 40.0000 mg | DELAYED_RELEASE_CAPSULE | Freq: Every day | ORAL | Status: DC

## 2015-08-23 NOTE — Telephone Encounter (Signed)
Pt called because he is having a really hard time with the Zantac. He is always nauseated in the morning or evening whenever he takes this. He also has a really bad case of being constipated and he has never been that way before. He would like to stop taking this and has been on this for about 4 months and really doesn't feel the need. Please call him to discuss and he will not be taking this until after he speaks to the doctor. jw

## 2015-08-23 NOTE — Telephone Encounter (Addendum)
Called patient back, reviewed recent course with last visit 04/2015 saw Dr Ardelia Mems for GERD, at that time he was increased from Omeprazole 20 to 65m daily, and added Zantac 1541mBID. Patient tried Zantac for several months but he admits to side effect of nausea with each dose. He would like to discontinue this medication and continue high dose PPI for now. I advised him that this would be fine to see, as Zantac is mainly symptom control and doesn't have other long-term benefits. Discontinued Zantac and refilled Omeprazole 4054maily +2 refills, advised him to notify us [place] follow-up in 4-8 weeks to see if GERD remains controlled off Zantac, if worsening or new concerns, would need to return for referral to GI.  Future plans to titrate back down to Omeprazole 68m5mily after several weeks if GERD is controlled.  [name] CLutakY-3

## 2015-09-13 ENCOUNTER — Encounter: Payer: Self-pay | Admitting: Family Medicine

## 2015-09-13 ENCOUNTER — Ambulatory Visit (INDEPENDENT_AMBULATORY_CARE_PROVIDER_SITE_OTHER): Payer: Commercial Managed Care - HMO | Admitting: Family Medicine

## 2015-09-13 VITALS — BP 146/85 | HR 79 | Temp 98.1°F | Ht 70.0 in | Wt 160.0 lb

## 2015-09-13 DIAGNOSIS — R7309 Other abnormal glucose: Secondary | ICD-10-CM | POA: Diagnosis not present

## 2015-09-13 DIAGNOSIS — N138 Other obstructive and reflux uropathy: Secondary | ICD-10-CM

## 2015-09-13 DIAGNOSIS — N401 Enlarged prostate with lower urinary tract symptoms: Secondary | ICD-10-CM

## 2015-09-13 DIAGNOSIS — R81 Glycosuria: Secondary | ICD-10-CM

## 2015-09-13 DIAGNOSIS — R3914 Feeling of incomplete bladder emptying: Secondary | ICD-10-CM | POA: Diagnosis not present

## 2015-09-13 LAB — POCT URINALYSIS DIPSTICK
Bilirubin, UA: NEGATIVE
Blood, UA: NEGATIVE
GLUCOSE UA: 100
Ketones, UA: NEGATIVE
LEUKOCYTES UA: NEGATIVE
Nitrite, UA: NEGATIVE
PROTEIN UA: 100
Spec Grav, UA: 1.025
UROBILINOGEN UA: 0.2
pH, UA: 7

## 2015-09-13 LAB — POCT UA - MICROSCOPIC ONLY

## 2015-09-13 LAB — POCT GLYCOSYLATED HEMOGLOBIN (HGB A1C): HEMOGLOBIN A1C: 5.2

## 2015-09-13 NOTE — Progress Notes (Signed)
Subjective:    Patient ID: Jeffrey [name], male    DOB: Jul 05, 1945, 70 y.o.   MRN: [id number]  Follow-up BPH  HPI  BPH with urinary obstruction / LUTS: - Last saw patient in 12/2014 for BPH - Reports first noticed return of symptoms about 2-3 weeks ago with difficulty voiding with weak stream but felt like could still empty most of bladder, however now symptoms have changed to now more urinary frequency with a significantly improved stream strength, now voiding more during the day and increased nocturia. - Mostly drinks water during the day, does consume some tea 1-2x daily not every day - Continued taking Flomax 0.36m daily, Finasteride 538mdaily - last urology Dr DaDiona Fanti/2017, improved on Finasteride & Flomax 0.8 - Admits intermittent ED, has been on sildenafil in the past - Denies any dysuria, abdominal pain, back pain, hematuria, difficulty voiding or hesitancy, fever/chills   Social History  Substance Use Topics  . Smoking status: Never Smoker   . Smokeless tobacco: Former [employer] developer  Types: Chew     Comment: Rare use, about q 6 months  . Alcohol Use: No   Review of Systems  See above HPI    Objective:   Physical Exam  Constitutional: He appears well-developed and well-nourished. No distress.  Well-appearing, comfortable, cooperative  Cardiovascular: Normal rate, regular rhythm, normal heart sounds and intact distal pulses.   No murmur heard. Pulmonary/Chest: Effort normal.  Abdominal: Soft. Bowel sounds are normal. He exhibits no distension and no mass. There is no tenderness.  No palpable mass or distended bladder. No urgency on palpation.  Musculoskeletal: He exhibits no edema.  Neurological: He is alert.  Skin: Skin is warm and dry. No rash noted. He is not diaphoretic.  Nursing note and vitals reviewed.   BP 146/85 mmHg  Pulse 79  Temp(Src) 98.1 F (36.7 C) (Oral)  Ht 5' 10"  (1.778 m)  Wt 160 lb (72.576 kg)  BMI 22.96 kg/m2     Assessment & Plan:    Problem List Items Addressed This Visit    BPH with obstruction/lower urinary tract symptoms - Primary    Recurrent urinary obstructive/LUTS symptoms recently with unclear trigger, then since resolved now increased voiding/frequency with strong stream. Seems stable on current regimen. No clinical concern for UTI. UA in office unremarkable and no sign of UTI or micro hematuria, however positive glucose 100 (prior glucosuria as well)  Plan: 1. Reassurance, continue Finasteride 51m69maily, Flomax 0.8mg46mily (future consider titrate down Flomax to 0.4 again, now that Finasteride has full time to take effect), will defer initial decision to Urology, sees Dr DahlDiona Fanti017 2. Avoid frequent tea or other natural diuretics, continue hydration 3. Check A1c given glucosuria - may be contributing to frequency - results 5.2 normal 4. Follow-up as needed - future consider switch sildenafil to tadalafil as indicated daily for BPH (if recurrent urinary obstructive symptoms again)       Other Visit Diagnoses    Feeling of incomplete bladder emptying        Relevant Orders    POCT urinalysis dipstick (Completed)    POCT UA - Microscopic Only (Completed)    Other abnormal glucose        Prior impaired fasting glucose on labs >100, and prior glucosuria (today 100 on UA), check A1c today = 5.2 normal.    Relevant Orders    POCT glycosylated hemoglobin (Hb A1C) (Completed)    Glucosuria  Relevant Orders    POCT glycosylated hemoglobin (Hb A1C) (Completed)       Return in about 3 months (around 12/14/2015) for physical.  Nobie Putnam, [place], PGY-3

## 2015-09-13 NOTE — Patient Instructions (Signed)
Thank you for coming in to clinic today.  1. You may have episodes of difficulty voiding, but it sounds like symptoms have improved now. Regarding increased urinary frequency, your urine had some glucose or sugar in it, this may be a sign of elevated blood sugar - We will check A1c screening test - for 3 month average blood sugar - Will call with results  - Follow-up with [facility] on 01/2016 as scheduled  Diet Recommendations for Diabetes   Starchy (carb) foods include: Bread, rice, pasta, potatoes, corn, crackers, bagels, muffins, all baked goods.   Protein foods include: Meat, fish, poultry, eggs, dairy foods, and beans such as pinto and kidney beans (beans also provide carbohydrate).   1. Eat at least 3 meals and 1-2 snacks per day. Never go more than 4-5 hours while awake without eating.   2. Limit starchy foods to TWO per meal and ONE per snack. ONE portion of a starchy  food is equal to the following:   - ONE slice of bread (or its equivalent, such as half of a hamburger bun).   - 1/2 cup of a "scoopable" starchy food such as potatoes or rice.   - 1 OUNCE (28 grams) of starchy snacks (crackers or pretzels, look on label).   - 15 grams of carbohydrate as shown on food label.   3. Both lunch and dinner should include a protein food, a carb food, and vegetables.   - Obtain twice as many veg's as protein or carbohydrate foods for both lunch and dinner.   - Try to keep frozen veg's on hand for a quick vegetable serving.     - Fresh or frozen veg's are best.   4. Breakfast should always include protein.     Please schedule a follow-up appointment with new doctor in 3 months for Annual Physical in August. Return sooner if needed.  If you have any other questions or concerns, please feel free to call the clinic to contact me. You may also schedule an earlier appointment if necessary.  However, if your symptoms get significantly worse, please go to the Emergency Department to  seek immediate medical attention.  Nobie Putnam, [place]

## 2015-09-14 NOTE — Assessment & Plan Note (Signed)
Recurrent urinary obstructive/LUTS symptoms recently with unclear trigger, then since resolved now increased voiding/frequency with strong stream. Seems stable on current regimen. No clinical concern for UTI. UA in office unremarkable and no sign of UTI or micro hematuria, however positive glucose 100 (prior glucosuria as well)  Plan: 1. Reassurance, continue Finasteride 72m daily, Flomax 0.840mdaily (future consider titrate down Flomax to 0.4 again, now that Finasteride has full time to take effect), will defer initial decision to Urology, sees Dr DaDiona Fanti/2017 2. Avoid frequent tea or other natural diuretics, continue hydration 3. Check A1c given glucosuria - may be contributing to frequency - results 5.2 normal 4. Follow-up as needed - future consider switch sildenafil to tadalafil as indicated daily for BPH (if recurrent urinary obstructive symptoms again)

## 2015-10-11 ENCOUNTER — Encounter: Payer: Self-pay | Admitting: Family Medicine

## 2015-10-11 ENCOUNTER — Ambulatory Visit (INDEPENDENT_AMBULATORY_CARE_PROVIDER_SITE_OTHER): Payer: Commercial Managed Care - HMO | Admitting: Family Medicine

## 2015-10-11 VITALS — BP 138/86 | HR 71 | Temp 98.0°F | Ht 70.0 in | Wt 159.6 lb

## 2015-10-11 DIAGNOSIS — R040 Epistaxis: Secondary | ICD-10-CM | POA: Diagnosis not present

## 2015-10-11 MED ORDER — ASPIRIN EC 81 MG PO TBEC: 81.0000 mg | DELAYED_RELEASE_TABLET | Freq: Every day | ORAL | Status: AC

## 2015-10-11 NOTE — Assessment & Plan Note (Signed)
Likely more posterior epistaxis given recent procedure, maybe triggered by O2 but duration seems odd, perhaps recurrent bleeding if minor traumas from manipulation for itching. Clinically well appearing today without active bleeding. No clear evidence anterior source of epistaxis, but general nasal mucosa is mildly erythematous R>L. No mass or polyp.  Plan: 1. Reassurance, likely self limited 2. Trial nasal saline to moisturize 3. Return criteria, if persistent or changing would proceed with ENT referral for improved visualization of posterior nasopharynx

## 2015-10-11 NOTE — Progress Notes (Signed)
   Subjective:    Patient ID: Jeffrey [name], male    DOB: 1945/05/12, 70 y.o.   MRN: [id number]  Jeffrey [name] is a 70 y.o. male presenting on 10/11/2015 for Epistaxis   HPI  EPISTAXIS, Recurrent: - Reports that this is a new problem, seemed to have started back on 08/04/15 following a colonoscopy procedure, he had had monitored anesthesia with supplemental O2/monitoring, but per procedure note was not intubated or have any other devices used in nasopharynx. Following the procedure at home had symptoms with "twitching and tingling" in Right upper nostril/nose. Initial bleeding started next day, brisk bright red bleeding only Right side nare up to 20 min, resolved with pressure. Then recurrent bleeding daily for about 1 week, then tapered off and improved to only infrequent brief episodes of epistaxis q 2-3 days over past 2 months. Last bleeding 3-4 days ago. None today. - No history of seasonal allergies or allergic rhinitis. Not using anti-histamine or nasal steroid. - Takes daily baby ASA 110m Denies any recent illness or URI symptoms, coughing, nasal congestion, HA, sneezing, nasal/facial trauma   Social History  Substance Use Topics  . Smoking status: Never Smoker   . Smokeless tobacco: Former [employer] developer   Types: Chew     Comment: Rare use, about q 6 months  . Alcohol Use: No    Review of Systems Per HPI unless specifically indicated above     Objective:    BP 138/86 mmHg  Pulse 71  Temp(Src) 98 F (36.7 C) (Oral)  Ht 5' 10"  (1.778 m)  Wt 159 lb 9.6 oz (72.394 kg)  BMI 22.90 kg/m2  Wt Readings from Last 3 Encounters:  10/11/15 159 lb 9.6 oz (72.394 kg)  09/13/15 160 lb (72.576 kg)  08/04/15 158 lb (71.668 kg)    Physical Exam  Constitutional: He appears well-developed and well-nourished. No distress.  Well-appearing, comfortable, cooperative  HENT:  Head: Normocephalic and atraumatic.  Mouth/Throat: Oropharynx is clear and moist.  Nose appears normal externally. Nares  patent bilaterally. No significant turbinate edema. No significant deviation of nasal septum, however R-nare seems slightly limited compared to L. R-nasal mucosa turbinates/septum mildly erythematous without obvious identified active bleed or lesion. No polyp or mass. No congestion.  Cardiovascular: Normal rate and intact distal pulses.   Neurological: He is alert.  Skin: Skin is warm and dry. He is not diaphoretic.  Nursing note and vitals reviewed.      Assessment & Plan:   Problem List Items Addressed This Visit    Epistaxis - Primary    Likely more posterior epistaxis given recent procedure, maybe triggered by O2 but duration seems odd, perhaps recurrent bleeding if minor traumas from manipulation for itching. Clinically well appearing today without active bleeding. No clear evidence anterior source of epistaxis, but general nasal mucosa is mildly erythematous R>L. No mass or polyp.  Plan: 1. Reassurance, likely self limited 2. Trial nasal saline to moisturize 3. Return criteria, if persistent or changing would proceed with ENT referral for improved visualization of posterior nasopharynx         Meds ordered this encounter  Medications  . aspirin EC 81 MG tablet    Sig: Take 1 tablet (81 mg total) by mouth daily.      Follow up plan: Return in about 4 weeks (around 11/08/2015), or if symptoms worsen or fail to improve, for recurrent epistaxis.   ANobie Putnam [place] PGY-3

## 2015-10-11 NOTE — Patient Instructions (Signed)
Thank you for coming in to clinic today.  1. I don't see a clear cause of your nose bleeding (epistaxis), maybe irritation from the oxygen during the colonoscopy recently, if there was an area of irritation and bleeding deeper back this may have taken time to heal, especially while on aspirin. It could re bleed with minimal trauma. - Try to avoid touching nose and putting pressure on it - Use nasal saline (simpy saline, isotonic, NOT hypertonic or allergy relief) sprays in each nostril several times a day as needed to keep moist  If significant worsening persistent bleeding, not stopping within 30 min, nose pain, purulence / pus, acute congestion or can't breath out of nose, difficulty swallowing, or other concerning symptoms, follow-up or contact our office and we can refer you to ENT.  Please schedule a follow-up appointment with Dr Parks Ranger / New Doctor as needed over next 3-4 weeks for nose bleeds  If you have any other questions or concerns, please feel free to call the clinic to contact me. You may also schedule an earlier appointment if necessary.  However, if your symptoms get significantly worse, please go to the Emergency Department to seek immediate medical attention.  Nobie Putnam, [place]

## 2015-10-17 ENCOUNTER — Other Ambulatory Visit: Payer: Self-pay | Admitting: Internal Medicine

## 2015-10-17 ENCOUNTER — Ambulatory Visit (INDEPENDENT_AMBULATORY_CARE_PROVIDER_SITE_OTHER): Payer: Commercial Managed Care - HMO

## 2015-10-17 ENCOUNTER — Encounter (HOSPITAL_COMMUNITY): Payer: Self-pay | Admitting: Emergency Medicine

## 2015-10-17 ENCOUNTER — Ambulatory Visit (HOSPITAL_COMMUNITY)
Admission: EM | Admit: 2015-10-17 | Discharge: 2015-10-17 | Disposition: A | Discharge status: Home / Self Care | Payer: Commercial Managed Care - HMO | Attending: Emergency Medicine | Admitting: Emergency Medicine

## 2015-10-17 DIAGNOSIS — J40 Bronchitis, not specified as acute or chronic: Secondary | ICD-10-CM | POA: Diagnosis not present

## 2015-10-17 DIAGNOSIS — R05 Cough: Secondary | ICD-10-CM | POA: Diagnosis not present

## 2015-10-17 DIAGNOSIS — B20 Human immunodeficiency virus [HIV] disease: Secondary | ICD-10-CM

## 2015-10-17 MED ORDER — PREDNISONE 50 MG PO TABS: ORAL_TABLET | ORAL | Status: DC

## 2015-10-17 MED ORDER — BENZONATATE 100 MG PO CAPS: 100.0000 mg | ORAL_CAPSULE | Freq: Three times a day (TID) | ORAL | Status: DC | PRN

## 2015-10-17 NOTE — Discharge Instructions (Signed)
You have bronchitis. Take prednisone as prescribed. Use tessalon as needed for cough. You should see improvement in the next 3-5 days. The cough may take 4-6 weeks to fully go away. If you develop fevers, difficulty breathing, or are just not getting better, please come back or go to the emergency room.

## 2015-10-17 NOTE — ED Provider Notes (Signed)
CSN: [id number]     Arrival date & time 10/17/15  1837 History   First MD Initiated Contact with Patient 10/17/15 1846     Chief Complaint  Patient presents with  . Cough   (Consider location/radiation/quality/duration/timing/severity/associated sxs/prior Treatment) HPI He is a 70 year old man here for evaluation of cough. He states this started on Tuesday and worsened in the last 2 days. It is nonproductive. He does report some shortness of breath last night. No wheezing. No fevers. He has had a little bit of runny nose for the last several months, which he attributes to allergies. No sense of postnasal drainage. He does have HIV that is well controlled on medication.  Past Medical History  Diagnosis Date  . HIV (human immunodeficiency virus infection) ([place]) 1999   Past Surgical History  Procedure Laterality Date  . Cholecystectomy  1982  . Tonsillectomy    . Knee surgery      LEFT KNEE   Family History  Problem Relation Age of Onset  . Hypertension Mother   . Heart attack Mother 41   Social History  Substance Use Topics  . Smoking status: Never Smoker   . Smokeless tobacco: Former Systems developer    Types: Chew     Comment: Rare use, about q 6 months  . Alcohol Use: No    Review of Systems As in history of present illness Allergies  Baclofen; Efavirenz; and Nevirapine  Home Medications   Prior to Admission medications   Medication Sig Start Date End Date Taking? Authorizing Provider  Abacavir-Dolutegravir-Lamivud 194-17-408 MG TABS Take 1 tablet by mouth daily. 10/07/14   [name], MD  acetaminophen (TYLENOL) 500 MG tablet Take 1 tablet (500 mg total) by mouth every 6 (six) hours as needed for pain. 07/22/12   [name], MD  aspirin EC 81 MG tablet Take 1 tablet (81 mg total) by mouth daily. 10/11/15   Olin Hauser, DO  benzonatate (TESSALON) 100 MG capsule Take 1 capsule (100 mg total) by mouth 3 (three) times daily as needed for cough. 10/17/15   Melony Overly,  MD  finasteride (PROSCAR) 5 MG tablet Take 5 mg by mouth daily.  11/11/14   Historical Provider, MD  Multiple Vitamins-Minerals (MULTIVITAMIN PO) Take 1 tablet by mouth daily.    Historical Provider, MD  omeprazole (PRILOSEC) 40 MG capsule Take 1 capsule (40 mg total) by mouth daily. 08/23/15   Olin Hauser, DO  predniSONE (DELTASONE) 50 MG tablet Take 1 pill daily for 5 days. 10/17/15   Melony Overly, MD  tamsulosin (FLOMAX) 0.4 MG CAPS capsule TAKE TWO CAPSULES BY MOUTH ONCE DAILY 08/17/15   Olin Hauser, DO   Meds Ordered and Administered this Visit  Medications - No data to display  BP 139/87 mmHg  Pulse 90  Temp(Src) 98.7 F (37.1 C) (Oral)  Resp 12  SpO2 99% No data found.   Physical Exam  Constitutional: He is oriented to person, place, and time. He appears well-developed and well-nourished. No distress.  HENT:  Mouth/Throat: Oropharynx is clear and moist. No oropharyngeal exudate.  No postnasal drainage or cobblestoning.  Neck: Neck supple.  Cardiovascular: Normal rate, regular rhythm and normal heart sounds.   No murmur heard. Pulmonary/Chest: Effort normal and breath sounds normal. No respiratory distress. He has no wheezes. He has no rales.  Lymphadenopathy:    He has no cervical adenopathy.  Neurological: He is alert and oriented to person, place, and time.    ED  Course  Procedures (including critical care time)  Labs Review Labs Reviewed - No data to display  Imaging Review Dg Chest 2 View  10/17/2015  CLINICAL DATA:  Cough. EXAM: CHEST  2 VIEW COMPARISON:  12/07/2014. FINDINGS: Normal sized heart. Clear lungs. Normal appearing bones. Cholecystectomy clips. IMPRESSION: No acute abnormality. Electronically Signed   By: Claudie Revering M.D.   On: 10/17/2015 19:43      MDM   1. Bronchitis    Treat with prednisone and Tessalon. Return precautions reviewed.    Melony Overly, MD 10/17/15 2000

## 2015-10-17 NOTE — ED Notes (Signed)
Cough and aching.  Dry cough and worsening at night.  Head, neck and back soreness.

## 2015-12-07 ENCOUNTER — Ambulatory Visit (HOSPITAL_COMMUNITY)
Admission: EM | Admit: 2015-12-07 | Discharge: 2015-12-07 | Disposition: A | Discharge status: Home / Self Care | Payer: Commercial Managed Care - HMO | Attending: Family Medicine | Admitting: Family Medicine

## 2015-12-07 ENCOUNTER — Encounter (HOSPITAL_COMMUNITY): Payer: Self-pay | Admitting: Emergency Medicine

## 2015-12-07 DIAGNOSIS — M436 Torticollis: Secondary | ICD-10-CM | POA: Diagnosis not present

## 2015-12-07 MED ORDER — CYCLOBENZAPRINE HCL 10 MG PO TABS: 10.0000 mg | ORAL_TABLET | Freq: Two times a day (BID) | ORAL | 0 refills | Status: DC | PRN

## 2015-12-07 NOTE — ED Triage Notes (Addendum)
The patient presented to the [facility] with a complaint of neck pain and stiffness that radiates into his left shoulder and arm. The patient denied any known injury.

## 2015-12-08 NOTE — ED Provider Notes (Signed)
CSN: [id number]     Arrival date & time 12/07/15  1316 History   First MD Initiated Contact with Patient 12/07/15 1439     Chief Complaint  Patient presents with  . Torticollis   (Consider location/radiation/quality/duration/timing/severity/associated sxs/prior Treatment) HPI 70 Y/O MALE AWOKE SEVERAL DAYS AGO WITH LEFT NECK STIFFNESS AND PAIN, NO KNOWN INJURY PAIN SCORE IS 3. HEAT AND TYLENOL AT HOME. NOT [place].  Past Medical History:  Diagnosis Date  . HIV (human immunodeficiency virus infection) ([place]) 1999   Past Surgical History:  Procedure Laterality Date  . CHOLECYSTECTOMY  1982  . KNEE SURGERY     LEFT KNEE  . TONSILLECTOMY     Family History  Problem Relation Age of Onset  . Hypertension Mother   . Heart attack Mother 24   Social History  Substance Use Topics  . Smoking status: Never Smoker  . Smokeless tobacco: Former Systems developer    Types: Chew     Comment: Rare use, about q 6 months  . Alcohol use No    Review of Systems  Denies: HEADACHE, NAUSEA, ABDOMINAL PAIN, CHEST PAIN, CONGESTION, DYSURIA, SHORTNESS OF BREATH  Allergies  Baclofen; Efavirenz; and Nevirapine  Home Medications   Prior to Admission medications   Medication Sig Start Date End Date Taking? Authorizing Provider  aspirin EC 81 MG tablet Take 1 tablet (81 mg total) by mouth daily. 10/11/15  Yes Alexander Devin Going, DO  finasteride (PROSCAR) 5 MG tablet Take 5 mg by mouth daily.  11/11/14  Yes Historical Provider, MD  Multiple Vitamins-Minerals (MULTIVITAMIN PO) Take 1 tablet by mouth daily.   Yes Historical Provider, MD  omeprazole (PRILOSEC) 40 MG capsule Take 1 capsule (40 mg total) by mouth daily. 08/23/15  Yes Alexander Devin Going, DO  tamsulosin (FLOMAX) 0.4 MG CAPS capsule TAKE TWO CAPSULES BY MOUTH ONCE DAILY 08/17/15  Yes Alexander J Karamalegos, DO  TRIUMEQ 600-50-300 MG tablet TAKE ONE TABLET BY MOUTH ONCE DAILY. 10/18/15  Yes [name], MD  acetaminophen (TYLENOL) 500 MG  tablet Take 1 tablet (500 mg total) by mouth every 6 (six) hours as needed for pain. 07/22/12   [name], MD  benzonatate (TESSALON) 100 MG capsule Take 1 capsule (100 mg total) by mouth 3 (three) times daily as needed for cough. 10/17/15   Melony Overly, MD  cyclobenzaprine (FLEXERIL) 10 MG tablet Take 1 tablet (10 mg total) by mouth 2 (two) times daily as needed for muscle spasms. 12/07/15   Konrad Felix, PA  predniSONE (DELTASONE) 50 MG tablet Take 1 pill daily for 5 days. 10/17/15   Melony Overly, MD   Meds Ordered and Administered this Visit  Medications - No data to display  BP 142/79 (BP Location: Left Arm)   Pulse 65   Temp 97.8 F (36.6 C) (Oral)   Resp 12   SpO2 96%  No data found.   Physical Exam NURSES NOTES AND VITAL SIGNS REVIEWED. CONSTITUTIONAL: Well developed, well nourished, no acute distress HEENT: normocephalic, atraumatic EYES: Conjunctiva normal NECK:normal ROM, supple, no adenopathy, THERE IS SOME PULLING TO THE LEFT ON EXAMINATION. NO MIDLINE TENDERNESS, SPASM PALPABLE LEFT TRAPEZIUS. NO MENINGEAL FINDINGS.  PULMONARY:No respiratory distress, normal effort ABDOMINAL: Soft, ND, NT BS+, No CVAT MUSCULOSKELETAL: Normal ROM of all extremities,  SKIN: warm and dry without rash PSYCHIATRIC: Mood and affect, behavior are normal  Urgent Care Course   Clinical Course    Procedures (including critical care time)  Labs Review Labs  Reviewed - No data to display  Imaging Review No results found.   Visual Acuity Review  Right Eye Distance:   Left Eye Distance:   Bilateral Distance:    Right Eye Near:   Left Eye Near:    Bilateral Near:        FLEXERIL AND SYMPTOMATIC TREATMENT. NO SUGGESTION OF ANY INFECTIOUS ILLNESS. MDM   1. Torticollis, acute     Patient is reassured that there are no issues that require transfer to higher level of care at this time or additional tests. Patient is advised to continue home symptomatic treatment. Patient is  advised that if there are new or worsening symptoms to attend the emergency department, contact primary care provider, or return to UC. Instructions of care provided discharged home in stable condition.    THIS NOTE WAS GENERATED USING A VOICE RECOGNITION SOFTWARE PROGRAM. ALL REASONABLE EFFORTS  WERE MADE TO PROOFREAD THIS DOCUMENT FOR ACCURACY.  I have verbally reviewed the discharge instructions with the patient. A printed AVS was given to the patient.  All questions were answered prior to discharge.      Konrad Felix, [place] 12/08/15 1925

## 2015-12-13 ENCOUNTER — Telehealth: Payer: Self-pay | Admitting: Internal Medicine

## 2015-12-13 NOTE — Telephone Encounter (Signed)
Pt was told by his insurance [facility]) that he needs a referral for Dr. Megan Salon (infectious disease clinic) and urology, for his prostate Dr.Dahlstedt. Please advise. Thanks! ep

## 2015-12-20 NOTE — Telephone Encounter (Signed)
[name] referrals obtained, auth #'s [id number] (Dr. Megan Salon) and [id number] (Dr. Diona Fanti).

## 2015-12-21 ENCOUNTER — Ambulatory Visit (INDEPENDENT_AMBULATORY_CARE_PROVIDER_SITE_OTHER): Payer: Commercial Managed Care - HMO | Admitting: Internal Medicine

## 2015-12-21 ENCOUNTER — Encounter: Payer: Self-pay | Admitting: Internal Medicine

## 2015-12-21 VITALS — BP 125/86 | HR 80 | Temp 98.0°F | Wt 156.0 lb

## 2015-12-21 DIAGNOSIS — Z0000 Encounter for general adult medical examination without abnormal findings: Secondary | ICD-10-CM

## 2015-12-21 NOTE — Patient Instructions (Addendum)
It was nice meeting you today Mr. Jeffrey [name]!  You seem to be doing very well today! We have not made any changes in your medications, so please continue to take your medications as you have been.   Please be sure to follow-up with Dr. Megan Salon as you have been, and to go to your urology appointment this month.   We will see you back in one year for your next check-up.   If you have any questions or concerns, please feel free to call the clinic.   Be well,  Dr. Avon Gully

## 2015-12-21 NOTE — Progress Notes (Signed)
70 y.o. year old male presents for well male/preventative visit.  Acute Concerns: L foot pain Patient reports pain in his L heel that sometimes spreads to his toes. Pain isn't present every day, and does not occur at a specific time of day. He broke the affected foot two years ago, but is unsure if this is contributing. He also has a history of plantar fasciitis, but cannot remember which foot was affected. He thinks this pain feels similar to that of plantar fasciitis. He does not want to begin any medications for this, and would prefer to wait to see if the situation improves.   Diet: Decreased appetite recently, but is forcing himself to eat three meals a day. Small breakfast daily, usually consisting of toast and a banana. Eats lunch daily and cooks dinner for himself at night. Doesn't eat fast food and doesn't eat out a lot.   Exercise: A lot of yardwoork, walks a mile nightly. Wants to start going to the gym again.   Sexual History: Intermittent sexual activity. Uses protection (condoms) every time. No concerns about STDs. Does have ED but is working with his urologist on this. HIV is well-controlled.    Social:  Social History   Social History  . Marital status: Divorced    Spouse name: N/A  . Number of children: N/A  . Years of education: N/A   Occupational History  . retired Advertising copywriter   Social History Main Topics  . Smoking status: Never Smoker  . Smokeless tobacco: Former Systems developer    Types: Chew     Comment: Rare use, about q 6 months  . Alcohol use No  . Drug use: No  . Sexual activity: Yes    Partners: Female     Comment: declined condoms   Other Topics Concern  . None   Social History Narrative  . None    Immunization: Immunization History  Administered Date(s) Administered  . H1N1 09/22/2008  . Hepatitis B 09/24/2001, 11/07/2001, 05/26/2002  . Influenza Whole 02/20/2006, 03/21/2007, 03/31/2008, 03/08/2009, 01/26/2010, 12/28/2010  . Influenza,inj,Quad  PF,36+ Mos 04/10/2013, 01/27/2014, 01/21/2015  . Pneumococcal Conjugate-13 01/06/2015  . Pneumococcal Polysaccharide-23 09/22/2008, 08/07/2013  . Tdap 01/06/2015    Cancer Screening:  Colonoscopy: 08/04/2015  Prostate Exam: Has appt with urology (Dr. Beatrix Fetters) in two weeks for BPH/ED  Physical Exam: VITALS: Reviewed GEN: Pleasant male, NAD; well-nourished HEENT: Normocephalic, PERRL, EOMI, no scleral icterus, bilateral TM pearly grey, nasal septum midline, MMM, uvula midline, no anterior or posterior lymphadenopathy, no thyromegaly CARDIAC:RRR, S1 and S2 present, no murmur, no heaves/thrills RESP: CTAB, normal effort ABD: soft, no tenderness, normal bowel sounds EXT: No edema, 2+ radial and DP pulses SKIN: no rash  ASSESSMENT & PLAN: 70 y.o. male presents for annual well male/preventative exam. Please see problem specific assessment and plan.   HIV - Followed by Dr. Megan Salon with ID every 6 months - Viral load undetectable - Continue Triumeq  BPH - Appt with Dr. Beatrix Fetters (urology) in two weeks - Continue tamsulosin and finasteride  Adin Hector, MD, MPH PGY-2 [facility] Pager [phone]

## 2016-01-06 ENCOUNTER — Other Ambulatory Visit: Payer: Commercial Managed Care - HMO

## 2016-01-06 DIAGNOSIS — B20 Human immunodeficiency virus [HIV] disease: Secondary | ICD-10-CM

## 2016-01-06 DIAGNOSIS — E785 Hyperlipidemia, unspecified: Secondary | ICD-10-CM | POA: Diagnosis not present

## 2016-01-06 DIAGNOSIS — Z79899 Other long term (current) drug therapy: Secondary | ICD-10-CM | POA: Diagnosis not present

## 2016-01-06 LAB — LIPID PANEL
CHOL/HDL RATIO: 4.1 ratio (ref <=5.0)
Cholesterol: 207 mg/dL — ABNORMAL HIGH (ref 125–200)
HDL: 50 mg/dL (ref >=40)
LDL Cholesterol: 123 mg/dL (ref <130)
TRIGLYCERIDES: 169 mg/dL — AB (ref <150)
VLDL: 34 mg/dL — ABNORMAL HIGH (ref <30)

## 2016-01-06 LAB — CBC
HEMATOCRIT: 42.2 % (ref 38.5–50.0)
Hemoglobin: 14.4 g/dL (ref 13.2–17.1)
MCH: 34.4 pg — ABNORMAL HIGH (ref 27.0–33.0)
MCHC: 34.1 g/dL (ref 32.0–36.0)
MCV: 101 fL — AB (ref 80.0–100.0)
MPV: 8.5 fL (ref 7.5–12.5)
Platelets: 171 10*3/uL (ref 140–400)
RBC: 4.18 MIL/uL — ABNORMAL LOW (ref 4.20–5.80)
RDW: 13.5 % (ref 11.0–15.0)
WBC: 4.5 10*3/uL (ref 3.8–10.8)

## 2016-01-06 LAB — COMPREHENSIVE METABOLIC PANEL
ALK PHOS: 66 U/L (ref 40–115)
ALT: 18 U/L (ref 9–46)
AST: 17 U/L (ref 10–35)
Albumin: 3.7 g/dL (ref 3.6–5.1)
BUN: 32 mg/dL — ABNORMAL HIGH (ref 7–25)
CO2: 26 mmol/L (ref 20–31)
Calcium: 9.1 mg/dL (ref 8.6–10.3)
Chloride: 108 mmol/L (ref 98–110)
Creat: 1.59 mg/dL — ABNORMAL HIGH (ref 0.70–1.25)
GLUCOSE: 85 mg/dL (ref 65–99)
POTASSIUM: 4 mmol/L (ref 3.5–5.3)
Sodium: 143 mmol/L (ref 135–146)
Total Bilirubin: 0.4 mg/dL (ref 0.2–1.2)
Total Protein: 6.2 g/dL (ref 6.1–8.1)

## 2016-01-07 LAB — T-HELPER CELL (CD4) - (RCID CLINIC ONLY)
CD4 T CELL ABS: 460 /uL (ref 400–2700)
CD4 T CELL HELPER: 29 % — AB (ref 33–55)

## 2016-01-07 LAB — HIV-1 RNA QUANT-NO REFLEX-BLD
HIV 1 RNA Quant: <20 copies/mL (ref <20)
HIV-1 RNA Quant, Log: <1.3 Log copies/mL (ref <1.30)

## 2016-01-07 LAB — RPR

## 2016-01-20 ENCOUNTER — Ambulatory Visit (INDEPENDENT_AMBULATORY_CARE_PROVIDER_SITE_OTHER): Payer: Commercial Managed Care - HMO | Admitting: Internal Medicine

## 2016-01-20 ENCOUNTER — Encounter: Payer: Self-pay | Admitting: Internal Medicine

## 2016-01-20 DIAGNOSIS — B20 Human immunodeficiency virus [HIV] disease: Secondary | ICD-10-CM

## 2016-01-20 DIAGNOSIS — Z23 Encounter for immunization: Secondary | ICD-10-CM

## 2016-01-20 NOTE — Assessment & Plan Note (Signed)
His infection remains under excellent, long-term control and his CD4 count has reconstituted to normal. He will continue Triumeq and follow-up after lab work in 6 months.

## 2016-01-20 NOTE — Progress Notes (Signed)
Patient Active Problem List   Diagnosis Date Noted  . Human immunodeficiency virus (HIV) disease ([place]) 02/27/2006    Priority: High  . Left ankle pain 08/07/2013    Priority: Medium  . Dyslipidemia 07/22/2012    Priority: Medium  . Acute-on-chronic kidney injury ([place]) 07/22/2012    Priority: Medium  . Erectile dysfunction 07/22/2012    Priority: Medium  . GERD 05/17/2010    Priority: Medium  . Dysphagia 03/08/2009    Priority: Medium  . Essential hypertension 02/27/2006    Priority: Medium  . Epistaxis 10/11/2015  . Seborrheic dermatitis of scalp 01/06/2015  . Diastolic CHF with preserved left ventricular function, NYHA class 2 ([place]) 01/06/2015  . Prostatitis 10/15/2014  . Muscle spasm of back 10/15/2014  . Exertional dyspnea 09/30/2014  . Insomnia 07/22/2014  . BPH with obstruction/lower urinary tract symptoms 06/17/2014  . Screening for colon cancer 10/07/2013  . CHEST PAIN 05/17/2010  . CERVICAL LYMPHADENOPATHY 09/19/2007  . CUTANEOUS ERUPTIONS, DRUG-INDUCED 05/28/2006  . CHOLECYSTECTOMY, HX OF 05/28/2006  . ESOPHAGITIS 02/27/2006  . ABSCESS, NECK 02/27/2006  . PNEUMONIA, HX OF 02/27/2006  . ARTHROSCOPY, KNEE, HX OF 02/27/2006    Patient's Medications  New Prescriptions   No medications on file  Previous Medications   ASPIRIN EC 81 MG TABLET    Take 1 tablet (81 mg total) by mouth daily.   FINASTERIDE (PROSCAR) 5 MG TABLET    Take 5 mg by mouth daily.    MULTIPLE VITAMINS-MINERALS (MULTIVITAMIN PO)    Take 1 tablet by mouth daily.   OMEPRAZOLE (PRILOSEC) 40 MG CAPSULE    Take 1 capsule (40 mg total) by mouth daily.   TAMSULOSIN (FLOMAX) 0.4 MG CAPS CAPSULE    TAKE TWO CAPSULES BY MOUTH ONCE DAILY   TRIUMEQ 600-50-300 MG TABLET    TAKE ONE TABLET BY MOUTH ONCE DAILY.  Modified Medications   No medications on file  Discontinued Medications   No medications on file    Subjective: Jeffrey [name] is in for his routine follow-up visit. He has had no problems  obtaining, taking his Triumeq. He does not believe he is missed any doses. He believes he developed a cold recently. He has had some scratchy, sore throat and sinus congestion for the past 3 days. He has not had any fever, chills, sweats. There is no change in his chronic nonproductive cough.   Review of Systems: Review of Systems  Constitutional: Negative for chills, diaphoresis, fever, malaise/fatigue and weight loss.  HENT: Positive for congestion and sore throat.   Respiratory: Negative for cough, sputum production and shortness of breath.   Cardiovascular: Negative for chest pain.  Gastrointestinal: Negative for abdominal pain, diarrhea, nausea and vomiting.  Genitourinary: Negative for dysuria.  Musculoskeletal: Negative for joint pain and myalgias.  Skin: Negative for rash.  Neurological: Negative for headaches.  Psychiatric/Behavioral: Negative for depression and substance abuse. The patient is not nervous/anxious.     Past Medical History:  Diagnosis Date  . HIV (human immunodeficiency virus infection) ([place]) 1999    Social History  Substance Use Topics  . Smoking status: Never Smoker  . Smokeless tobacco: Former Systems developer    Types: Chew     Comment: Rare use, about q 6 months  . Alcohol use No    Family History  Problem Relation Age of Onset  . Hypertension Mother   . Heart attack Mother 72    Allergies  Allergen Reactions  . Baclofen  Back pain. Patient said it made him feel crazy  . Efavirenz     Rash  . Nevirapine     Rash    Objective:  Vitals:   01/20/16 0930 01/20/16 0933  BP:  130/86  Pulse:  74  Temp:  98.4 F (36.9 C)  Weight: 161 lb (73 kg)   Height: 5' 10"  (1.778 m)    Body mass index is 23.1 kg/m.  Physical Exam  Constitutional: He is oriented to person, place, and time.  HENT:  Mouth/Throat: No oropharyngeal exudate.  Eyes: Conjunctivae are normal.  Cardiovascular: Normal rate and regular rhythm.   No murmur  heard. Pulmonary/Chest: Effort normal and breath sounds normal. He has no wheezes. He has no rales.  Abdominal: Soft. He exhibits no mass. There is no tenderness.  Musculoskeletal: Normal range of motion. He exhibits no edema or tenderness.  Neurological: He is alert and oriented to person, place, and time.  Skin: No rash noted.  Psychiatric: Mood and affect normal.    Lab Results Lab Results  Component Value Date   WBC 4.5 01/06/2016   HGB 14.4 01/06/2016   HCT 42.2 01/06/2016   MCV 101.0 (H) 01/06/2016   PLT 171 01/06/2016    Lab Results  Component Value Date   CREATININE 1.59 (H) 01/06/2016   BUN 32 (H) 01/06/2016   NA 143 01/06/2016   K 4.0 01/06/2016   CL 108 01/06/2016   CO2 26 01/06/2016    Lab Results  Component Value Date   ALT 18 01/06/2016   AST 17 01/06/2016   ALKPHOS 66 01/06/2016   BILITOT 0.4 01/06/2016    Lab Results  Component Value Date   CHOL 207 (H) 01/06/2016   HDL 50 01/06/2016   LDLCALC 123 01/06/2016   TRIG 169 (H) 01/06/2016   CHOLHDL 4.1 01/06/2016   HIV 1 RNA Quant (copies/mL)  Date Value  01/06/2016 <20  06/29/2015 <20  01/07/2015 <20   CD4 T Cell Abs (/uL)  Date Value  01/06/2016 460  06/29/2015 320 (L)  01/07/2015 360 (L)     Problem List Items Addressed This Visit      High   Human immunodeficiency virus (HIV) disease ([place])    His infection remains under excellent, long-term control and his CD4 count has reconstituted to normal. He will continue Triumeq and follow-up after lab work in 6 months.      Relevant Orders   T-helper cell (CD4)- ([facility] clinic only)   HIV 1 RNA quant-no reflex-bld   CBC   Comprehensive metabolic panel   Lipid panel   RPR    Other Visit Diagnoses   None.       [name], MD [facility] [place] [facility] [phone] pager   [phone] cell 01/20/2016, 9:50 AM

## 2016-02-02 DIAGNOSIS — N401 Enlarged prostate with lower urinary tract symptoms: Secondary | ICD-10-CM | POA: Diagnosis not present

## 2016-02-02 DIAGNOSIS — N5201 Erectile dysfunction due to arterial insufficiency: Secondary | ICD-10-CM | POA: Diagnosis not present

## 2016-02-02 DIAGNOSIS — R311 Benign essential microscopic hematuria: Secondary | ICD-10-CM | POA: Diagnosis not present

## 2016-02-02 DIAGNOSIS — R350 Frequency of micturition: Secondary | ICD-10-CM | POA: Diagnosis not present

## 2016-02-14 ENCOUNTER — Other Ambulatory Visit: Payer: Self-pay | Admitting: Family Medicine

## 2016-02-14 DIAGNOSIS — N401 Enlarged prostate with lower urinary tract symptoms: Principal | ICD-10-CM

## 2016-02-14 DIAGNOSIS — N138 Other obstructive and reflux uropathy: Secondary | ICD-10-CM

## 2016-02-18 ENCOUNTER — Other Ambulatory Visit: Payer: Self-pay | Admitting: Family Medicine

## 2016-02-18 DIAGNOSIS — N401 Enlarged prostate with lower urinary tract symptoms: Principal | ICD-10-CM

## 2016-02-18 DIAGNOSIS — N138 Other obstructive and reflux uropathy: Secondary | ICD-10-CM

## 2016-02-22 NOTE — Telephone Encounter (Signed)
2nd request.  Martin, Tamika L, RN  

## 2016-03-06 ENCOUNTER — Ambulatory Visit: Payer: Commercial Managed Care - HMO | Admitting: Internal Medicine

## 2016-03-08 ENCOUNTER — Ambulatory Visit: Payer: Commercial Managed Care - HMO | Admitting: Internal Medicine

## 2016-03-17 ENCOUNTER — Ambulatory Visit (INDEPENDENT_AMBULATORY_CARE_PROVIDER_SITE_OTHER): Payer: Commercial Managed Care - HMO | Admitting: Internal Medicine

## 2016-03-17 ENCOUNTER — Encounter: Payer: Self-pay | Admitting: Internal Medicine

## 2016-03-17 VITALS — BP 135/83 | HR 87 | Temp 97.9°F | Wt 162.8 lb

## 2016-03-17 DIAGNOSIS — R040 Epistaxis: Secondary | ICD-10-CM | POA: Diagnosis not present

## 2016-03-17 LAB — PROTIME-INR
INR: 1
PROTHROMBIN TIME: 10.3 s (ref 9.0–11.5)

## 2016-03-17 LAB — POCT HEMOGLOBIN: HEMOGLOBIN: 14.4 g/dL (ref 14.1–18.1)

## 2016-03-17 NOTE — Patient Instructions (Signed)
It was nice seeing you again today Mr. Spiewak!  For your nosebleeds, you can try using over the counter nasal saline every day to keep your nose moist. You can also put Vaseline on a Qtip and place this in your nose if you prefer.   I have placed a referral to an ENT (Ear, Nose, and Throat) specialist for you. They will call you with the date and time of this appointment.   If you have bleeding that does not stop after 30 minutes of pressure, please go to Urgent Care or the emergency room.   If you have any questions or concerns, please feel free to call the clinic.   Be well,  Dr. Avon Gully

## 2016-03-17 NOTE — Assessment & Plan Note (Signed)
As began after colonoscopy, possibly related to microtrauma sustained during procedure. No concerns for significant blood loss, as patient denying lightheadedness or dizziness and as bleeding resolves within about 10 minutes. No bleeding elsewhere suggestive of clotting disorder. No active bleeding today, and no masses or other abnormalities noted on visual exam. As issue has been ongoing for 8 months, will refer to ENT for further work-up (possible scope to identify any obvious sources of bleeding).  - PT/INR and POC Hgb today to assess for clotting disorders and anemia - Referral to ENT - Suggested trying nasal saline in the meantime to keep nares moist - Instructed to go to ED if bleeding persists after 30 min of pressure

## 2016-03-17 NOTE — Progress Notes (Signed)
   Subjective:    Patient ID: Jeffrey [name], male    DOB: 04-13-1946, 70 y.o.   MRN: [id number]  HPI  Patient presents for recurrent nose bleeds.   Recurrent nosebleeds Began 8 months ago after colonoscopy. Only occurs on the R. Occurs at least once a week, but up to four times a week. Does not happen at any particular time of day. Sometimes occurs at night as well. Typically lasts 10-15 minutes and resolves with pressure. Usually with a small clot before resolving, but never with large clots. Not painful. Denies dizziness or lightheadedness. Denies bleeding elsewhere. Has never and does not currently use any nasal sprays. No illicit drug use.   Smoking status reviewed. Patient is former smokeless tobacco user.    Review of Systems See HPI.     Objective:   Physical Exam  Constitutional: He is oriented to person, place, and time. He appears well-developed and well-nourished. No distress.  HENT:  Head: Normocephalic and atraumatic.  Nose: Nose normal. No mucosal edema, rhinorrhea, nose lacerations, sinus tenderness or septal deviation. No epistaxis.  No foreign bodies. Right sinus exhibits no maxillary sinus tenderness and no frontal sinus tenderness. Left sinus exhibits no maxillary sinus tenderness and no frontal sinus tenderness.  Eyes: Conjunctivae and EOM are normal. Right eye exhibits no discharge. Left eye exhibits no discharge.  Pulmonary/Chest: Effort normal. No respiratory distress.  Neurological: He is alert and oriented to person, place, and time.  Skin: Skin is warm and dry.  Psychiatric: He has a normal mood and affect. His behavior is normal.      Assessment & Plan:  Epistaxis As began after colonoscopy, possibly related to microtrauma sustained during procedure. No concerns for significant blood loss, as patient denying lightheadedness or dizziness and as bleeding resolves within about 10 minutes. No bleeding elsewhere suggestive of clotting disorder. No active bleeding  today, and no masses or other abnormalities noted on visual exam. As issue has been ongoing for 8 months, will refer to ENT for further work-up (possible scope to identify any obvious sources of bleeding).  - PT/INR and POC Hgb today to assess for clotting disorders and anemia - Referral to ENT - Suggested trying nasal saline in the meantime to keep nares moist - Instructed to go to ED if bleeding persists after 30 min of pressure  Adin Hector, MD, MPH PGY-2 [place] Pager [phone]

## 2016-04-10 DIAGNOSIS — R040 Epistaxis: Secondary | ICD-10-CM | POA: Diagnosis not present

## 2016-04-10 DIAGNOSIS — B20 Human immunodeficiency virus [HIV] disease: Secondary | ICD-10-CM | POA: Diagnosis not present

## 2016-04-10 DIAGNOSIS — F17220 Nicotine dependence, chewing tobacco, uncomplicated: Secondary | ICD-10-CM | POA: Diagnosis not present

## 2016-04-17 ENCOUNTER — Other Ambulatory Visit: Payer: Self-pay | Admitting: Internal Medicine

## 2016-04-17 DIAGNOSIS — B20 Human immunodeficiency virus [HIV] disease: Secondary | ICD-10-CM

## 2016-04-24 ENCOUNTER — Other Ambulatory Visit: Payer: Self-pay | Admitting: Family Medicine

## 2016-04-24 DIAGNOSIS — K219 Gastro-esophageal reflux disease without esophagitis: Secondary | ICD-10-CM

## 2016-04-25 ENCOUNTER — Other Ambulatory Visit: Payer: Self-pay | Admitting: *Deleted

## 2016-04-25 DIAGNOSIS — K219 Gastro-esophageal reflux disease without esophagitis: Secondary | ICD-10-CM

## 2016-04-25 MED ORDER — OMEPRAZOLE 40 MG PO CPDR: 40.0000 mg | DELAYED_RELEASE_CAPSULE | Freq: Every day | ORAL | 2 refills | Status: DC

## 2016-05-17 ENCOUNTER — Ambulatory Visit (INDEPENDENT_AMBULATORY_CARE_PROVIDER_SITE_OTHER): Payer: Medicare HMO | Admitting: Internal Medicine

## 2016-05-17 ENCOUNTER — Encounter: Payer: Self-pay | Admitting: Internal Medicine

## 2016-05-17 DIAGNOSIS — R05 Cough: Secondary | ICD-10-CM | POA: Diagnosis not present

## 2016-05-17 DIAGNOSIS — R059 Cough, unspecified: Secondary | ICD-10-CM

## 2016-05-17 MED ORDER — GUAIFENESIN-CODEINE 100-10 MG/5ML PO SYRP: 5.0000 mL | ORAL_SOLUTION | Freq: Three times a day (TID) | ORAL | 0 refills | Status: DC | PRN

## 2016-05-17 NOTE — Assessment & Plan Note (Signed)
Suspect related to a viral process. Lungs are clear, patient is afebrile, and without hypoxia so low suspicion for PNA. Patient is HIV+. Reports good compliance with medications and most recent CD4 count 460 with HIV RNA <20 demonstrating good control of the virus. Patient has follow up with ID in March for repeat labs.  -Rx for Guaifenesin-Codeine given  -recommended Mucinex and anti-histamine  -return precautions for secondary bacterial infection discussed

## 2016-05-17 NOTE — Progress Notes (Signed)
   Subjective:    Jeffrey [name] - 71 y.o. male MRN [id number]  Date of birth: 10-Oct-1945  HPI  Jeffrey [name] is here for cough.  Cough: Started 5 days ago. Was initially productive but has since become drier. Cough has been improving. Reports fevers the night the cough started. Endorses nasal congestion. Reports some bilateral chest soreness that goes from upper abdomen to upper ribs. Denies sick contacts. Has been taking Robitussin without relief. No nausea or vomiting. Denies LE edema. Patient has a history of HIV and reports compliance with his medications.   -  reports that he has never smoked. He has quit using smokeless tobacco. His smokeless tobacco use included Chew. - Review of Systems: Per HPI. - Past Medical History: Patient Active Problem List   Diagnosis Date Noted  . Cough 05/17/2016  . Epistaxis 10/11/2015  . Seborrheic dermatitis of scalp 01/06/2015  . Diastolic CHF with preserved left ventricular function, NYHA class 2 ([place]) 01/06/2015  . Prostatitis 10/15/2014  . Muscle spasm of back 10/15/2014  . Exertional dyspnea 09/30/2014  . Insomnia 07/22/2014  . BPH with obstruction/lower urinary tract symptoms 06/17/2014  . Screening for colon cancer 10/07/2013  . Left ankle pain 08/07/2013  . Dyslipidemia 07/22/2012  . Acute-on-chronic kidney injury ([place]) 07/22/2012  . Erectile dysfunction 07/22/2012  . GERD 05/17/2010  . CHEST PAIN 05/17/2010  . Dysphagia 03/08/2009  . CERVICAL LYMPHADENOPATHY 09/19/2007  . CUTANEOUS ERUPTIONS, DRUG-INDUCED 05/28/2006  . CHOLECYSTECTOMY, HX OF 05/28/2006  . Human immunodeficiency virus (HIV) disease ([place]) 02/27/2006  . Essential hypertension 02/27/2006  . ESOPHAGITIS 02/27/2006  . ABSCESS, NECK 02/27/2006  . PNEUMONIA, HX OF 02/27/2006  . ARTHROSCOPY, KNEE, HX OF 02/27/2006   - Medications: reviewed and updated   Objective:   Physical Exam BP 102/70   Pulse 98   Temp 98.1 F (36.7 C) (Oral)   Ht 5' 10"  (1.778 m)   Wt 162  lb (73.5 kg)   SpO2 95%   BMI 23.24 kg/m  Gen: NAD, alert, cooperative with exam HEENT: NCAT, PERRL, clear conjunctiva, oropharynx clear, supple neck CV: RRR, good S1/S2, no murmur, no edema, capillary refill brisk, soreness to palpation of anterior chest over ribs bilaterally   Resp: CTABL, no wheezes, non-labored, non-productive cough throughout exam  Abd: SNTND, BS present, no guarding or organomegaly    Assessment & Plan:   Cough Suspect related to a viral process. Lungs are clear, patient is afebrile, and without hypoxia so low suspicion for PNA. Patient is HIV+. Reports good compliance with medications and most recent CD4 count 460 with HIV RNA <20 demonstrating good control of the virus. Patient has follow up with ID in March for repeat labs.  -Rx for Guaifenesin-Codeine given  -recommended Mucinex and anti-histamine  -return precautions for secondary bacterial infection discussed     Phill Myron, D.O. 05/17/2016, 9:39 AM PGY-2, [place]

## 2016-05-17 NOTE — Patient Instructions (Signed)
I suspect you have a virus causing your cough. I have prescribed prescription strength cough syrup. You can also try Mucinex and anti-histamines to help thin and dry up your mucus.   If you start having increased shortness of breath, worsening of your fevers, etc please let us know as these could be signs of a secondary bacterial infection.

## 2016-06-01 ENCOUNTER — Telehealth: Payer: Self-pay | Admitting: Internal Medicine

## 2016-06-01 NOTE — Telephone Encounter (Signed)
Pt needs another referral for ENT, pt is still having nosebleeds and Dr. Constance Holster (sp) told him to come back if pt was still having problems. ep

## 2016-06-02 ENCOUNTER — Other Ambulatory Visit: Payer: Self-pay | Admitting: Internal Medicine

## 2016-06-02 DIAGNOSIS — R040 Epistaxis: Secondary | ICD-10-CM

## 2016-06-13 DIAGNOSIS — R040 Epistaxis: Secondary | ICD-10-CM | POA: Diagnosis not present

## 2016-07-03 ENCOUNTER — Ambulatory Visit (INDEPENDENT_AMBULATORY_CARE_PROVIDER_SITE_OTHER): Payer: Medicare HMO | Admitting: Internal Medicine

## 2016-07-03 ENCOUNTER — Encounter: Payer: Self-pay | Admitting: Internal Medicine

## 2016-07-03 DIAGNOSIS — M25511 Pain in right shoulder: Secondary | ICD-10-CM | POA: Diagnosis not present

## 2016-07-03 NOTE — Progress Notes (Signed)
   Subjective:   Patient: Jeffrey [name]       Birthdate: 1945/11/24       MRN: [id number]      HPI  Jeffrey [name] is a 71 y.o. male presenting for R shoulder pain.   R shoulder pain Began 6-8 weeks ago. Patient cannot recall recent trauma or inciting event. Pain initially intermittent with certain movements, but now occurring with any movements, which is why he sought care today. Describes pain as constant but occasionally sharp. Pain is worst when sleeping, including sleeping on his back and not directly on the affected shoulder, when driving, eating, and when stirring while baking. He has never had similar pain before. He has been taking two extra strength Tylenol in the morning and the evening but has not tried anything else for pain. Denies fevers, chills. Denies redness or swelling of his shoulder of arm. He is R-hand dominant.   Smoking status reviewed. Patient is former chewing tobacco user.   Review of Systems See HPI.     Objective:  Physical Exam  Constitutional: He is oriented to person, place, and time and well-developed, well-nourished, and in no distress.  HENT:  Head: Normocephalic and atraumatic.  Eyes: Conjunctivae and EOM are normal. Right eye exhibits no discharge. Left eye exhibits no discharge.  Pulmonary/Chest: Effort normal. No respiratory distress.  Musculoskeletal:  Positive lift-off test on R. Negative empty can. 5/5 strength bilaterally but grimaces with resistance on R. Full ROM but reports pain with most movements. TTP of R pec and R subscapularis. No bony tenderness noted.   Neurological: He is alert and oriented to person, place, and time. Gait normal.  Skin: Skin is warm and dry.  Psychiatric: Affect and judgment normal.    Assessment & Plan:  Right shoulder pain Possibly subacromial bursitis, though location of TTP not entirely consistent with diagnosis. Full ROM and strength though pain with motion and resistance. Patient also evaluated by Dr. Nori Riis,  who will see him in sports med clinic to evaluate further with ultrasound.  - Appt scheduled with Dr. Nori Riis in sports med clinic on 03/02 - Can continue to take Tylenol or ibuprofen in the meantime for symptomatic relief   Adin Hector, MD, MPH PGY-2 [facility] Pager [phone]

## 2016-07-03 NOTE — Assessment & Plan Note (Signed)
Possibly subacromial bursitis, though location of TTP not entirely consistent with diagnosis. Full ROM and strength though pain with motion and resistance. Patient also evaluated by Dr. Nori Riis, who will see him in sports med clinic to evaluate further with ultrasound.  - Appt scheduled with Dr. Nori Riis in sports med clinic on 03/02 - Can continue to take Tylenol or ibuprofen in the meantime for symptomatic relief

## 2016-07-03 NOTE — Patient Instructions (Addendum)
It was nice seeing you again today Mr. Jeffrey [name]!  Dr. Nori Riis will see you at the sports medicine clinic on Friday (March 2) at 11 AM to perform an ultrasound on your shoulder. Their information is below: [facility] [phone]  In the meantime, you can continue to take Tylenol or ibuprofen to help with the pain. You can also try using a heating pad or ice pack to see if this eases your pain at all.   If you have any questions or concerns, please feel free to call the clinic.   Be well,  Dr. Avon Gully

## 2016-07-06 ENCOUNTER — Other Ambulatory Visit: Payer: Medicare HMO

## 2016-07-06 DIAGNOSIS — Z79899 Other long term (current) drug therapy: Secondary | ICD-10-CM | POA: Diagnosis not present

## 2016-07-06 DIAGNOSIS — B20 Human immunodeficiency virus [HIV] disease: Secondary | ICD-10-CM | POA: Diagnosis not present

## 2016-07-06 LAB — CBC
HEMATOCRIT: 38.1 % — AB (ref 38.5–50.0)
Hemoglobin: 13.1 g/dL — ABNORMAL LOW (ref 13.2–17.1)
MCH: 34.3 pg — AB (ref 27.0–33.0)
MCHC: 34.4 g/dL (ref 32.0–36.0)
MCV: 99.7 fL (ref 80.0–100.0)
MPV: 8.8 fL (ref 7.5–12.5)
PLATELETS: 167 10*3/uL (ref 140–400)
RBC: 3.82 MIL/uL — ABNORMAL LOW (ref 4.20–5.80)
RDW: 14.4 % (ref 11.0–15.0)
WBC: 4 10*3/uL (ref 3.8–10.8)

## 2016-07-06 LAB — COMPREHENSIVE METABOLIC PANEL
ALT: 17 U/L (ref 9–46)
AST: 19 U/L (ref 10–35)
Albumin: 3.6 g/dL (ref 3.6–5.1)
Alkaline Phosphatase: 71 U/L (ref 40–115)
BUN: 22 mg/dL (ref 7–25)
CALCIUM: 9 mg/dL (ref 8.6–10.3)
CHLORIDE: 108 mmol/L (ref 98–110)
CO2: 29 mmol/L (ref 20–31)
Creat: 1.54 mg/dL — ABNORMAL HIGH (ref 0.70–1.25)
GLUCOSE: 75 mg/dL (ref 65–99)
POTASSIUM: 4.2 mmol/L (ref 3.5–5.3)
Sodium: 141 mmol/L (ref 135–146)
Total Bilirubin: 0.3 mg/dL (ref 0.2–1.2)
Total Protein: 6 g/dL — ABNORMAL LOW (ref 6.1–8.1)

## 2016-07-06 LAB — LIPID PANEL
CHOL/HDL RATIO: 3.6 ratio (ref <5.0)
Cholesterol: 190 mg/dL (ref <200)
HDL: 53 mg/dL (ref >40)
LDL CALC: 113 mg/dL — AB (ref <100)
Triglycerides: 122 mg/dL (ref <150)
VLDL: 24 mg/dL (ref <30)

## 2016-07-07 ENCOUNTER — Ambulatory Visit (INDEPENDENT_AMBULATORY_CARE_PROVIDER_SITE_OTHER): Payer: Medicare HMO | Admitting: Family Medicine

## 2016-07-07 ENCOUNTER — Encounter: Payer: Self-pay | Admitting: Family Medicine

## 2016-07-07 VITALS — BP 144/80 | Ht 70.0 in | Wt 161.0 lb

## 2016-07-07 DIAGNOSIS — M25511 Pain in right shoulder: Secondary | ICD-10-CM | POA: Diagnosis not present

## 2016-07-07 LAB — T-HELPER CELL (CD4) - (RCID CLINIC ONLY)
CD4 T CELL HELPER: 31 % — AB (ref 33–55)
CD4 T Cell Abs: 430 /uL (ref 400–2700)

## 2016-07-07 LAB — RPR

## 2016-07-07 MED ORDER — METHYLPREDNISOLONE ACETATE 40 MG/ML IJ SUSP
40.0000 mg | Freq: Once | INTRAMUSCULAR | Status: AC
  Administered 2016-07-07: 40 mg via INTRA_ARTICULAR

## 2016-07-07 NOTE — Progress Notes (Signed)
    CHIEF COMPLAINT / HPI:   Right shoulder pain for the last 6 weeks. No specific injury. Finds it difficult to do most activities with this shoulder on the right. Also problems with sleeping at night as he cannot get comfortable. Never had issues with this before. Right-hand dominant. He is retired. 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  REVIEW OF SYSTEMS:  No unusual weight change. He's had no numbness in his upper extremities. He's noted no rash of the right upper extremity. He's had no fever.  PERTINENT  PMH / PSH: I have reviewed the patient's medications, allergies, past medical and surgical history, smoking status.  Pertinent findings that relate to today's visit / issues include: HIV disease Hypertension Diastolic CHF with preserved ventricular function No personal history of diabetes mellitus Never smoker, occasional use of chewing tobacco Knee arthroscopy  INJECTION: Patient was given informed consent, signed copy in the chart. Appropriate time out was taken. Area prepped and draped in usual sterile fashion. 1 cc of methylprednisolone 40 mg/ml plus  4 cc of 1% lidocaine without epinephrine was injected into the right subacromial bursa using a(n) history of approach. The patient tolerated the procedure well. There were no complications. Post procedure instructions were given.   OBJECTIVE:  Vital signs are reviewed.   GEN.: Well-developed male no acute distress neck SHOULDERS: Symmetrical. Right shoulder has full range of motion. He has intact strength in all planes the rotator cuff area he has significant pain with supraspinatus and crossover testing. He has no tenderness over the acromioclavicular joint. No tenderness over the biceps tendon area. ULTRASOUND: Muscles of the rotator cuff reveals some mild scattered calcifications but no specific defect is noted. The acromioclavicular joint  reveals some arthropathy but no effusion.  ASSESSMENT / PLAN: Please see problem oriented charting for details

## 2016-07-07 NOTE — Assessment & Plan Note (Signed)
Most consistent with subacromial bursitis although I wonder if there some component of the acromioclavicular arthritis as well given the appearance on ultrasound. We discussed today and opted to try subacromial bursa injection and have him follow-up in 2-3 weeks. With.had any imaging of his shoulder but I do not get the sense that he has glenohumeral joint arthritis of any significance.

## 2016-07-10 LAB — HIV-1 RNA QUANT-NO REFLEX-BLD
HIV 1 RNA QUANT: DETECTED {copies}/mL — AB
HIV-1 RNA Quant, Log: <1.3 Log copies/mL — AB

## 2016-07-20 ENCOUNTER — Ambulatory Visit: Payer: Commercial Managed Care - HMO | Admitting: Internal Medicine

## 2016-07-21 ENCOUNTER — Ambulatory Visit
Admission: RE | Admit: 2016-07-21 | Discharge: 2016-07-21 | Disposition: A | Discharge status: Home / Self Care | Payer: Medicare HMO | Source: Ambulatory Visit | Attending: Family Medicine | Admitting: Family Medicine

## 2016-07-21 ENCOUNTER — Ambulatory Visit (INDEPENDENT_AMBULATORY_CARE_PROVIDER_SITE_OTHER): Payer: Medicare HMO | Admitting: Family Medicine

## 2016-07-21 ENCOUNTER — Encounter: Payer: Self-pay | Admitting: Family Medicine

## 2016-07-21 VITALS — BP 133/76 | Ht 70.0 in | Wt 161.0 lb

## 2016-07-21 DIAGNOSIS — M19011 Primary osteoarthritis, right shoulder: Secondary | ICD-10-CM | POA: Diagnosis not present

## 2016-07-21 DIAGNOSIS — M25511 Pain in right shoulder: Secondary | ICD-10-CM

## 2016-07-21 NOTE — Patient Instructions (Signed)
Please get your x rays I will call you about those next week Try the rehab exercises If no improvement or worsening let's see you back in 3-4 weeks and repeat your njection

## 2016-07-25 NOTE — Progress Notes (Signed)
    CHIEF COMPLAINT / HPI:  Right shoulder pain. He had pretty diffuse shoulder pain at last office visit and he underwent subacromial bursa injection. His pain is about 50-60% improved. He still has pain with specific motions above shoulder level but much less pain than previously. He's had no new symptoms specifically no weakness in the right upper extremity.  REVIEW OF SYSTEMS:  No fever, sweats, chills. No unusual weight change. See history of present illness.  OBJECTIVE:  Vital signs are reviewed.   GEN.: Well-developed slender man in no acute distress SHOULDER: Right. He has full range of motion. Pain with abduction above the 100, pain with forward flexion above 100 but he can attain full 180 range of motion. He has intact strength in all planes the rotator cuff.  ASSESSMENT / PLAN: Please see problem oriented charting for details

## 2016-07-25 NOTE — Assessment & Plan Note (Signed)
Causes sure whether not he had a component of glenohumeral arthritis or he's of capsulitis but his range of motion is significantly better after subacromial bursa injection. I'm not sure we can get him 100% better. We discussed options today and he chose to have further evaluation with x-ray to see if there is something there that could be surgically improved upon such as a bone spur etc. Advance his home exercise program and gave him a new theraband and today I'll see him back in 3-4 weeks. I'll call him with results of his x-ray. I would consider a second and final subacromial bursa injection at next office visit if he wants to proceed with that.

## 2016-08-01 ENCOUNTER — Ambulatory Visit (INDEPENDENT_AMBULATORY_CARE_PROVIDER_SITE_OTHER): Payer: Medicare HMO | Admitting: Internal Medicine

## 2016-08-01 ENCOUNTER — Other Ambulatory Visit: Payer: Self-pay | Admitting: Internal Medicine

## 2016-08-01 ENCOUNTER — Encounter: Payer: Self-pay | Admitting: Internal Medicine

## 2016-08-01 ENCOUNTER — Telehealth: Payer: Self-pay | Admitting: *Deleted

## 2016-08-01 DIAGNOSIS — B20 Human immunodeficiency virus [HIV] disease: Secondary | ICD-10-CM

## 2016-08-01 DIAGNOSIS — K219 Gastro-esophageal reflux disease without esophagitis: Secondary | ICD-10-CM

## 2016-08-01 NOTE — Telephone Encounter (Signed)
Patient called about his xray results, told him there was some degenerative changes but nothing severe like bone spurs or fracture. He is continuing his exercises and will follow up in April with Dr. Nori Riis

## 2016-08-01 NOTE — Progress Notes (Signed)
Patient Active Problem List   Diagnosis Date Noted  . Human immunodeficiency virus (HIV) disease ([place]) 02/27/2006    Priority: High  . Left ankle pain 08/07/2013    Priority: Medium  . Dyslipidemia 07/22/2012    Priority: Medium  . Acute-on-chronic kidney injury ([place]) 07/22/2012    Priority: Medium  . Erectile dysfunction 07/22/2012    Priority: Medium  . GERD 05/17/2010    Priority: Medium  . Dysphagia 03/08/2009    Priority: Medium  . Essential hypertension 02/27/2006    Priority: Medium  . Pain in joint of right shoulder 07/07/2016  . Right shoulder pain 07/03/2016  . Cough 05/17/2016  . Epistaxis 10/11/2015  . Seborrheic dermatitis of scalp 01/06/2015  . Diastolic CHF with preserved left ventricular function, NYHA class 2 ([place]) 01/06/2015  . Prostatitis 10/15/2014  . Muscle spasm of back 10/15/2014  . Exertional dyspnea 09/30/2014  . Insomnia 07/22/2014  . BPH with obstruction/lower urinary tract symptoms 06/17/2014  . Screening for colon cancer 10/07/2013  . CHEST PAIN 05/17/2010  . CERVICAL LYMPHADENOPATHY 09/19/2007  . CUTANEOUS ERUPTIONS, DRUG-INDUCED 05/28/2006  . CHOLECYSTECTOMY, HX OF 05/28/2006  . ESOPHAGITIS 02/27/2006  . ABSCESS, NECK 02/27/2006  . PNEUMONIA, HX OF 02/27/2006  . ARTHROSCOPY, KNEE, HX OF 02/27/2006    Patient's Medications  New Prescriptions   No medications on file  Previous Medications   ASPIRIN EC 81 MG TABLET    Take 1 tablet (81 mg total) by mouth daily.   FINASTERIDE (PROSCAR) 5 MG TABLET    Take 5 mg by mouth daily.    MULTIPLE VITAMINS-MINERALS (MULTIVITAMIN PO)    Take 1 tablet by mouth daily.   OMEPRAZOLE (PRILOSEC) 40 MG CAPSULE    Take 1 capsule (40 mg total) by mouth daily.   TAMSULOSIN (FLOMAX) 0.4 MG CAPS CAPSULE    TAKE TWO CAPSULES BY MOUTH ONCE DAILY   TRIUMEQ 600-50-300 MG TABLET    TAKE ONE TABLET BY MOUTH ONCE DAILY  Modified Medications   No medications on file  Discontinued Medications   GUAIFENESIN-CODEINE (ROBITUSSIN AC) 100-10 MG/5ML SYRUP    Take 5 mLs by mouth 3 (three) times daily as needed for cough.    Subjective: Jeffrey [name] is in for his routine HIV follow-up visit. He has had no problem obtaining, taking or tolerating his Triumeq as long as he takes it with breakfast. He has found that if he takes it on an empty stomach he will have some mild nausea without vomiting.   Review of Systems: Review of Systems  Constitutional: Negative for chills, diaphoresis and fever.  Gastrointestinal: Positive for nausea. Negative for abdominal pain, diarrhea and vomiting.  Musculoskeletal: Positive for joint pain.    Past Medical History:  Diagnosis Date  . HIV (human immunodeficiency virus infection) ([place]) 1999    Social History  Substance Use Topics  . Smoking status: Never Smoker  . Smokeless tobacco: Former Systems developer    Types: Chew     Comment: Rare use, about q 6 months  . Alcohol use No    Family History  Problem Relation Age of Onset  . Hypertension Mother   . Heart attack Mother 35    Allergies  Allergen Reactions  . Baclofen     Back pain. Patient said it made him feel crazy  . Efavirenz     Rash  . Nevirapine     Rash    Objective:  Vitals:   08/01/16 1059  BP: 135/87  Pulse: 74  Temp: 97.6 F (36.4 C)  TempSrc: Oral  Weight: 160 lb (72.6 kg)  Height: 5' 10"  (1.778 m)   Body mass index is 22.96 kg/m.  Physical Exam  Constitutional: He is oriented to person, place, and time.  He is in good spirits as usual.  HENT:  Mouth/Throat: No oropharyngeal exudate.  Eyes: Conjunctivae are normal.  Cardiovascular: Normal rate and regular rhythm.   No murmur heard. Pulmonary/Chest: Effort normal and breath sounds normal.  Abdominal: Soft. He exhibits no mass. There is no tenderness.  Musculoskeletal: Normal range of motion.  Neurological: He is alert and oriented to person, place, and time.  Skin: No rash noted.  Psychiatric: Mood and affect normal.      Lab Results Lab Results  Component Value Date   WBC 4.0 07/06/2016   HGB 13.1 (L) 07/06/2016   HCT 38.1 (L) 07/06/2016   MCV 99.7 07/06/2016   PLT 167 07/06/2016    Lab Results  Component Value Date   CREATININE 1.54 (H) 07/06/2016   BUN 22 07/06/2016   NA 141 07/06/2016   K 4.2 07/06/2016   CL 108 07/06/2016   CO2 29 07/06/2016    Lab Results  Component Value Date   ALT 17 07/06/2016   AST 19 07/06/2016   ALKPHOS 71 07/06/2016   BILITOT 0.3 07/06/2016    Lab Results  Component Value Date   CHOL 190 07/06/2016   HDL 53 07/06/2016   LDLCALC 113 (H) 07/06/2016   TRIG 122 07/06/2016   CHOLHDL 3.6 07/06/2016   HIV 1 RNA Quant (copies/mL)  Date Value  07/06/2016 <20 DETECTED (A)  01/06/2016 <20  06/29/2015 <20   CD4 T Cell Abs (/uL)  Date Value  07/06/2016 430  01/06/2016 460  06/29/2015 320 (L)     Problem List Items Addressed This Visit      High   Human immunodeficiency virus (HIV) disease ([place])    His infection remains under excellent long-term control. He will continue Triumeq and follow-up after lab work in 6 months.      Relevant Orders   T-helper cell (CD4)- ([facility] only)   HIV 1 RNA quant-no reflex-bld        [name], MD [facility] for [facility] [phone] pager   [phone] cell 08/01/2016, 11:23 AM

## 2016-08-01 NOTE — Assessment & Plan Note (Signed)
His infection remains under excellent long-term control. He will continue Triumeq and follow-up after lab work in 6 months.

## 2016-08-25 ENCOUNTER — Ambulatory Visit: Payer: Medicare HMO | Admitting: Family Medicine

## 2016-09-01 ENCOUNTER — Encounter: Payer: Self-pay | Admitting: Family Medicine

## 2016-09-01 ENCOUNTER — Ambulatory Visit (INDEPENDENT_AMBULATORY_CARE_PROVIDER_SITE_OTHER): Payer: Medicare HMO | Admitting: Family Medicine

## 2016-09-01 DIAGNOSIS — M25511 Pain in right shoulder: Secondary | ICD-10-CM

## 2016-09-02 NOTE — Progress Notes (Signed)
    CHIEF COMPLAINT / HPI:   Follow-up shoulder pain.  He has received 3 corticosteroid injections into his right subacromial bursa and he is now 100% improved.  Says he can do anything he wants without pain.  He is quite excited.  A little concerned because the left shoulder is bothering him with similar symptoms, aching deep in the joint with certain movements particularly overhead or for flexion.  Pain is only 1 or 2 out of 10 though he doesn't think he wants to do anything other than rehabilitation for it at this time.  He did want me to look at however.  REVIEW OF SYSTEMS:  No fever.  No new arthralgias.  No redness or swelling of either shoulder joint.  OBJECTIVE:  Vital signs are reviewed.   GENERAL: Well-developed male no acute distress SHOULDERS: Symmetrical.  Right shoulder has full painless range of motion in all planes the rotator cuff.  He still has very mild pain with active range of motion testing and supraspinatus plane but otherwise he has normal strength in his painless.  Left shoulder for range of motion and normal strength in all planes the rotator cuff area he has some mild pain with extreme ranges of internal rotation and supraspinatus testing but it is mild and not affecting his strength. NEURO: Distally is intact sensation to soft touch.  Normal grip strength.  ASSESSMENT / PLAN: Please see problem oriented charting for details

## 2016-09-02 NOTE — Assessment & Plan Note (Signed)
After 3 corticosteroid injections into the subacromial bursa and very good adherence to his home exercise program he is essentially pain free.  He has some very mild symptoms in the left shoulder but does not want to do anything specific about them at this time.  He will follow-up when necessary.  We will be very happy see this nice gentleman at any time in the future.

## 2016-10-04 ENCOUNTER — Ambulatory Visit (INDEPENDENT_AMBULATORY_CARE_PROVIDER_SITE_OTHER): Payer: Medicare HMO | Admitting: Family Medicine

## 2016-10-04 ENCOUNTER — Ambulatory Visit: Payer: Medicare HMO | Admitting: Internal Medicine

## 2016-10-04 ENCOUNTER — Encounter: Payer: Self-pay | Admitting: Family Medicine

## 2016-10-04 VITALS — BP 119/75 | HR 68 | Temp 98.1°F | Ht 70.0 in | Wt 162.4 lb

## 2016-10-04 DIAGNOSIS — K219 Gastro-esophageal reflux disease without esophagitis: Secondary | ICD-10-CM | POA: Diagnosis not present

## 2016-10-04 DIAGNOSIS — R1310 Dysphagia, unspecified: Secondary | ICD-10-CM

## 2016-10-04 DIAGNOSIS — Z8719 Personal history of other diseases of the digestive system: Secondary | ICD-10-CM

## 2016-10-04 NOTE — Patient Instructions (Signed)
It was nice to meet you. Start taking the protonix at night. I have referred you to the GI doctor   Esophagitis Esophagitis is inflammation of the esophagus. The esophagus is the tube that carries food and liquids from your mouth to your stomach. Esophagitis can cause soreness or pain in the esophagus. This condition can make it difficult and painful to swallow. What are the causes? Most causes of esophagitis are not serious. Common causes of this condition include:  Gastroesophageal reflux disease (GERD). This is when stomach contents move back up into the esophagus (reflux).  Repeated vomiting.  An allergic-type reaction, especially caused by food allergies (eosinophilic esophagitis).  Injury to the esophagus by swallowing large pills with or without water, or swallowing certain types of medicines.  Swallowing (ingesting) harmful chemicals, such as household cleaning products.  Heavy alcohol use.  An infection of the esophagus.This most often occurs in people who have a weakened immune system.  Radiation or chemotherapy treatment for cancer.  Certain diseases such as sarcoidosis, Crohn disease, and scleroderma. What are the signs or symptoms? Symptoms of this condition include:  Difficult or painful swallowing.  Pain with swallowing acidic liquids, such as citrus juices.  Pain with burping.  Chest pain.  Difficulty breathing.  Nausea.  Vomiting.  Pain in the abdomen.  Weight loss.  Ulcers in the mouth.  Patches of white material in the mouth (candidiasis).  Fever.  Coughing up blood or vomiting blood.  Stool that is black, tarry, or bright red. How is this diagnosed? Your health care provider will take a medical history and perform a physical exam. You may also have other tests, including:  An endoscopy to examine your stomach and esophagus with a small camera.  A test that measures the acidity level in your esophagus.  A test that measures how much  pressure is on your esophagus.  A barium swallow or modified barium swallow to show the shape, size, and functioning of your esophagus.  Allergy tests. How is this treated? Treatment for this condition depends on the cause of your esophagitis. In some cases, steroids or other medicines may be given to help relieve your symptoms or to treat the underlying cause of your condition. You may have to make some lifestyle changes, such as:  Avoiding alcohol.  Quitting smoking.  Changing your diet.  Exercising.  Changing your sleep habits and your sleep environment. Follow these instructions at home: Take these actions to decrease your discomfort and to help avoid complications. Diet   Follow a diet as recommended by your health care provider. This may involve avoiding foods and drinks such as:  Coffee and tea (with or without caffeine).  Drinks that contain alcohol.  Energy drinks and sports drinks.  Carbonated drinks or sodas.  Chocolate and cocoa.  Peppermint and mint flavorings.  Garlic and onions.  Horseradish.  Spicy and acidic foods, including peppers, chili powder, curry powder, vinegar, hot sauces, and barbecue sauce.  Citrus fruit juices and citrus fruits, such as oranges, lemons, and limes.  Tomato-based foods, such as red sauce, chili, salsa, and pizza with red sauce.  Fried and fatty foods, such as donuts, french fries, potato chips, and high-fat dressings.  High-fat meats, such as hot dogs and fatty cuts of red and white meats, such as rib eye steak, sausage, ham, and bacon.  High-fat dairy items, such as whole milk, butter, and cream cheese.  Eat small, frequent meals instead of large meals.  Avoid drinking large amounts of  liquid with your meals.  Avoid eating meals during the 2-3 hours before bedtime.  Avoid lying down right after you eat.  Do not exercise right after you eat.  Avoid foods and drinks that seem to make your symptoms worse. General  instructions   Pay attention to any changes in your symptoms.  Take over-the-counter and prescription medicines only as told by your health care provider. Do not take aspirin, ibuprofen, or other NSAIDs unless your health care provider told you to do so.  If you have trouble taking pills, use a pill splitter to decrease the size of the pill. This will decrease the chance of the pill getting stuck or injuring your esophagus on the way down. Also, drink water after you take a pill.  Do not use any tobacco products, including cigarettes, chewing tobacco, and e-cigarettes. If you need help quitting, ask your health care provider.  Wear loose-fitting clothing. Do not wear anything tight around your waist that causes pressure on your abdomen.  Raise (elevate) the head of your bed about 6 inches (15 cm).  Try to reduce your stress, such as with yoga or meditation. If you need help reducing stress, ask your health care provider.  If you are overweight, reduce your weight to an amount that is healthy for you. Ask your health care provider for guidance about a safe weight loss goal.  Keep all follow-up visits as told by your health care provider. This is important. Contact a health care provider if:  You have new symptoms.  You have unexplained weight loss.  You have difficulty swallowing, or it hurts to swallow.  You have wheezing or a persistent cough.  Your symptoms do not improve with treatment.  You have frequent heartburn for more than two weeks. Get help right away if:  You have severe pain in your arms, neck, jaw, teeth, or back.  You feel sweaty, dizzy, or light-headed.  You have chest pain or shortness of breath.  You vomit and your vomit looks like blood or coffee grounds.  Your stool is bloody or black.  You have a fever.  You cannot swallow, drink, or eat. This information is not intended to replace advice given to you by your health care provider. Make sure you  discuss any questions you have with your health care provider. Document Released: 06/01/2004 Document Revised: 09/30/2015 Document Reviewed: 08/19/2014 Elsevier Interactive Patient Education  2017 [employer].

## 2016-10-04 NOTE — Progress Notes (Signed)
Subjective: CC: stomach and reflux HPI: Patient is a 71 y.o. male with a past medical history of esophagitis and HIV presenting to clinic today for a SDA for reflux.  Patient has reflux most notable at bedtime. Describes it as burning sensation in epigastric region that goes up to his mouth, foul taste.   It has been ongoing for a long time, but now daily for the last few weeks.  Feels like he needs to vomit but hops up out of bed and symptoms resolve. Sometimes has it during the day, but it is short lived and he notes certain foods cause it.  Pancakes and syrup bothers hims, so do fried eggs and hashbrowns. He has not changed what he eats.   Last meal is typically at 11pm goes to bed around midnight or 1:30 No alcohol or caffeine use.  No vomiting. No nausea. Normal BMs without hematochezia or melena. Otherwise no abdominal pain. No weight loss.  No oral lesion.  No change in medications recently  No weight loss  Currently taking Prilosec 40 qAM without improvement.   Has a h/o esphagitis and had a his esophagus stretched around 4 years ago per his report Additionally, for the last 8 months having some dysphagia- feels some foods get stuck like meat, liquids are not an issue at this time.  Social History: previously used chewing tobacco   ROS: All other systems reviewed and are negative.  Past Medical History Patient Active Problem List   Diagnosis Date Noted  . Pain in joint of right shoulder 07/07/2016  . Right shoulder pain 07/03/2016  . Cough 05/17/2016  . Seborrheic dermatitis of scalp 01/06/2015  . Diastolic CHF with preserved left ventricular function, NYHA class 2 ([place]) 01/06/2015  . Prostatitis 10/15/2014  . Muscle spasm of back 10/15/2014  . Exertional dyspnea 09/30/2014  . Insomnia 07/22/2014  . BPH with obstruction/lower urinary tract symptoms 06/17/2014  . Screening for colon cancer 10/07/2013  . Left ankle pain 08/07/2013  . Dyslipidemia 07/22/2012  .  Erectile dysfunction 07/22/2012  . GERD 05/17/2010  . CHEST PAIN 05/17/2010  . Dysphagia 03/08/2009  . CERVICAL LYMPHADENOPATHY 09/19/2007  . CUTANEOUS ERUPTIONS, DRUG-INDUCED 05/28/2006  . CHOLECYSTECTOMY, HX OF 05/28/2006  . Human immunodeficiency virus (HIV) disease ([place]) 02/27/2006  . Essential hypertension 02/27/2006  . ESOPHAGITIS 02/27/2006  . PNEUMONIA, HX OF 02/27/2006  . ARTHROSCOPY, KNEE, HX OF 02/27/2006    Medications- reviewed and updated Current Outpatient Prescriptions  Medication Sig Dispense Refill  . aspirin EC 81 MG tablet Take 1 tablet (81 mg total) by mouth daily.    . finasteride (PROSCAR) 5 MG tablet Take 5 mg by mouth daily.     . Multiple Vitamins-Minerals (MULTIVITAMIN PO) Take 1 tablet by mouth daily.    Marland Kitchen omeprazole (PRILOSEC) 40 MG capsule TAKE ONE CAPSULE BY MOUTH ONCE DAILY 90 capsule 2  . tamsulosin (FLOMAX) 0.4 MG CAPS capsule TAKE TWO CAPSULES BY MOUTH ONCE DAILY 180 capsule 3  . TRIUMEQ 600-50-300 MG tablet TAKE ONE TABLET BY MOUTH ONCE DAILY 30 tablet 5   No current facility-administered medications for this visit.     Objective: Office vital signs reviewed. BP 119/75 (BP Location: Right Arm, Patient Position: Sitting, Cuff Size: Normal)   Pulse 68   Temp 98.1 F (36.7 C) (Oral)   Ht 5' 10"  (1.778 m)   Wt 162 lb 6.4 oz (73.7 kg)   SpO2 94%   BMI 23.30 kg/m    Physical Examination:  General: Awake, alert, well- nourished, NAD ENMT:  TMs intact, normal light reflex, no erythema, no bulging. Nasal turbinates moist. MMM, Oropharynx clear without erythema or tonsillar exudate/hypertrophy. No lesions, no evidence of thrush. Cardio: RRR, no m/r/g noted.  Pulm: No increased WOB.  CTAB, without wheezes, rhonchi or crackles noted.  GI: soft, NT/ND,+BS x4, no hepatomegaly, no splenomegaly  Assessment/Plan: Dysphagia The patient has a history of peptic stricture that was previously dilated per chart review. I'm concerned that this has returned.  Will refer to [facility] for further evaluation and possible EGD with dilation if needed.  GERD Symptoms worsening. Will change Protonix to qHS instead of qAM. Discussed lifestyle modifications. No evidence of thrush or other infections on examination. May have return of esophagitis. No red flags on exam. No weight loss - will refer back to GI.   Orders Placed This Encounter  Procedures  . Ambulatory referral to Gastroenterology    Referral Priority:   Routine    Referral Type:   Consultation    Referral Reason:   Specialty Services Required    Number of Visits Requested:   1    No orders of the defined types were placed in this encounter.   Archie Patten PGY-3, [place]

## 2016-10-04 NOTE — Assessment & Plan Note (Signed)
The patient has a history of peptic stricture that was previously dilated per chart review. I'm concerned that this has returned. Will refer to [facility] for further evaluation and possible EGD with dilation if needed.

## 2016-10-04 NOTE — Assessment & Plan Note (Addendum)
Symptoms worsening. Will change Protonix to qHS instead of qAM. Discussed lifestyle modifications. No evidence of thrush or other infections on examination. May have return of esophagitis. No red flags on exam. No weight loss - will refer back to GI.

## 2016-10-06 ENCOUNTER — Encounter: Payer: Self-pay | Admitting: Nurse Practitioner

## 2016-10-06 ENCOUNTER — Other Ambulatory Visit (INDEPENDENT_AMBULATORY_CARE_PROVIDER_SITE_OTHER): Payer: Medicare HMO

## 2016-10-06 ENCOUNTER — Ambulatory Visit (INDEPENDENT_AMBULATORY_CARE_PROVIDER_SITE_OTHER): Payer: Medicare HMO | Admitting: Nurse Practitioner

## 2016-10-06 VITALS — BP 132/70 | HR 76 | Ht 70.0 in | Wt 160.2 lb

## 2016-10-06 DIAGNOSIS — K219 Gastro-esophageal reflux disease without esophagitis: Secondary | ICD-10-CM

## 2016-10-06 DIAGNOSIS — R1013 Epigastric pain: Secondary | ICD-10-CM | POA: Diagnosis not present

## 2016-10-06 LAB — COMPREHENSIVE METABOLIC PANEL
ALK PHOS: 78 U/L (ref 39–117)
ALT: 20 U/L (ref 0–53)
AST: 18 U/L (ref 0–37)
Albumin: 4.2 g/dL (ref 3.5–5.2)
BUN: 27 mg/dL — ABNORMAL HIGH (ref 6–23)
CO2: 30 mEq/L (ref 19–32)
Calcium: 9.5 mg/dL (ref 8.4–10.5)
Chloride: 105 mEq/L (ref 96–112)
Creatinine, Ser: 1.56 mg/dL — ABNORMAL HIGH (ref 0.40–1.50)
GFR: 47.16 mL/min — AB (ref >60.00)
GLUCOSE: 91 mg/dL (ref 70–99)
POTASSIUM: 3.9 meq/L (ref 3.5–5.1)
SODIUM: 139 meq/L (ref 135–145)
TOTAL PROTEIN: 7 g/dL (ref 6.0–8.3)
Total Bilirubin: 0.3 mg/dL (ref 0.2–1.2)

## 2016-10-06 LAB — LIPASE: LIPASE: 9 U/L — AB (ref 11.0–59.0)

## 2016-10-06 NOTE — Progress Notes (Signed)
     HPI: Patient is a 71 year old male known to Dr. Havery [name]. He had a screening colonoscopy March 2017 with removal of a small cecal polyp (not retrieved) and a small ascending colon tubular adenoma without HGD. Patient has HIV, on Triumeq and followed by Dr. Megan [name]. He says the virus is undetectable at this point. Jeffrey [name] is referred by PCP Dr. Lorenso [name] for abdominal pain / GERD. He gives a several week history of mid upper abdominal pain radiating upward into his chest. The pain is described as burning, it is mostly postprandial and in the evenings. When the pain occurs it usually last about 45 minutes to an hour. He has been having less episodes since discontinuing Pepsi. The burning feels like heartburn. Patient taken a daily PPI for years.  Occasionally nausea and vomiting accompanied the pain but neither are predominant symptoms. He has tried ibuprofen for the pain, prior to that no significant end-stage use. He has no black stools. He does complain of dysphagia, mainly to meats. This problem started a few weeks ago. No odynophagia. He has no history of Candida esophagitis. The staple.    Past Medical History:  Diagnosis Date  . HIV (human immunodeficiency virus infection) ([place]) 1999    Patient's surgical history, family medical history, social history, medications and allergies were all reviewed in Epic    Physical Exam: BP 132/70   Pulse 76   Ht 5' 10"  (1.778 m)   Wt 160 lb 3.2 oz (72.7 kg)   BMI 22.99 kg/m   GENERAL:  well-developed white male in NAD PSYCH: :Pleasant, cooperative, normal affect EENT:  conjunctiva pink, mucous membranes moist, neck supple without masses CARDIAC:  RRR, no murmur heard, no peripheral edema PULM: Normal respiratory effort, lungs CTA bilaterally, no wheezing ABDOMEN:  soft, nontender, nondistended, no obvious masses, no hepatomegaly,  normal bowel sounds SKIN:  turgor, no lesions seen Musculoskeletal: normal muscle tone, normal strength NEURO:  Alert and oriented x 3, no focal neurologic deficits    ASSESSMENT and PLAN:  71 year old male with several week history of mid upper abdominal pain radiating upward into her chest. Pain burning, feels a lot like heartburn. He also complains with food dysphagia, mainly to meat. No odynophagia. -CMET, CBC -For further evaluation of a several week history of upper abdominal pain and heartburn refractory to PPI patient will be scheduled for an upper endoscopy.  Given dysphagia he may need esophageal dilation if stricture present. -Advised patient to eat small bites, chew well with liquids in between bites to avoid food impaction.   HIV, under excellent control.    Jeffrey [name] , NP 10/06/2016, 3:16 PM   Cc:  Jeffrey Cords, MD

## 2016-10-06 NOTE — Patient Instructions (Signed)
If you are age 69 or older, your body mass index should be between 23-30. Your Body mass index is 22.99 kg/m. If this is out of the aforementioned range listed, please consider follow up with your Primary Care Provider.  If you are age 9 or younger, your body mass index should be between 19-25. Your Body mass index is 22.99 kg/m. If this is out of the aformentioned range listed, please consider follow up with your Primary Care Provider.   You have been scheduled for an endoscopy. Please follow written instructions given to you at your visit today. If you use inhalers (even only as needed), please bring them with you on the day of your procedure. Your physician has requested that you go to www.startemmi.com and enter the access code given to you at your visit today. This web site gives a general overview about your procedure. However, you should still follow specific instructions given to you by our office regarding your preparation for the procedure.  Your physician has requested that you go to the basement for the following lab work before leaving today: CMET Lipase  Take Prilosec 40 mg before evening meal.  Thank you for choosing me and [facility].   [name], NP

## 2016-10-09 NOTE — Progress Notes (Signed)
Agree with assessment and plan as outlined. He can try increasing omeprazole to BID in the interim until EGD is done and see if this helps.

## 2016-10-11 ENCOUNTER — Other Ambulatory Visit: Payer: Self-pay

## 2016-10-11 DIAGNOSIS — K219 Gastro-esophageal reflux disease without esophagitis: Secondary | ICD-10-CM

## 2016-10-11 MED ORDER — OMEPRAZOLE 40 MG PO CPDR: 40.0000 mg | DELAYED_RELEASE_CAPSULE | Freq: Two times a day (BID) | ORAL | 2 refills | Status: DC

## 2016-10-12 ENCOUNTER — Telehealth: Payer: Self-pay

## 2016-10-12 NOTE — Telephone Encounter (Signed)
[place] sent copy of prescription by fax, questioning how Omeprazole should be taken.  RX states 40 mg twice daily.  Per pharmacy the patient states he is supposed to be taking 20 mg twice daily.  I spoke with [facility] and she stated that [place] upped to 40 mg bid until procedure is done.  I left a voicemail for pt and advised to take 40 mg twice daily.

## 2016-10-16 ENCOUNTER — Encounter: Payer: Self-pay | Admitting: Gastroenterology

## 2016-10-16 ENCOUNTER — Ambulatory Visit (AMBULATORY_SURGERY_CENTER): Payer: Medicare HMO | Admitting: Gastroenterology

## 2016-10-16 VITALS — BP 123/81 | HR 62 | Temp 97.7°F | Resp 18 | Ht 70.0 in | Wt 160.0 lb

## 2016-10-16 DIAGNOSIS — R1310 Dysphagia, unspecified: Secondary | ICD-10-CM | POA: Diagnosis present

## 2016-10-16 DIAGNOSIS — K219 Gastro-esophageal reflux disease without esophagitis: Secondary | ICD-10-CM | POA: Diagnosis not present

## 2016-10-16 DIAGNOSIS — K3189 Other diseases of stomach and duodenum: Secondary | ICD-10-CM | POA: Diagnosis not present

## 2016-10-16 DIAGNOSIS — R1013 Epigastric pain: Secondary | ICD-10-CM | POA: Diagnosis not present

## 2016-10-16 DIAGNOSIS — K2900 Acute gastritis without bleeding: Secondary | ICD-10-CM

## 2016-10-16 MED ORDER — SODIUM CHLORIDE 0.9 % IV SOLN: 500.0000 mL | INTRAVENOUS | Status: DC

## 2016-10-16 NOTE — Patient Instructions (Signed)
Impression/Recommendations:  Dilation diet handout given to patient.  Resume previous diet. Continue present medications. Await pathology results.  YOU HAD AN ENDOSCOPIC PROCEDURE TODAY AT [facility]:   Refer to the procedure report that was given to you for any specific questions about what was found during the examination.  If the procedure report does not answer your questions, please call your gastroenterologist to clarify.  If you requested that your care partner not be given the details of your procedure findings, then the procedure report has been included in a sealed envelope for you to review at your convenience later.  YOU SHOULD EXPECT: Some feelings of bloating in the abdomen. Passage of more gas than usual.  Walking can help get rid of the air that was put into your GI tract during the procedure and reduce the bloating. If you had a lower endoscopy (such as a colonoscopy or flexible sigmoidoscopy) you may notice spotting of blood in your stool or on the toilet paper. If you underwent a bowel prep for your procedure, you may not have a normal bowel movement for a few days.  Please Note:  You might notice some irritation and congestion in your nose or some drainage.  This is from the oxygen used during your procedure.  There is no need for concern and it should clear up in a day or so.  SYMPTOMS TO REPORT IMMEDIATELY:  Following upper endoscopy (EGD)  Vomiting of blood or coffee ground material  New chest pain or pain under the shoulder blades  Painful or persistently difficult swallowing  New shortness of breath  Fever of 100F or higher  Black, tarry-looking stools  For urgent or emergent issues, a gastroenterologist can be reached at any hour by calling [phone].   DIET:  We do recommend a small meal at first, but then you may proceed to your regular diet.  Drink plenty of fluids but you should avoid alcoholic beverages for 24 hours.  ACTIVITY:  You  should plan to take it easy for the rest of today and you should NOT DRIVE or use heavy machinery until tomorrow (because of the sedation medicines used during the test).    FOLLOW UP: Our staff will call the number listed on your records the next business day following your procedure to check on you and address any questions or concerns that you may have regarding the information given to you following your procedure. If we do not reach you, we will leave a message.  However, if you are feeling well and you are not experiencing any problems, there is no need to return our call.  We will assume that you have returned to your regular daily activities without incident.  If any biopsies were taken you will be contacted by phone or by letter within the next 1-3 weeks.  Please call us at [phone] if you have not heard about the biopsies in 3 weeks.    SIGNATURES/CONFIDENTIALITY: You and/or your care partner have signed paperwork which will be entered into your electronic medical record.  These signatures attest to the fact that that the information above on your After Visit Summary has been reviewed and is understood.  Full responsibility of the confidentiality of this discharge information lies with you and/or your care-partner.

## 2016-10-16 NOTE — Progress Notes (Signed)
Pt's states no medical or surgical changes since previsit or office visit. 

## 2016-10-16 NOTE — Op Note (Signed)
[facility] Patient Name: Jeffrey [name] Procedure Date: 10/16/2016 9:54 AM MRN: [id number] Endoscopist: Jeffrey Lipps P. Jeffrey Kaminski MD, MD Age: 71 Referring MD:  Date of Birth: 05/24/1945 Gender: Male Account #: [id number] Procedure:                Upper GI endoscopy Indications:              Epigastric abdominal pain, Dysphagia, Heartburn,                            Vomiting - history of dilation for dysphagia                            several years ago with benefit Medicines:                Monitored Anesthesia Care Procedure:                Pre-Anesthesia Assessment:                           - Prior to the procedure, a History and Physical                            was performed, and patient medications and                            allergies were reviewed. The patient's tolerance of                            previous anesthesia was also reviewed. The risks                            and benefits of the procedure and the sedation                            options and risks were discussed with the patient.                            All questions were answered, and informed consent                            was obtained. Prior Anticoagulants: The patient has                            taken no previous anticoagulant or antiplatelet                            agents. ASA Grade Assessment: II - A patient with                            mild systemic disease. After reviewing the risks                            and benefits, the patient was deemed in  satisfactory condition to undergo the procedure.                           After obtaining informed consent, the endoscope was                            passed under direct vision. Throughout the                            procedure, the patient's blood pressure, pulse, and                            oxygen saturations were monitored continuously. The                            Endoscope was introduced  through the mouth, and                            advanced to the second part of duodenum. The upper                            GI endoscopy was accomplished without difficulty.                            The patient tolerated the procedure well. Scope In: Scope Out: Findings:                 Esophagogastric landmarks were identified: the                            Z-line was found at 36 cm, the gastroesophageal                            junction was found at 36 cm and the upper extent of                            the gastric folds was found at 36 cm from the                            incisors.                           The exam of the esophagus was otherwise normal. No                            obvious inflammatory changes or stenosis                            appreciated.                           A guidewire was placed and the scope was withdrawn.                            Empiric dilation was  performed in the entire                            esophagus with a Savary dilator with no resistance                            at 17 mm and 18 mm. Re-look endoscope showed no                            mucosal wrents following dilation. Biopsies were                            taken with a cold forceps in the upper third of the                            esophagus, in the middle third of the esophagus and                            in the lower third of the esophagus for histology                            to rule out eosinophilic esophagitis.                           Patchy mildly erythematous mucosa without bleeding                            was found in the gastric antrum. Biopsies were                            taken with a cold forceps for histology and                            Helicobacter pylori testing.                           The exam of the stomach was otherwise normal.                           The duodenal bulb and second portion of the                             duodenum were normal. Complications:            No immediate complications. Estimated blood loss:                            Minimal. Estimated Blood Loss:     Estimated blood loss was minimal. Impression:               - Esophagogastric landmarks identified.                           - Normal esophagus otherwise - empiric dilation to  59m performed, biopsies taken.                           - Erythematous mucosa in the antrum. Biopsied.                           - Normal duodenal bulb and second portion of the                            duodenum. Recommendation:           - Patient has a contact number available for                            emergencies. The signs and symptoms of potential                            delayed complications were discussed with the                            patient. Return to normal activities tomorrow.                            Written discharge instructions were provided to the                            patient.                           - Resume previous diet.                           - Continue present medications.                           - Await course following dilation and pathology                            results. SRemo LippsP. Jeffrey Ringgenberg MD, MD 10/16/2016 10:15:37 AM This report has been signed electronically.

## 2016-10-16 NOTE — Progress Notes (Signed)
Called to room to assist during endoscopic procedure.  Patient ID and intended procedure confirmed with present staff. Received instructions for my participation in the procedure from the performing physician.  

## 2016-10-16 NOTE — Progress Notes (Signed)
To PACU VSS Report to RN 

## 2016-10-17 ENCOUNTER — Telehealth: Payer: Self-pay | Admitting: *Deleted

## 2016-10-17 ENCOUNTER — Other Ambulatory Visit: Payer: Self-pay | Admitting: Internal Medicine

## 2016-10-17 DIAGNOSIS — B20 Human immunodeficiency virus [HIV] disease: Secondary | ICD-10-CM

## 2016-10-17 NOTE — Telephone Encounter (Signed)
  Follow up Call-  Call back number 10/16/2016 08/04/2015  Post procedure Call Back phone  # [phone]  Permission to leave phone message Yes Yes  Some recent data might be hidden     Patient questions:  Do you have a fever, pain , or abdominal swelling? No. Pain Score  0 *  Have you tolerated food without any problems? Yes.    Have you been able to return to your normal activities? Yes.    Do you have any questions about your discharge instructions: Diet   No. Medications  No. Follow up visit  No.  Do you have questions or concerns about your Care? Yes.    Actions: * If pain score is 4 or above: No action needed, pain <4.

## 2016-10-19 ENCOUNTER — Encounter: Payer: Self-pay | Admitting: Gastroenterology

## 2016-10-31 ENCOUNTER — Encounter: Payer: Self-pay | Admitting: Student

## 2016-10-31 ENCOUNTER — Ambulatory Visit (INDEPENDENT_AMBULATORY_CARE_PROVIDER_SITE_OTHER): Payer: Medicare HMO | Admitting: Student

## 2016-10-31 DIAGNOSIS — M62830 Muscle spasm of back: Secondary | ICD-10-CM

## 2016-10-31 MED ORDER — CYCLOBENZAPRINE HCL 10 MG PO TABS: 10.0000 mg | ORAL_TABLET | Freq: Three times a day (TID) | ORAL | 0 refills | Status: DC | PRN

## 2016-10-31 NOTE — Progress Notes (Signed)
   Subjective:    Patient ID: Jeffrey [name], male    DOB: 06-21-1945, 71 y.o.   MRN: [id number]   CC: back pain  HPI: 71 y/o M presents for back pain  Back pain - started 2 days ago after getting up suddenly from a lounge chair - worse with movement - no weakness - has had this in past , treated with  Baclofen but he did not like the baclofen   Smoking status reviewed  Review of Systems  Per HPI, else denies recent illness, fever, chest pain, shortness of breath    Objective:  BP 128/70   Pulse 67   Temp 97.8 F (36.6 C) (Oral)   Ht 5' 10"  (1.778 m)   Wt 161 lb (73 kg)   SpO2 98%   BMI 23.10 kg/m  Vitals and nursing note reviewed  General: NAD Cardiac: RRR Respiratory: CTAB, normal effort MSK: tenderness to palpation below the right costochondral margin at the 2-3 cm surrounding the mid axillary line, no overlying rashes Skin: warm and dry, no rashes noted Neuro: alert and oriented, no focal deficits   Assessment & Plan:    Muscle spasm of back History and physical exam consistent with muscle spasm - will try flexeril, tylenol and heating pad - follow up as needed    Felder Lebeda A. Lincoln Brigham MD, [facility] Resident PGY-3 Pager [phone]

## 2016-10-31 NOTE — Patient Instructions (Signed)
Follow up as needed Take Tylenol and Flexeril for muscle spasm Try a heating pad over the affected muscle Call the office with questions or concerns

## 2016-10-31 NOTE — Assessment & Plan Note (Signed)
History and physical exam consistent with muscle spasm - will try flexeril, tylenol and heating pad - follow up as needed

## 2017-01-09 ENCOUNTER — Other Ambulatory Visit: Payer: Medicare HMO

## 2017-01-09 DIAGNOSIS — B20 Human immunodeficiency virus [HIV] disease: Secondary | ICD-10-CM | POA: Diagnosis not present

## 2017-01-10 LAB — T-HELPER CELL (CD4) - (RCID CLINIC ONLY)
CD4 T CELL ABS: 310 /uL — AB (ref 400–2700)
CD4 T CELL HELPER: 22 % — AB (ref 33–55)

## 2017-01-11 LAB — HIV-1 RNA QUANT-NO REFLEX-BLD
HIV 1 RNA Quant: <20 copies/mL
HIV-1 RNA Quant, Log: <1.3 Log copies/mL

## 2017-01-23 ENCOUNTER — Ambulatory Visit (INDEPENDENT_AMBULATORY_CARE_PROVIDER_SITE_OTHER): Payer: Medicare HMO | Admitting: Internal Medicine

## 2017-01-23 VITALS — BP 125/82 | HR 80 | Temp 98.7°F | Wt 167.4 lb

## 2017-01-23 DIAGNOSIS — B20 Human immunodeficiency virus [HIV] disease: Secondary | ICD-10-CM

## 2017-01-23 DIAGNOSIS — Z23 Encounter for immunization: Secondary | ICD-10-CM | POA: Diagnosis not present

## 2017-01-23 NOTE — Assessment & Plan Note (Signed)
His infection remains under excellent, long-term control. He will continue Triumeq. I told him that there are smaller, one pill a day regimens if he has more difficulty swallowing Triumeq. I asked him to let us know if he has more difficulties between now and his next visit. I offered him the option of follow-up in one year but he would like to follow-up in 6 months.

## 2017-01-23 NOTE — Progress Notes (Signed)
Patient Active Problem List   Diagnosis Date Noted  . Human immunodeficiency virus (HIV) disease ([place]) 02/27/2006    Priority: High  . Left ankle pain 08/07/2013    Priority: Medium  . Dyslipidemia 07/22/2012    Priority: Medium  . Erectile dysfunction 07/22/2012    Priority: Medium  . GERD 05/17/2010    Priority: Medium  . Dysphagia 03/08/2009    Priority: Medium  . Essential hypertension 02/27/2006    Priority: Medium  . Pain in joint of right shoulder 07/07/2016  . Right shoulder pain 07/03/2016  . Cough 05/17/2016  . Seborrheic dermatitis of scalp 01/06/2015  . Diastolic CHF with preserved left ventricular function, NYHA class 2 ([place]) 01/06/2015  . Prostatitis 10/15/2014  . Muscle spasm of back 10/15/2014  . Exertional dyspnea 09/30/2014  . Insomnia 07/22/2014  . BPH with obstruction/lower urinary tract symptoms 06/17/2014  . Screening for colon cancer 10/07/2013  . CHEST PAIN 05/17/2010  . CERVICAL LYMPHADENOPATHY 09/19/2007  . CUTANEOUS ERUPTIONS, DRUG-INDUCED 05/28/2006  . CHOLECYSTECTOMY, HX OF 05/28/2006  . ESOPHAGITIS 02/27/2006  . PNEUMONIA, HX OF 02/27/2006  . ARTHROSCOPY, KNEE, HX OF 02/27/2006    Patient's Medications  New Prescriptions   No medications on file  Previous Medications   ASPIRIN EC 81 MG TABLET    Take 1 tablet (81 mg total) by mouth daily.   CYCLOBENZAPRINE (FLEXERIL) 10 MG TABLET    Take 1 tablet (10 mg total) by mouth 3 (three) times daily as needed for muscle spasms.   FINASTERIDE (PROSCAR) 5 MG TABLET    Take 5 mg by mouth daily.    MULTIPLE VITAMINS-MINERALS (MULTIVITAMIN PO)    Take 1 tablet by mouth daily.   OMEPRAZOLE (PRILOSEC) 40 MG CAPSULE    Take 1 capsule (40 mg total) by mouth 2 (two) times daily.   TAMSULOSIN (FLOMAX) 0.4 MG CAPS CAPSULE    TAKE TWO CAPSULES BY MOUTH ONCE DAILY   TRIUMEQ 600-50-300 MG TABLET    TAKE ONE TABLET BY MOUTH ONCE DAILY  Modified Medications   No medications on file  Discontinued  Medications   No medications on file    Subjective: Jeffrey [name] is in for his routine HIV follow-up visit. He has had no problems obtaining or taking his Triumeq. He is having more problems with dysphagia with pills and solid food but has been able to get his Triumeq down. He is also having some epigastric pain but otherwise feeling well other than his usual aches and pains.   Review of Systems: Review of Systems  Constitutional: Negative for chills, diaphoresis, fever, malaise/fatigue and weight loss.  HENT: Negative for sore throat.        As noted in history of present illness.  Respiratory: Negative for cough, sputum production and shortness of breath.   Cardiovascular: Negative for chest pain.  Gastrointestinal: Negative for abdominal pain, diarrhea, heartburn, nausea and vomiting.  Genitourinary: Negative for dysuria and frequency.  Musculoskeletal: Positive for joint pain. Negative for myalgias.  Skin: Negative for rash.  Neurological: Negative for dizziness and headaches.  Psychiatric/Behavioral: Negative for depression and substance abuse. The patient is not nervous/anxious.     Past Medical History:  Diagnosis Date  . HIV (human immunodeficiency virus infection) ([place]) 1999    Social History  Substance Use Topics  . Smoking status: Never Smoker  . Smokeless tobacco: Former Systems developer    Types: Chew     Comment: Rare use, about q 6  months  . Alcohol use Yes     Comment: rare    Family History  Problem Relation Age of Onset  . Hypertension Mother   . Heart attack Mother 57  . Colon cancer Neg Hx   . Esophageal cancer Neg Hx   . Pancreatic cancer Neg Hx   . Stomach cancer Neg Hx   . Liver disease Neg Hx     Allergies  Allergen Reactions  . Baclofen     Back pain. Patient said it made him feel crazy  . Efavirenz     Rash  . Nevirapine     Rash    Objective:  Vitals:   01/23/17 1011  BP: 125/82  Pulse: 80  Temp: 98.7 F (37.1 C)  Weight: 167 lb 6.4 oz (75.9  kg)   Body mass index is 24.02 kg/m.  Physical Exam  Constitutional: He is oriented to person, place, and time.  He is in good spirits as usual.  HENT:  Mouth/Throat: No oropharyngeal exudate.  Eyes: Conjunctivae are normal.  Cardiovascular: Normal rate and regular rhythm.   No murmur heard. Pulmonary/Chest: Effort normal and breath sounds normal.  Abdominal: Soft. He exhibits no mass. There is no tenderness.  Musculoskeletal: Normal range of motion.  Neurological: He is alert and oriented to person, place, and time.  Skin: No rash noted.  Psychiatric: Mood and affect normal.    Lab Results Lab Results  Component Value Date   WBC 4.0 07/06/2016   HGB 13.1 (L) 07/06/2016   HCT 38.1 (L) 07/06/2016   MCV 99.7 07/06/2016   PLT 167 07/06/2016    Lab Results  Component Value Date   CREATININE 1.56 (H) 10/06/2016   BUN 27 (H) 10/06/2016   NA 139 10/06/2016   K 3.9 10/06/2016   CL 105 10/06/2016   CO2 30 10/06/2016    Lab Results  Component Value Date   ALT 20 10/06/2016   AST 18 10/06/2016   ALKPHOS 78 10/06/2016   BILITOT 0.3 10/06/2016    Lab Results  Component Value Date   CHOL 190 07/06/2016   HDL 53 07/06/2016   LDLCALC 113 (H) 07/06/2016   TRIG 122 07/06/2016   CHOLHDL 3.6 07/06/2016   Lab Results  Component Value Date   LABRPR NON REAC 07/06/2016   HIV 1 RNA Quant (copies/mL)  Date Value  01/09/2017 <20 NOT DETECTED  07/06/2016 <20 DETECTED (A)  01/06/2016 <20   CD4 T Cell Abs (/uL)  Date Value  01/09/2017 310 (L)  07/06/2016 430  01/06/2016 460     Problem List Items Addressed This Visit      High   Human immunodeficiency virus (HIV) disease ([place])    His infection remains under excellent, long-term control. He will continue Triumeq. I told him that there are smaller, one pill a day regimens if he has more difficulty swallowing Triumeq. I asked him to let us know if he has more difficulties between now and his next visit. I offered him the  option of follow-up in one year but he would like to follow-up in 6 months.      Relevant Orders   T-helper cell (CD4)- ([facility] only)   HIV 1 RNA quant-no reflex-bld   CBC   Comprehensive metabolic panel   Lipid panel   RPR    Other Visit Diagnoses    Flu vaccine need    -  Primary   Relevant Orders   Flu Vaccine QUAD 36+ mos  IM (Completed)        [name], MD [facility] [place] [phone] pager   [phone] cell 01/23/2017, 10:38 AM

## 2017-02-01 ENCOUNTER — Ambulatory Visit (INDEPENDENT_AMBULATORY_CARE_PROVIDER_SITE_OTHER): Payer: Medicare HMO | Admitting: Internal Medicine

## 2017-02-01 ENCOUNTER — Encounter: Payer: Self-pay | Admitting: Internal Medicine

## 2017-02-01 DIAGNOSIS — R1013 Epigastric pain: Secondary | ICD-10-CM | POA: Diagnosis not present

## 2017-02-01 DIAGNOSIS — K219 Gastro-esophageal reflux disease without esophagitis: Secondary | ICD-10-CM | POA: Diagnosis not present

## 2017-02-01 DIAGNOSIS — G8929 Other chronic pain: Secondary | ICD-10-CM | POA: Diagnosis not present

## 2017-02-01 MED ORDER — CLARITHROMYCIN 500 MG PO TABS: 500.0000 mg | ORAL_TABLET | Freq: Two times a day (BID) | ORAL | 0 refills | Status: DC

## 2017-02-01 MED ORDER — OMEPRAZOLE 40 MG PO CPDR: 40.0000 mg | DELAYED_RELEASE_CAPSULE | Freq: Two times a day (BID) | ORAL | 0 refills | Status: DC

## 2017-02-01 MED ORDER — AMOXICILLIN 500 MG PO TABS: 1000.0000 mg | ORAL_TABLET | Freq: Two times a day (BID) | ORAL | 0 refills | Status: DC

## 2017-02-01 NOTE — Patient Instructions (Signed)
It was nice seeing you again today Jeffrey [name]!  Below you will find information regarding H.pylori. Please begin taking the three medications for the next 14 days: - Clarithromycin one tablet (500 mg) twice a day - Amoxicillin two tablets (1000 mg) twice a day - Omeprazole one tablet (40 mg) twice a day  If you are still having the same pains after completing the two weeks of medication, please schedule another appointment.   If you have any questions or concerns, please feel free to call the clinic.   Be well,  Dr. Avon Gully   Helicobacter Pylori Infection Helicobacter pylori infection is an infection in the stomach that is caused by the Helicobacter pylori (H. pylori) bacteria. This type of bacteria often lives in the lining of the stomach. The infection can cause ulcers and irritation (gastritis) in some people. It is the most common cause of ulcers in the stomach (gastric ulcer) and in the upper part of the intestine (duodenal ulcer). Having this infection may also increase the risk of stomach cancer and a type of white blood cell cancer (lymphoma) that affects the stomach. What are the causes? H. pylori is a type of bacteria that is often found in the stomachs of healthy people. The bacteria may be passed from person to person through contact with stool or saliva. It is not known why some people develop ulcers, gastritis, or cancer from the infection. What increases the risk? This condition is more likely to develop in people who:  Have family members with the infection.  Live with many other people, such as in a dormitory.  Are of African, Hispanic, or Asian descent.  What are the signs or symptoms? Most people with this infection do not have symptoms. If you do have symptoms, they may include:  Heartburn.  Stomach pain.  Nausea.  Vomiting.  Blood-tinged vomit.  Loss of appetite.  Bad breath.  How is this diagnosed? This condition may be diagnosed based on your  symptoms, a physical exam, and various tests. Tests may include:  Blood tests or stool tests to check for the proteins (antibodies) that your body may produce in response to the bacteria. These tests are the best way to confirm the diagnosis.  A breath test to check for the type of gas that the H. pylori bacteria release after breaking down a substance called urea. For the test, you are asked to drink urea. This test is often done after treatment in order to find out if the treatment worked.  A procedure in which a thin, flexible tube with a tiny camera at the end is placed into your stomach and upper intestine (upper endoscopy). Your health care provider may also take tissue samples (biopsy) to test for H. pylori and cancer.  How is this treated? Treatment for this condition usually involves taking a combination of medicines (triple therapy) for a couple of weeks. Triple therapy includes one medicine to reduce the acid in your stomach and two types of antibiotic medicines. Many drug combinations have been approved for treatment. Treatment usually kills the H. pylori and reduces your risk of cancer. You may need to be tested for H. pylori again after treatment. In some cases, the treatment may need to be repeated. Follow these instructions at home:  Take over-the-counter and prescription medicines only as told by your health care provider.  Take your antibiotics as told by your health care provider. Do not stop taking the antibiotics even if you start to feel better.  You can do all your usual activities and eat what you usually do.  Take steps to prevent future infections: ? Wash your hands often. ? Make sure the food you eat has been properly prepared. ? Drink water only from clean sources.  Keep all follow-up visits as told by your health care provider. This is important. Contact a health care provider if:  Your symptoms do not get better.  Your symptoms return after treatment. This  information is not intended to replace advice given to you by your health care provider. Make sure you discuss any questions you have with your health care provider. Document Released: 08/16/2015 Document Revised: 09/30/2015 Document Reviewed: 05/06/2014 Elsevier Interactive Patient Education  2018 [employer].

## 2017-02-01 NOTE — Progress Notes (Signed)
   Subjective:   Patient: Jeffrey [name]       Birthdate: 1945-06-22       MRN: [id number]      HPI  Jeffrey [name] is a 71 y.o. male presenting for abdominal pain.   Abdominal pain Began 6 months ago. Located primarily in sternal region of upper quadrants. Patient was prescribed omeprazole 41m BID three months ago but this has not provided any relief. Pain is worse after eating but is present prior to eating as well. Appetite has been normal. Also reports some difficulty swallowing solid foods and medication. Does not regurgitate, but has to try a couple times to swallow pills, which was not previously an issue for him. Denies vomiting or nausea. Also endorses reflux symptoms, including burning in midsternal region. Says that pain is worse when eating "coarse" foods, such as meats. Denies diarrhea, constipation.   Smoking status reviewed. Patient is never smoker.   Review of Systems See HPI.     Objective:  Physical Exam  Constitutional: He is oriented to person, place, and time and well-developed, well-nourished, and in no distress.  HENT:  Head: Normocephalic and atraumatic.  Nose: Nose normal.  Mouth/Throat: Oropharynx is clear and moist. No oropharyngeal exudate.  Eyes: Pupils are equal, round, and reactive to light. Conjunctivae and EOM are normal. Right eye exhibits no discharge. Left eye exhibits no discharge.  Cardiovascular: Normal rate, regular rhythm and normal heart sounds.   No murmur heard. Pulmonary/Chest: Effort normal. No respiratory distress.  Abdominal: Soft. Bowel sounds are normal. He exhibits no distension. There is no tenderness. There is no rebound and no guarding.  Neurological: He is alert and oriented to person, place, and time.  Skin: Skin is warm and dry.  Psychiatric: Affect and judgment normal.      Assessment & Plan:  Abdominal pain, chronic, epigastric Ongoing for past 6 months. No improvement with high dose PPI (omeprazole 467mBID). Also with  dysphagia. Patient does have history of esophageal stricture for which he had dilatation performed, which could be returning and contributing to dysphagia. Given no improvement with PPI after 3 months, concern for H. Pylori. No weight loss or red flags. Will treat with triple therapy then f/u if no improvement.  - Clarithromycin, azmoxicillin, and omeprazole BID x14d - F/u if no improvement - If dysphagia continues or worsening, GI referral (patient referred in the spring but unsure if he went to appt or not)  AbAdin HectorMD, MPH PGY-3 [facility] Pager [phone]

## 2017-02-02 ENCOUNTER — Encounter: Payer: Self-pay | Admitting: Internal Medicine

## 2017-02-02 DIAGNOSIS — R311 Benign essential microscopic hematuria: Secondary | ICD-10-CM | POA: Diagnosis not present

## 2017-02-02 DIAGNOSIS — G8929 Other chronic pain: Secondary | ICD-10-CM | POA: Insufficient documentation

## 2017-02-02 DIAGNOSIS — N5201 Erectile dysfunction due to arterial insufficiency: Secondary | ICD-10-CM | POA: Diagnosis not present

## 2017-02-02 DIAGNOSIS — N401 Enlarged prostate with lower urinary tract symptoms: Secondary | ICD-10-CM | POA: Diagnosis not present

## 2017-02-02 DIAGNOSIS — R1013 Epigastric pain: Secondary | ICD-10-CM

## 2017-02-02 DIAGNOSIS — R350 Frequency of micturition: Secondary | ICD-10-CM | POA: Diagnosis not present

## 2017-02-02 NOTE — Assessment & Plan Note (Signed)
Ongoing for past 6 months. No improvement with high dose PPI (omeprazole 70m BID). Also with dysphagia. Patient does have history of esophageal stricture for which he had dilatation performed, which could be returning and contributing to dysphagia. Given no improvement with PPI after 3 months, concern for H. Pylori. No weight loss or red flags. Will treat with triple therapy then f/u if no improvement.  - Clarithromycin, azmoxicillin, and omeprazole BID x14d - F/u if no improvement - If dysphagia continues or worsening, GI referral (patient referred in the spring but unsure if he went to appt or not)

## 2017-02-05 ENCOUNTER — Telehealth: Payer: Self-pay | Admitting: *Deleted

## 2017-02-05 NOTE — Telephone Encounter (Signed)
Pt states that the pharmacy cant fill clarithromycin because it has an interaction with his flomax.   States that they faxed [place] last week, but I informed that our fax machine and telephones were not working properly last week.   He was able to pick up the Amoxicillin, but is still having pain.  Advised I would send a message to MD now.  Fleeger, Salome Spotted, CMA

## 2017-02-06 ENCOUNTER — Telehealth: Payer: Self-pay | Admitting: Internal Medicine

## 2017-02-06 MED ORDER — LEVOFLOXACIN 500 MG PO TABS: 500.0000 mg | ORAL_TABLET | Freq: Every day | ORAL | 0 refills | Status: DC

## 2017-02-06 NOTE — Telephone Encounter (Signed)
Called patient regarding H.pylori treatment. As patient cannot take clarithromycin due to interaction with Flomax, will try levaquin triple therapy instead. Explained that patient will take levaquin in place of clarithromycin once daily, and will take amoxicillin and omeprazole twice daily as explained before. Patient has not started taking amoxicillin yet so no additional tablets prescribed. Prescription for levaquin sent to pharmacy and patient instructed to start taking today. Patient to call if no improvement in symptoms by end of 14d course.   Adin Hector, MD, MPH PGY-3 [facility] Pager [phone]

## 2017-02-07 NOTE — Telephone Encounter (Signed)
See telephone note created by Dr. Avon Gully.  Thalia Turkington, Salome Spotted, CMA

## 2017-03-05 ENCOUNTER — Other Ambulatory Visit: Payer: Self-pay | Admitting: Internal Medicine

## 2017-03-05 ENCOUNTER — Other Ambulatory Visit: Payer: Self-pay | Admitting: *Deleted

## 2017-03-05 DIAGNOSIS — N401 Enlarged prostate with lower urinary tract symptoms: Principal | ICD-10-CM

## 2017-03-05 DIAGNOSIS — N138 Other obstructive and reflux uropathy: Secondary | ICD-10-CM

## 2017-03-05 DIAGNOSIS — K219 Gastro-esophageal reflux disease without esophagitis: Secondary | ICD-10-CM

## 2017-03-05 MED ORDER — OMEPRAZOLE 40 MG PO CPDR: 40.0000 mg | DELAYED_RELEASE_CAPSULE | Freq: Two times a day (BID) | ORAL | 0 refills | Status: DC

## 2017-03-05 NOTE — Telephone Encounter (Signed)
Patient left message on nurse line requesting refill on omeprazole.

## 2017-03-26 ENCOUNTER — Telehealth: Payer: Self-pay | Admitting: *Deleted

## 2017-03-26 DIAGNOSIS — K219 Gastro-esophageal reflux disease without esophagitis: Secondary | ICD-10-CM

## 2017-03-26 NOTE — Telephone Encounter (Signed)
Patient requesting refill on omeprazole 40 mg once daily. States he's been on this for 15 years and does really well. Thought PCP had discontinued it as there was only a 14 day supply sent to pharmacy on 03/04/17 with no refills. Now with GERD symptoms after being off it for few days. Hubbard Hartshorn, RN, BSN

## 2017-03-28 MED ORDER — OMEPRAZOLE 40 MG PO CPDR: 40.0000 mg | DELAYED_RELEASE_CAPSULE | Freq: Two times a day (BID) | ORAL | 3 refills | Status: DC

## 2017-04-17 ENCOUNTER — Other Ambulatory Visit: Payer: Self-pay | Admitting: Infectious Disease

## 2017-04-17 ENCOUNTER — Other Ambulatory Visit: Payer: Self-pay | Admitting: Internal Medicine

## 2017-04-17 DIAGNOSIS — B20 Human immunodeficiency virus [HIV] disease: Secondary | ICD-10-CM

## 2017-05-03 DIAGNOSIS — Z0100 Encounter for examination of eyes and vision without abnormal findings: Secondary | ICD-10-CM | POA: Diagnosis not present

## 2017-06-05 ENCOUNTER — Encounter: Payer: Self-pay | Admitting: Internal Medicine

## 2017-06-05 ENCOUNTER — Ambulatory Visit (INDEPENDENT_AMBULATORY_CARE_PROVIDER_SITE_OTHER): Payer: Medicare HMO | Admitting: Internal Medicine

## 2017-06-05 VITALS — BP 128/72 | HR 73 | Temp 97.7°F | Ht 70.0 in | Wt 169.6 lb

## 2017-06-05 DIAGNOSIS — R1310 Dysphagia, unspecified: Secondary | ICD-10-CM

## 2017-06-05 DIAGNOSIS — L299 Pruritus, unspecified: Secondary | ICD-10-CM

## 2017-06-05 NOTE — Assessment & Plan Note (Signed)
No improvement since seeing GI in 10/2016. EGD performed at that time. Since GI has already started work-up for this, feel that it is best for patient to continue to follow up with Dr. Havery Moros for this issue. Patient amenable to this. Provided [facility] phone number so patient can schedule appt. Referral not necessary since patient seen within past year.

## 2017-06-05 NOTE — Patient Instructions (Addendum)
It was nice seeing you again today Jeffrey [name]!  I have placed a referral to dermatology for you to evaluate your scalp. They will call you with the date and time of this appointment.   Since GI has already seen you for your difficulty swallowing, I think it would be best to continue to follow up with them for that issue, since they are able to do more diagnostic procedures than we are able to do here. Please call [facility] at [phone] to schedule another appointment with Dr. Havery Moros or his NP, [name].   If you have any questions or concerns, please feel free to call the clinic.   Be well,  Dr. Avon Gully

## 2017-06-05 NOTE — Assessment & Plan Note (Signed)
Previously diagnosed with seborrheic dermatitis, though lack of flaking or scaling argues against this diagnosis. No improvement with fluconazole shampoo in 2016, and no improvement with any OTC products since then. Patient does have history of HIV, though has been very well-controlled and undetectable for many years, so do not feel that this is playing a role in his symptoms. Offered patient trial of oral itraconazole since topical azoles have failed, and/or referral to dermatology. Patient preferring referral to dermatology at this time. Referral to derm placed.

## 2017-06-05 NOTE — Progress Notes (Signed)
   Subjective:   Patient: Jeffrey [name]       Birthdate: 03-14-1946       MRN: [id number]      HPI  Jeffrey [name] is a 72 y.o. male presenting for scalp itching and dysphagia.   Scalp itching Ongoing for about 5 years, though recently worsening. Has been diagnosed with seborrheic dermatitis in the past and prescribed fluconazole shampoo. Says this was not particularly helpful and also cost $500 and was not covered by his insurance. Has tried multiple OTC products, including those recommended by his pharmacist, with no improvement in symptoms. Denies any flaking or redness of his scalp, just itching. Has never seen dermatology for this issue, but thinks this may be helpful.   Dysphagia Ongoing issue which has required dilatation in the past. Last seen by [facility] (Dr. Havery Moros) in June 2018. EGD performed at that time and biopsies taken. Patient has not scheduled a f/u appt with GI, but says that symptoms are worsening. Describes sensation of food sticking in his throat, primarily meats and some fruits like apples. Nothing improves symptoms. Did complete course of abx in summer for possible H.pylori. Says symptoms improved temporarily after completing abx, however have returned. Recent 7lb weight loss, though this was intentional.   Smoking status reviewed. Patient is never smoker.   Review of Systems See HPI.     Objective:  Physical Exam  Constitutional: He is oriented to person, place, and time.  Pleasant male in NAD  HENT:  Head: Normocephalic and atraumatic.  Mouth/Throat: Oropharynx is clear and moist. No oropharyngeal exudate.  Pulmonary/Chest: Effort normal. No respiratory distress.  Neurological: He is alert and oriented to person, place, and time.  Skin:  No scalp flaking, scaling, erythema. Skin warm and dry.   Psychiatric: Affect and judgment normal.   Assessment & Plan:  Dysphagia No improvement since seeing GI in 10/2016. EGD performed at that time. Since GI has  already started work-up for this, feel that it is best for patient to continue to follow up with Dr. Havery Moros for this issue. Patient amenable to this. Provided [facility] phone number so patient can schedule appt. Referral not necessary since patient seen within past year.   Scalp itch Previously diagnosed with seborrheic dermatitis, though lack of flaking or scaling argues against this diagnosis. No improvement with fluconazole shampoo in 2016, and no improvement with any OTC products since then. Patient does have history of HIV, though has been very well-controlled and undetectable for many years, so do not feel that this is playing a role in his symptoms. Offered patient trial of oral itraconazole since topical azoles have failed, and/or referral to dermatology. Patient preferring referral to dermatology at this time. Referral to derm placed.   Precepted with Dr. Nani Ravens.   Adin Hector, MD, MPH PGY-3 [facility] Pager [phone]

## 2017-06-20 ENCOUNTER — Ambulatory Visit (INDEPENDENT_AMBULATORY_CARE_PROVIDER_SITE_OTHER): Payer: Medicare HMO | Admitting: Nurse Practitioner

## 2017-06-20 ENCOUNTER — Encounter: Payer: Self-pay | Admitting: Nurse Practitioner

## 2017-06-20 VITALS — BP 110/70 | HR 72 | Ht 70.0 in | Wt 168.0 lb

## 2017-06-20 DIAGNOSIS — K219 Gastro-esophageal reflux disease without esophagitis: Secondary | ICD-10-CM

## 2017-06-20 DIAGNOSIS — R1310 Dysphagia, unspecified: Secondary | ICD-10-CM

## 2017-06-20 MED ORDER — SUCRALFATE 1 G PO TABS: 1.0000 g | ORAL_TABLET | Freq: Three times a day (TID) | ORAL | 2 refills | Status: DC

## 2017-06-20 MED ORDER — OMEPRAZOLE 40 MG PO CPDR: 40.0000 mg | DELAYED_RELEASE_CAPSULE | Freq: Two times a day (BID) | ORAL | 3 refills | Status: DC

## 2017-06-20 NOTE — Progress Notes (Signed)
      IMPRESSION and PLAN:    3. 72 yo male with GERD / solid food dysphagia. -continue omeprazole 40 mg twice a day before meals. -trial of Carafate before meals and at bedtime as needed for breakthrough heartburn -Antireflux measures discussed and there is room for improvement.Marland Kitchen GERD literature given -will talk with Dr. Havery Moros to see if he thinks repeat esophageal dilation would be of benefit (last dilation was June, patient prefers to hold off on repeat unless necessary) -consider esophageal manometry.  Advised patient to eat small bites, chew well with liquids in between bites to avoid food impaction. -recommended wedge pillow   2. HIV under control   HPI:    Chief Complaint: Heartburn and swallowing problems again   Patient is a 72 year old male known to Dr. Havery Moros. He had a screening colonoscopy March 2017 with removal of a small cecal polyp (not retrieved) and a small ascending colon tubular adenoma without HGD. Patient has HIV, on Triumeq and followed by Dr. Megan Salon. Jeffrey [name] was last seen June 2018 for evaluation of upper abdominal pain and GERD with dysphagia. Subsequent EGD showed mild gastritis . Gastric biopsies compatible with reactive gastropathy, no H. pylori. Esophageal biopsies suggested reflux. Empirical Savary dilation done.Marland Kitchen He did well until 6-8 weeks ago. Since then having having heartburn and solid food dysphagia .  Cuts food up fine and that helps but food still "gets lodged". Having recurrent regurgitation, especially in reclined position. He is taking Omeprazole 40 mg BID. Hasn't started any new medications. Doesn't really prop himself up at night and doesn't usually go to bed on any empty stomach. He had been eating a lot of ice cream lately.  Weight stable.    Past Medical History:  Diagnosis Date  . Colon polyps   . GERD (gastroesophageal reflux disease)   . HIV (human immunodeficiency virus infection) ([place]) 1999    Patient's surgical history,  family medical history, social history, medications and allergies were all reviewed in Epic    Review of systems:  No SOB. He has a chronic cough.   Physical Exam:     BP 110/70   Pulse 72   Ht 5' 10"  (1.778 m)   Wt 168 lb (76.2 kg)   BMI 24.11 kg/m   GENERAL: well developed white male in NAD PSYCH: :Pleasant, cooperative, normal affect EENT:  conjunctiva pink, mucous membranes moist, neck supple without masses CARDIAC:  RRR,  no peripheral edema PULM: Normal respiratory effort, lungs CTA bilaterally, no wheezing ABDOMEN:  Nondistended, soft, nontender. No obvious masses, no hepatomegaly,  normal bowel sounds SKIN:  turgor, no lesions seen Musculoskeletal:  Normal muscle tone, normal strength NEURO: Alert and oriented x 3, no focal neurologic deficits   [name] , NP 06/20/2017, 9:44 AM

## 2017-06-20 NOTE — Patient Instructions (Addendum)
If you are age 70 or older, your body mass index should be between 23-30. Your Body mass index is 24.11 kg/m. If this is out of the aforementioned range listed, please consider follow up with your Primary Care Provider.  If you are age 47 or younger, your body mass index should be between 19-25. Your Body mass index is 24.11 kg/m. If this is out of the aformentioned range listed, please consider follow up with your Primary Care Provider.   We have sent the following medications to your pharmacy for you to pick up at your convenience:  Carafate  1 gram before meals and at bedtime as needed #120, plus 2 refills   Continue Omeprazole. New prescription sent.  Try a Wedge pillow or raise head of mattress.   You have been given GERD literature.  Thank you for choosing me and [facility].   [name], NP

## 2017-06-21 ENCOUNTER — Encounter: Payer: Self-pay | Admitting: Nurse Practitioner

## 2017-06-22 NOTE — Progress Notes (Signed)
Agree with assessment and plan. His last EGD showed no obvious stenosis / stricture or Barrett's but empiric dilation performed. Did this help his dysphagia at the time and then it recurred, or has it just persisted? I think he likely warrants esophageal manometry to evaluate his dysphagia, and then also a 24 HR pH impedance study ON PPI, if he is still having reflux despite BID PPI. Can you help coordinate if needed?

## 2017-06-28 ENCOUNTER — Telehealth: Payer: Self-pay | Admitting: Nurse Practitioner

## 2017-06-28 NOTE — Telephone Encounter (Signed)
Patient just calling to touch base with Nevin Bloodgood and let her know medication seems to be working good.

## 2017-06-29 NOTE — Telephone Encounter (Signed)
Good news. If recurrent dysphagia then recommending manometry  / 24 hour impedence study

## 2017-07-24 ENCOUNTER — Other Ambulatory Visit: Payer: Medicare HMO

## 2017-07-24 DIAGNOSIS — B20 Human immunodeficiency virus [HIV] disease: Secondary | ICD-10-CM | POA: Diagnosis not present

## 2017-07-25 LAB — RPR: RPR: NONREACTIVE

## 2017-07-25 LAB — COMPREHENSIVE METABOLIC PANEL
AG RATIO: 1.4 (calc) (ref 1.0–2.5)
ALBUMIN MSPROF: 3.9 g/dL (ref 3.6–5.1)
ALKALINE PHOSPHATASE (APISO): 87 U/L (ref 40–115)
ALT: 18 U/L (ref 9–46)
AST: 18 U/L (ref 10–35)
BILIRUBIN TOTAL: 0.3 mg/dL (ref 0.2–1.2)
BUN/Creatinine Ratio: 16 (calc) (ref 6–22)
BUN: 26 mg/dL — ABNORMAL HIGH (ref 7–25)
CO2: 29 mmol/L (ref 20–32)
Calcium: 9.2 mg/dL (ref 8.6–10.3)
Chloride: 107 mmol/L (ref 98–110)
Creat: 1.65 mg/dL — ABNORMAL HIGH (ref 0.70–1.25)
Globulin: 2.7 g/dL (calc) (ref 1.9–3.7)
Glucose, Bld: 101 mg/dL — ABNORMAL HIGH (ref 65–99)
POTASSIUM: 4.1 mmol/L (ref 3.5–5.3)
Sodium: 141 mmol/L (ref 135–146)
Total Protein: 6.6 g/dL (ref 6.1–8.1)

## 2017-07-25 LAB — LIPID PANEL
CHOLESTEROL: 209 mg/dL — AB (ref <200)
HDL: 48 mg/dL (ref >40)
LDL Cholesterol (Calc): 125 mg/dL (calc) — ABNORMAL HIGH
Non-HDL Cholesterol (Calc): 161 mg/dL (calc) — ABNORMAL HIGH (ref <130)
TRIGLYCERIDES: 218 mg/dL — AB (ref <150)
Total CHOL/HDL Ratio: 4.4 (calc) (ref <5.0)

## 2017-07-25 LAB — T-HELPER CELL (CD4) - (RCID CLINIC ONLY)
CD4 % Helper T Cell: 18 % — ABNORMAL LOW (ref 33–55)
CD4 T Cell Abs: 320 /uL — ABNORMAL LOW (ref 400–2700)

## 2017-07-25 LAB — CBC
HEMATOCRIT: 40.2 % (ref 38.5–50.0)
Hemoglobin: 14.3 g/dL (ref 13.2–17.1)
MCH: 34.6 pg — ABNORMAL HIGH (ref 27.0–33.0)
MCHC: 35.6 g/dL (ref 32.0–36.0)
MCV: 97.3 fL (ref 80.0–100.0)
MPV: 9 fL (ref 7.5–12.5)
PLATELETS: 195 10*3/uL (ref 140–400)
RBC: 4.13 10*6/uL — ABNORMAL LOW (ref 4.20–5.80)
RDW: 12.9 % (ref 11.0–15.0)
WBC: 6.5 10*3/uL (ref 3.8–10.8)

## 2017-07-27 LAB — HIV-1 RNA QUANT-NO REFLEX-BLD
HIV 1 RNA Quant: <20 copies/mL — AB
HIV-1 RNA QUANT, LOG: DETECTED {Log_copies}/mL — AB

## 2017-08-07 ENCOUNTER — Ambulatory Visit (INDEPENDENT_AMBULATORY_CARE_PROVIDER_SITE_OTHER): Payer: Medicare HMO | Admitting: Internal Medicine

## 2017-08-07 ENCOUNTER — Other Ambulatory Visit: Payer: Self-pay

## 2017-08-07 DIAGNOSIS — I509 Heart failure, unspecified: Secondary | ICD-10-CM | POA: Diagnosis not present

## 2017-08-07 DIAGNOSIS — R1310 Dysphagia, unspecified: Secondary | ICD-10-CM | POA: Diagnosis not present

## 2017-08-07 DIAGNOSIS — R1319 Other dysphagia: Secondary | ICD-10-CM

## 2017-08-07 DIAGNOSIS — B20 Human immunodeficiency virus [HIV] disease: Secondary | ICD-10-CM | POA: Diagnosis not present

## 2017-08-07 NOTE — Progress Notes (Signed)
Patient Active Problem List   Diagnosis Date Noted  . Human immunodeficiency virus (HIV) disease ([place]) 02/27/2006    Priority: High  . Left ankle pain 08/07/2013    Priority: Medium  . Dyslipidemia 07/22/2012    Priority: Medium  . Erectile dysfunction 07/22/2012    Priority: Medium  . GERD 05/17/2010    Priority: Medium  . Dysphagia 03/08/2009    Priority: Medium  . Essential hypertension 02/27/2006    Priority: Medium  . Abdominal pain, chronic, epigastric 02/02/2017  . Pain in joint of right shoulder 07/07/2016  . Right shoulder pain 07/03/2016  . Cough 05/17/2016  . Scalp itch 01/06/2015  . Diastolic CHF with preserved left ventricular function, NYHA class 2 ([place]) 01/06/2015  . Prostatitis 10/15/2014  . Muscle spasm of back 10/15/2014  . Exertional dyspnea 09/30/2014  . Insomnia 07/22/2014  . BPH with obstruction/lower urinary tract symptoms 06/17/2014  . Screening for colon cancer 10/07/2013  . CHEST PAIN 05/17/2010  . CERVICAL LYMPHADENOPATHY 09/19/2007  . CUTANEOUS ERUPTIONS, DRUG-INDUCED 05/28/2006  . CHOLECYSTECTOMY, HX OF 05/28/2006  . ESOPHAGITIS 02/27/2006  . PNEUMONIA, HX OF 02/27/2006  . ARTHROSCOPY, KNEE, HX OF 02/27/2006    Patient's Medications  New Prescriptions   No medications on file  Previous Medications   ASPIRIN EC 81 MG TABLET    Take 1 tablet (81 mg total) by mouth daily.   FINASTERIDE (PROSCAR) 5 MG TABLET    Take 5 mg by mouth daily.   MULTIPLE VITAMINS-MINERALS (MULTIVITAMIN PO)    Take 1 tablet by mouth daily.   OMEPRAZOLE (PRILOSEC) 40 MG CAPSULE    Take 1 capsule (40 mg total) by mouth 2 (two) times daily.   SUCRALFATE (CARAFATE) 1 G TABLET    Take 1 tablet (1 g total) by mouth 4 (four) times daily -  with meals and at bedtime.   TAMSULOSIN (FLOMAX) 0.4 MG CAPS CAPSULE    TAKE TWO CAPSULES BY MOUTH ONCE DAILY   TRIUMEQ 600-50-300 MG TABLET    TAKE ONE TABLET BY MOUTH ONCE DAILY  Modified Medications   No medications on  file  Discontinued Medications   No medications on file    Subjective: Check is in for his routine HIV follow-up visit.  He has had no problems obtaining, taking or tolerating his Triumeq other than some difficulties swallowing the pill.  He has been having more problems with dysphagia with pills and solid food recently.  He was prescribed sucralfate recently in addition to his twice daily omeprazole.  He felt like it helped but his symptoms got worse just as soon as he stopped it.  He has now refilled it.  Although he has trouble swallowing his Triumeq and omeprazole he says that he is always able to get it down by drinking extra water.  He does not recall missing doses.  Review of Systems: Review of Systems  Constitutional: Negative for weight loss.  Gastrointestinal: Positive for heartburn. Negative for abdominal pain, diarrhea, nausea and vomiting.       As noted in HPI.    Past Medical History:  Diagnosis Date  . Colon polyps   . GERD (gastroesophageal reflux disease)   . HIV (human immunodeficiency virus infection) ([place]) 1999    Social History   Tobacco Use  . Smoking status: Never Smoker  . Smokeless tobacco: Former Systems developer    Types: Chew  . Tobacco comment: Rare use, about q 6 months  Substance  Use Topics  . Alcohol use: Yes    Comment: rare  . Drug use: No    Family History  Problem Relation Age of Onset  . Hypertension Mother   . Heart attack Mother 1  . Colon cancer Neg Hx   . Esophageal cancer Neg Hx   . Pancreatic cancer Neg Hx   . Stomach cancer Neg Hx   . Liver disease Neg Hx     Allergies  Allergen Reactions  . Baclofen     Back pain. Patient said it made him feel crazy  . Efavirenz     Rash  . Nevirapine     Rash    Health Maintenance  Topic Date Due  . INFLUENZA VACCINE  12/06/2017  . COLONOSCOPY  08/03/2020  . TETANUS/TDAP  01/05/2025  . Hepatitis C Screening  Completed  . PNA vac Low Risk Adult  Completed    Objective:  There were  no vitals filed for this visit. There is no height or weight on file to calculate BMI.  Physical Exam  Constitutional: He is oriented to person, place, and time.  He is in good spirits as usual.  Neurological: He is alert and oriented to person, place, and time.  Psychiatric: Mood and affect normal.    Lab Results Lab Results  Component Value Date   WBC 6.5 07/24/2017   HGB 14.3 07/24/2017   HCT 40.2 07/24/2017   MCV 97.3 07/24/2017   PLT 195 07/24/2017    Lab Results  Component Value Date   CREATININE 1.65 (H) 07/24/2017   BUN 26 (H) 07/24/2017   NA 141 07/24/2017   K 4.1 07/24/2017   CL 107 07/24/2017   CO2 29 07/24/2017    Lab Results  Component Value Date   ALT 18 07/24/2017   AST 18 07/24/2017   ALKPHOS 78 10/06/2016   BILITOT 0.3 07/24/2017    Lab Results  Component Value Date   CHOL 209 (H) 07/24/2017   HDL 48 07/24/2017   LDLCALC 125 (H) 07/24/2017   TRIG 218 (H) 07/24/2017   CHOLHDL 4.4 07/24/2017   Lab Results  Component Value Date   LABRPR NON-REACTIVE 07/24/2017   HIV 1 RNA Quant (copies/mL)  Date Value  07/24/2017 <20 DETECTED (A)  01/09/2017 <20 NOT DETECTED  07/06/2016 <20 DETECTED (A)   CD4 T Cell Abs (/uL)  Date Value  07/24/2017 320 (L)  01/09/2017 310 (L)  07/06/2016 430     Problem List Items Addressed This Visit      High   Human immunodeficiency virus (HIV) disease ([place])    His infection remains under excellent, long-term control.  His viral load has been undetectable since 2014.  He will continue Triumeq and follow-up after lab work in 6 months.      Relevant Orders   T-helper cell (CD4)- ([facility] clinic only)   HIV 1 RNA quant-no reflex-bld     Medium   Dysphagia    He has ongoing GERD symptoms and recurrent dysphagia.  He underwent esophageal dilatation of a GE junction stricture in 2011.  Repeat endoscopy was done last June.  Biopsy showed evidence of reflux.  He had no stricture but underwent empiric dilatation with  transient improvement in his dysphasia that has now recurred.  He may need esophageal manometry.  I encouraged him to follow-up with [name], his GI provider.           [name], MD [facility] [phone] pager   [phone] cell 08/07/2017, 11:04 AM

## 2017-08-07 NOTE — Assessment & Plan Note (Signed)
He has ongoing GERD symptoms and recurrent dysphagia.  He underwent esophageal dilatation of a GE junction stricture in 2011.  Repeat endoscopy was done last June.  Biopsy showed evidence of reflux.  He had no stricture but underwent empiric dilatation with transient improvement in his dysphasia that has now recurred.  He may need esophageal manometry.  I encouraged him to follow-up with [name], his GI provider.

## 2017-08-07 NOTE — Assessment & Plan Note (Signed)
His infection remains under excellent, long-term control.  His viral load has been undetectable since 2014.  He will continue Triumeq and follow-up after lab work in 6 months.

## 2017-08-09 ENCOUNTER — Telehealth: Payer: Self-pay | Admitting: Nurse Practitioner

## 2017-08-09 NOTE — Telephone Encounter (Signed)
Pt states he was given carafate and it worked well in the beginning but now he is starting to have issues with dysphagia again. Also reports that there is this discomfort that he has in his stomach in the mornings and after he is up for a while it goes away. Pt states the discomfort is above his belly button. Pt wants to know if there is something else available that will help other than the carafate. Please advise.

## 2017-08-14 NOTE — Telephone Encounter (Signed)
Jeffrey [name], can he follow up with me in office? thanks

## 2017-08-14 NOTE — Telephone Encounter (Signed)
Spoke with pt and he is aware of appt date and time.

## 2017-08-14 NOTE — Telephone Encounter (Signed)
Left message for pt to call back. Pt scheduled to see [name] NP 08/28/17@3pm .

## 2017-08-14 NOTE — Telephone Encounter (Signed)
Pt said he is returning your call [phone]

## 2017-08-28 ENCOUNTER — Other Ambulatory Visit (INDEPENDENT_AMBULATORY_CARE_PROVIDER_SITE_OTHER): Payer: Medicare HMO

## 2017-08-28 ENCOUNTER — Encounter: Payer: Self-pay | Admitting: Nurse Practitioner

## 2017-08-28 ENCOUNTER — Ambulatory Visit (INDEPENDENT_AMBULATORY_CARE_PROVIDER_SITE_OTHER): Payer: Medicare HMO | Admitting: Nurse Practitioner

## 2017-08-28 VITALS — BP 120/78 | HR 66 | Ht 70.0 in | Wt 171.2 lb

## 2017-08-28 DIAGNOSIS — R1310 Dysphagia, unspecified: Secondary | ICD-10-CM

## 2017-08-28 DIAGNOSIS — R1013 Epigastric pain: Secondary | ICD-10-CM | POA: Diagnosis not present

## 2017-08-28 LAB — COMPREHENSIVE METABOLIC PANEL
ALBUMIN: 4.1 g/dL (ref 3.5–5.2)
ALK PHOS: 76 U/L (ref 39–117)
ALT: 23 U/L (ref 0–53)
AST: 23 U/L (ref 0–37)
BUN: 26 mg/dL — ABNORMAL HIGH (ref 6–23)
CHLORIDE: 105 meq/L (ref 96–112)
CO2: 30 mEq/L (ref 19–32)
Calcium: 9.7 mg/dL (ref 8.4–10.5)
Creatinine, Ser: 1.56 mg/dL — ABNORMAL HIGH (ref 0.40–1.50)
GFR: 47.04 mL/min — AB (ref >60.00)
Glucose, Bld: 89 mg/dL (ref 70–99)
Potassium: 3.5 mEq/L (ref 3.5–5.1)
Sodium: 138 mEq/L (ref 135–145)
TOTAL PROTEIN: 6.9 g/dL (ref 6.0–8.3)
Total Bilirubin: 0.4 mg/dL (ref 0.2–1.2)

## 2017-08-28 LAB — LIPASE: LIPASE: 7 U/L — AB (ref 11.0–59.0)

## 2017-08-28 NOTE — Patient Instructions (Signed)
If you are age 70 or older, your body mass index should be between 23-30. Your Body mass index is 24.56 kg/m. If this is out of the aforementioned range listed, please consider follow up with your Primary Care Provider.  If you are age 10 or younger, your body mass index should be between 19-25. Your Body mass index is 24.56 kg/m. If this is out of the aformentioned range listed, please consider follow up with your Primary Care Provider.   You have been scheduled for an esophageal manometry at [facility] on 09/03/17 at 12:30 pm. Please arrive 30 minutes prior to your procedure for registration. You will need to go to outpatient registration (1st floor of the hospital) first. Make certain to bring your insurance cards as well as a complete list of medications.  Please remember the following:  1) Do not take any muscle relaxants, xanax (alprazolam) or ativan for 1 day prior to your     test as well as the day of the test.  2) Nothing to eat or drink after 12:00 midnight on the night before your test.  3) Hold all diabetic medications/insulin the morning of the test. You may eat and take             your medications after the test.  It will take at least 2 weeks to receive the results of this test from your physician. ------------------------------------------ ABOUT ESOPHAGEAL MANOMETRY Esophageal manometry (muh-NOM-uh-tree) is a test that gauges how well your esophagus works. Your esophagus is the long, muscular tube that connects your throat to your stomach. Esophageal manometry measures the rhythmic muscle contractions (peristalsis) that occur in your esophagus when you swallow. Esophageal manometry also measures the coordination and force exerted by the muscles of your esophagus.  During esophageal manometry, a thin, flexible tube (catheter) that contains sensors is passed through your nose, down your esophagus and into your stomach. Esophageal manometry can be helpful in diagnosing  some mostly uncommon disorders that affect your esophagus.  Why it's done Esophageal manometry is used to evaluate the movement (motility) of food through the esophagus and into the stomach. The test measures how well the circular bands of muscle (sphincters) at the top and bottom of your esophagus open and close, as well as the pressure, strength and pattern of the wave of esophageal muscle contractions that moves food along.  What you can expect Esophageal manometry is an outpatient procedure done without sedation. Most people tolerate it well. You may be asked to change into a hospital gown before the test starts.  During esophageal manometry  While you are sitting up, a member of your health care team sprays your throat with a numbing medication or puts numbing gel in your nose or both.  A catheter is guided through your nose into your esophagus. The catheter may be sheathed in a water-filled sleeve. It doesn't interfere with your breathing. However, your eyes may water, and you may gag. You may have a slight nosebleed from irritation.  After the catheter is in place, you may be asked to lie on your back on an exam table, or you may be asked to remain seated.  You then swallow small sips of water. As you do, a computer connected to the catheter records the pressure, strength and pattern of your esophageal muscle contractions.  During the test, you'll be asked to breathe slowly and smoothly, remain as still as possible, and swallow only when you're asked to do so.  A  member of your health care team may move the catheter down into your stomach while the catheter continues its measurements.  The catheter then is slowly withdrawn. The test usually lasts 20 to 30 minutes.  After esophageal manometry  When your esophageal manometry is complete, you may return to your normal activities  This test typically takes 30-45 minutes to  complete. _____________________________________________________________________________ Dennis Bast have been scheduled for a CT scan of the abdomen and pelvis at [place] ([address]---this is in the same building as Press [facility]).   You are scheduled on 09/06/17 at 10:30 am. You should arrive 15 minutes prior to your appointment time for registration. Please follow the written instructions below on the day of your exam:  WARNING: IF YOU ARE ALLERGIC TO IODINE/X-RAY DYE, PLEASE NOTIFY RADIOLOGY IMMEDIATELY AT [phone]! YOU WILL BE GIVEN A 13 HOUR PREMEDICATION PREP.  1) Do not eat or drink anything after 6:30 am (4 hours prior to your test) 2) You have been given 2 bottles of oral contrast to drink. The solution may taste better if refrigerated, but do NOT add ice or any other liquid to this solution. Shake well before drinking.    Drink 1 bottle of contrast @ 8:30 am (2 hours prior to your exam)  Drink 1 bottle of contrast @ 9:30 am (1 hour prior to your exam)  You may take any medications as prescribed with a small amount of water except for the following: Metformin, Glucophage, Glucovance, Avandamet, Riomet, Fortamet, Actoplus Met, Janumet, Glumetza or Metaglip. The above medications must be held the day of the exam AND 48 hours after the exam.  The purpose of you drinking the oral contrast is to aid in the visualization of your intestinal tract. The contrast solution may cause some diarrhea. Before your exam is started, you will be given a small amount of fluid to drink. Depending on your individual set of symptoms, you may also receive an intravenous injection of x-ray contrast/dye. Plan on being at [facility] for 30 minutes or longer, depending on the type of exam you are having performed.  This test typically takes 30-45 minutes to complete.  If you have any questions regarding your exam or if you need to reschedule, you may call the CT department at  [phone] between the hours of 8:00 am and 5:00 pm, Monday-Friday.  ________________________________________________________________________  Your provider has requested that you go to the basement level for lab work before leaving today. Press "B" on the elevator. The lab is located at the first door on the left as you exit the elevator. CMET LIPASE  STOP Carafate.  Thank you for choosing me and [facility].   [name], NP

## 2017-08-28 NOTE — Progress Notes (Signed)
      IMPRESSION and PLAN:    #1. 72 yo male with one year history of intermittent epigastric discomfort radiating through to his back with occasional nausea. No weight loss which is reassuring. On PPI. Carafate helped initially, not so much anymore.  1. CMET, lipase 2. If renal function normal will proceed with CT scan  3. Stop carafate. If epigastric pain worsens then if may have been helping more than realized and should restart it.   #2.  Recurrent / persistent solid food dysphagia. He had EGD with empirical dilation in June 2018 without much improvement in dysphagia.  -will arrange for esophageal manometry.       HPI:    Chief Complaint: upper abdominal pain and swallowing difficulty   Patient is a 72 yo male known to Dr Havery Moros.  Since around last June [name] has had dull, epigastric pain radiating through to his back. The pain occurs mainly in the mornings. Food doesn't really aggravate the pain but he sometimes has discomfort associated with persistent dysphagia. Pain actually seems to get better after eating and moving around. He has occasional am nausea but no vomiting. He is s/p very remote cholecystectomy. He takes a daily baby asa no other NSAIDS. He takes a daily PPI. He also takes Carafate but it doesn't seem to be helping as much anymore. EGD done in June 2018 for dysphagia but nothing found to account for the pain (nor dysphagia)  Weight is stable.   [name] continues to have problems swallowing solids. Water helps the food go down.  Esophagus empirically dilated June 2018 for   No strictures found but empirically dilated.   Review of systems:   No chest pain. No SOB. No urinary [place]    Past Medical History:  Diagnosis Date  . Colon polyps   . GERD (gastroesophageal reflux disease)   . HIV (human immunodeficiency virus infection) ([place]) 1999    Patient's surgical history, family medical history, social history, medications and allergies were all reviewed in Epic     Physical Exam:     BP 120/78   Pulse 66   Ht 5' 10"  (1.778 m)   Wt 171 lb 3.2 oz (77.7 kg)   BMI 24.56 kg/m   GENERAL: well developed white male in NAD PSYCH: :Pleasant, cooperative, normal affect EENT:  conjunctiva pink, mucous membranes moist, neck supple without masses CARDIAC:  RRR, no peripheral edema PULM: Normal respiratory effort, lungs CTA bilaterally, no wheezing ABDOMEN:  Nondistended, soft, nontender. No obvious masses, no hepatomegaly,  normal bowel sounds SKIN:  turgor, no lesions seen Musculoskeletal:  Normal muscle tone, normal strength NEURO: Alert and oriented x 3, no focal neurologic deficits   [name] , NP 08/28/2017, 2:46 PM

## 2017-08-31 ENCOUNTER — Encounter: Payer: Self-pay | Admitting: Nurse Practitioner

## 2017-09-03 ENCOUNTER — Encounter (HOSPITAL_COMMUNITY)
Admission: RE | Disposition: A | Discharge status: Home / Self Care | Payer: Self-pay | Source: Ambulatory Visit | Attending: Gastroenterology

## 2017-09-03 ENCOUNTER — Ambulatory Visit (HOSPITAL_COMMUNITY)
Admission: RE | Admit: 2017-09-03 | Discharge: 2017-09-03 | Disposition: A | Discharge status: Home / Self Care | Payer: Medicare HMO | Source: Ambulatory Visit | Attending: Gastroenterology | Admitting: Gastroenterology

## 2017-09-03 DIAGNOSIS — R1310 Dysphagia, unspecified: Secondary | ICD-10-CM | POA: Diagnosis not present

## 2017-09-03 HISTORY — PX: ESOPHAGEAL MANOMETRY: SHX5429

## 2017-09-03 SURGERY — MANOMETRY, ESOPHAGUS

## 2017-09-03 MED ORDER — LIDOCAINE VISCOUS 2 % MT SOLN
OROMUCOSAL | Status: AC
  Filled 2017-09-03: qty 15

## 2017-09-03 SURGICAL SUPPLY — 2 items
FACESHIELD LNG OPTICON STERILE (SAFETY) IMPLANT
GLOVE BIO SURGEON STRL SZ8 (GLOVE) ×6 IMPLANT

## 2017-09-03 NOTE — Progress Notes (Signed)
Esophageal manometry performed per protocol.  Patient tolerated procedure without complications.  Dr. Silverio Decamp to interpret results.

## 2017-09-03 NOTE — Progress Notes (Signed)
Agree with assessment and plan as outlined.  

## 2017-09-04 ENCOUNTER — Encounter (HOSPITAL_COMMUNITY): Payer: Self-pay | Admitting: Gastroenterology

## 2017-09-06 ENCOUNTER — Ambulatory Visit (INDEPENDENT_AMBULATORY_CARE_PROVIDER_SITE_OTHER)
Admission: RE | Admit: 2017-09-06 | Discharge: 2017-09-06 | Disposition: A | Discharge status: Home / Self Care | Payer: Medicare HMO | Source: Ambulatory Visit | Attending: Nurse Practitioner | Admitting: Nurse Practitioner

## 2017-09-06 DIAGNOSIS — R109 Unspecified abdominal pain: Secondary | ICD-10-CM | POA: Diagnosis not present

## 2017-09-06 DIAGNOSIS — R1310 Dysphagia, unspecified: Secondary | ICD-10-CM

## 2017-09-06 DIAGNOSIS — R1013 Epigastric pain: Secondary | ICD-10-CM

## 2017-09-06 MED ORDER — IOPAMIDOL (ISOVUE-300) INJECTION 61%
100.0000 mL | Freq: Once | INTRAVENOUS | Status: AC | PRN
  Administered 2017-09-06: 100 mL via INTRAVENOUS

## 2017-09-17 ENCOUNTER — Telehealth: Payer: Self-pay | Admitting: Gastroenterology

## 2017-09-17 NOTE — Telephone Encounter (Signed)
[employer] and spoke to [place].  There is no referrals required under this plan.  Ref# [id number].  Called patient and gave him this information.

## 2017-09-17 NOTE — Telephone Encounter (Signed)
I am not sure what they are asking for as I did not know a referral was needed for labs. I did try to call them but was not able to get through. Colletta [place] is the one that ordered these and saw the patient on 08/28/17. Routing this to Center.

## 2017-11-02 ENCOUNTER — Encounter (INDEPENDENT_AMBULATORY_CARE_PROVIDER_SITE_OTHER): Payer: Self-pay

## 2017-11-02 ENCOUNTER — Encounter: Payer: Self-pay | Admitting: Gastroenterology

## 2017-11-02 ENCOUNTER — Ambulatory Visit (INDEPENDENT_AMBULATORY_CARE_PROVIDER_SITE_OTHER): Payer: Medicare HMO | Admitting: Gastroenterology

## 2017-11-02 VITALS — BP 140/82 | HR 76 | Ht 70.0 in | Wt 166.2 lb

## 2017-11-02 DIAGNOSIS — K219 Gastro-esophageal reflux disease without esophagitis: Secondary | ICD-10-CM | POA: Diagnosis not present

## 2017-11-02 DIAGNOSIS — R1310 Dysphagia, unspecified: Secondary | ICD-10-CM | POA: Diagnosis not present

## 2017-11-02 DIAGNOSIS — R109 Unspecified abdominal pain: Secondary | ICD-10-CM

## 2017-11-02 DIAGNOSIS — R1084 Generalized abdominal pain: Secondary | ICD-10-CM | POA: Diagnosis not present

## 2017-11-02 MED ORDER — PANTOPRAZOLE SODIUM 40 MG PO TBEC: 40.0000 mg | DELAYED_RELEASE_TABLET | Freq: Two times a day (BID) | ORAL | 5 refills | Status: DC

## 2017-11-02 MED ORDER — SUCRALFATE 1 GM/10ML PO SUSP: 1.0000 g | Freq: Four times a day (QID) | ORAL | 1 refills | Status: DC | PRN

## 2017-11-02 MED ORDER — DICYCLOMINE HCL 10 MG PO CAPS: 10.0000 mg | ORAL_CAPSULE | Freq: Three times a day (TID) | ORAL | 3 refills | Status: DC | PRN

## 2017-11-02 NOTE — Patient Instructions (Addendum)
If you are age 70 or older, your body mass index should be between 23-30. Your Body mass index is 23.85 kg/m. If this is out of the aforementioned range listed, please consider follow up with your Primary Care Provider.  If you are age 52 or younger, your body mass index should be between 19-25. Your Body mass index is 23.85 kg/m. If this is out of the aformentioned range listed, please consider follow up with your Primary Care Provider.   You have been scheduled for a Barium Esophogram at [facility] (1st floor of the hospital) on Wed, 11-21-17 at 11:00am. Please arrive 15 minutes prior to your appointment for registration. Make certain not to have anything to eat or drink 3 hours prior to your test. If you need to reschedule for any reason, please contact radiology at [phone] to do so. __________________________________________________________________ A barium swallow is an examination that concentrates on views of the esophagus. This tends to be a double contrast exam (barium and two liquids which, when combined, create a gas to distend the wall of the oesophagus) or single contrast (non-ionic iodine based). The study is usually tailored to your symptoms so a good history is essential. Attention is paid during the study to the form, structure and configuration of the esophagus, looking for functional disorders (such as aspiration, dysphagia, achalasia, motility and reflux) EXAMINATION You may be asked to change into a gown, depending on the type of swallow being performed. A radiologist and radiographer will perform the procedure. The radiologist will advise you of the type of contrast selected for your procedure and direct you during the exam. You will be asked to stand, sit or lie in several different positions and to hold a small amount of fluid in your mouth before being asked to swallow while the imaging is performed .In some instances you may be asked to swallow barium coated  marshmallows to assess the motility of a solid food bolus. The exam can be recorded as a digital or video fluoroscopy procedure. POST PROCEDURE It will take 1-2 days for the barium to pass through your system. To facilitate this, it is important, unless otherwise directed, to increase your fluids for the next 24-48hrs and to resume your normal diet.  This test typically takes about 30 minutes to perform. __________________________________________________________________________   Discontinue Omeprazole.  We have sent the following medications to your pharmacy for you to pick up at your convenience:  Pantoprazole 40 mg : Take twice a day Carafate 55m: Take every 6 hours as needed Bentyl 10 mg: Take 1 to 2 tablets every 8 hours as needed  Thank you for entrusting me with your care and for choosing [facility] Cellar

## 2017-11-02 NOTE — Progress Notes (Signed)
HPI :  72 y/o male with well controlled HIV here for a follow up visit. He has a history of reflux, dysphagia, and abdominal pain.   He endorses longstanding reflux for > 20 years. He has been on omeprazole 24m BID for a long time. This had worked well for his reflux symptoms, which is mostly regurgitation and pyrosis, but not working as well as it used to. He is having some nocturnal symptoms which bother him frequently. He sleeps with head of bed elevated and tries not to eat anything a few hours prior to bedtime. He can wake up with a "fullness" in his chest / throat, which resolves when he is upright and starts his day. He has also been having some periodic postprandial epigastric pain. Prior EGD in 2018 showed no evidence of Barrett's or esophagitis. He has some occasional nausea but no vomiting. He denies any NSAID use.   He also endorses longstanding dysphagia to solids. EGD in 2018 showed no stenosis / stricture, and biopsies rule out EoE. He was empirically dilated to 190mwhich he states provided very short lived relief. Esophageal manometry done 08/2017 which did not show any evidence of dysmotility, but did show hypotensive LES. He continues to be bothered by dysphagia.  He otherwise endorses some lower mid abdominal pain which has been bothering him for several months. This can occur after he eats at times. He also thinks it can be positional and worsens with bending over or lifting something. No associated vomiting. Regular bowel habits roughly 2-3 BMs/day. No diarrhea. No blood in the stools.   Prior workup: Esophageal manometry 09/03/2017 - low resting LES pressure with appropriate relaxation, no evidence of dysmotility EGD 10/16/2016 - normal esophagus, empiric dilation to 1868mbiopsies to rule out EoE, gastritis noted - H pylori negative, no EoE CT scan 09/06/2017 - s/p cholecystectomy, bilateral inguinal hernia, small periumbilical hernia, no pathology, normal pancreas Colonoscopy  08/04/2015 - 2 small polyps, internal hemorrhoids - adenoma   Past Medical History:  Diagnosis Date  . Colon polyps   . GERD (gastroesophageal reflux disease)   . [place]     Past Surgical History:  Procedure Laterality Date  . CHOLECYSTECTOMY  1982  . ESOPHAGEAL MANOMETRY N/A 09/03/2017   Procedure: ESOPHAGEAL MANOMETRY (EM);  Surgeon: NanMauri PoleD;  Location: [facility];  Service: Endoscopy;  Laterality: N/A;  . KNEE SURGERY     LEFT KNEE  . TONSILLECTOMY     Family History  Problem Relation Age of Onset  . Hypertension Mother   . Heart attack Mother 89 10 Colon cancer Neg Hx   . Esophageal cancer Neg Hx   . Pancreatic cancer Neg Hx   . Stomach cancer Neg Hx   . Liver disease Neg Hx    Social History   Tobacco Use  . Smoking status: Never Smoker  . Smokeless tobacco: Former UseSystems developer Types: Chew  . Tobacco comment: Rare use, about q 6 months  Substance Use Topics  . Alcohol use: Yes    Comment: rare  . Drug use: No   Current Outpatient Medications  Medication Sig Dispense Refill  . aspirin EC 81 MG tablet Take 1 tablet (81 mg total) by mouth daily.    . finasteride (PROSCAR) 5 MG tablet Take 5 mg by mouth daily.    . Multiple Vitamins-Minerals (MULTIVITAMIN PO) Take 1 tablet by mouth daily.    . oMarland Kitcheneprazole (PRILOSEC) 40  MG capsule Take 1 capsule (40 mg total) by mouth 2 (two) times daily. 180 capsule 3  . tamsulosin (FLOMAX) 0.4 MG CAPS capsule TAKE TWO CAPSULES BY MOUTH ONCE DAILY 180 capsule 3  . TRIUMEQ 600-50-300 MG tablet TAKE ONE TABLET BY MOUTH ONCE DAILY 30 tablet 5   No current facility-administered medications for this visit.    Allergies  Allergen Reactions  . Baclofen     Back pain. Patient said it made him feel crazy  . Efavirenz     Rash  . Nevirapine     Rash     Review of Systems: All systems reviewed and negative except where noted in HPI.   Lab Results  Component Value Date    WBC 6.5 07/24/2017   HGB 14.3 07/24/2017   HCT 40.2 07/24/2017   MCV 97.3 07/24/2017   PLT 195 07/24/2017    Lab Results  Component Value Date   CREATININE 1.56 (H) 08/28/2017   BUN 26 (H) 08/28/2017   NA 138 08/28/2017   K 3.5 08/28/2017   CL 105 08/28/2017   CO2 30 08/28/2017    Lab Results  Component Value Date   ALT 23 08/28/2017   AST 23 08/28/2017   ALKPHOS 76 08/28/2017   BILITOT 0.4 08/28/2017     Physical Exam: BP 140/82   Pulse 76   Ht 5' 10"  (1.778 m)   Wt 166 lb 4 oz (75.4 kg)   BMI 23.85 kg/m  Constitutional: Pleasant,well-developed, male in no acute distress. HEENT: Normocephalic and atraumatic. Conjunctivae are normal. No scleral icterus. Neck supple.  Cardiovascular: Normal rate, regular rhythm.  Pulmonary/chest: Effort normal and breath sounds normal. No wheezing, rales or rhonchi. Abdominal: Soft, nondistended, periumbilical hernia, diastasis recti - some tenderness to palpation around the umbilicus and inferior to it I midline lower abdomen. There are no masses palpable. No hepatomegaly. Extremities: no edema Lymphadenopathy: No cervical adenopathy noted. Neurological: Alert and oriented to person place and time. Skin: Skin is warm and dry. No rashes noted. Psychiatric: Normal mood and affect. Behavior is normal.   ASSESSMENT AND PLAN: 72 year old male with history as outlined above, here for reassessment following issues:  GERD - long-standing symptoms of reflux. On long-standing omeprazole which does not appear to be working too well for him any longer. Prior EGD without Barrett's esophagus about a year ago. I discussed options with him. While PPIs have risks his symptoms are poorly controlled despite a high dose. We will try him on a different PPI to see if he has a better response, recommend switching to pantoprazole 40 mg twice daily. He will take this 30-60 minutes prior to a meal. If his symptoms persist despite this we'll then consider pH  impedance testing to see if he is having breakthrough acid reflux, nonacid reflux, functional heartburn, hypersensitive esophagus, etc. He wants to avoid pH test possible. Will await his course. We'll also give him some Carafate to take 10 mL every 6 hours as needed for breakthrough to see if this helps as well. He agreed with the plan.   Dysphagia - prior EGD with empiric dilation provided minimal benefit. Prior manometry without any evidence of motility disorder. I discussed options with him. I offered him a barium swallow with tablet study to see if we can localize where his dysphagia is occurring, but given he's had a dilation to 37m already, I doubt he has any significant stenosis. As this symptom continues to bother him routinely, will proceed with barium study,  will contact him with the results.   Abdominal pain / umbilical hernia - unclear if this is related to his hernia or not, has some periumbilical pain but also some midline lower abdominal pain. I reassured him I don't see any other pathology on CT scan in that area. We discussed whether or not he wanted a surgical evaluation, and he declined at this time. I will give him a trial of Bentyl empirically to see if this helps at all. If not and symptoms persist he may consider seeing a surgeon about the hernia  Bancroft Cellar, MD [facility]

## 2017-11-21 ENCOUNTER — Ambulatory Visit (HOSPITAL_COMMUNITY)
Admission: RE | Admit: 2017-11-21 | Discharge: 2017-11-21 | Disposition: A | Discharge status: Home / Self Care | Payer: Medicare HMO | Source: Ambulatory Visit | Attending: Gastroenterology | Admitting: Gastroenterology

## 2017-11-21 ENCOUNTER — Telehealth: Payer: Self-pay

## 2017-11-21 DIAGNOSIS — K219 Gastro-esophageal reflux disease without esophagitis: Secondary | ICD-10-CM | POA: Diagnosis not present

## 2017-11-21 DIAGNOSIS — K449 Diaphragmatic hernia without obstruction or gangrene: Secondary | ICD-10-CM | POA: Insufficient documentation

## 2017-11-21 DIAGNOSIS — R1310 Dysphagia, unspecified: Secondary | ICD-10-CM | POA: Diagnosis present

## 2017-11-21 DIAGNOSIS — R109 Unspecified abdominal pain: Secondary | ICD-10-CM | POA: Insufficient documentation

## 2017-11-21 NOTE — Telephone Encounter (Signed)
Patient states that since starting the pantoprazole he has had difficulty urinating (since Saturday). He went to the pharmacist who stated that this is a possible side effect. I instructed him to STOP the pantoprazole, he asked about resuming the omeprazole to help control his GERD, okayed until I have heard back from Dr. Havery Moros. Asked patient several times if he felt he was able to completely empty his bladder, which he states he is able to do this, just having some difficulty, but does feel it is empty. I instructed otherwise he would need to go to an urgent care for evaluation.

## 2017-11-22 ENCOUNTER — Telehealth: Payer: Self-pay

## 2017-11-22 NOTE — Telephone Encounter (Signed)
Thanks for letting me know. Urinary retention or difficulty urinating due to PPI is not a side effect I have seen before. It's not listed in the drug insert as a possible side effect either. I guess it is possible and would hold the pantoprazole to see if it improves, can use omeprazole for the next few days to see if he notes any difference. If his symptoms persist despite changing meds he should see his PCP for further evaluation.   Otherwise can you let him know his barium study showed no evidence of stenosis / stricture / dysmotility, which is good, however when ingesting the tablet, it got hung up at the GEJ in the absence of a noticeable stricture. That is likely where his dysphagia is occurring, not sure if related to small hiatal hernia noted there. I previously dilated him to 55m and he did not notice any benefit ([place] use) last year. If his symptoms continue to really bother him I can offer him a repeat EGD with balloon dilation of the GEJ to a larger diameter to see if we can improve his symptoms, I think that is the only thing that may help relieve his symptoms. Can you let me know? Thanks

## 2017-11-22 NOTE — Telephone Encounter (Signed)
Called patient back this morning, having difficulty with the phone lines. Called him back and was able to lvm. Per signed DPR ok to leave detailed message. Let him know Dr. Doyne Keel recommendations to try the switch in medication for a couple of days to see if urinary symptoms improve. If not, instructed to call his PCP for further evaluation.  Also left barium swallow results and recommendations to contact our office if his dysphagia persists and would then schedule another EGD with balloon dilation. Call back here if he has further questions or concerns.

## 2017-11-22 NOTE — Telephone Encounter (Signed)
Patient did call back after driving to a different location for cell phone reception. Advised of all results and recommendations. He will call back if he wishes to schedule another EGD with balloon dilation.

## 2017-12-05 ENCOUNTER — Telehealth: Payer: Self-pay | Admitting: Family Medicine

## 2017-12-05 NOTE — Telephone Encounter (Signed)
Pt called and said he was contacted by his insurance company and told he needs another referral placed for [facility].

## 2017-12-06 ENCOUNTER — Other Ambulatory Visit: Payer: Self-pay | Admitting: Family Medicine

## 2017-12-06 DIAGNOSIS — N138 Other obstructive and reflux uropathy: Secondary | ICD-10-CM

## 2017-12-06 DIAGNOSIS — N401 Enlarged prostate with lower urinary tract symptoms: Principal | ICD-10-CM

## 2017-12-06 NOTE — Telephone Encounter (Signed)
Called patient and informed him of referral.  .Ozella Almond, CMA

## 2017-12-06 NOTE — Telephone Encounter (Signed)
Referral placed.

## 2017-12-19 NOTE — Progress Notes (Signed)
Subjective:   Patient ID: Jeffrey [name]    DOB: 03-23-1946, 72 y.o. male   MRN: [id number]  Jeffrey [name] is a 72 y.o. male with a history of HIV (stable and currently being treated by Dr. [name]), BPH, erectile dysfunction, GERD, and dysphagia (followed by Dr. Havery Moros) here for yearly physical.  Concerns today: None  1. HTN:  -Currently well controlled, only currently taking Tamsulosin for BPH  2. CKD-III: - History of known CKD, pt currently on HAART therapy for HIV - Most recent CMP reveals Cr 1.56 (around baseline for patient) - Denies taking NSAIDs - Denies any LE edema, SOB when lying flat or at rest, or chest pain  3. HIV:  -followed by Dr. [name] -currently being treating with Triumeq  -most recent labs (07/24/17) reveal stable viral load (<20) and CD4 count (320)  -next labs scheduled for October 2019 -Denies any fevers, chills, nausea, vomiting, abdominal pain, diarrhea, chest pain, SOB, headaches.  4. GERD -followed by Dr. Havery Moros -Currently taking Pantoprazole 60m with PRN Carafate for breakthrough -Patient notes his symptoms are well managed with current treatment and avoidance of triggers  5. BPH:  -followed by Dr. DDiona Fanti-currently taking Flomax (tamsulosin) vs Proscar (finasteride) -Denies any increased frequency, urgency, nocturia, difficulty starting urination, weak stream, dribbling, dysuria, or hematuria -Patient notes his symptoms are well managed  6. Dyslipidemia: -Most recent labs reveal slightly elevated cholesterol (209), Triglycerides (218) and LDL (125) -10 year CHD risk is 16%  Social Hx:  Sexual activity: Currently, monogamous, male STD Screening: Denies Exercise: Walk 1 mile every night, works out in the yard Diet: Well-balanced, admits to [employer]but does grill hamburgers and steak Smoking: no history of smoking Alcohol: very seldom (usually beer)  Drugs: Hx of marijuana use  Advance directives/Living  Will: both have been set up Mood: Negative to both  1. Have you been bothered by feeling down, depressed, or hopeless?  2. Have you often been bothered by having little interest or pleasure in doing things? Health Maintenance: reviewed and UTD  Review of Systems:  Per HPI.   [place] and smoking status reviewed.  Objective:   BP 110/72   Pulse 66   Temp 98 F (36.7 C) (Oral)   Ht 5' 10"  (1.778 m)   Wt 170 lb 6.4 oz (77.3 kg)   SpO2 95%   BMI 24.45 kg/m  Vitals and nursing note reviewed. Weight is stable.  General: well nourished, well developed, in no acute distress with non-toxic appearance HEENT: normocephalic, atraumatic, moist mucous membranes Neck: supple, normal ROM CV: regular rate and rhythm without murmurs, rubs, or gallops, no lower extremity edema Lungs: clear to auscultation bilaterally with normal work of breathing Abdomen: soft, non-tender, non-distended, normoactive bowel sounds Skin: warm, dry, no rashes or lesions Extremities: warm and well perfused, normal tone MSK: ROM grossly intact, strength intact, gait normal, Rhomberg's negative Neuro: Alert and oriented, speech normal  Assessment & Plan:   Essential hypertension -Well controlled, normotensive today -Not currently on HTN medications except for Tamsulosin for BPH -Will continue to monitor  Dyslipidemia 10-year CHD risk is 16% Patient qualifies for statin therapy Discussed with patient and patient declined at this time Recommended lifestyle modifications including diet and exercise Will continue to monitor  Encounter for health maintenance examination in adult Patient does not qualify for screening of AAA (nonsmoker, no family history)  Patient being seen by urology so Prostate cancer screening deferred Health maintenance up to date  Chronic kidney disease Most recent Cr was 1.56 (around baseline for patient) Patient is asymptomatic  Will continue to monitor  Human  immunodeficiency virus (HIV) disease ([place]) Stable and well controlled with Triumeq (stable CD4 count and undetectable viral load since 2014) Currently asymptomatic, tolerating treatment well Followed by Dr. [name]  GERD Well controlled on Pantoprazole with Carafate as needed, tolerating medications well Followed by Dr. Havery Moros   BPH with obstruction/lower urinary tract symptoms Currently on Flomax and Proscar, tolerating well Symptoms have improved and currently asymptomatic  Followed by Dr. Noel Gerold, DO PGY-1, [facility] 12/20/2017 11:01 AM

## 2017-12-20 ENCOUNTER — Other Ambulatory Visit: Payer: Self-pay

## 2017-12-20 ENCOUNTER — Encounter: Payer: Self-pay | Admitting: Family Medicine

## 2017-12-20 ENCOUNTER — Ambulatory Visit (INDEPENDENT_AMBULATORY_CARE_PROVIDER_SITE_OTHER): Payer: Medicare HMO | Admitting: Family Medicine

## 2017-12-20 VITALS — BP 110/72 | HR 66 | Temp 98.0°F | Ht 70.0 in | Wt 170.4 lb

## 2017-12-20 DIAGNOSIS — B20 Human immunodeficiency virus [HIV] disease: Secondary | ICD-10-CM

## 2017-12-20 DIAGNOSIS — E785 Hyperlipidemia, unspecified: Secondary | ICD-10-CM | POA: Diagnosis not present

## 2017-12-20 DIAGNOSIS — N138 Other obstructive and reflux uropathy: Secondary | ICD-10-CM

## 2017-12-20 DIAGNOSIS — K219 Gastro-esophageal reflux disease without esophagitis: Secondary | ICD-10-CM | POA: Diagnosis not present

## 2017-12-20 DIAGNOSIS — I10 Essential (primary) hypertension: Secondary | ICD-10-CM | POA: Diagnosis not present

## 2017-12-20 DIAGNOSIS — Z0000 Encounter for general adult medical examination without abnormal findings: Secondary | ICD-10-CM | POA: Diagnosis not present

## 2017-12-20 DIAGNOSIS — N189 Chronic kidney disease, unspecified: Secondary | ICD-10-CM | POA: Insufficient documentation

## 2017-12-20 DIAGNOSIS — N183 Chronic kidney disease, stage 3 unspecified: Secondary | ICD-10-CM

## 2017-12-20 DIAGNOSIS — N401 Enlarged prostate with lower urinary tract symptoms: Secondary | ICD-10-CM | POA: Diagnosis not present

## 2017-12-20 NOTE — Assessment & Plan Note (Signed)
Most recent Cr was 1.56 (around baseline for patient) Patient is asymptomatic  Will continue to monitor

## 2017-12-20 NOTE — Assessment & Plan Note (Signed)
Currently on Flomax and Proscar, tolerating well Symptoms have improved and currently asymptomatic  Followed by Dr. Diona Fanti

## 2017-12-20 NOTE — Assessment & Plan Note (Addendum)
-  Well controlled, normotensive today -Not currently on HTN medications except for Tamsulosin for BPH -Will continue to monitor

## 2017-12-20 NOTE — Assessment & Plan Note (Addendum)
Stable and well controlled with Triumeq (stable CD4 count and undetectable viral load since 2014) Currently asymptomatic, tolerating treatment well Followed by Dr. [name]

## 2017-12-20 NOTE — Assessment & Plan Note (Signed)
Patient does not qualify for screening of AAA (nonsmoker, no family history)  Patient being seen by urology so Prostate cancer screening deferred Health maintenance up to date

## 2017-12-20 NOTE — Patient Instructions (Signed)
Thank you for coming to see me today. It was such a pleasure.   Your physical today was normal and you appear to be doing great! You are currently up to date on all of your vaccines and are not due for any screenings at this time.  We discussed adding a Statin medication due to your risk of coronary heart disease and most recent lipid levels. You opted to wait on this at this time. Please let me know if you decide differently at another time.  Please follow-up with me in 1 year for your yearly physical. Dont forget your flu shot in September.  If you have any questions or concerns, please do not hesitate to call the office at [phone].  Take Care,   Dr. Mina Marble, DO Resident Physician [place] [phone]

## 2017-12-20 NOTE — Assessment & Plan Note (Signed)
Well controlled on Pantoprazole with Carafate as needed, tolerating medications well Followed by Dr. Havery Moros

## 2017-12-20 NOTE — Assessment & Plan Note (Addendum)
10-year CHD risk is 16% Patient qualifies for statin therapy Discussed with patient and patient declined at this time Recommended lifestyle modifications including diet and exercise Will continue to monitor

## 2018-01-23 ENCOUNTER — Other Ambulatory Visit: Payer: Medicare HMO

## 2018-01-23 DIAGNOSIS — B20 Human immunodeficiency virus [HIV] disease: Secondary | ICD-10-CM

## 2018-01-24 LAB — T-HELPER CELL (CD4) - (RCID CLINIC ONLY)
CD4 T CELL HELPER: 20 % — AB (ref 33–55)
CD4 T Cell Abs: 540 /uL (ref 400–2700)

## 2018-01-26 LAB — HIV-1 RNA QUANT-NO REFLEX-BLD
HIV 1 RNA QUANT: DETECTED {copies}/mL — AB
HIV-1 RNA QUANT, LOG: DETECTED {Log_copies}/mL — AB

## 2018-02-07 ENCOUNTER — Encounter: Payer: Self-pay | Admitting: Internal Medicine

## 2018-02-07 ENCOUNTER — Ambulatory Visit (INDEPENDENT_AMBULATORY_CARE_PROVIDER_SITE_OTHER): Payer: Medicare HMO | Admitting: Internal Medicine

## 2018-02-07 DIAGNOSIS — Z23 Encounter for immunization: Secondary | ICD-10-CM

## 2018-02-07 DIAGNOSIS — B20 Human immunodeficiency virus [HIV] disease: Secondary | ICD-10-CM

## 2018-02-07 NOTE — Assessment & Plan Note (Signed)
His infection is under excellent, long-term control.  He will continue Triumeq and follow-up after lab work in 6 months.  He received his influenza vaccine today.

## 2018-02-07 NOTE — Progress Notes (Signed)
Patient Active Problem List   Diagnosis Date Noted  . Human immunodeficiency virus (HIV) disease ([place]) 02/27/2006    Priority: High  . Left ankle pain 08/07/2013    Priority: Medium  . Dyslipidemia 07/22/2012    Priority: Medium  . Erectile dysfunction 07/22/2012    Priority: Medium  . GERD 05/17/2010    Priority: Medium  . Dysphagia 03/08/2009    Priority: Medium  . Essential hypertension 02/27/2006    Priority: Medium  . Encounter for health maintenance examination in adult 12/20/2017  . Chronic kidney disease 12/20/2017  . Diastolic CHF with preserved left ventricular function, NYHA class 2 ([place]) 01/06/2015  . Muscle spasm of back 10/15/2014  . Exertional dyspnea 09/30/2014  . Insomnia 07/22/2014  . BPH with obstruction/lower urinary tract symptoms 06/17/2014  . Screening for colon cancer 10/07/2013  . CERVICAL LYMPHADENOPATHY 09/19/2007  . CHOLECYSTECTOMY, HX OF 05/28/2006  . PNEUMONIA, HX OF 02/27/2006  . ARTHROSCOPY, KNEE, HX OF 02/27/2006    Patient's Medications  New Prescriptions   No medications on file  Previous Medications   ASPIRIN EC 81 MG TABLET    Take 1 tablet (81 mg total) by mouth daily.   DICYCLOMINE (BENTYL) 10 MG CAPSULE    Take 1-2 capsules (10-20 mg total) by mouth every 8 (eight) hours as needed for spasms.   FINASTERIDE (PROSCAR) 5 MG TABLET    Take 5 mg by mouth daily.   MULTIPLE VITAMINS-MINERALS (MULTIVITAMIN PO)    Take 1 tablet by mouth daily.   PANTOPRAZOLE (PROTONIX) 40 MG TABLET    Take 1 tablet (40 mg total) by mouth 2 (two) times daily.   SUCRALFATE (CARAFATE) 1 GM/10ML SUSPENSION    Take 10 mLs (1 g total) by mouth every 6 (six) hours as needed.   TAMSULOSIN (FLOMAX) 0.4 MG CAPS CAPSULE    TAKE TWO CAPSULES BY MOUTH ONCE DAILY   TRIUMEQ 600-50-300 MG TABLET    TAKE ONE TABLET BY MOUTH ONCE DAILY  Modified Medications   No medications on file  Discontinued Medications   No medications on file    Subjective: Jeffrey [name] is  in for his routine HIV follow-up visit.  He has had no problems obtaining, taking or tolerating his Triumeq.  As usual he never misses a single dose.  He is feeling well other than wanting the heat and humidity to go away.  Review of Systems: Review of Systems  Constitutional: Negative for chills, diaphoresis and fever.  Respiratory: Negative for cough.   Cardiovascular: Negative for chest pain.  Gastrointestinal: Negative for abdominal pain, diarrhea, nausea and vomiting.    Past Medical History:  Diagnosis Date  . Colon polyps   . GERD (gastroesophageal reflux disease)   . HIV (human immunodeficiency virus infection) ([name]) 1999    Social History   Tobacco Use  . Smoking status: Never Smoker  . Smokeless tobacco: Former Systems developer    Types: Chew  . Tobacco comment: Rare use, about q 6 months  Substance Use Topics  . Alcohol use: Yes    Comment: rare  . Drug use: No    Family History  Problem Relation Age of Onset  . Hypertension Mother   . Heart attack Mother 65  . Colon cancer Neg Hx   . Esophageal cancer Neg Hx   . Pancreatic cancer Neg Hx   . Stomach cancer Neg Hx   . Liver disease Neg Hx     Allergies  Allergen Reactions  . Baclofen     Back pain. Patient said it made him feel crazy  . Efavirenz     Rash  . Nevirapine     Rash    Health Maintenance  Topic Date Due  . INFLUENZA VACCINE  12/06/2017  . COLONOSCOPY  08/03/2020  . TETANUS/TDAP  01/05/2025  . Hepatitis C Screening  Completed  . PNA vac Low Risk Adult  Completed    Objective:  Vitals:   02/07/18 1056  BP: (!) 143/87  Pulse: 81  Temp: 97.7 F (36.5 C)  Weight: 167 lb (75.8 kg)  Height: 5' 10"  (1.778 m)   Body mass index is 23.96 kg/m.  Physical Exam  Constitutional: He is oriented to person, place, and time.  He is in good spirits as usual.  His weight is unchanged.  Cardiovascular: Normal rate, regular rhythm and normal heart sounds.  Pulmonary/Chest: Effort normal and breath  sounds normal.  Abdominal: Soft. There is no tenderness.  Neurological: He is alert and oriented to person, place, and time.  Skin: No rash noted.  Psychiatric: He has a normal mood and affect.    Lab Results Lab Results  Component Value Date   WBC 6.5 07/24/2017   HGB 14.3 07/24/2017   HCT 40.2 07/24/2017   MCV 97.3 07/24/2017   PLT 195 07/24/2017    Lab Results  Component Value Date   CREATININE 1.56 (H) 08/28/2017   BUN 26 (H) 08/28/2017   NA 138 08/28/2017   K 3.5 08/28/2017   CL 105 08/28/2017   CO2 30 08/28/2017    Lab Results  Component Value Date   ALT 23 08/28/2017   AST 23 08/28/2017   ALKPHOS 76 08/28/2017   BILITOT 0.4 08/28/2017    Lab Results  Component Value Date   CHOL 209 (H) 07/24/2017   HDL 48 07/24/2017   LDLCALC 125 (H) 07/24/2017   TRIG 218 (H) 07/24/2017   CHOLHDL 4.4 07/24/2017   Lab Results  Component Value Date   LABRPR NON-REACTIVE 07/24/2017   HIV 1 RNA Quant (copies/mL)  Date Value  01/23/2018 <20 DETECTED (A)  07/24/2017 <20 DETECTED (A)  01/09/2017 <20 NOT DETECTED   CD4 T Cell Abs (/uL)  Date Value  01/23/2018 540  07/24/2017 320 (L)  01/09/2017 310 (L)     Problem List Items Addressed This Visit      High   Human immunodeficiency virus (HIV) disease ([place])    His infection is under excellent, long-term control.  He will continue Triumeq and follow-up after lab work in 6 months.  He received his influenza vaccine today.      Relevant Orders   T-helper cell (CD4)- ([facility] clinic only)   HIV-1 RNA quant-no reflex-bld   CBC   Comprehensive metabolic panel   Lipid panel   RPR    Other Visit Diagnoses    Need for immunization against influenza       Relevant Orders   Flu Vaccine QUAD 36+ mos IM (Completed)        [name], MD [facility] [place] [phone] pager   [phone] cell 02/07/2018, 11:20 AM

## 2018-02-08 DIAGNOSIS — N401 Enlarged prostate with lower urinary tract symptoms: Secondary | ICD-10-CM | POA: Diagnosis not present

## 2018-02-08 DIAGNOSIS — R311 Benign essential microscopic hematuria: Secondary | ICD-10-CM | POA: Diagnosis not present

## 2018-02-08 DIAGNOSIS — N5201 Erectile dysfunction due to arterial insufficiency: Secondary | ICD-10-CM | POA: Diagnosis not present

## 2018-03-11 ENCOUNTER — Other Ambulatory Visit: Payer: Self-pay | Admitting: *Deleted

## 2018-03-11 DIAGNOSIS — N138 Other obstructive and reflux uropathy: Secondary | ICD-10-CM

## 2018-03-11 DIAGNOSIS — N401 Enlarged prostate with lower urinary tract symptoms: Principal | ICD-10-CM

## 2018-03-12 MED ORDER — TAMSULOSIN HCL 0.4 MG PO CAPS: ORAL_CAPSULE | ORAL | 3 refills | Status: DC

## 2018-04-07 ENCOUNTER — Other Ambulatory Visit: Payer: Self-pay | Admitting: Internal Medicine

## 2018-04-07 DIAGNOSIS — B20 Human immunodeficiency virus [HIV] disease: Secondary | ICD-10-CM

## 2018-06-28 ENCOUNTER — Ambulatory Visit
Admission: EM | Admit: 2018-06-28 | Discharge: 2018-06-28 | Disposition: A | Discharge status: Home / Self Care | Payer: Medicare HMO | Attending: Family Medicine | Admitting: Family Medicine

## 2018-06-28 DIAGNOSIS — M5441 Lumbago with sciatica, right side: Secondary | ICD-10-CM | POA: Diagnosis not present

## 2018-06-28 MED ORDER — DICLOFENAC SODIUM 50 MG PO TBEC: 50.0000 mg | DELAYED_RELEASE_TABLET | Freq: Two times a day (BID) | ORAL | 0 refills | Status: DC

## 2018-06-28 MED ORDER — CYCLOBENZAPRINE HCL 5 MG PO TABS: 5.0000 mg | ORAL_TABLET | Freq: Two times a day (BID) | ORAL | 0 refills | Status: DC | PRN

## 2018-06-28 MED ORDER — KETOROLAC TROMETHAMINE 60 MG/2ML IM SOLN
60.0000 mg | Freq: Once | INTRAMUSCULAR | Status: AC
  Administered 2018-06-28: 60 mg via INTRAMUSCULAR

## 2018-06-28 NOTE — Discharge Instructions (Addendum)
We gave you a shot of Toradol today, this should begin working in 30 to 40 minutes Please begin diclofenac twice daily with food-please begin this later this evening May supplement with Tylenol extra strength tablets You may use flexeril as needed to help with pain. This is a muscle relaxer and causes sedation- please use only at bedtime or when you will be home and not have to drive/work-begin with 1 tablet, may increase to 2 if not having significant drowsiness Alternate ice and heat Avoid complete bedrest, avoid heavy lifting  Please follow-up if symptoms worsening, developing numbness or tingling between thighs, issues with urination, unable to control urination or bowel movements

## 2018-06-28 NOTE — ED Triage Notes (Signed)
Pt c/o lower back pain radiating down rt leg

## 2018-06-28 NOTE — ED Provider Notes (Signed)
[name]    CSN: [id number] Arrival date & time: 06/28/18  1144     History   Chief Complaint Chief Complaint  Patient presents with  . Back Pain    HPI Jeffrey [name] is a 73 y.o. male history of HIV, diastolic CHF, presenting today for evaluation of back pain.  Patient has had left-sided lower back pain that is radiating down his right leg.  He denies any specific injury, increase in activity or heavy lifting.  States that his pain started after he stood up out of a chair while he was watching TV.  Pain began on Tuesday, approximately 4 days ago and has persisted since.  He has mainly been taking Tylenol extra strength around the clock without relief.  He is also tried heat.  Denies previous issues with his back.  Denies saddle anesthesia, loss of bowel or bladder control.  Denies changes in urination, dysuria.  States that he has had a slight discomfort in his left testicle, but this is been mild.  Denies any testicular swelling or changes in the skin.   Notes this is off and on and he does not have it currently.  HPI  Past Medical History:  Diagnosis Date  . Colon polyps   . GERD (gastroesophageal reflux disease)   . HIV (human immunodeficiency virus infection) ([place]) 1999    Patient Active Problem List   Diagnosis Date Noted  . Encounter for health maintenance examination in adult 12/20/2017  . Chronic kidney disease 12/20/2017  . Diastolic CHF with preserved left ventricular function, NYHA class 2 ([place]) 01/06/2015  . Muscle spasm of back 10/15/2014  . Exertional dyspnea 09/30/2014  . Insomnia 07/22/2014  . BPH with obstruction/lower urinary tract symptoms 06/17/2014  . Screening for colon cancer 10/07/2013  . Left ankle pain 08/07/2013  . Dyslipidemia 07/22/2012  . Erectile dysfunction 07/22/2012  . GERD 05/17/2010  . Dysphagia 03/08/2009  . CERVICAL LYMPHADENOPATHY 09/19/2007  . CHOLECYSTECTOMY, HX OF 05/28/2006  . Human immunodeficiency virus (HIV)  disease ([place]) 02/27/2006  . Essential hypertension 02/27/2006  . PNEUMONIA, HX OF 02/27/2006  . ARTHROSCOPY, KNEE, HX OF 02/27/2006    Past Surgical History:  Procedure Laterality Date  . CHOLECYSTECTOMY  1982  . ESOPHAGEAL MANOMETRY N/A 09/03/2017   Procedure: ESOPHAGEAL MANOMETRY (EM);  Surgeon: Mauri Pole, MD;  Location: [facility];  Service: Endoscopy;  Laterality: N/A;  . KNEE SURGERY     LEFT KNEE  . TONSILLECTOMY         Home Medications    Prior to Admission medications   Medication Sig Start Date End Date Taking? Authorizing Provider  aspirin EC 81 MG tablet Take 1 tablet (81 mg total) by mouth daily. 10/11/15   Karamalegos, Devonne Doughty, DO  cyclobenzaprine (FLEXERIL) 5 MG tablet Take 1-2 tablets (5-10 mg total) by mouth 2 (two) times daily as needed for muscle spasms. 06/28/18   Wieters, Hallie C, PA-C  diclofenac (VOLTAREN) 50 MG EC tablet Take 1 tablet (50 mg total) by mouth 2 (two) times daily. 06/28/18   Wieters, Hallie C, PA-C  dicyclomine (BENTYL) 10 MG capsule Take 1-2 capsules (10-20 mg total) by mouth every 8 (eight) hours as needed for spasms. 11/02/17   Armbruster, Carlota Raspberry, MD  finasteride (PROSCAR) 5 MG tablet Take 5 mg by mouth daily.    [provider]  Multiple Vitamins-Minerals (MULTIVITAMIN PO) Take 1 tablet by mouth daily.    [provider]  pantoprazole (PROTONIX) 40 MG  tablet Take 1 tablet (40 mg total) by mouth 2 (two) times daily. 11/02/17   Armbruster, Carlota Raspberry, MD  sucralfate (CARAFATE) 1 GM/10ML suspension Take 10 mLs (1 g total) by mouth every 6 (six) hours as needed. 11/02/17   Armbruster, Carlota Raspberry, MD  tamsulosin (FLOMAX) 0.4 MG CAPS capsule TAKE TWO CAPSULES BY MOUTH ONCE DAILY 03/12/18   Mullis, Kiersten P, DO  TRIUMEQ 600-50-300 MG tablet TAKE 1 TABLET BY MOUTH ONCE DAILY 04/08/18   [name], MD    Family History Family History  Problem Relation Age of Onset  . Hypertension Mother   . Heart attack Mother 54  .  Colon cancer Neg Hx   . Esophageal cancer Neg Hx   . Pancreatic cancer Neg Hx   . Stomach cancer Neg Hx   . Liver disease Neg Hx     Social History Social History   Tobacco Use  . Smoking status: Never Smoker  . Smokeless tobacco: Former Systems developer    Types: Chew  . Tobacco comment: Rare use, about q 6 months  Substance Use Topics  . Alcohol use: Yes    Comment: rare  . Drug use: No     Allergies   Baclofen; Efavirenz; and Nevirapine   Review of Systems Review of Systems  Constitutional: Negative for fatigue and fever.  HENT: Negative for congestion, sinus pressure and sore throat.   Eyes: Negative for photophobia, pain and visual disturbance.  Respiratory: Negative for cough and shortness of breath.   Cardiovascular: Negative for chest pain.  Gastrointestinal: Negative for abdominal pain, nausea and vomiting.  Genitourinary: Positive for testicular pain. Negative for decreased urine volume, difficulty urinating and hematuria.  Musculoskeletal: Positive for back pain and myalgias. Negative for neck pain and neck stiffness.  Neurological: Positive for headaches. Negative for dizziness, syncope, facial asymmetry, speech difficulty, weakness, light-headedness and numbness.     Physical Exam Triage Vital Signs ED Triage Vitals  Enc Vitals Group     BP 06/28/18 1151 (!) 161/106     Pulse Rate 06/28/18 1151 93     Resp 06/28/18 1151 18     Temp 06/28/18 1151 (!) 97.5 F (36.4 C)     Temp src --      SpO2 06/28/18 1151 96 %     Weight --      Height --      Head Circumference --      Peak Flow --      Pain Score 06/28/18 1152 8     Pain Loc --      Pain Edu? --      Excl. in [place]? --    No data found.  Updated Vital Signs BP (!) 161/106 (BP Location: Left Arm)   Pulse 93   Temp (!) 97.5 F (36.4 C)   Resp 18   SpO2 96%   Visual Acuity Right Eye Distance:   Left Eye Distance:   Bilateral Distance:    Right Eye Near:   Left Eye Near:    Bilateral Near:      Physical Exam Vitals signs and nursing note reviewed.  Constitutional:      Appearance: He is well-developed.  HENT:     Head: Normocephalic and atraumatic.  Eyes:     Conjunctiva/sclera: Conjunctivae normal.  Neck:     Musculoskeletal: Neck supple.  Cardiovascular:     Rate and Rhythm: Normal rate and regular rhythm.     Heart sounds: No murmur.  Pulmonary:  Effort: Pulmonary effort is normal. No respiratory distress.     Breath sounds: Normal breath sounds.  Abdominal:     Palpations: Abdomen is soft.     Tenderness: There is no abdominal tenderness.  Musculoskeletal:     Comments: Nontender to palpation of cervical, thoracic spine midline, tenderness throughout lumbar spine midline, increased tenderness throughout left lateral lumbar musculature; unable to test straight leg raise due to pain  Hip strength 5/5 and equal bilaterally, knee strength 5/5 and equal bilaterally, patellar reflex 2+ bilaterally  Skin:    General: Skin is warm and dry.  Neurological:     Mental Status: He is alert.      UC Treatments / Results  Labs (all labs ordered are listed, but only abnormal results are displayed) Labs Reviewed - No data to display  EKG None  Radiology No results found.  Procedures Procedures (including critical care time)  Medications Ordered in UC Medications  ketorolac (TORADOL) injection 60 mg (has no administration in time range)    Initial Impression / Assessment and Plan / UC Course  I have reviewed the triage vital signs and the nursing notes.  Pertinent labs & imaging results that were available during my care of the patient were reviewed by me and considered in my medical decision making (see chart for details).     Patient with back pain suggestive of sciatica versus other muscular cause of low back pain.  Given HIV status will defer steroids at this time, will opt for temporary course of NSAIDs, but will stop once pain resolved to avoid  worsening her issues with his CHF.  Toradol provided in clinic prior to discharge.  Will initiate diclofenac twice daily as well as continue Tylenol.  Alternate ice and heat.  Flexeril as needed, discussed drowsiness regarding Flexeril.  No red flags for cauda equina at this time, will continue to monitor the discomfort he is having in his testicle,Discussed strict return precautions. Patient verbalized understanding and is agreeable with plan.  Final Clinical Impressions(s) / UC Diagnoses   Final diagnoses:  Acute left-sided low back pain with right-sided sciatica     Discharge Instructions     We gave you a shot of Toradol today, this should begin working in 30 to 40 minutes Please begin diclofenac twice daily with food-please begin this later this evening May supplement with Tylenol extra strength tablets You may use flexeril as needed to help with pain. This is a muscle relaxer and causes sedation- please use only at bedtime or when you will be home and not have to drive/work-begin with 1 tablet, may increase to 2 if not having significant drowsiness Alternate ice and heat Avoid complete bedrest, avoid heavy lifting  Please follow-up if symptoms worsening, developing numbness or tingling between thighs, issues with urination, unable to control urination or bowel movements   ED Prescriptions    Medication Sig Dispense Auth. Provider   cyclobenzaprine (FLEXERIL) 5 MG tablet Take 1-2 tablets (5-10 mg total) by mouth 2 (two) times daily as needed for muscle spasms. 24 tablet Wieters, Hallie C, PA-C   diclofenac (VOLTAREN) 50 MG EC tablet Take 1 tablet (50 mg total) by mouth 2 (two) times daily. 30 tablet Wieters, [place] C, PA-C     Controlled Substance Prescriptions [place] Controlled Substance Registry consulted? Not Applicable   Janith Lima, [place] 06/28/18 1223

## 2018-07-04 ENCOUNTER — Telehealth (HOSPITAL_COMMUNITY): Payer: Self-pay | Admitting: Emergency Medicine

## 2018-07-04 NOTE — Telephone Encounter (Signed)
Returned patients voicemail asking about indigestion. Per hallie, can take omeprazole and make sure he takes medicine with food. If he notices blood in stool, to follow up with PCP. Attempted to reach patient. No answer at this time. Voicemail left.

## 2018-07-04 NOTE — Telephone Encounter (Signed)
Patient contacted and made aware of all results, all questions answered.   

## 2018-07-08 ENCOUNTER — Ambulatory Visit: Payer: Medicare HMO | Admitting: Family Medicine

## 2018-07-25 ENCOUNTER — Other Ambulatory Visit: Payer: Self-pay

## 2018-07-25 ENCOUNTER — Other Ambulatory Visit: Payer: Medicare HMO

## 2018-07-25 DIAGNOSIS — Z79899 Other long term (current) drug therapy: Secondary | ICD-10-CM | POA: Diagnosis not present

## 2018-07-25 DIAGNOSIS — E785 Hyperlipidemia, unspecified: Secondary | ICD-10-CM | POA: Diagnosis not present

## 2018-07-25 DIAGNOSIS — Z0000 Encounter for general adult medical examination without abnormal findings: Secondary | ICD-10-CM | POA: Diagnosis not present

## 2018-07-25 DIAGNOSIS — B20 Human immunodeficiency virus [HIV] disease: Secondary | ICD-10-CM | POA: Diagnosis not present

## 2018-07-25 DIAGNOSIS — I10 Essential (primary) hypertension: Secondary | ICD-10-CM | POA: Diagnosis not present

## 2018-07-26 LAB — T-HELPER CELL (CD4) - (RCID CLINIC ONLY)
CD4 % Helper T Cell: 23 % — ABNORMAL LOW (ref 33–55)
CD4 T Cell Abs: 290 /uL — ABNORMAL LOW (ref 400–2700)

## 2018-07-29 ENCOUNTER — Other Ambulatory Visit: Payer: Self-pay | Admitting: Gastroenterology

## 2018-07-30 LAB — CBC
HEMATOCRIT: 38.3 % — AB (ref 38.5–50.0)
HEMOGLOBIN: 13.5 g/dL (ref 13.2–17.1)
MCH: 34.8 pg — ABNORMAL HIGH (ref 27.0–33.0)
MCHC: 35.2 g/dL (ref 32.0–36.0)
MCV: 98.7 fL (ref 80.0–100.0)
MPV: 9.1 fL (ref 7.5–12.5)
Platelets: 209 10*3/uL (ref 140–400)
RBC: 3.88 10*6/uL — ABNORMAL LOW (ref 4.20–5.80)
RDW: 13.2 % (ref 11.0–15.0)
WBC: 7.5 10*3/uL (ref 3.8–10.8)

## 2018-07-30 LAB — LIPID PANEL
CHOL/HDL RATIO: 3.8 (calc) (ref <5.0)
Cholesterol: 181 mg/dL (ref <200)
HDL: 48 mg/dL (ref >=40)
LDL CHOLESTEROL (CALC): 111 mg/dL — AB
Non-HDL Cholesterol (Calc): 133 mg/dL (calc) — ABNORMAL HIGH (ref <130)
Triglycerides: 109 mg/dL (ref <150)

## 2018-07-30 LAB — COMPREHENSIVE METABOLIC PANEL
AG Ratio: 1.8 (calc) (ref 1.0–2.5)
ALT: 23 U/L (ref 9–46)
AST: 24 U/L (ref 10–35)
Albumin: 4.2 g/dL (ref 3.6–5.1)
Alkaline phosphatase (APISO): 66 U/L (ref 35–144)
BILIRUBIN TOTAL: 0.4 mg/dL (ref 0.2–1.2)
BUN/Creatinine Ratio: 14 (calc) (ref 6–22)
BUN: 25 mg/dL (ref 7–25)
CALCIUM: 9.6 mg/dL (ref 8.6–10.3)
CO2: 27 mmol/L (ref 20–32)
CREATININE: 1.81 mg/dL — AB (ref 0.70–1.18)
Chloride: 106 mmol/L (ref 98–110)
GLUCOSE: 112 mg/dL — AB (ref 65–99)
Globulin: 2.4 g/dL (calc) (ref 1.9–3.7)
Potassium: 4.2 mmol/L (ref 3.5–5.3)
SODIUM: 140 mmol/L (ref 135–146)
TOTAL PROTEIN: 6.6 g/dL (ref 6.1–8.1)

## 2018-07-30 LAB — RPR: RPR Ser Ql: NONREACTIVE

## 2018-07-30 LAB — HIV-1 RNA QUANT-NO REFLEX-BLD
HIV 1 RNA QUANT: NOT DETECTED {copies}/mL
HIV-1 RNA QUANT, LOG: NOT DETECTED {Log_copies}/mL

## 2018-08-08 ENCOUNTER — Ambulatory Visit (INDEPENDENT_AMBULATORY_CARE_PROVIDER_SITE_OTHER): Payer: Medicare HMO | Admitting: Internal Medicine

## 2018-08-08 ENCOUNTER — Encounter: Payer: Self-pay | Admitting: Internal Medicine

## 2018-08-08 ENCOUNTER — Other Ambulatory Visit: Payer: Self-pay

## 2018-08-08 DIAGNOSIS — B20 Human immunodeficiency virus [HIV] disease: Secondary | ICD-10-CM | POA: Diagnosis not present

## 2018-08-08 NOTE — Progress Notes (Signed)
Virtual Visit via Telephone Note  I connected with Jeffrey [name] on 08/08/18 at  9:00 AM EDT by telephone and verified that I am speaking with the correct person using two identifiers.   I discussed the limitations, risks, security and privacy concerns of performing an evaluation and management service by telephone and the availability of in person appointments. I also discussed with the patient that there may be a patient responsible charge related to this service. The patient expressed understanding and agreed to proceed.   History of Present Illness: I spoke with [name] by phone today.  He has had no problems obtaining, taking or tolerating his Triumeq and does not miss doses.  He is feeling well other than some recent low back pain that occurred after he got on his roof to clean his gutters.  His pain is improving slowly.  He is trying best to stay active.   Observations/Objective: HIV 1 RNA Quant (copies/mL)  Date Value  07/25/2018 <20 NOT DETECTED  01/23/2018 <20 DETECTED (A)  07/24/2017 <20 DETECTED (A)   CD4 T Cell Abs (/uL)  Date Value  07/25/2018 290 (L)  01/23/2018 540  07/24/2017 320 (L)    Assessment and Plan: His infection remains under excellent, long-term control.  He will continue Triumeq and follow-up after lab work in 6 months.  Follow Up Instructions:    I discussed the assessment and treatment plan with the patient. The patient was provided an opportunity to ask questions and all were answered. The patient agreed with the plan and demonstrated an understanding of the instructions.   The patient was advised to call back or seek an in-person evaluation if the symptoms worsen or if the condition fails to improve as anticipated.  I provided 13 minutes of non-face-to-face time during this encounter.   [name], MD

## 2018-10-13 ENCOUNTER — Other Ambulatory Visit: Payer: Self-pay | Admitting: Internal Medicine

## 2018-10-13 DIAGNOSIS — B20 Human immunodeficiency virus [HIV] disease: Secondary | ICD-10-CM

## 2018-11-26 ENCOUNTER — Ambulatory Visit (INDEPENDENT_AMBULATORY_CARE_PROVIDER_SITE_OTHER): Payer: Medicare HMO | Admitting: Family Medicine

## 2018-11-26 ENCOUNTER — Other Ambulatory Visit: Payer: Self-pay

## 2018-11-26 VITALS — BP 125/80 | HR 79

## 2018-11-26 DIAGNOSIS — H81393 Other peripheral vertigo, bilateral: Secondary | ICD-10-CM | POA: Diagnosis not present

## 2018-11-26 DIAGNOSIS — R42 Dizziness and giddiness: Secondary | ICD-10-CM | POA: Diagnosis not present

## 2018-11-26 NOTE — Patient Instructions (Signed)
Was great meeting you today!  I think your vertigo is being caused by something called an otolith.  This is a stone that rest on 1 of the nerves in your inner ear.  The best treatment for this is going to neuro vestibular rehab.  I placed this referral today.  They will call you to schedule an appointment.  Also to get a lipid and blood work to rule out any weird electrolyte abnormalities that can cause similar symptoms.  I would highly recommend using some Debrox drops to soften the earwax in your ears.  This could be causing her symptoms as well.

## 2018-11-27 LAB — COMPREHENSIVE METABOLIC PANEL
ALT: 16 IU/L (ref 0–44)
AST: 18 IU/L (ref 0–40)
Albumin/Globulin Ratio: 1.9 (ref 1.2–2.2)
Albumin: 4.3 g/dL (ref 3.8–4.8)
Alkaline Phosphatase: 77 IU/L (ref 39–117)
BUN/Creatinine Ratio: 18 (ref 10–24)
BUN: 27 mg/dL (ref 8–27)
Bilirubin Total: 0.3 mg/dL (ref 0.0–1.2)
CO2: 23 mmol/L (ref 20–29)
Calcium: 9.4 mg/dL (ref 8.6–10.2)
Chloride: 105 mmol/L (ref 96–106)
Creatinine, Ser: 1.47 mg/dL — ABNORMAL HIGH (ref 0.76–1.27)
GFR calc Af Amer: 55 mL/min/{1.73_m2} — ABNORMAL LOW (ref >59)
GFR calc non Af Amer: 48 mL/min/{1.73_m2} — ABNORMAL LOW (ref >59)
Globulin, Total: 2.3 g/dL (ref 1.5–4.5)
Glucose: 99 mg/dL (ref 65–99)
Potassium: 4.8 mmol/L (ref 3.5–5.2)
Sodium: 140 mmol/L (ref 134–144)
Total Protein: 6.6 g/dL (ref 6.0–8.5)

## 2018-11-29 ENCOUNTER — Encounter: Payer: Self-pay | Admitting: Family Medicine

## 2018-11-29 DIAGNOSIS — H81399 Other peripheral vertigo, unspecified ear: Secondary | ICD-10-CM | POA: Insufficient documentation

## 2018-11-29 NOTE — Progress Notes (Signed)
   HPI 73 year old male who presents for balance issue.  Patient states that the balance issue started about 2 weeks ago.  Has been progressively worse.  He states that he intermittently also feels like the room is spinning.  This happened to him once while he was driving, which prompted him to come in to get evaluated.  Patient states that he can turn his head and the symptoms will start all of a sudden.  He has had no ear pain.  No other issues such as nausea or vomiting.  Patient has had no recent upper respiratory infection.  CC: Balance issues   ROS:   Review of Systems See HPI for ROS.   CC, SH/smoking status, and VS noted  Objective: BP 125/80   Pulse 79   SpO2 96%  Gen: Very pleasant 73-year-old Caucasian male, no acute distress HEENT: Profound cerumen impaction bilaterally.  Unable to visualize tympanic membrane, and most of the canal in either ear. CV: RRR, no murmur Resp: CTAB, no wheezes, non-labored Neuro: No nystagmus appreciated.  CN II through XII intact.  No focal neurologic deficit.  No gait abnormality.   Assessment and plan:  Peripheral vertigo Given the positional nature of patient's balance problems and dizziness, likely peripheral vertigo.  Most likely etiology is due to otolith.  Patient not a candidate for Antivert due to age.  Will refer to neurovestibular rehab.  We will also get CMP to rule out any electrolyte abnormality.  Remote consideration could be labyrinthitis or Mnire's disease but patient felt be less likely due to history.  Of note patient has profound cerumen impaction bilaterally.  This is of itself can be causing his symptoms.  As patient he would like for me to disimpact in office.  He prefer to try Debrox drops and follow-up if needed.   Orders Placed This Encounter  Procedures  . Comprehensive metabolic panel    Order Specific Question:   Has the patient fasted?    Answer:   No  . Ambulatory Referral to Neuro Rehab    Referral  Priority:   Routine    Referral Type:   Consultation    Number of Visits Requested:   1    No orders of the defined types were placed in this encounter.    Guadalupe Dawn MD PGY-3 Family Medicine Resident  11/29/2018 11:46 PM

## 2018-11-29 NOTE — Assessment & Plan Note (Signed)
Given the positional nature of patient's balance problems and dizziness, likely peripheral vertigo.  Most likely etiology is due to otolith.  Patient not a candidate for Antivert due to age.  Will refer to neurovestibular rehab.  We will also get CMP to rule out any electrolyte abnormality.  Remote consideration could be labyrinthitis or Mnire's disease but patient felt be less likely due to history.  Of note patient has profound cerumen impaction bilaterally.  This is of itself can be causing his symptoms.  As patient he would like for me to disimpact in office.  He prefer to try Debrox drops and follow-up if needed.

## 2018-12-03 ENCOUNTER — Other Ambulatory Visit: Payer: Self-pay | Admitting: Gastroenterology

## 2018-12-12 ENCOUNTER — Encounter: Payer: Self-pay | Admitting: Physical Therapy

## 2018-12-12 ENCOUNTER — Ambulatory Visit: Payer: Medicare HMO | Attending: Family Medicine | Admitting: Physical Therapy

## 2018-12-12 ENCOUNTER — Other Ambulatory Visit: Payer: Self-pay

## 2018-12-12 DIAGNOSIS — R293 Abnormal posture: Secondary | ICD-10-CM

## 2018-12-12 DIAGNOSIS — R42 Dizziness and giddiness: Secondary | ICD-10-CM

## 2018-12-12 DIAGNOSIS — M6281 Muscle weakness (generalized): Secondary | ICD-10-CM | POA: Insufficient documentation

## 2018-12-12 DIAGNOSIS — R2681 Unsteadiness on feet: Secondary | ICD-10-CM | POA: Diagnosis not present

## 2018-12-12 DIAGNOSIS — M542 Cervicalgia: Secondary | ICD-10-CM | POA: Diagnosis not present

## 2018-12-12 DIAGNOSIS — R262 Difficulty in walking, not elsewhere classified: Secondary | ICD-10-CM | POA: Diagnosis not present

## 2018-12-12 NOTE — Therapy (Signed)
[facility] [address] [place], [place], [address] Phone: [phone]   Fax:  [phone]  Physical Therapy Evaluation  Patient Details  Name: Jeffrey [name] MRN: [id number] Date of Birth: 1945-07-04 Referring Provider (PT): Guadalupe Dawn, MD; Blane Ohara McDiarmid, MD   Encounter Date: 12/12/2018   CLINIC OPERATION CHANGES: Outpatient Neuro Rehab is open at lower capacity following universal masking, social distancing, and patient screening.  The patient's COVID risk of complications score is 6.   PT End of Session - 12/12/18 1243    Visit Number  1    Number of Visits  17    Date for PT Re-Evaluation  02/10/19    Authorization Type  [name] Medicare and Medicaid; $40 copay after using all Medicaid visits.  VL: Prior Authorization.  10th visit PN    PT Start Time  1105    PT Stop Time  1200    PT Time Calculation (min)  55 min    Activity Tolerance  Patient tolerated treatment well    Behavior During Therapy  WFL for tasks assessed/performed       Past Medical History:  Diagnosis Date  . Colon polyps   . GERD (gastroesophageal reflux disease)   . HIV (human immunodeficiency virus infection) ([place]) 1999    Past Surgical History:  Procedure Laterality Date  . CHOLECYSTECTOMY  1982  . ESOPHAGEAL MANOMETRY N/A 09/03/2017   Procedure: ESOPHAGEAL MANOMETRY (EM);  Surgeon: Mauri Pole, MD;  Location: [facility];  Service: Endoscopy;  Laterality: N/A;  . KNEE SURGERY     LEFT KNEE  . TONSILLECTOMY      There were no vitals filed for this visit.   Subjective Assessment - 12/12/18 1109    Subjective  Pt reports that dizziness and balance impairments began in April when he would get up in the morning and when he was walking he would veer to the R.  Feels it is getting worse since April.  Pt also has arthritis in neck and shoulders.  Pt does get intermittent numbness and tingling in LUE; also reports difficulty  with fine motor activities in R hand.  Denies infections that required antibiotics.  No recent changes in hearing or vision.  No numbness or tingling down into the legs.  Denies changes in bowel or bladder.    Pertinent History  : HIV, diastolic CHF, back pain, HTN, and CKD    Limitations  Walking;Standing    Diagnostic tests  None recently    Patient Stated Goals  Find out why I am staggering, is it permanent?    Currently in Pain?  Yes    Pain Score  3     Pain Location  Neck    Pain Orientation  Right    Pain Descriptors / Indicators  Tightness;Sore    Pain Type  Acute pain    Aggravating Factors   did yard work, dragging brush yesterday         [facility] PT Assessment - 12/12/18 1118      Assessment   Medical Diagnosis  dizziness and balance impairments    Referring Provider (PT)  Guadalupe Dawn, MD; Blane Ohara McDiarmid, MD    Onset Date/Surgical Date  08/07/18    Hand Dominance  Left;Right    Prior Therapy  no      Precautions   Precautions  Other (comment)    Precaution Comments  : HIV, diastolic CHF, back pain, HTN, and CKD  Balance Screen   Has the patient fallen in the past 6 months  No      [place] residence    Living Arrangements  Alone    Additional Comments  Having difficulty with RUE reaching behind back to wash his back      Prior Function   Level of [place] work for everyone      Observation/Other Assessments   Focus on Therapeutic Outcomes (FOTO)   Not assessed      Sensation   Light Touch  Appears Intact;Impaired Detail    Light Touch Impaired Details  Impaired RLE   R peroneal nn distribution     Coordination   Gross Motor Movements are Fluid and Coordinated  No    Fine Motor Movements are Fluid and Coordinated  No    Coordination and Movement Description  Rapid alternating movements delayed on R    Finger Nose Finger Test  delayed; decreased accuracy on R    Heel Shin  Test  decreased accuracy on R      Posture/Postural Control   Posture/Postural Control  Postural limitations    Postural Limitations  Rounded Shoulders;Forward head      ROM / Strength   AROM / PROM / Strength  Strength;AROM      AROM   Overall AROM   Deficits    AROM Assessment Site  Cervical    Cervical Flexion  30    Cervical Extension  35    Cervical - Right Side Bend  35    Cervical - Left Side Bend  10    Cervical - Right Rotation  20    Cervical - Left Rotation  10      Strength   Overall Strength  Deficits    Overall Strength Comments  LE: bilat hip flexion 4-/5 with inconsistent activation; knee extension 4+/5, knee flexion 4+/5, ankle DF 4+/5 L, 4-/5 R with inconsistent activation    Strength Assessment Site  Shoulder;Elbow;Hand;Hip;Knee;Ankle    Right/Left Shoulder  Right;Left    Right Shoulder Flexion  3+/5    Right Shoulder Internal Rotation  4-/5   painful   Right Shoulder External Rotation  4-/5   painful   Left Shoulder Flexion  4+/5    Left Shoulder Internal Rotation  4+/5    Left Shoulder External Rotation  4+/5    Right/Left Elbow  Right;Left    Right Elbow Flexion  4-/5    Left Elbow Flexion  4+/5    Right/Left hand  Right;Left    Right Hand Grip (lbs)  60    Right Hand 3 Point Pinch  6 lbs    Left Hand Grip (lbs)  60    Left Hand 3 Point Pinch  8 lbs      Ambulation/Gait   Ambulation/Gait  Yes    Ambulation/Gait Assistance  6: Modified independent (Device/Increase time)    Ambulation Distance (Feet)  115 Feet    Assistive device  None    Gait Pattern  Step-through pattern;Wide base of support   veering to L and R   Ambulation Surface  Level;Indoor           Vestibular Assessment - 12/12/18 1144      Symptom Behavior   Type of Dizziness   Imbalance    Frequency of Dizziness  daily    Duration of Dizziness  N/A    Symptom  Nature  Motion provoked    Aggravating Factors  Mornings;Turning body quickly;Turning head quickly    Progression  of Symptoms  Worse      Oculomotor Exam   Oculomotor Alignment  Normal    Spontaneous  Absent    Gaze-induced   Absent    Smooth Pursuits  Saccades    Saccades  Slow;Undershoots    Comment  Convervence impaired; Test of Skew: negative      Oculomotor Exam-Fixation Suppressed    Left Head Impulse  unable to assess at this time    Right Head Impulse  unable to assess at this time      Vestibulo-Ocular Reflex   VOR to Slow Head Movement  Comment    VOR Cancellation  Comment    Comment  Difficult to assess due to decreased cervical ROM      Positional Testing   Sidelying Test  Sidelying Right;Sidelying Left      Sidelying Right   Sidelying Right Duration  0    Sidelying Right Symptoms  No nystagmus      Sidelying Left   Sidelying Left Duration  0    Sidelying Left Symptoms  No nystagmus          Objective measurements completed on examination: See above findings.              PT Education - 12/12/18 1242    Education Details  Clinical findings, PT POC, goals and possible referral to OT    Person(s) Educated  Patient    Methods  Explanation    Comprehension  Verbalized understanding       PT Short Term Goals - 12/12/18 1251      PT SHORT TERM GOAL #1   Title  Pt will participate in further assessment of gait and balance with FGA and SOT    Time  4    Period  Weeks    Status  New    Target Date  01/11/19      PT SHORT TERM GOAL #2   Title  Pt will report 25% greater use of RUE to wash his back and </= 3/10 pain in R shoulder with IR/ER    Time  4    Period  Weeks    Status  New    Target Date  01/11/19      PT SHORT TERM GOAL #3   Title  Pt will improve cervical ROM by 5-8 degrees in all movements    Baseline  see flow sheet    Time  4    Period  Weeks    Status  New    Target Date  01/11/19      PT SHORT TERM GOAL #4   Title  Pt will participate in more thorough vestibular evaluation due to improvement in overall cervical ROM    Time  4     Period  Weeks    Status  New    Target Date  01/11/19        PT Long Term Goals - 12/12/18 1259      PT LONG TERM GOAL #1   Title  Pt will demonstrate independence with final HEP - cervical ROM, UE/LE strength, balance, vestibular    Time  8    Period  Weeks    Status  New    Target Date  02/10/19      PT LONG TERM GOAL #2   Title  Pt will demonstrate 10-12  degree improvement in cervical spine ROM    Baseline  see flowsheets    Time  8    Period  Weeks    Status  New    Target Date  02/10/19      PT LONG TERM GOAL #3   Title  Pt will demonstrate improvement in RUE and RLE strength by one mm grade (MMT)    Baseline  see flowsheet    Time  8    Period  Weeks    Status  New    Target Date  02/10/19      PT LONG TERM GOAL #4   Title  Pt will demonstrate decreased falls risk as indicated by 4 point improvement in FGA    Baseline  TBD    Time  8    Period  Weeks    Status  New    Target Date  02/10/19      PT LONG TERM GOAL #5   Title  Pt will demonstrate SOT composite score to WNL for age group    Baseline  TBD    Time  8    Period  Weeks    Status  New    Target Date  02/10/19      Additional Long Term Goals   Additional Long Term Goals  Yes      PT LONG TERM GOAL #6   Title  Vestibular goal if needed             Plan - 12/12/18 1244    Clinical Impression Statement  Pt is a 73 year old male referred to Neuro OPPT for evaluation of vertigo and balance impairments.  Pt's PMH is significant for the following: HIV, diastolic CHF, back pain, HTN, and CKD. The following deficits were noted during pt's exam: impaired ROM in cervical spine, impaired UE and LE strength R > L weakness, impaired gross and fine motor coordination, impaired sensation, impaired posture, disequilibrium, impaired oculomotor exam, impaired balance and gait placing pt at increased risk for falls.  Full vestibular evaluation limited due to significant mm guarding in neck and decreased ROM.   Pt does not present with true vertigo and was negative for positional vertigo when assessed in sidelying position.  Pt would benefit from skilled PT to address these impairments and functional limitations to maximize functional mobility independence and reduce falls risk.    Personal Factors and Comorbidities  Comorbidity 3+;Finances;Time since onset of injury/illness/exacerbation    Comorbidities  HIV, diastolic CHF, back pain, HTN, and CKD    Examination-Activity Limitations  Bathing;Locomotion Level;Reach Overhead    Examination-Participation Restrictions  Driving;Yard Work    [employer]  Evolving/Moderate complexity    Clinical Decision Making  Moderate    Rehab Potential  Good    PT Frequency  2x / week    PT Duration  8 weeks    PT Treatment/Interventions  ADLs/Self Care Home Management;Canalith Repostioning;Cryotherapy;Moist Heat;Gait training;Functional mobility training;Therapeutic activities;Therapeutic exercise;Balance training;Neuromuscular re-education;Patient/family education;Manual techniques;Passive range of motion;Dry needling;Taping;Vestibular;Spinal Manipulations    PT Next Visit Plan  Continue balance assessments:FGA, SOT.  Re-set goals.  Initiate manual therapy and HEP for neck/shoulder ROM.    PT Home Exercise Plan  RUE and RLE strengthening, cervical ROM, balance, vestibular    Recommended Other Services  dry needling, OT?    Consulted and Agree with Plan of Care  Patient       Patient will benefit from skilled therapeutic intervention in order to improve  the following deficits and impairments:  Decreased balance, Decreased coordination, Decreased mobility, Decreased range of motion, Decreased strength, Difficulty walking, Dizziness, Impaired flexibility, Impaired sensation, Impaired UE functional use, Pain  Visit Diagnosis: 1. Dizziness and giddiness   2. Unsteadiness on feet   3. Muscle weakness (generalized)   4. Cervicalgia   5. Abnormal  posture   6. Difficulty in walking, not elsewhere classified        Problem List Patient Active Problem List   Diagnosis Date Noted  . Peripheral vertigo 11/29/2018  . Encounter for health maintenance examination in adult 12/20/2017  . Chronic kidney disease 12/20/2017  . Diastolic CHF with preserved left ventricular function, NYHA class 2 ([place]) 01/06/2015  . Muscle spasm of back 10/15/2014  . Exertional dyspnea 09/30/2014  . Insomnia 07/22/2014  . BPH with obstruction/lower urinary tract symptoms 06/17/2014  . Screening for colon cancer 10/07/2013  . Left ankle pain 08/07/2013  . Dyslipidemia 07/22/2012  . Erectile dysfunction 07/22/2012  . GERD 05/17/2010  . Dysphagia 03/08/2009  . CERVICAL LYMPHADENOPATHY 09/19/2007  . CHOLECYSTECTOMY, HX OF 05/28/2006  . Human immunodeficiency virus (HIV) disease ([place]) 02/27/2006  . Essential hypertension 02/27/2006  . PNEUMONIA, HX OF 02/27/2006  . ARTHROSCOPY, KNEE, HX OF 02/27/2006    Rico Junker, PT, DPT 12/12/18    1:05 PM    [facility] [address] [place], [place], [address] Phone: [phone]   Fax:  [phone]  Name: LESLEY GALENTINE MRN: [id number] Date of Birth: 1946/01/15

## 2018-12-19 ENCOUNTER — Ambulatory Visit: Payer: Medicare HMO | Admitting: Rehabilitative and Restorative Service Providers"

## 2018-12-26 ENCOUNTER — Other Ambulatory Visit: Payer: Self-pay

## 2018-12-26 ENCOUNTER — Ambulatory Visit: Payer: Medicare HMO | Admitting: Physical Therapy

## 2018-12-26 DIAGNOSIS — R262 Difficulty in walking, not elsewhere classified: Secondary | ICD-10-CM

## 2018-12-26 DIAGNOSIS — R2681 Unsteadiness on feet: Secondary | ICD-10-CM

## 2018-12-26 DIAGNOSIS — M542 Cervicalgia: Secondary | ICD-10-CM

## 2018-12-26 DIAGNOSIS — M6281 Muscle weakness (generalized): Secondary | ICD-10-CM

## 2018-12-26 DIAGNOSIS — R42 Dizziness and giddiness: Secondary | ICD-10-CM | POA: Diagnosis not present

## 2018-12-26 DIAGNOSIS — R293 Abnormal posture: Secondary | ICD-10-CM | POA: Diagnosis not present

## 2018-12-26 NOTE — Patient Instructions (Signed)
Access Code: [email or web address]  URL: [email or web address]  Date: 12/26/2018  Prepared by: Misty Stanley   Exercises Supine Thoracic Mobilization Towel Roll Vertical - 5 minute hold - 1x daily - 7x weekly Upper Trapezius Stretch - 3 sets - 30 second hold - 1x daily - 7x weekly Supine Scapular Retraction - 10 reps - 1 sets - 5 seconds hold - 1x daily - 7x weekly Supine Cervical Retraction with Towel - 10 reps - 1 sets - 3 second hold - 1x daily - 7x weekly Lying down: isometric cervical rotation - 10 reps - 1 sets - 5 second hold - 1x daily - 7x weekly Supine Cervical Rotation AROM on Pillow - 10 reps - 2 sets - 1x daily - 7x weekly

## 2018-12-26 NOTE — Therapy (Signed)
[facility] [address] [place], [place], [address] Phone: [phone]   Fax:  [phone]  Physical Therapy Treatment  Patient Details  Name: Jeffrey [name] MRN: [id number] Date of Birth: 11-Aug-1945 Referring Provider (PT): Guadalupe Dawn, MD; Blane Ohara McDiarmid, MD   Encounter Date: 12/26/2018  PT End of Session - 12/26/18 2101    Visit Number  2    Number of Visits  17    Date for PT Re-Evaluation  02/10/19    Authorization Type  [name] Medicare and Medicaid; $40 copay after using all Medicaid visits.  VL: Prior Authorization.  10th visit PN    PT Start Time  1529    PT Stop Time  1615    PT Time Calculation (min)  46 min    Activity Tolerance  Patient tolerated treatment well    Behavior During Therapy  WFL for tasks assessed/performed       Past Medical History:  Diagnosis Date  . Colon polyps   . GERD (gastroesophageal reflux disease)   . HIV (human immunodeficiency virus infection) ([place]) 1999    Past Surgical History:  Procedure Laterality Date  . CHOLECYSTECTOMY  1982  . ESOPHAGEAL MANOMETRY N/A 09/03/2017   Procedure: ESOPHAGEAL MANOMETRY (EM);  Surgeon: Mauri Pole, MD;  Location: [facility];  Service: Endoscopy;  Laterality: N/A;  . KNEE SURGERY     LEFT KNEE  . TONSILLECTOMY      There were no vitals filed for this visit.  Subjective Assessment - 12/26/18 1529    Subjective  No change since eval.  Everything is about the same.  Had to cancel last week's session due to waking and getting sick to his stomach.  Mornings are still the worst.    Pertinent History  : HIV, diastolic CHF, back pain, HTN, and CKD    Limitations  Walking;Standing    Diagnostic tests  None recently    Patient Stated Goals  Find out why I am staggering, is it permanent?    Currently in Pain?  Yes    Pain Location  Shoulder    Pain Orientation  Right    Pain Descriptors / Indicators  Aching         [facility] PT  Assessment - 12/26/18 1531      Functional Gait  Assessment   Gait assessed   Yes    Gait Level Surface  Walks 20 ft in less than 7 sec but greater than 5.5 sec, uses assistive device, slower speed, mild gait deviations, or deviates 6-10 in outside of the 12 in walkway width.    Change in Gait Speed  Able to change speed, demonstrates mild gait deviations, deviates 6-10 in outside of the 12 in walkway width, or no gait deviations, unable to achieve a major change in velocity, or uses a change in velocity, or uses an assistive device.    Gait with Horizontal Head Turns  Performs head turns smoothly with slight change in gait velocity (eg, minor disruption to smooth gait path), deviates 6-10 in outside 12 in walkway width, or uses an assistive device.    Gait with Vertical Head Turns  Performs task with moderate change in gait velocity, slows down, deviates 10-15 in outside 12 in walkway width but recovers, can continue to walk.    Gait and Pivot Turn  Pivot turns safely in greater than 3 sec and stops with no loss of balance, or pivot turns safely within 3 sec  and stops with mild imbalance, requires small steps to catch balance.    Step Over Obstacle  Is able to step over one shoe box (4.5 in total height) but must slow down and adjust steps to clear box safely. May require verbal cueing.    Gait with Narrow Base of Support  Ambulates less than 4 steps heel to toe or cannot perform without assistance.    Gait with Eyes Closed  Walks 20 ft, slow speed, abnormal gait pattern, evidence for imbalance, deviates 10-15 in outside 12 in walkway width. Requires more than 9 sec to ambulate 20 ft.    Ambulating Backwards  Walks 20 ft, uses assistive device, slower speed, mild gait deviations, deviates 6-10 in outside 12 in walkway width.    Steps  Alternating feet, must use rail.    Total Score  15    FGA comment:  15/30                   [facility] Adult PT Treatment/Exercise - 12/26/18 1541       Exercises   Exercises  Neck      Neck Exercises: Supine   Cervical Isometrics  Right rotation;Left rotation;5 secs;10 reps    Cervical Isometrics Limitations  in supine    Neck Retraction  10 reps;5 secs    Neck Retraction Limitations  with two and then one pillow after performing suboccipital release    Capital Flexion  10 reps    Capital Flexion Limitations  combined with cervical rotation in supine    Cervical Rotation  Right;Left;5 reps    Cervical Rotation Limitations  rotation with 10 reps capital flexion (small nods)    Other Supine Exercise  Supine scapular retraction with 5 second hold x 10 reps lying on towel roll for thoracic mobilization      Manual Therapy   Manual Therapy  Manual Traction;Myofascial release    Manual therapy comments  Performed suboccipital release with slight manual traction x 5 minutes to relieve suboccipital tension felt by patient when performing cervical retraction      Neck Exercises: Stretches   Upper Trapezius Stretch  Right;3 reps;30 seconds    Upper Trapezius Stretch Limitations  supine       Access Code: [email or web address]  URL: [email or web address]  Date: 12/26/2018  Prepared by: Misty Stanley   Exercises Supine Thoracic Mobilization Towel Roll Vertical - 5 minute hold - 1x daily - 7x weekly Upper Trapezius Stretch - 3 sets - 30 second hold - 1x daily - 7x weekly Supine Scapular Retraction - 10 reps - 1 sets - 5 seconds hold - 1x daily - 7x weekly Supine Cervical Retraction with Towel - 10 reps - 1 sets - 3 second hold - 1x daily - 7x weekly Lying down: isometric cervical rotation - 10 reps - 1 sets - 5 second hold - 1x daily - 7x weekly Supine Cervical Rotation AROM on Pillow - 10 reps - 2 sets - 1x daily - 7x weekly       PT Education - 12/26/18 2101    Education Details  results of FGA and initial cervical ROM and isometric strengthening in supine    Person(s) Educated  Patient    Methods  Explanation;Demonstration;Handout     Comprehension  Verbalized understanding;Returned demonstration       PT Short Term Goals - 12/26/18 2102      PT SHORT TERM GOAL #1   Title  Pt will participate in further assessment of gait  and balance with FGA and SOT    Time  4    Period  Weeks    Status  Achieved    Target Date  01/11/19      PT SHORT TERM GOAL #2   Title  Pt will report 25% greater use of RUE to wash his back and </= 3/10 pain in R shoulder with IR/ER    Time  4    Period  Weeks    Status  New    Target Date  01/11/19      PT SHORT TERM GOAL #3   Title  Pt will improve cervical ROM by 5-8 degrees in all movements    Baseline  see flow sheet    Time  4    Period  Weeks    Status  New    Target Date  01/11/19      PT SHORT TERM GOAL #4   Title  Pt will participate in more thorough vestibular evaluation due to improvement in overall cervical ROM    Time  4    Period  Weeks    Status  New    Target Date  01/11/19        PT Long Term Goals - 12/26/18 2103      PT LONG TERM GOAL #1   Title  Pt will demonstrate independence with final HEP - cervical ROM, UE/LE strength, balance, vestibular  (ALL LTG DUE BY 02/10/2019)    Time  8    Period  Weeks    Status  New      PT LONG TERM GOAL #2   Title  Pt will demonstrate 10-12 degree improvement in cervical spine ROM    Baseline  see flowsheets    Time  8    Period  Weeks    Status  New      PT LONG TERM GOAL #3   Title  Pt will demonstrate improvement in RUE and RLE strength by one mm grade (MMT)    Baseline  see flowsheet    Time  8    Period  Weeks    Status  New      PT LONG TERM GOAL #4   Title  Pt will demonstrate decreased falls risk as indicated by 4 point improvement in FGA    Baseline  15/30    Time  8    Period  Weeks    Status  Revised      PT LONG TERM GOAL #5   Title  Pt will demonstrate SOT composite score to WNL for age group    Baseline  deferred    Time  8    Period  Weeks    Status  Deferred      PT LONG TERM GOAL  #6   Title  Vestibular goal if needed            Plan - 12/26/18 2104    Clinical Impression Statement  Treatment session focused on continued assessment of balance impairments during gait with FGA with pt demonstrating high falls risk.  Initiated supine cervical ROM and isometric strengthening with pt reporting area of tension from suboccipital region down to R shoulder.  HEP provided to pt. Will continue to address and progress towards LTG.    Personal Factors and Comorbidities  Comorbidity 3+;Finances;Time since onset of injury/illness/exacerbation    Comorbidities  HIV, diastolic CHF, back pain, HTN, and CKD    Examination-Activity Limitations  Bathing;Locomotion  Level;Reach Overhead    Examination-Participation Restrictions  Driving;Yard Work    [employer] officer  Evolving/Moderate complexity    Rehab Potential  Good    PT Frequency  2x / week    PT Duration  8 weeks    PT Treatment/Interventions  ADLs/Self Care Home Management;Canalith Repostioning;Cryotherapy;Moist Heat;Gait training;Functional mobility training;Therapeutic activities;Therapeutic exercise;Balance training;Neuromuscular re-education;Patient/family education;Manual techniques;Passive range of motion;Taping;Vestibular;Spinal Manipulations    PT Next Visit Plan  (No TDN due to medical history of HIV) check HEP and progress; incorporate scapular strength and stability; balance    PT Home Exercise Plan  RUE and RLE strengthening, cervical ROM, balance, vestibular    Consulted and Agree with Plan of Care  Patient       Patient will benefit from skilled therapeutic intervention in order to improve the following deficits and impairments:  Decreased balance, Decreased coordination, Decreased mobility, Decreased range of motion, Decreased strength, Difficulty walking, Dizziness, Impaired flexibility, Impaired sensation, Impaired UE functional use, Pain  Visit Diagnosis: Dizziness and  giddiness  Unsteadiness on feet  Muscle weakness (generalized)  Cervicalgia  Abnormal posture  Difficulty in walking, not elsewhere classified     Problem List Patient Active Problem List   Diagnosis Date Noted  . Peripheral vertigo 11/29/2018  . Encounter for health maintenance examination in adult 12/20/2017  . Chronic kidney disease 12/20/2017  . Diastolic CHF with preserved left ventricular function, NYHA class 2 ([place]) 01/06/2015  . Muscle spasm of back 10/15/2014  . Exertional dyspnea 09/30/2014  . Insomnia 07/22/2014  . BPH with obstruction/lower urinary tract symptoms 06/17/2014  . Screening for colon cancer 10/07/2013  . Left ankle pain 08/07/2013  . Dyslipidemia 07/22/2012  . Erectile dysfunction 07/22/2012  . GERD 05/17/2010  . Dysphagia 03/08/2009  . CERVICAL LYMPHADENOPATHY 09/19/2007  . CHOLECYSTECTOMY, HX OF 05/28/2006  . Human immunodeficiency virus (HIV) disease ([place]) 02/27/2006  . Essential hypertension 02/27/2006  . PNEUMONIA, HX OF 02/27/2006  . ARTHROSCOPY, KNEE, HX OF 02/27/2006    Rico Junker, PT, DPT 12/26/18    9:09 PM    [facility] [address] [place], [place], [address] Phone: [phone]   Fax:  [phone]  Name: Jeffrey [name] MRN: [id number] Date of Birth: [birth year]

## 2018-12-30 ENCOUNTER — Encounter: Payer: Self-pay | Admitting: Physical Therapy

## 2018-12-30 ENCOUNTER — Other Ambulatory Visit: Payer: Self-pay

## 2018-12-30 ENCOUNTER — Ambulatory Visit: Payer: Medicare HMO | Admitting: Physical Therapy

## 2018-12-30 DIAGNOSIS — R293 Abnormal posture: Secondary | ICD-10-CM

## 2018-12-30 DIAGNOSIS — M6281 Muscle weakness (generalized): Secondary | ICD-10-CM | POA: Diagnosis not present

## 2018-12-30 DIAGNOSIS — R42 Dizziness and giddiness: Secondary | ICD-10-CM

## 2018-12-30 DIAGNOSIS — R2681 Unsteadiness on feet: Secondary | ICD-10-CM | POA: Diagnosis not present

## 2018-12-30 DIAGNOSIS — R262 Difficulty in walking, not elsewhere classified: Secondary | ICD-10-CM

## 2018-12-30 DIAGNOSIS — M542 Cervicalgia: Secondary | ICD-10-CM | POA: Diagnosis not present

## 2018-12-30 NOTE — Therapy (Signed)
[facility] [address] [place], [place], [address] Phone: [phone]   Fax:  [phone]  Physical Therapy Treatment  Patient Details  Name: Jeffrey [name] MRN: [id number] Date of Birth: Sep 02, 1945 Referring Provider (PT): Guadalupe Dawn, MD; Blane Ohara McDiarmid, MD   Encounter Date: 12/30/2018  PT End of Session - 12/30/18 1654    Visit Number  3    Number of Visits  17    Date for PT Re-Evaluation  02/10/19    Authorization Type  [name] Medicare and Medicaid; $40 copay after using all Medicaid visits.  VL: Prior Authorization.  10th visit PN    PT Start Time  1445    PT Stop Time  1530    PT Time Calculation (min)  45 min    Activity Tolerance  Patient tolerated treatment well    Behavior During Therapy  WFL for tasks assessed/performed       Past Medical History:  Diagnosis Date  . Colon polyps   . GERD (gastroesophageal reflux disease)   . HIV (human immunodeficiency virus infection) ([place]) 1999    Past Surgical History:  Procedure Laterality Date  . CHOLECYSTECTOMY  1982  . ESOPHAGEAL MANOMETRY N/A 09/03/2017   Procedure: ESOPHAGEAL MANOMETRY (EM);  Surgeon: Mauri Pole, MD;  Location: [facility];  Service: Endoscopy;  Laterality: N/A;  . KNEE SURGERY     LEFT KNEE  . TONSILLECTOMY      There were no vitals filed for this visit.  Subjective Assessment - 12/30/18 1453    Subjective  No issues to report over the weekend.  Still having some dizziness first thing in the morning.  Performed the exercises on Friday and Saturday and feels he can turn his head a little more to the left today.  Used to use treadmill at [employer].    Pertinent History  HIV, diastolic CHF, back pain, HTN, and CKD    Limitations  Walking;Standing    Diagnostic tests  None recently    Patient Stated Goals  Find out why I am staggering, is it permanent?    Currently in Pain?  Yes                        [facility] Adult PT Treatment/Exercise - 12/30/18 1521      Exercises   Exercises  Knee/Hip      Neck Exercises: Supine   Shoulder ABduction  Both;Other reps (comment)   8 reps   Shoulder Abduction Limitations  horizontal ABD with green theraband pulling apart for scapular retraction    Other Supine Exercise  Shoulder external rotation to L and R alternating with green theraband x 8 reps total adding in neck rotation to L and R with ER      Knee/Hip Exercises: Aerobic   Tread Mill  @ 5mh x 6 minutes with bilat UE support demonstrating decreased step length and mild SOB at end of 6 minutes.  No dizziness reported      Neck Exercises: Stretches   Upper Trapezius Stretch  Right;Left;2 reps;30 seconds    Upper Trapezius Stretch Limitations  seated    Chest Stretch  2 reps;20 seconds   arm on wall, rotating away            PT Education - 12/30/18 1652    Education Details  will add aerobic exercise into therapy treatments with treadmill, seated stretches for upper trap and standing stretch for pec  Person(s) Educated  Patient    Methods  Explanation    Comprehension  Verbalized understanding;Returned demonstration       PT Short Term Goals - 12/26/18 2102      PT SHORT TERM GOAL #1   Title  Pt will participate in further assessment of gait and balance with FGA and SOT    Time  4    Period  Weeks    Status  Achieved    Target Date  01/11/19      PT SHORT TERM GOAL #2   Title  Pt will report 25% greater use of RUE to wash his back and </= 3/10 pain in R shoulder with IR/ER    Time  4    Period  Weeks    Status  New    Target Date  01/11/19      PT SHORT TERM GOAL #3   Title  Pt will improve cervical ROM by 5-8 degrees in all movements    Baseline  see flow sheet    Time  4    Period  Weeks    Status  New    Target Date  01/11/19      PT SHORT TERM GOAL #4   Title  Pt will participate in more thorough vestibular evaluation due to  improvement in overall cervical ROM    Time  4    Period  Weeks    Status  New    Target Date  01/11/19        PT Long Term Goals - 12/26/18 2103      PT LONG TERM GOAL #1   Title  Pt will demonstrate independence with final HEP - cervical ROM, UE/LE strength, balance, vestibular  (ALL LTG DUE BY 02/10/2019)    Time  8    Period  Weeks    Status  New      PT LONG TERM GOAL #2   Title  Pt will demonstrate 10-12 degree improvement in cervical spine ROM    Baseline  see flowsheets    Time  8    Period  Weeks    Status  New      PT LONG TERM GOAL #3   Title  Pt will demonstrate improvement in RUE and RLE strength by one mm grade (MMT)    Baseline  see flowsheet    Time  8    Period  Weeks    Status  New      PT LONG TERM GOAL #4   Title  Pt will demonstrate decreased falls risk as indicated by 4 point improvement in FGA    Baseline  15/30    Time  8    Period  Weeks    Status  Revised      PT LONG TERM GOAL #5   Title  Pt will demonstrate SOT composite score to WNL for age group    Baseline  deferred    Time  8    Period  Weeks    Status  Deferred      PT LONG TERM GOAL #6   Title  Vestibular goal if needed            Plan - 12/30/18 1654    Clinical Impression Statement  Incorporated aerobic conditioning into patient's treatment today with treadmill as this was patient's main form of aerobic exercise prior to quarantine; discussed importance of blood flow and oxygen for tissue healing and pain reduction.  Continued to review stretches to increase available ROM in cervical/thoracic spine and shoulder to prepare for vestibular treatments.  Initiated scapular muscle strengthening with theraband; will continue to review and will update HEP next session.    Personal Factors and Comorbidities  Comorbidity 3+;Finances;Time since onset of injury/illness/exacerbation    Comorbidities  HIV, diastolic CHF, back pain, HTN, and CKD    Examination-Activity Limitations   Bathing;Locomotion Level;Reach Overhead    Examination-Participation Restrictions  Driving;Yard Work    [employer]chant [employer] officer  Evolving/Moderate complexity    Rehab Potential  Good    PT Frequency  2x / week    PT Duration  8 weeks    PT Treatment/Interventions  ADLs/Self Care Home Management;Canalith Repostioning;Cryotherapy;Moist Heat;Gait training;Functional mobility training;Therapeutic activities;Therapeutic exercise;Balance training;Neuromuscular re-education;Patient/family education;Manual techniques;Passive range of motion;Taping;Vestibular;Spinal Manipulations    PT Next Visit Plan  (No TDN due to medical history of HIV) add to HEP pec stretch, seated trap stretch, supine scapular strengthening with head movements, balance    PT Home Exercise Plan  RUE and RLE strengthening, cervical ROM, balance, vestibular    Consulted and Agree with Plan of Care  Patient       Patient will benefit from skilled therapeutic intervention in order to improve the following deficits and impairments:  Decreased balance, Decreased coordination, Decreased mobility, Decreased range of motion, Decreased strength, Difficulty walking, Dizziness, Impaired flexibility, Impaired sensation, Impaired UE functional use, Pain  Visit Diagnosis: Dizziness and giddiness  Unsteadiness on feet  Muscle weakness (generalized)  Cervicalgia  Abnormal posture  Difficulty in walking, not elsewhere classified     Problem List Patient Active Problem List   Diagnosis Date Noted  . Peripheral vertigo 11/29/2018  . Encounter for health maintenance examination in adult 12/20/2017  . Chronic kidney disease 12/20/2017  . Diastolic CHF with preserved left ventricular function, NYHA class 2 ([place]) 01/06/2015  . Muscle spasm of back 10/15/2014  . Exertional dyspnea 09/30/2014  . Insomnia 07/22/2014  . BPH with obstruction/lower urinary tract symptoms 06/17/2014  . Screening for colon cancer 10/07/2013  .  Left ankle pain 08/07/2013  . Dyslipidemia 07/22/2012  . Erectile dysfunction 07/22/2012  . GERD 05/17/2010  . Dysphagia 03/08/2009  . CERVICAL LYMPHADENOPATHY 09/19/2007  . CHOLECYSTECTOMY, HX OF 05/28/2006  . Human immunodeficiency virus (HIV) disease ([place]) 02/27/2006  . Essential hypertension 02/27/2006  . PNEUMONIA, HX OF 02/27/2006  . ARTHROSCOPY, KNEE, HX OF 02/27/2006    Rico Junker, PT, DPT 12/30/18    4:58 PM    [facility] [address] [place], [place], [address] Phone: [phone]   Fax:  [phone]  Name: JAYKO VOORHEES MRN: [id number] Date of Birth: 12/19/1945

## 2019-01-01 ENCOUNTER — Telehealth: Payer: Self-pay | Admitting: Physical Therapy

## 2019-01-01 ENCOUNTER — Ambulatory Visit: Payer: Medicare HMO | Admitting: Physical Therapy

## 2019-01-01 ENCOUNTER — Other Ambulatory Visit: Payer: Self-pay

## 2019-01-01 ENCOUNTER — Encounter: Payer: Self-pay | Admitting: Physical Therapy

## 2019-01-01 DIAGNOSIS — R262 Difficulty in walking, not elsewhere classified: Secondary | ICD-10-CM | POA: Diagnosis not present

## 2019-01-01 DIAGNOSIS — M542 Cervicalgia: Secondary | ICD-10-CM

## 2019-01-01 DIAGNOSIS — R2681 Unsteadiness on feet: Secondary | ICD-10-CM

## 2019-01-01 DIAGNOSIS — R293 Abnormal posture: Secondary | ICD-10-CM

## 2019-01-01 DIAGNOSIS — M6281 Muscle weakness (generalized): Secondary | ICD-10-CM | POA: Diagnosis not present

## 2019-01-01 DIAGNOSIS — R42 Dizziness and giddiness: Secondary | ICD-10-CM | POA: Diagnosis not present

## 2019-01-01 NOTE — Patient Instructions (Addendum)
Side Pull: Double Arm    On back, knees bent, feet flat. Arms perpendicular to body, shoulder level, elbows straight but relaxed. Pull arms out to sides, elbows straight. Resistance band comes across collarbones, hands toward floor. Hold momentarily. Slowly return to starting position. Repeat _10__ times. Band color __Green___     Sash    On back, knees bent, feet flat, left hand on left hip, right hand above left. Pull right arm DIAGONALLY up towards right side of head. Hold momentarily. Slowly return to starting position. Repeat 5 times each side.  Switch and Do with left arm 10 times. Band color ___Green___     Shoulder Rotation: Single Arm    On back, knees bent, feet flat, elbows tucked at sides, bent 90, hands palms up. Keep left arm still. Pull right hand out to side and down toward floor, keeping elbow near side. Hold momentarily. Slowly return to starting position. Repeat __10_ times total (5 per side, alternating) Band color __Green____     AND   Access Code: [email or web address]  URL: [email or web address]  Date: 12/26/2018  Prepared by: Misty Stanley   Exercises Supine Thoracic Mobilization Towel Roll Vertical - 5 minute hold - 1x daily - 7x weekly Upper Trapezius Stretch - 3 sets - 30 second hold - 1x daily - 7x weekly Supine Scapular Retraction - 10 reps - 1 sets - 5 seconds hold - 1x daily - 7x weekly Supine Cervical Retraction with Towel - 10 reps - 1 sets - 3 second hold - 1x daily - 7x weekly Lying down: isometric cervical rotation - 10 reps - 1 sets - 5 second hold - 1x daily - 7x weekly Supine Cervical Rotation AROM on Pillow - 10 reps - 2 sets - 1x daily - 7x weekly

## 2019-01-01 NOTE — Telephone Encounter (Addendum)
Hello Dr. McDiarmid,  I was working with Jeffrey [name] today in physical therapy and while ambulating at a slow speed on the treadmill he began to feel SOB.  When I checked his vitals I noticed some irregularity in his HR; 02 was WNL.  When I started asking him about his cardiac history he reported having chest pain with exertion since January, improved at rest and DOE which he did not report on eval even when he told me he had been using the treadmill at [employer] before the gym closed.  He said he had reported this to his PCP but it had never been addressed.  I corrected him on this because he did see a cardiologist in 2016 for the exact same symptoms and they found diastolic CHF.    He then stated that he feels his chest pain and DOE have become more frequent this year and he would like to follow up with cardiology to see if his cardiac function had changed since 2016.  I told him that since it had been >3 years he would probably need a new referral from you and that you may want to see him in the office to discuss.    Please let me know what you think about this and if you have any questions.  Thank you, Jeffrey [name], PT, DPT 01/01/19    10:57 AM

## 2019-01-01 NOTE — Therapy (Signed)
[facility] [address] [place], [place], [address] Phone: [phone]   Fax:  [phone]  Physical Therapy Treatment  Patient Details  Name: Jeffrey [name] MRN: [id number] Date of Birth: March 15, 1946 Referring Provider (PT): Guadalupe Dawn, MD; Blane Ohara McDiarmid, MD   Encounter Date: 01/01/2019  PT End of Session - 01/01/19 1029    Visit Number  4    Number of Visits  17    Date for PT Re-Evaluation  02/10/19    Authorization Type  [name] Medicare and Medicaid; $40 copay after using all Medicaid visits.  VL: Prior Authorization.  10th visit PN    PT Start Time  0845    PT Stop Time  0930    PT Time Calculation (min)  45 min    Activity Tolerance  Patient tolerated treatment well    Behavior During Therapy  WFL for tasks assessed/performed       Past Medical History:  Diagnosis Date  . Colon polyps   . GERD (gastroesophageal reflux disease)   . HIV (human immunodeficiency virus infection) ([place]) 1999    Past Surgical History:  Procedure Laterality Date  . CHOLECYSTECTOMY  1982  . ESOPHAGEAL MANOMETRY N/A 09/03/2017   Procedure: ESOPHAGEAL MANOMETRY (EM);  Surgeon: Mauri Pole, MD;  Location: [facility];  Service: Endoscopy;  Laterality: N/A;  . KNEE SURGERY     LEFT KNEE  . TONSILLECTOMY      There were no vitals filed for this visit.  Subjective Assessment - 01/01/19 0850    Subjective  Pt is feeling better in the evening but still wakes up with stiffness; discussed performing stretches in the AM after getting in a hot shower.  Has a hard time finding the right type of pillow.    Pertinent History  HIV, diastolic CHF, back pain, HTN, and CKD    Limitations  Walking;Standing    Diagnostic tests  None recently    Patient Stated Goals  Find out why I am staggering, is it permanent?    Currently in Pain?  Yes                       [facility] Adult PT Treatment/Exercise - 01/01/19 0852       Exercises   Exercises  Knee/Hip      Knee/Hip Exercises: Aerobic   Tread Mill  1.2 mph x 7 minutes with bilat UE support and focus on increased step and stride length.  Assessed Sp02 while on treadmill with 02 reading 98% but HR initially reading 32 and then jumping to 83; manual HR 73 bpm but intermittent skipping of one beat every 10 beats.  Pt does exhibit DOE but did not experience dizziness until treadmill stopped.   Once in sitting pt HR normalized at 74 without irregularity; Sp02 98%.  Pt also reports intermittent chest pain since beginning of the year when performing exerting energy; 1 episode of chest pain at rest when watching TV.  Notices it a couple times a week; symptoms mitigate if he rests.  Reports that pain stays in L chest and does not radiate.         Side Pull: Double Arm    On back, knees bent, feet flat. Arms perpendicular to body, shoulder level, elbows straight but relaxed. Pull arms out to sides, elbows straight. Resistance band comes across collarbones, hands toward floor. Hold momentarily. Slowly return to starting position. Repeat _10__ times. Band color __Green___  Sash    On back, knees bent, feet flat, left hand on left hip, right hand above left. Pull right arm DIAGONALLY up towards right side of head. Hold momentarily. Slowly return to starting position. Repeat 5 times each side.  Switch and Do with left arm 10 times. Band color ___Green___     Shoulder Rotation: Single Arm    On back, knees bent, feet flat, elbows tucked at sides, bent 90, hands palms up. Keep left arm still. Pull right hand out to side and down toward floor, keeping elbow near side. Hold momentarily. Slowly return to starting position. Repeat __10_ times total (5 per side, alternating) Band color __Green____   Copyright  VHI. All rights reserved.           PT Education - 01/01/19 1027    Education Details  Reviewed scapular strengthening, UE strengthening and  postural exercises with theraband for HEP, will alert PCP to return of cardiac symptoms and request f/u with cardiologist since he has not been seen in 4 years    Person(s) Educated  Patient    Methods  Explanation;Demonstration;Handout    Comprehension  Verbalized understanding;Returned demonstration       PT Short Term Goals - 12/26/18 2102      PT SHORT TERM GOAL #1   Title  Pt will participate in further assessment of gait and balance with FGA and SOT    Time  4    Period  Weeks    Status  Achieved    Target Date  01/11/19      PT SHORT TERM GOAL #2   Title  Pt will report 25% greater use of RUE to wash his back and </= 3/10 pain in R shoulder with IR/ER    Time  4    Period  Weeks    Status  New    Target Date  01/11/19      PT SHORT TERM GOAL #3   Title  Pt will improve cervical ROM by 5-8 degrees in all movements    Baseline  see flow sheet    Time  4    Period  Weeks    Status  New    Target Date  01/11/19      PT SHORT TERM GOAL #4   Title  Pt will participate in more thorough vestibular evaluation due to improvement in overall cervical ROM    Time  4    Period  Weeks    Status  New    Target Date  01/11/19        PT Long Term Goals - 12/26/18 2103      PT LONG TERM GOAL #1   Title  Pt will demonstrate independence with final HEP - cervical ROM, UE/LE strength, balance, vestibular  (ALL LTG DUE BY 02/10/2019)    Time  8    Period  Weeks    Status  New      PT LONG TERM GOAL #2   Title  Pt will demonstrate 10-12 degree improvement in cervical spine ROM    Baseline  see flowsheets    Time  8    Period  Weeks    Status  New      PT LONG TERM GOAL #3   Title  Pt will demonstrate improvement in RUE and RLE strength by one mm grade (MMT)    Baseline  see flowsheet    Time  8    Period  Weeks  Status  New      PT LONG TERM GOAL #4   Title  Pt will demonstrate decreased falls risk as indicated by 4 point improvement in FGA    Baseline  15/30    Time   8    Period  Weeks    Status  Revised      PT LONG TERM GOAL #5   Title  Pt will demonstrate SOT composite score to WNL for age group    Baseline  deferred    Time  8    Period  Weeks    Status  Deferred      PT LONG TERM GOAL #6   Title  Vestibular goal if needed            Plan - 01/01/19 1029    Clinical Impression Statement  Continued to incorporate aerobic conditioning increasing time on treadmill to 7 minutes and focusing on longer step length - when HR assessed, slight irregularity noted in pulse - when therapist questioned patient he also reported having intermittent chest pain with exertion and DOE.  Pt reports he has reported this to his PCP but could not recall it ever being addressed and stated, "the doctor didn't seem too concerned about it."  When chart reviewed reminded pt about his visit to cardiologist in 2016 due to the same symptoms pt describing today and results of exercise testing.  Pt feels the chest pain has become more frequent with exertion and pt feels states he would like a follow up with cardiologist to assess if his cardiac function has changed.  PT to alert PCP.  Treatment continued to review scapular and UE strengthening with theraband.  Pt tolerated well; will continue to address in order to progress towards LTG.    Personal Factors and Comorbidities  Comorbidity 3+;Finances;Time since onset of injury/illness/exacerbation    Comorbidities  HIV, diastolic CHF, back pain, HTN, and CKD    Examination-Activity Limitations  Bathing;Locomotion Level;Reach Overhead    Examination-Participation Restrictions  Driving;Yard Work    [employer]  Evolving/Moderate complexity    Rehab Potential  Good    PT Frequency  2x / week    PT Duration  8 weeks    PT Treatment/Interventions  ADLs/Self Care Home Management;Canalith Repostioning;Cryotherapy;Moist Heat;Gait training;Functional mobility training;Therapeutic activities;Therapeutic  exercise;Balance training;Neuromuscular re-education;Patient/family education;Manual techniques;Passive range of motion;Taping;Vestibular;Spinal Manipulations    PT Next Visit Plan  (No TDN due to medical history of HIV) add to HEP pec stretch, seated trap stretch.  Try to assess vestibular.  Did PCP send a note back about cardiac??  Initiate x1 viewing and corner balance    PT Home Exercise Plan  RUE and RLE strengthening, cervical ROM, balance, vestibular    Consulted and Agree with Plan of Care  Patient       Patient will benefit from skilled therapeutic intervention in order to improve the following deficits and impairments:  Decreased balance, Decreased coordination, Decreased mobility, Decreased range of motion, Decreased strength, Difficulty walking, Dizziness, Impaired flexibility, Impaired sensation, Impaired UE functional use, Pain  Visit Diagnosis: Dizziness and giddiness  Unsteadiness on feet  Muscle weakness (generalized)  Cervicalgia  Abnormal posture  Difficulty in walking, not elsewhere classified     Problem List Patient Active Problem List   Diagnosis Date Noted  . Peripheral vertigo 11/29/2018  . Encounter for health maintenance examination in adult 12/20/2017  . Chronic kidney disease 12/20/2017  . Diastolic CHF with preserved left ventricular function, NYHA class  2 ([place]) 01/06/2015  . Muscle spasm of back 10/15/2014  . Exertional dyspnea 09/30/2014  . Insomnia 07/22/2014  . BPH with obstruction/lower urinary tract symptoms 06/17/2014  . Screening for colon cancer 10/07/2013  . Left ankle pain 08/07/2013  . Dyslipidemia 07/22/2012  . Erectile dysfunction 07/22/2012  . GERD 05/17/2010  . Dysphagia 03/08/2009  . CERVICAL LYMPHADENOPATHY 09/19/2007  . CHOLECYSTECTOMY, HX OF 05/28/2006  . Human immunodeficiency virus (HIV) disease ([place]) 02/27/2006  . Essential hypertension 02/27/2006  . PNEUMONIA, HX OF 02/27/2006  . ARTHROSCOPY, KNEE, HX OF 02/27/2006     Rico Junker, PT, DPT 01/01/19    10:51 AM    [facility] [address] [place], [place], [address] Phone: [phone]   Fax:  [phone]  Name: Jeffrey [name] MRN: [id number] Date of Birth: [birth year]

## 2019-01-06 ENCOUNTER — Encounter: Payer: Self-pay | Admitting: Physical Therapy

## 2019-01-06 ENCOUNTER — Ambulatory Visit: Payer: Medicare HMO | Admitting: Physical Therapy

## 2019-01-06 ENCOUNTER — Other Ambulatory Visit: Payer: Self-pay

## 2019-01-06 DIAGNOSIS — M6281 Muscle weakness (generalized): Secondary | ICD-10-CM | POA: Diagnosis not present

## 2019-01-06 DIAGNOSIS — R262 Difficulty in walking, not elsewhere classified: Secondary | ICD-10-CM | POA: Diagnosis not present

## 2019-01-06 DIAGNOSIS — M542 Cervicalgia: Secondary | ICD-10-CM | POA: Diagnosis not present

## 2019-01-06 DIAGNOSIS — R2681 Unsteadiness on feet: Secondary | ICD-10-CM | POA: Diagnosis not present

## 2019-01-06 DIAGNOSIS — R293 Abnormal posture: Secondary | ICD-10-CM

## 2019-01-06 DIAGNOSIS — R42 Dizziness and giddiness: Secondary | ICD-10-CM | POA: Diagnosis not present

## 2019-01-06 NOTE — Therapy (Signed)
[facility] [address] [place], [place], [address] Phone: [phone]   Fax:  [phone]  Physical Therapy Treatment  Patient Details  Name: Jeffrey [name] MRN: [id number] Date of Birth: [birth year] Referring Provider (PT): Guadalupe Dawn, MD; Blane Ohara McDiarmid, MD   Encounter Date: 01/06/2019  PT End of Session - 01/06/19 1658    Visit Number  5    Number of Visits  17    Date for PT Re-Evaluation  02/10/19    Authorization Type  [name] Medicare and Medicaid; $40 copay after using all Medicaid visits.  VL: Prior Authorization.  10th visit PN    PT Start Time  1450    PT Stop Time  1535    PT Time Calculation (min)  45 min    Activity Tolerance  Patient tolerated treatment well    Behavior During Therapy  WFL for tasks assessed/performed       Past Medical History:  Diagnosis Date  . Colon polyps   . GERD (gastroesophageal reflux disease)   . HIV (human immunodeficiency virus infection) ([place]) 1999    Past Surgical History:  Procedure Laterality Date  . CHOLECYSTECTOMY  1982  . ESOPHAGEAL MANOMETRY N/A 09/03/2017   Procedure: ESOPHAGEAL MANOMETRY (EM);  Surgeon: Mauri Pole, MD;  Location: [facility];  Service: Endoscopy;  Laterality: N/A;  . KNEE SURGERY     LEFT KNEE  . TONSILLECTOMY      There were no vitals filed for this visit.  Subjective Assessment - 01/06/19 1453    Subjective  This weekend did some weed eating and sawing tall branches and has a little soreness in shoulders today.  Still stiff when he wakes up but taking a hot shower and stretching does help it feel better.    Pertinent History  HIV, diastolic CHF, back pain, HTN, and CKD    Limitations  Walking;Standing    Diagnostic tests  None recently    Patient Stated Goals  Find out why I am staggering, is it permanent?    Currently in Pain?  No/denies             Vestibular Assessment - 01/06/19 1509      Oculomotor  Exam-Fixation Suppressed    Left Head Impulse  negative    Right Head Impulse  negative      Vestibulo-Ocular Reflex   VOR Cancellation  Normal      Positional Sensitivities   Nose to Right Knee  No dizziness    Right Knee to Sitting  Lightedness    Nose to Left Knee  No dizziness    Left Knee to Sitting  No dizziness    Head Turning x 5  No dizziness    Head Nodding x 5  No dizziness    Pivot Right in Standing  No dizziness   off balance   Pivot Left in Standing  No dizziness   off balance              [facility] Adult PT Treatment/Exercise - 01/06/19 1517      Neck Exercises: Supine   Other Supine Exercise  tennis ball suboccipital release in supine with two ball performing rolling side to side and chin tucks to move ball vertically along muscles      Neck Exercises: Stretches   Corner Stretch  2 reps;30 seconds   in doorway - pec stretch       Balance Exercises - 01/06/19 1527  Balance Exercises: Standing   Standing Eyes Closed  Narrow base of support (BOS);Head turns;Solid surface;Other reps (comment)   10 reps up and down, side to side       PT Education - 01/06/19 1657    Education Details  updated HEP and added pec stretch, corner balance and tennis ball suboccipital release.  Discussed holding treadmill until PT speaks with physician regarding symptoms last session (irregular heart beat, chest pain, DOE).    Person(s) Educated  Patient    Methods  Explanation;Demonstration;Handout    Comprehension  Verbalized understanding;Returned demonstration      Side Pull: Double Arm    On back, knees bent, feet flat. Arms perpendicular to body, shoulder level, elbows straight but relaxed. Pull arms out to sides, elbows straight. Resistance band comes across collarbones, hands toward floor. Hold momentarily. Slowly return to starting position. Repeat _10__ times. Band color __Green___     Sash    On back, knees bent, feet flat, left hand on left hip,  right hand above left. Pull right arm DIAGONALLY up towards right side of head. Hold momentarily. Slowly return to starting position. Repeat 5 times each side.  Switch and Do with left arm 10 times. Band color ___Green___     Shoulder Rotation: Single Arm    On back, knees bent, feet flat, elbows tucked at sides, bent 90, hands palms up. Keep left arm still. Pull right hand out to side and down toward floor, keeping elbow near side. Hold momentarily. Slowly return to starting position. Repeat __10_ times total (5 per side, alternating) Band color __Green____     AND   Access Code: [email or web address]  URL: [email or web address]  Date: 01/06/2019  Prepared by: Misty Stanley   Exercises Supine Thoracic Mobilization Towel Roll Vertical - 5 minute hold - 1x daily - 7x weekly Upper Trapezius Stretch - 3 sets - 30 second hold - 1x daily - 7x weekly Supine Scapular Retraction - 10 reps - 1 sets - 5 seconds hold - 1x daily - 7x weekly Supine Cervical Retraction with Towel - 10 reps - 1 sets - 3 second hold - 1x daily - 7x weekly Lying down: isometric cervical rotation - 10 reps - 1 sets - 5 second hold - 1x daily - 7x weekly Supine Cervical Rotation AROM on Pillow - 10 reps - 2 sets - 1x daily - 7x weekly Doorway Pec Stretch at 60 Elevation - 3 sets - 30 seconds hold - 1x daily - 7x weekly Supine Suboccipital Release with Tennis Balls - 10 reps - 2 sets - 1x daily - 7x weekly Standing with Eyes Closed - 10 reps - 2 sets - 1x daily - 7x weekly   PT Short Term Goals - 12/26/18 2102      PT SHORT TERM GOAL #1   Title  Pt will participate in further assessment of gait and balance with FGA and SOT    Time  4    Period  Weeks    Status  Achieved    Target Date  01/11/19      PT SHORT TERM GOAL #2   Title  Pt will report 25% greater use of RUE to wash his back and </= 3/10 pain in R shoulder with IR/ER    Time  4    Period  Weeks    Status  New    Target Date  01/11/19      PT  SHORT TERM GOAL #3   Title  Pt  will improve cervical ROM by 5-8 degrees in all movements    Baseline  see flow sheet    Time  4    Period  Weeks    Status  New    Target Date  01/11/19      PT SHORT TERM GOAL #4   Title  Pt will participate in more thorough vestibular evaluation due to improvement in overall cervical ROM    Time  4    Period  Weeks    Status  New    Target Date  01/11/19        PT Long Term Goals - 12/26/18 2103      PT LONG TERM GOAL #1   Title  Pt will demonstrate independence with final HEP - cervical ROM, UE/LE strength, balance, vestibular  (ALL LTG DUE BY 02/10/2019)    Time  8    Period  Weeks    Status  New      PT LONG TERM GOAL #2   Title  Pt will demonstrate 10-12 degree improvement in cervical spine ROM    Baseline  see flowsheets    Time  8    Period  Weeks    Status  New      PT LONG TERM GOAL #3   Title  Pt will demonstrate improvement in RUE and RLE strength by one mm grade (MMT)    Baseline  see flowsheet    Time  8    Period  Weeks    Status  New      PT LONG TERM GOAL #4   Title  Pt will demonstrate decreased falls risk as indicated by 4 point improvement in FGA    Baseline  15/30    Time  8    Period  Weeks    Status  Revised      PT LONG TERM GOAL #5   Title  Pt will demonstrate SOT composite score to WNL for age group    Baseline  deferred    Time  8    Period  Weeks    Status  Deferred      PT LONG TERM GOAL #6   Title  Vestibular goal if needed            Plan - 01/06/19 1658    Clinical Impression Statement  Will continue to discuss patient's cardiac symptoms with PCP and seek further guidance - will hold on treadmill training for now.  Pt continues to report some soreness in shoulder and neck after performing yard work activities but pt is continuing to perform his exercises and is demonstrating increased active cervical ROM allowing PT to complete vestibular evaluation today. No significant findings with  vestibular eval - pt did not demonstrate significant motion sensitivity -mild lightheadedness with bending down to R and imbalance with pivoting to L and R.  Pt demonstrated [facility] HIT.  Continued to update HEP adding pectoralis stretch, suboccipital release with tennis balls and initiated multi-sensory balance training with corner balance.  Pt tolerated well; will continue to address in order to progress towards LTG.    Personal Factors and Comorbidities  Comorbidity 3+;Finances;Time since onset of injury/illness/exacerbation    Comorbidities  HIV, diastolic CHF, back pain, HTN, and CKD    Examination-Activity Limitations  Bathing;Locomotion Level;Reach Overhead    Examination-Participation Restrictions  Driving;Yard Work    Stability/Clinical Decision Making  Evolving/Moderate complexity    Rehab Potential  Good    PT Frequency  2x / week    PT Duration  8 weeks    PT Treatment/Interventions  ADLs/Self Care Home Management;Canalith Repostioning;Cryotherapy;Moist Heat;Gait training;Functional mobility training;Therapeutic activities;Therapeutic exercise;Balance training;Neuromuscular re-education;Patient/family education;Manual techniques;Passive range of motion;Taping;Vestibular;Spinal Manipulations    PT Next Visit Plan  (No TDN due to medical history of HIV)  Check and progress corner balance; work on balance reactions.   Check STG.  Did PCP send a note back about cardiac??    PT Home Exercise Plan  RUE and RLE strengthening, cervical ROM, balance, vestibular    Consulted and Agree with Plan of Care  Patient       Patient will benefit from skilled therapeutic intervention in order to improve the following deficits and impairments:  Decreased balance, Decreased coordination, Decreased mobility, Decreased range of motion, Decreased strength, Difficulty walking, Dizziness, Impaired flexibility, Impaired sensation, Impaired UE functional use, Pain  Visit Diagnosis: Dizziness and giddiness   Unsteadiness on feet  Muscle weakness (generalized)  Cervicalgia  Abnormal posture  Difficulty in walking, not elsewhere classified     Problem List Patient Active Problem List   Diagnosis Date Noted  . Peripheral vertigo 11/29/2018  . Encounter for health maintenance examination in adult 12/20/2017  . Chronic kidney disease 12/20/2017  . Diastolic CHF with preserved left ventricular function, NYHA class 2 ([place]) 01/06/2015  . Muscle spasm of back 10/15/2014  . Exertional dyspnea 09/30/2014  . Insomnia 07/22/2014  . BPH with obstruction/lower urinary tract symptoms 06/17/2014  . Screening for colon cancer 10/07/2013  . Left ankle pain 08/07/2013  . Dyslipidemia 07/22/2012  . Erectile dysfunction 07/22/2012  . GERD 05/17/2010  . Dysphagia 03/08/2009  . CERVICAL LYMPHADENOPATHY 09/19/2007  . CHOLECYSTECTOMY, HX OF 05/28/2006  . Human immunodeficiency virus (HIV) disease ([place]) 02/27/2006  . Essential hypertension 02/27/2006  . PNEUMONIA, HX OF 02/27/2006  . ARTHROSCOPY, KNEE, HX OF 02/27/2006    Rico Junker, PT, DPT 01/06/19    5:11 PM     [facility] [address] [place], [place], [address] Phone: [phone]   Fax:  [phone]  Name: JOVANN LUSE MRN: [id number] Date of Birth: January 18, 1946

## 2019-01-06 NOTE — Patient Instructions (Addendum)
Side Pull: Double Arm    On back, knees bent, feet flat. Arms perpendicular to body, shoulder level, elbows straight but relaxed. Pull arms out to sides, elbows straight. Resistance band comes across collarbones, hands toward floor. Hold momentarily. Slowly return to starting position. Repeat _10__ times. Band color __Green___     Sash    On back, knees bent, feet flat, left hand on left hip, right hand above left. Pull right arm DIAGONALLY up towards right side of head. Hold momentarily. Slowly return to starting position. Repeat 5 times each side.  Switch and Do with left arm 10 times. Band color ___Green___     Shoulder Rotation: Single Arm    On back, knees bent, feet flat, elbows tucked at sides, bent 90, hands palms up. Keep left arm still. Pull right hand out to side and down toward floor, keeping elbow near side. Hold momentarily. Slowly return to starting position. Repeat __10_ times total (5 per side, alternating) Band color __Green____     AND   Access Code: [email or web address]  URL: [email or web address]  Date: 01/06/2019  Prepared by: Misty Stanley   Exercises Supine Thoracic Mobilization Towel Roll Vertical - 5 minute hold - 1x daily - 7x weekly Upper Trapezius Stretch - 3 sets - 30 second hold - 1x daily - 7x weekly Supine Scapular Retraction - 10 reps - 1 sets - 5 seconds hold - 1x daily - 7x weekly Supine Cervical Retraction with Towel - 10 reps - 1 sets - 3 second hold - 1x daily - 7x weekly Lying down: isometric cervical rotation - 10 reps - 1 sets - 5 second hold - 1x daily - 7x weekly Supine Cervical Rotation AROM on Pillow - 10 reps - 2 sets - 1x daily - 7x weekly Doorway Pec Stretch at 60 Elevation - 3 sets - 30 seconds hold - 1x daily - 7x weekly Supine Suboccipital Release with Tennis Balls - 10 reps - 2 sets - 1x daily - 7x weekly Standing with Eyes Closed - 10 reps - 2 sets - 1x daily - 7x weekly

## 2019-01-07 NOTE — Telephone Encounter (Signed)
Ms Jeffrey [name]: Thank you for letting us know what is going on with [name].   I will ask our staff to contact him so that he may be scheduled to be seen at the [facility].    [facility] Red Team:  Please contact [name] to schedule him to see Dr Tarry Kos or another  red team physician for a new complaint of chest pain with exertion.    Preferably schedule the appointment within the next 1-2 weeks.

## 2019-01-07 NOTE — Telephone Encounter (Signed)
Pt scheduled with dr Rosita Fire in 2 weeks. Deseree Kennon Holter, CMA

## 2019-01-08 ENCOUNTER — Ambulatory Visit: Payer: Medicare HMO | Attending: Family Medicine | Admitting: Physical Therapy

## 2019-01-08 ENCOUNTER — Other Ambulatory Visit: Payer: Self-pay

## 2019-01-08 DIAGNOSIS — M6281 Muscle weakness (generalized): Secondary | ICD-10-CM | POA: Diagnosis not present

## 2019-01-08 DIAGNOSIS — R262 Difficulty in walking, not elsewhere classified: Secondary | ICD-10-CM | POA: Insufficient documentation

## 2019-01-08 DIAGNOSIS — R2681 Unsteadiness on feet: Secondary | ICD-10-CM | POA: Insufficient documentation

## 2019-01-08 DIAGNOSIS — R42 Dizziness and giddiness: Secondary | ICD-10-CM | POA: Insufficient documentation

## 2019-01-08 DIAGNOSIS — R293 Abnormal posture: Secondary | ICD-10-CM | POA: Diagnosis not present

## 2019-01-08 DIAGNOSIS — M542 Cervicalgia: Secondary | ICD-10-CM | POA: Insufficient documentation

## 2019-01-08 NOTE — Therapy (Addendum)
[facility] [address] [place], [place], [address] Phone: [phone]   Fax:  [phone]  Physical Therapy Treatment  Patient Details  Name: Jeffrey [name] MRN: [id number] Date of Birth: 07-Mar-1946 Referring Provider (PT): Guadalupe Dawn, MD; Blane Ohara McDiarmid, MD   Encounter Date: 01/08/2019  PT End of Session - 01/08/19 0913    Visit Number  6    Number of Visits  17    Date for PT Re-Evaluation  02/10/19    Authorization Type  [name] Medicare and Medicaid; $40 copay after using all Medicaid visits.  VL: Prior Authorization.  10th visit PN    PT Start Time  0804    PT Stop Time  0846    PT Time Calculation (min)  42 min    Activity Tolerance  Patient tolerated treatment well    Behavior During Therapy  [facility] for tasks assessed/performed       Past Medical History:  Diagnosis Date  . Colon polyps   . GERD (gastroesophageal reflux disease)   . HIV (human immunodeficiency virus infection) ([place]) 1999    Past Surgical History:  Procedure Laterality Date  . CHOLECYSTECTOMY  1982  . ESOPHAGEAL MANOMETRY N/A 09/03/2017   Procedure: ESOPHAGEAL MANOMETRY (EM);  Surgeon: Mauri Pole, MD;  Location: [facility];  Service: Endoscopy;  Laterality: N/A;  . KNEE SURGERY     LEFT KNEE  . TONSILLECTOMY      There were no vitals filed for this visit.  Subjective Assessment - 01/08/19 0810    Subjective  Pt reports being able to turn his more to the L, especially while driving.  Still having a harder time turning to the R.  Felt a little pulling across mid back with the doorway stretch; corner exercise is going well.  Has an appointment with PCP to discuss referral to cardiology.    Pertinent History  HIV, diastolic CHF, back pain, HTN, and CKD    Limitations  Walking;Standing    Diagnostic tests  None recently    Patient Stated Goals  Find out why I am staggering, is it permanent?    Currently in Pain?  No/denies          [facility] PT Assessment - 01/08/19 0819      ROM / Strength   AROM / PROM / Strength  AROM;Strength      AROM   Overall AROM   Deficits    Overall AROM Comments  L shoulder no pain with IR and can reach to middle of scapula; R shoulder 5/10 pain reaching to inferior angle of scapula    Cervical Flexion  55    Cervical Extension  45    Cervical - Right Side Bend  35    Cervical - Left Side Bend  20    Cervical - Right Rotation  45    Cervical - Left Rotation  40                   [facility] Adult PT Treatment/Exercise - 01/08/19 0907      Exercises   Exercises  Shoulder      Shoulder Exercises: Prone   Retraction  Strengthening;Both;10 reps;AROM    Retraction Limitations  with UE in goal post formation performing retraction and thoracic extension    Horizontal ABduction 1  AROM;Strengthening;Right;Left;5 reps    Horizontal ABduction 1 Limitations  horizontal ABD, retraction and trunk rotation to ipsilateral side x 5 reps each side  to increase thoracic rotation.      Manual Therapy   Manual Therapy  Joint mobilization    Joint Mobilization  Grade II PA mobilizations to upper and mid thoracic spine and unilaterally along transverse processes to increase extension and rotation.      Neck Exercises: Stretches   Corner Stretch  2 reps;30 seconds   verbal cues for technique and keep elbows down            PT Education - 01/08/19 0912    Education Details  reviewed doorway stretch and changed to staggered stance leaning forwards for improved control of shoulder position during stretch; progress towards STG    Person(s) Educated  Patient    Methods  Explanation;Demonstration    Comprehension  Verbalized understanding;Returned demonstration       PT Short Term Goals - 01/08/19 0817      PT SHORT TERM GOAL #1   Title  Pt will participate in further assessment of gait and balance with FGA and SOT    Time  4    Period  Weeks    Status  Achieved    Target  Date  01/11/19      PT SHORT TERM GOAL #2   Title  Pt will report 25% greater use of RUE to wash his back and </= 3/10 pain in R shoulder with IR/ER    Baseline  5/10 R shoulder pain with IR to reach behind back - able to reach to inferior tip of scapula    Time  4    Period  Weeks    Status  Partially Met    Target Date  01/11/19      PT SHORT TERM GOAL #3   Title  Pt will improve cervical ROM by 5-8 degrees in all movements    Baseline  see flow sheet    Time  4    Period  Weeks    Status  Achieved    Target Date  01/11/19      PT SHORT TERM GOAL #4   Title  Pt will participate in more thorough vestibular evaluation due to improvement in overall cervical ROM    Time  4    Period  Weeks    Status  Achieved    Target Date  01/11/19        PT Long Term Goals - 12/26/18 2103      PT LONG TERM GOAL #1   Title  Pt will demonstrate independence with final HEP - cervical ROM, UE/LE strength, balance, vestibular  (ALL LTG DUE BY 02/10/2019)    Time  8    Period  Weeks    Status  New      PT LONG TERM GOAL #2   Title  Pt will demonstrate 10-12 degree improvement in cervical spine ROM    Baseline  see flowsheets    Time  8    Period  Weeks    Status  New      PT LONG TERM GOAL #3   Title  Pt will demonstrate improvement in RUE and RLE strength by one mm grade (MMT)    Baseline  see flowsheet    Time  8    Period  Weeks    Status  New      PT LONG TERM GOAL #4   Title  Pt will demonstrate decreased falls risk as indicated by 4 point improvement in FGA    Baseline  15/30  Time  8    Period  Weeks    Status  Revised      PT LONG TERM GOAL #5   Title  Pt will demonstrate SOT composite score to WNL for age group    Baseline  deferred    Time  8    Period  Weeks    Status  Deferred      PT LONG TERM GOAL #6   Title  Vestibular goal if needed            Plan - 01/08/19 0914    Clinical Impression Statement  Pt is making steady progress and has met 3/4 STG,  partially meeting one goal.  Pt is demonstrating significantly increased cervical ROM and R shoulder ROM but continues to report 5/10 pain with various functional movements.  Pt is reporting improvement in functional ROM for driving and ADL.  Improved cervical ROM did allow PT to perform full vestibular assessment and initiated standing balance HEP.  Continued to address cervical-thoracic spinal mobility with prone mobilizations and adding in prone extension and rotation to further increase ROM at thoracic spine.  Reviewed anterior chest stretch for home.  Will continue to address and progress towards LTG.    Personal Factors and Comorbidities  Comorbidity 3+;Finances;Time since onset of injury/illness/exacerbation    Comorbidities  HIV, diastolic CHF, back pain, HTN, and CKD    Examination-Activity Limitations  Bathing;Locomotion Level;Reach Overhead    Examination-Participation Restrictions  Driving;Yard Work    [employer] officer  Evolving/Moderate complexity    Rehab Potential  Good    PT Frequency  2x / week    PT Duration  8 weeks    PT Treatment/Interventions  ADLs/Self Care Home Management;Canalith Repostioning;Cryotherapy;Moist Heat;Gait training;Functional mobility training;Therapeutic activities;Therapeutic exercise;Balance training;Neuromuscular re-education;Patient/family education;Manual techniques;Passive range of motion;Taping;Vestibular;Spinal Manipulations    PT Next Visit Plan  (No TDN due to medical history of HIV)  How did he feel after manual therapy?  Check and progress corner balance, shoulder ROM and strengthening; work on balance reactions/rockerboard    PT Home Exercise Plan  RUE and RLE strengthening, cervical ROM, balance, vestibular    Consulted and Agree with Plan of Care  Patient       Patient will benefit from skilled therapeutic intervention in order to improve the following deficits and impairments:  Decreased balance, Decreased coordination,  Decreased mobility, Decreased range of motion, Decreased strength, Difficulty walking, Dizziness, Impaired flexibility, Impaired sensation, Impaired UE functional use, Pain  Visit Diagnosis: Dizziness and giddiness  Unsteadiness on feet  Muscle weakness (generalized)  Cervicalgia  Abnormal posture  Difficulty in walking, not elsewhere classified     Problem List Patient Active Problem List   Diagnosis Date Noted  . Peripheral vertigo 11/29/2018  . Encounter for health maintenance examination in adult 12/20/2017  . Chronic kidney disease 12/20/2017  . Diastolic CHF with preserved left ventricular function, NYHA class 2 ([employer]) 01/06/2015  . Muscle spasm of back 10/15/2014  . Exertional dyspnea 09/30/2014  . Insomnia 07/22/2014  . BPH with obstruction/lower urinary tract symptoms 06/17/2014  . Screening for colon cancer 10/07/2013  . Left ankle pain 08/07/2013  . Dyslipidemia 07/22/2012  . Erectile dysfunction 07/22/2012  . GERD 05/17/2010  . Dysphagia 03/08/2009  . CERVICAL LYMPHADENOPATHY 09/19/2007  . CHOLECYSTECTOMY, HX OF 05/28/2006  . Human immunodeficiency virus (HIV) disease ([place]) 02/27/2006  . Essential hypertension 02/27/2006  . PNEUMONIA, HX OF 02/27/2006  . ARTHROSCOPY, KNEE, HX OF 02/27/2006    Tilda Burrow  Melrose Nakayama, PT, DPT 01/08/19    9:19 AM    [facility] [address] [place], [place], [address] Phone: [phone]   Fax:  [phone]  Name: Jeffrey [name] MRN: [id number] Date of Birth: Jan 21, 1946

## 2019-01-08 NOTE — Patient Instructions (Signed)
Side Pull: Double Arm    On back, knees bent, feet flat. Arms perpendicular to body, shoulder level, elbows straight but relaxed. Pull arms out to sides, elbows straight. Resistance band comes across collarbones, hands toward floor. Hold momentarily. Slowly return to starting position. Repeat _10__ times. Band color __Green___     Sash    On back, knees bent, feet flat, left hand on left hip, right hand above left. Pull right arm DIAGONALLY up towards right side of head. Hold momentarily. Slowly return to starting position. Repeat 5 times each side.  Switch and Do with left arm 10 times. Band color ___Green___     Shoulder Rotation: Single Arm    On back, knees bent, feet flat, elbows tucked at sides, bent 90, hands palms up. Keep left arm still. Pull right hand out to side and down toward floor, keeping elbow near side. Hold momentarily. Slowly return to starting position. Repeat __10_ times total (5 per side, alternating) Band color __Green____     AND   Access Code: [email or web address]  URL: [email or web address]  Date: 01/06/2019  Prepared by: Misty Stanley   Exercises Supine Thoracic Mobilization Towel Roll Vertical - 5 minute hold - 1x daily - 7x weekly Upper Trapezius Stretch - 3 sets - 30 second hold - 1x daily - 7x weekly Supine Scapular Retraction - 10 reps - 1 sets - 5 seconds hold - 1x daily - 7x weekly Supine Cervical Retraction with Towel - 10 reps - 1 sets - 3 second hold - 1x daily - 7x weekly Lying down: isometric cervical rotation - 10 reps - 1 sets - 5 second hold - 1x daily - 7x weekly Supine Cervical Rotation AROM on Pillow - 10 reps - 2 sets - 1x daily - 7x weekly Doorway Pec Stretch at 60 Elevation - 3 sets - 30 seconds hold - 1x daily - 7x weekly  - make sure to stagger your stance and lean forward on front LE to stretch anterior chest Supine Suboccipital Release with Tennis Balls - 10 reps - 2 sets - 1x daily - 7x weekly Standing with Eyes Closed  - 10 reps - 2 sets - 1x daily - 7x weekly

## 2019-01-13 ENCOUNTER — Other Ambulatory Visit: Payer: Self-pay | Admitting: Gastroenterology

## 2019-01-15 ENCOUNTER — Ambulatory Visit: Payer: Medicare HMO | Admitting: Physical Therapy

## 2019-01-15 ENCOUNTER — Other Ambulatory Visit: Payer: Self-pay

## 2019-01-15 ENCOUNTER — Encounter: Payer: Self-pay | Admitting: Physical Therapy

## 2019-01-15 DIAGNOSIS — M542 Cervicalgia: Secondary | ICD-10-CM

## 2019-01-15 DIAGNOSIS — R42 Dizziness and giddiness: Secondary | ICD-10-CM

## 2019-01-15 DIAGNOSIS — R262 Difficulty in walking, not elsewhere classified: Secondary | ICD-10-CM | POA: Diagnosis not present

## 2019-01-15 DIAGNOSIS — R2681 Unsteadiness on feet: Secondary | ICD-10-CM | POA: Diagnosis not present

## 2019-01-15 DIAGNOSIS — R293 Abnormal posture: Secondary | ICD-10-CM

## 2019-01-15 DIAGNOSIS — M6281 Muscle weakness (generalized): Secondary | ICD-10-CM

## 2019-01-15 NOTE — Therapy (Signed)
[facility] [address] [place], [place], [address] Phone: [phone]   Fax:  [phone]  Physical Therapy Treatment  Patient Details  Name: Jeffrey [name] MRN: [id number] Date of Birth: 01/26/1946 Referring Provider (PT): Guadalupe Dawn, MD; Blane Ohara McDiarmid, MD   Encounter Date: 01/15/2019  PT End of Session - 01/15/19 1206    Visit Number  7    Number of Visits  17    Date for PT Re-Evaluation  02/10/19    Authorization Type  [name] Medicare and Medicaid; $40 copay after using all Medicaid visits.  VL: Prior Authorization.  10th visit PN    PT Start Time  [phone]    PT Stop Time  1019    PT Time Calculation (min)  45 min    Activity Tolerance  Patient tolerated treatment well    Behavior During Therapy  WFL for tasks assessed/performed       Past Medical History:  Diagnosis Date  . Colon polyps   . GERD (gastroesophageal reflux disease)   . HIV (human immunodeficiency virus infection) ([place]) 1999    Past Surgical History:  Procedure Laterality Date  . CHOLECYSTECTOMY  1982  . ESOPHAGEAL MANOMETRY N/A 09/03/2017   Procedure: ESOPHAGEAL MANOMETRY (EM);  Surgeon: Mauri Pole, MD;  Location: [facility];  Service: Endoscopy;  Laterality: N/A;  . KNEE SURGERY     LEFT KNEE  . TONSILLECTOMY      There were no vitals filed for this visit.  Subjective Assessment - 01/15/19 0936    Subjective  Did a lot of work outside this weekend, felt good yesterday but is feeling more stiff this morning.  Tolerated manual therapy and exercises well last time.    Pertinent History  HIV, diastolic CHF, back pain, HTN, and CKD    Limitations  Walking;Standing    Diagnostic tests  None recently    Patient Stated Goals  Find out why I am staggering, is it permanent?    Currently in Pain?  No/denies                       [facility] Adult PT Treatment/Exercise - 01/15/19 1009      Neck Exercises: Supine   Other Supine Exercise  Reviewed tennis ball suboccipital release in supine but with tennis balls wrapped in towel to maintain placement    Other Supine Exercise  Reviewed towel SNAGs with rotation to L and R in supine and then in sitting.  Pt felt the mobilization to be more effective in sitting          Balance Exercises - 01/15/19 1014      Balance Exercises: Standing   Standing Eyes Opened  Narrow base of support (BOS);Head turns;Solid surface;5 reps   staggered stance, diagonal head movements   Standing Eyes Closed  Narrow base of support (BOS);Head turns;Solid surface;Other reps (comment)   feet together, 10 reps head nods/turns   Tandem Gait  Forward;Upper extremity support;3 reps      Access Code: [email or web address]  URL: [email or web address]  Date: 01/15/2019  Prepared by: Misty Stanley   Exercises Supine Thoracic Mobilization Towel Roll Vertical - 5 minute hold - 1x daily - 7x weekly Upper Trapezius Stretch - 3 sets - 30 second hold - 1x daily - 7x weekly Supine Scapular Retraction - 10 reps - 1 sets - 5 seconds hold - 1x daily - 7x weekly Supine Cervical Retraction with Towel - 10 reps -  1 sets - 3 second hold - 1x daily - 7x weekly Lying down: isometric cervical rotation - 10 reps - 1 sets - 5 second hold - 1x daily - 7x weekly Doorway Pec Stretch at 60 Elevation - 3 sets - 30 seconds hold - 1x daily - 7x weekly Supine Suboccipital Release with Tennis Balls - 10 reps - 2 sets - 1x daily - 7x weekly Seated Assisted Cervical Rotation with Towel - 3 sets - 15 seconds hold - 1x daily - 7x weekly Standing with Eyes Closed - 10 reps - 2 sets - 1x daily - 7x weekly Staggered stance with Eyes Open - 10 reps - 3 sets - 1x daily - 7x weekly Walking Tandem Stance - 4 sets - 1x daily - 7x weekly    PT Education - 01/15/19 1204    Education Details  updated cervical ROM HEP and balance HEP    Person(s) Educated  Patient    Methods  Explanation;Demonstration;Handout     Comprehension  Returned demonstration;Verbalized understanding       PT Short Term Goals - 01/08/19 0817      PT SHORT TERM GOAL #1   Title  Pt will participate in further assessment of gait and balance with FGA and SOT    Time  4    Period  Weeks    Status  Achieved    Target Date  01/11/19      PT SHORT TERM GOAL #2   Title  Pt will report 25% greater use of RUE to wash his back and </= 3/10 pain in R shoulder with IR/ER    Baseline  5/10 R shoulder pain with IR to reach behind back - able to reach to inferior tip of scapula    Time  4    Period  Weeks    Status  Partially Met    Target Date  01/11/19      PT SHORT TERM GOAL #3   Title  Pt will improve cervical ROM by 5-8 degrees in all movements    Baseline  see flow sheet    Time  4    Period  Weeks    Status  Achieved    Target Date  01/11/19      PT SHORT TERM GOAL #4   Title  Pt will participate in more thorough vestibular evaluation due to improvement in overall cervical ROM    Time  4    Period  Weeks    Status  Achieved    Target Date  01/11/19        PT Long Term Goals - 12/26/18 2103      PT LONG TERM GOAL #1   Title  Pt will demonstrate independence with final HEP - cervical ROM, UE/LE strength, balance, vestibular  (ALL LTG DUE BY 02/10/2019)    Time  8    Period  Weeks    Status  New      PT LONG TERM GOAL #2   Title  Pt will demonstrate 10-12 degree improvement in cervical spine ROM    Baseline  see flowsheets    Time  8    Period  Weeks    Status  New      PT LONG TERM GOAL #3   Title  Pt will demonstrate improvement in RUE and RLE strength by one mm grade (MMT)    Baseline  see flowsheet    Time  8    Period  Weeks    Status  New      PT LONG TERM GOAL #4   Title  Pt will demonstrate decreased falls risk as indicated by 4 point improvement in FGA    Baseline  15/30    Time  8    Period  Weeks    Status  Revised      PT LONG TERM GOAL #5   Title  Pt will demonstrate SOT composite  score to WNL for age group    Baseline  deferred    Time  8    Period  Weeks    Status  Deferred      PT LONG TERM GOAL #6   Title  Vestibular goal if needed            Plan - 01/15/19 1208    Clinical Impression Statement  Continued to review and revise current cervical ROM and standing balance HEP to improve effectiveness of exercises.  Continued to progress corner balance exercises adding in diagonal head movements due to improved cervical ROM and added gait with narrow BOS.  Pt tolerated well with no increase in symptoms.  Will continue to address and progress towards LTG.    Personal Factors and Comorbidities  Comorbidity 3+;Finances;Time since onset of injury/illness/exacerbation    Comorbidities  HIV, diastolic CHF, back pain, HTN, and CKD    Examination-Activity Limitations  Bathing;Locomotion Level;Reach Overhead    Examination-Participation Restrictions  Driving;Yard Work    [employer] officer  Evolving/Moderate complexity    Rehab Potential  Good    PT Frequency  2x / week    PT Duration  8 weeks    PT Treatment/Interventions  ADLs/Self Care Home Management;Canalith Repostioning;Cryotherapy;Moist Heat;Gait training;Functional mobility training;Therapeutic activities;Therapeutic exercise;Balance training;Neuromuscular re-education;Patient/family education;Manual techniques;Passive range of motion;Taping;Vestibular;Spinal Manipulations    PT Next Visit Plan  (No TDN due to medical history of HIV)  Continue cervical and thoracic spine manual therapy for increased rotation; R shoulder ROM and strengthening; work on balance reactions/rockerboard    PT Home Exercise Plan  RUE and RLE strengthening, cervical ROM, balance, vestibular    Consulted and Agree with Plan of Care  Patient       Patient will benefit from skilled therapeutic intervention in order to improve the following deficits and impairments:  Decreased balance, Decreased coordination, Decreased  mobility, Decreased range of motion, Decreased strength, Difficulty walking, Dizziness, Impaired flexibility, Impaired sensation, Impaired UE functional use, Pain  Visit Diagnosis: Dizziness and giddiness  Unsteadiness on feet  Muscle weakness (generalized)  Cervicalgia  Abnormal posture  Difficulty in walking, not elsewhere classified     Problem List Patient Active Problem List   Diagnosis Date Noted  . Peripheral vertigo 11/29/2018  . Encounter for health maintenance examination in adult 12/20/2017  . Chronic kidney disease 12/20/2017  . Diastolic CHF with preserved left ventricular function, NYHA class 2 ([place]) 01/06/2015  . Muscle spasm of back 10/15/2014  . Exertional dyspnea 09/30/2014  . Insomnia 07/22/2014  . BPH with obstruction/lower urinary tract symptoms 06/17/2014  . Screening for colon cancer 10/07/2013  . Left ankle pain 08/07/2013  . Dyslipidemia 07/22/2012  . Erectile dysfunction 07/22/2012  . GERD 05/17/2010  . Dysphagia 03/08/2009  . CERVICAL LYMPHADENOPATHY 09/19/2007  . CHOLECYSTECTOMY, HX OF 05/28/2006  . Human immunodeficiency virus (HIV) disease ([place]) 02/27/2006  . Essential hypertension 02/27/2006  . PNEUMONIA, HX OF 02/27/2006  . ARTHROSCOPY, KNEE, HX OF 02/27/2006    Rico Junker, PT, DPT 01/15/19  12:15 PM    [facility] [address] [place], [place], [address] Phone: [phone]   Fax:  [phone]  Name: JENTZEN MINASYAN MRN: [id number] Date of Birth: [birth year]

## 2019-01-15 NOTE — Patient Instructions (Addendum)
Side Pull: Double Arm    On back, knees bent, feet flat. Arms perpendicular to body, shoulder level, elbows straight but relaxed. Pull arms out to sides, elbows straight. Resistance band comes across collarbones, hands toward floor. Hold momentarily. Slowly return to starting position. Repeat _10__ times. Band color __Green___     Sash    On back, knees bent, feet flat, left hand on left hip, right hand above left. Pull right arm DIAGONALLY up towards right side of head. Hold momentarily. Slowly return to starting position. Repeat 5 times each side.  Switch and Do with left arm 10 times. Band color ___Green___     Shoulder Rotation: Single Arm    On back, knees bent, feet flat, elbows tucked at sides, bent 90, hands palms up. Keep left arm still. Pull right hand out to side and down toward floor, keeping elbow near side. Hold momentarily. Slowly return to starting position. Repeat __10_ times total (5 per side, alternating) Band color __Green____     AND   Access Code: [email or web address]  URL: [email or web address]  Date: 01/15/2019  Prepared by: Misty Stanley   Exercises Supine Thoracic Mobilization Towel Roll Vertical - 5 minute hold - 1x daily - 7x weekly Upper Trapezius Stretch - 3 sets - 30 second hold - 1x daily - 7x weekly Supine Scapular Retraction - 10 reps - 1 sets - 5 seconds hold - 1x daily - 7x weekly Supine Cervical Retraction with Towel - 10 reps - 1 sets - 3 second hold - 1x daily - 7x weekly Lying down: isometric cervical rotation - 10 reps - 1 sets - 5 second hold - 1x daily - 7x weekly Doorway Pec Stretch at 60 Elevation - 3 sets - 30 seconds hold - 1x daily - 7x weekly Supine Suboccipital Release with Tennis Balls - 10 reps - 2 sets - 1x daily - 7x weekly Seated Assisted Cervical Rotation with Towel - 3 sets - 15 seconds hold - 1x daily - 7x weekly Standing with Eyes Closed - 10 reps - 2 sets - 1x daily - 7x weekly Staggered stance with Eyes Open -  10 reps - 3 sets - 1x daily - 7x weekly Walking Tandem Stance - 4 sets - 1x daily - 7x weekly

## 2019-01-17 ENCOUNTER — Encounter: Payer: Self-pay | Admitting: Physical Therapy

## 2019-01-17 ENCOUNTER — Ambulatory Visit: Payer: Medicare HMO | Admitting: Physical Therapy

## 2019-01-17 ENCOUNTER — Other Ambulatory Visit: Payer: Self-pay

## 2019-01-17 DIAGNOSIS — R2681 Unsteadiness on feet: Secondary | ICD-10-CM

## 2019-01-17 DIAGNOSIS — R262 Difficulty in walking, not elsewhere classified: Secondary | ICD-10-CM | POA: Diagnosis not present

## 2019-01-17 DIAGNOSIS — R42 Dizziness and giddiness: Secondary | ICD-10-CM | POA: Diagnosis not present

## 2019-01-17 DIAGNOSIS — M6281 Muscle weakness (generalized): Secondary | ICD-10-CM | POA: Diagnosis not present

## 2019-01-17 DIAGNOSIS — R293 Abnormal posture: Secondary | ICD-10-CM | POA: Diagnosis not present

## 2019-01-17 DIAGNOSIS — M542 Cervicalgia: Secondary | ICD-10-CM

## 2019-01-17 NOTE — Patient Instructions (Signed)
Side Pull: Double Arm    On back, knees bent, feet flat. Arms perpendicular to body, shoulder level, elbows straight but relaxed. Pull arms out to sides, elbows straight. Resistance band comes across collarbones, hands toward floor. Hold momentarily. Slowly return to starting position. Repeat _10__ times. Band color __Green___     Sash    On back, knees bent, feet flat, left hand on left hip, right hand above left. Pull right arm DIAGONALLY up towards right side of head. Hold momentarily. Slowly return to starting position. Repeat 5 times each side.  Switch and Do with left arm 10 times. Band color ___Green___     Shoulder Rotation: Single Arm    On back, knees bent, feet flat, elbows tucked at sides, bent 90, hands palms up. Keep left arm still. Pull right hand out to side and down toward floor, keeping elbow near side. Hold momentarily. Slowly return to starting position. Repeat __10_ times total (5 per side, alternating) Band color __Green____     AND   Access Code: [email or web address]  URL: [email or web address]  Date: 01/17/2019  Prepared by: Misty Stanley   Exercises Supine Thoracic Mobilization Towel Roll Vertical - 5 minute hold - 1x daily - 7x weekly Upper Trapezius Stretch - 3 sets - 30 second hold - 1x daily - 7x weekly Supine Scapular Retraction - 10 reps - 1 sets - 5 seconds hold - 1x daily - 7x weekly Supine Cervical Retraction with Towel - 10 reps - 1 sets - 3 second hold - 1x daily - 7x weekly Lying down: isometric cervical rotation - 10 reps - 1 sets - 5 second hold - 1x daily - 7x weekly Doorway Pec Stretch at 60 Elevation - 3 sets - 30 seconds hold - 1x daily - 7x weekly Supine Suboccipital Release with Tennis Balls - 10 reps - 2 sets - 1x daily - 7x weekly Seated Assisted Cervical Rotation with Towel - 3 sets - 15 seconds hold - 1x daily - 7x weekly Standing with Eyes Closed - 10 reps - 2 sets - 1x daily - 7x weekly Staggered stance with Eyes Open -  10 reps - 3 sets - 1x daily - 7x weekly Walking Tandem Stance - 4 sets - 1x daily - 7x weekly Gastroc Stretch on Step - 3 sets - 20 second hold - 1x daily - 7x weekly

## 2019-01-17 NOTE — Therapy (Signed)
[facility] [address] [place], [place], [address] Phone: [phone]   Fax:  [phone]  Physical Therapy Treatment  Patient Details  Name: Jeffrey [name] MRN: [id number] Date of Birth: 1945/06/09 Referring Provider (PT): Guadalupe Dawn, MD; Blane Ohara McDiarmid, MD   Encounter Date: 01/17/2019  PT End of Session - 01/17/19 1557    Visit Number  8    Number of Visits  17    Date for PT Re-Evaluation  02/10/19    Authorization Type  [name] Medicare and Medicaid; $40 copay after using all Medicaid visits.  VL: Prior Authorization.  10th visit PN    PT Start Time  0940    PT Stop Time  1018    PT Time Calculation (min)  38 min    Activity Tolerance  Patient tolerated treatment well    Behavior During Therapy  WFL for tasks assessed/performed       Past Medical History:  Diagnosis Date  . Colon polyps   . GERD (gastroesophageal reflux disease)   . HIV (human immunodeficiency virus infection) ([place]) 1999    Past Surgical History:  Procedure Laterality Date  . CHOLECYSTECTOMY  1982  . ESOPHAGEAL MANOMETRY N/A 09/03/2017   Procedure: ESOPHAGEAL MANOMETRY (EM);  Surgeon: Mauri Pole, MD;  Location: [facility];  Service: Endoscopy;  Laterality: N/A;  . KNEE SURGERY     LEFT KNEE  . TONSILLECTOMY      There were no vitals filed for this visit.  Subjective Assessment - 01/17/19 0941    Subjective  Still waking up with soreness but is sleeping better.  Is having some edema by the end of the day in the L lower leg and heel pain when he first gets up at in the morning, improves with time and movement.    Pertinent History  HIV, diastolic CHF, back pain, HTN, and CKD    Limitations  Walking;Standing    Diagnostic tests  None recently    Patient Stated Goals  Find out why I am staggering, is it permanent?    Currently in Pain?  Yes                       [name] Adult PT Treatment/Exercise -  01/17/19 0948      Therapeutic Activites    Therapeutic Activities  Other Therapeutic Activities    Other Therapeutic Activities  reviewed cervical rotation with towel; pt reporting pain in R shoulder when pulling across - pointed to front of shoulder.  Reviewed proper way to hold and sequence rotation and that RUE should be pulling down and LUE across.  Pt then changed to say he was pulling down with RUE and that the pain was in the back/top of shoulder. Discussed that he is likely feeling a stretch from the rotation but did review how to perform stretch.  Also discussed patient's c/o LLE edema and heel pain.  Recommended pt discuss edema with physician next week.  Educated pt on plantar fasciitis and educated pt on heel stretch to perform before bed and in the am; also discussed wearing shoes when he first gets out of bed to gradually stretch and WB through plantar surface of foot.      Exercises   Exercises  Ankle      Ankle Exercises: Stretches   Plantar Fascia Stretch  3 reps;20 seconds    Gastroc Stretch  3 reps;20 seconds  Balance Exercises - 01/17/19 1019      Balance Exercises: Standing   SLS  Eyes open;Foam/compliant surface;Intermittent upper extremity support;2 reps    SLS with Vectors  Foam/compliant surface;Intermittent upper extremity assist;5 reps   ant/post rockerboard tapping cone in front   Rockerboard  Anterior/posterior;Head turns;EO;10 reps;Intermittent UE support   weight shifting and then with head turns/nods       PT Education - 01/17/19 1557    Education Details  see TA    Person(s) Educated  Patient    Methods  Explanation    Comprehension  Verbalized understanding;Returned demonstration       PT Short Term Goals - 01/08/19 0817      PT SHORT TERM GOAL #1   Title  Pt will participate in further assessment of gait and balance with FGA and SOT    Time  4    Period  Weeks    Status  Achieved    Target Date  01/11/19      PT SHORT TERM  GOAL #2   Title  Pt will report 25% greater use of RUE to wash his back and </= 3/10 pain in R shoulder with IR/ER    Baseline  5/10 R shoulder pain with IR to reach behind back - able to reach to inferior tip of scapula    Time  4    Period  Weeks    Status  Partially Met    Target Date  01/11/19      PT SHORT TERM GOAL #3   Title  Pt will improve cervical ROM by 5-8 degrees in all movements    Baseline  see flow sheet    Time  4    Period  Weeks    Status  Achieved    Target Date  01/11/19      PT SHORT TERM GOAL #4   Title  Pt will participate in more thorough vestibular evaluation due to improvement in overall cervical ROM    Time  4    Period  Weeks    Status  Achieved    Target Date  01/11/19        PT Long Term Goals - 12/26/18 2103      PT LONG TERM GOAL #1   Title  Pt will demonstrate independence with final HEP - cervical ROM, UE/LE strength, balance, vestibular  (ALL LTG DUE BY 02/10/2019)    Time  8    Period  Weeks    Status  New      PT LONG TERM GOAL #2   Title  Pt will demonstrate 10-12 degree improvement in cervical spine ROM    Baseline  see flowsheets    Time  8    Period  Weeks    Status  New      PT LONG TERM GOAL #3   Title  Pt will demonstrate improvement in RUE and RLE strength by one mm grade (MMT)    Baseline  see flowsheet    Time  8    Period  Weeks    Status  New      PT LONG TERM GOAL #4   Title  Pt will demonstrate decreased falls risk as indicated by 4 point improvement in FGA    Baseline  15/30    Time  8    Period  Weeks    Status  Revised      PT LONG TERM GOAL #5   Title  Pt will demonstrate SOT composite score to WNL for age group    Baseline  deferred    Time  8    Period  Weeks    Status  Deferred      PT LONG TERM GOAL #6   Title  Vestibular goal if needed            Plan - 01/17/19 1558    Clinical Impression Statement  Continued to address patient's concerns about various symptoms he experiences in LE  and in shoulder/neck when performing exercises.  Encouraged pt to discuss LE edema when he has his appointment with PCP next week and is discussing cardiac symptoms.  Continued to address impairments in static and dynamic balance focusing on use of rockerboard for ankle and hip strategy training and SLS balance training.  Pt tends to keep weight posterior on heels and has greater difficulty with weight shifting forwards.  Will continue to address in order to progress towards LTG.    Personal Factors and Comorbidities  Comorbidity 3+;Finances;Time since onset of injury/illness/exacerbation    Comorbidities  HIV, diastolic CHF, back pain, HTN, and CKD    Examination-Activity Limitations  Bathing;Locomotion Level;Reach Overhead    Examination-Participation Restrictions  Driving;Yard Work    [employer]chant [employer] officer  Evolving/Moderate complexity    Rehab Potential  Good    PT Frequency  2x / week    PT Duration  8 weeks    PT Treatment/Interventions  ADLs/Self Care Home Management;Canalith Repostioning;Cryotherapy;Moist Heat;Gait training;Functional mobility training;Therapeutic activities;Therapeutic exercise;Balance training;Neuromuscular re-education;Patient/family education;Manual techniques;Passive range of motion;Taping;Vestibular;Spinal Manipulations    PT Next Visit Plan  (No TDN due to medical history of HIV)  Continue cervical and thoracic spine manual therapy for increased rotation; R shoulder ROM and strengthening; work on balance reactions/rockerboard    PT Home Exercise Plan  RUE and RLE strengthening, cervical ROM, balance, vestibular    Consulted and Agree with Plan of Care  Patient       Patient will benefit from skilled therapeutic intervention in order to improve the following deficits and impairments:  Decreased balance, Decreased coordination, Decreased mobility, Decreased range of motion, Decreased strength, Difficulty walking, Dizziness, Impaired flexibility, Impaired  sensation, Impaired UE functional use, Pain  Visit Diagnosis: Dizziness and giddiness  Unsteadiness on feet  Muscle weakness (generalized)  Cervicalgia  Difficulty in walking, not elsewhere classified  Abnormal posture     Problem List Patient Active Problem List   Diagnosis Date Noted  . Peripheral vertigo 11/29/2018  . Encounter for health maintenance examination in adult 12/20/2017  . Chronic kidney disease 12/20/2017  . Diastolic CHF with preserved left ventricular function, NYHA class 2 ([place]) 01/06/2015  . Muscle spasm of back 10/15/2014  . Exertional dyspnea 09/30/2014  . Insomnia 07/22/2014  . BPH with obstruction/lower urinary tract symptoms 06/17/2014  . Screening for colon cancer 10/07/2013  . Left ankle pain 08/07/2013  . Dyslipidemia 07/22/2012  . Erectile dysfunction 07/22/2012  . GERD 05/17/2010  . Dysphagia 03/08/2009  . CERVICAL LYMPHADENOPATHY 09/19/2007  . CHOLECYSTECTOMY, HX OF 05/28/2006  . Human immunodeficiency virus (HIV) disease ([place]) 02/27/2006  . Essential hypertension 02/27/2006  . PNEUMONIA, HX OF 02/27/2006  . ARTHROSCOPY, KNEE, HX OF 02/27/2006    Rico Junker, PT, DPT 01/17/19    4:03 PM    [facility] [address] [place], [place], [address] Phone: [phone]   Fax:  [phone]  Name: YUTA CIPOLLONE MRN: [id number] Date of Birth: 1945-12-13

## 2019-01-20 ENCOUNTER — Ambulatory Visit: Payer: Medicare HMO | Admitting: Rehabilitative and Restorative Service Providers"

## 2019-01-20 ENCOUNTER — Encounter: Payer: Self-pay | Admitting: Rehabilitative and Restorative Service Providers"

## 2019-01-20 ENCOUNTER — Other Ambulatory Visit: Payer: Self-pay

## 2019-01-20 DIAGNOSIS — M6281 Muscle weakness (generalized): Secondary | ICD-10-CM

## 2019-01-20 DIAGNOSIS — R2681 Unsteadiness on feet: Secondary | ICD-10-CM | POA: Diagnosis not present

## 2019-01-20 DIAGNOSIS — R293 Abnormal posture: Secondary | ICD-10-CM

## 2019-01-20 DIAGNOSIS — M542 Cervicalgia: Secondary | ICD-10-CM

## 2019-01-20 DIAGNOSIS — R262 Difficulty in walking, not elsewhere classified: Secondary | ICD-10-CM | POA: Diagnosis not present

## 2019-01-20 DIAGNOSIS — R42 Dizziness and giddiness: Secondary | ICD-10-CM

## 2019-01-20 NOTE — Therapy (Signed)
[facility] [address] [place], [place], [address] Phone: [phone]   Fax:  [phone]  Physical Therapy Treatment  Patient Details  Name: Jeffrey [name] MRN: [id number] Date of Birth: 1945/10/12 Referring Provider (PT): Guadalupe Dawn, MD; Blane Ohara McDiarmid, MD   Encounter Date: 01/20/2019  PT End of Session - 01/20/19 1213    Visit Number  9    Number of Visits  17    Date for PT Re-Evaluation  02/10/19    Authorization Type  [name] Medicare and Medicaid; $40 copay after using all Medicaid visits.  VL: Prior Authorization.  10th visit PN    PT Start Time  1020    PT Stop Time  1103    PT Time Calculation (min)  43 min    Activity Tolerance  Patient tolerated treatment well    Behavior During Therapy  WFL for tasks assessed/performed       Past Medical History:  Diagnosis Date  . Colon polyps   . GERD (gastroesophageal reflux disease)   . HIV (human immunodeficiency virus infection) ([employer]) 1999    Past Surgical History:  Procedure Laterality Date  . CHOLECYSTECTOMY  1982  . ESOPHAGEAL MANOMETRY N/A 09/03/2017   Procedure: ESOPHAGEAL MANOMETRY (EM);  Surgeon: Mauri Pole, MD;  Location: [facility];  Service: Endoscopy;  Laterality: N/A;  . KNEE SURGERY     LEFT KNEE  . TONSILLECTOMY      There were no vitals filed for this visit.  Subjective Assessment - 01/20/19 1021    Subjective  The patient reports he wakes with neck stiffness every day.  His pain is worse on the right side and worse when looking to the left.  A hot shower helps reduce pain.  The staggering with gait is improving.    Pertinent History  HIV, diastolic CHF, back pain, HTN, and CKD    Patient Stated Goals  Find out why I am staggering, is it permanent?    Currently in Pain?  Yes    Pain Score  3     Pain Location  Neck    Pain Orientation  Right    Pain Descriptors / Indicators  Aching;Sore    Pain Type  Acute pain    Pain Onset  More than a month ago    Pain Frequency  Intermittent    Aggravating Factors   worse in the morning    Pain Relieving Factors  improved with a hot shower         [facility] PT Assessment - 01/20/19 1055      ROM / Strength   AROM / PROM / Strength  AROM      AROM   Overall AROM   Deficits    Overall AROM Comments  measured AROM at end of session after  stretching    Cervical - Right Rotation  60    Cervical - Left Rotation  58                   [facility] Adult PT Treatment/Exercise - 01/20/19 1028      Ambulation/Gait   Ambulation/Gait  Yes    Ambulation/Gait Assistance  6: Modified independent (Device/Increase time)    Ambulation Distance (Feet)  150 Feet    Assistive device  None    Gait Pattern  Step-through pattern;Wide base of support    Ambulation Surface  Level;Unlevel;Indoor;Outdoor;Paved;Grass   rubber mulch   Gait Comments  OUtdoor ambulation in  grass and on rubber mulch, performed marching in place in mulch and added head motion with CGA due to unsteadiness.        Exercises   Exercises  Neck;Shoulder      Neck Exercises: Supine   Cervical Isometrics  Right lateral flexion;Left lateral flexion;Right rotation;Left rotation   2 reps, 3 second holds prior to passive stretching    Neck Retraction  10 reps;3 secs    Other Supine Exercise  Supine towel roll stretch for thoracic self mobilization.      Neck Exercises: Sidelying   Other Sidelying Exercise  "open the book" exercise in sidelying x 5 reps right and left for anterior chest stretch and scapular mobility      Neck Exercises: Prone   Neck Retraction  10 reps    Upper Extremity Flexion with Stabilization  10 reps   alternating UEs; notes mild R hip discomfort     Shoulder Exercises: Standing   External Rotation  Strengthening;Right;Left;10 reps;Weights    External Rotation Weight (lbs)  3 lbs with arms at neutral and elbows bent to 90 degrees flexion    Flexion   Strengthening;Right;Left;10 reps;Weights    Shoulder Flexion Weight (lbs)  3 lbs alternating with visual cues at mirror      Manual Therapy   Manual Therapy  Joint mobilization;Soft tissue mobilization;Manual Traction;Muscle Energy Technique;Passive ROM    Manual therapy comments  to improve AROM, neck awareness, muscle energy and reduce pain; no pain at end of session    Joint Mobilization  Grade II-III thoracic mobilization P>A in prone; lateral glide mid cervical spine R>L grade II and L>R grade II    Soft tissue mobilization  R upper trap, parascapular musculature and upper trapezius    Passive ROM  overpressure for levator stretch and passive overpressure after isometrics    Manual Traction  supine manual traction     Muscle Energy Technique  isometrics               PT Short Term Goals - 01/08/19 0817      PT SHORT TERM GOAL #1   Title  Pt will participate in further assessment of gait and balance with FGA and SOT    Time  4    Period  Weeks    Status  Achieved    Target Date  01/11/19      PT SHORT TERM GOAL #2   Title  Pt will report 25% greater use of RUE to wash his back and </= 3/10 pain in R shoulder with IR/ER    Baseline  5/10 R shoulder pain with IR to reach behind back - able to reach to inferior tip of scapula    Time  4    Period  Weeks    Status  Partially Met    Target Date  01/11/19      PT SHORT TERM GOAL #3   Title  Pt will improve cervical ROM by 5-8 degrees in all movements    Baseline  see flow sheet    Time  4    Period  Weeks    Status  Achieved    Target Date  01/11/19      PT SHORT TERM GOAL #4   Title  Pt will participate in more thorough vestibular evaluation due to improvement in overall cervical ROM    Time  4    Period  Weeks    Status  Achieved    Target Date  01/11/19        PT Long Term Goals - 12/26/18 2103      PT LONG TERM GOAL #1   Title  Pt will demonstrate independence with final HEP - cervical ROM, UE/LE  strength, balance, vestibular  (ALL LTG DUE BY 02/10/2019)    Time  8    Period  Weeks    Status  New      PT LONG TERM GOAL #2   Title  Pt will demonstrate 10-12 degree improvement in cervical spine ROM    Baseline  see flowsheets    Time  8    Period  Weeks    Status  New      PT LONG TERM GOAL #3   Title  Pt will demonstrate improvement in RUE and RLE strength by one mm grade (MMT)    Baseline  see flowsheet    Time  8    Period  Weeks    Status  New      PT LONG TERM GOAL #4   Title  Pt will demonstrate decreased falls risk as indicated by 4 point improvement in FGA    Baseline  15/30    Time  8    Period  Weeks    Status  Revised      PT LONG TERM GOAL #5   Title  Pt will demonstrate SOT composite score to WNL for age group    Baseline  deferred    Time  8    Period  Weeks    Status  Deferred      PT LONG TERM GOAL #6   Title  Vestibular goal if needed            Plan - 01/20/19 1219    Clinical Impression Statement  The patient responded well today to UE/neck strengthening and manual techniques.  We worked on functional gait on outdoor surfaces as patient noted that is when he notices he is challenged.  PT to continue working to [employer].    PT Treatment/Interventions  ADLs/Self Care Home Management;Canalith Repostioning;Cryotherapy;Moist Heat;Gait training;Functional mobility training;Therapeutic activities;Therapeutic exercise;Balance training;Neuromuscular re-education;Patient/family education;Manual techniques;Passive range of motion;Taping;Vestibular;Spinal Manipulations    PT Next Visit Plan  check HEP and consolidate to less # of exercises; Continue cervical and thoracic spine manual therapy for increased rotation; R shoulder ROM and strengthening; work on balance reactions/rockerboard  (No TDN due to medical history of HIV)    Consulted and Agree with Plan of Care  Patient       Patient will benefit from skilled therapeutic intervention in order to improve  the following deficits and impairments:  Decreased balance, Decreased coordination, Decreased mobility, Decreased range of motion, Decreased strength, Difficulty walking, Dizziness, Impaired flexibility, Impaired sensation, Impaired UE functional use, Pain  Visit Diagnosis: Dizziness and giddiness  Unsteadiness on feet  Muscle weakness (generalized)  Cervicalgia  Difficulty in walking, not elsewhere classified  Abnormal posture     Problem List Patient Active Problem List   Diagnosis Date Noted  . Peripheral vertigo 11/29/2018  . Encounter for health maintenance examination in adult 12/20/2017  . Chronic kidney disease 12/20/2017  . Diastolic CHF with preserved left ventricular function, NYHA class 2 ([place]) 01/06/2015  . Muscle spasm of back 10/15/2014  . Exertional dyspnea 09/30/2014  . Insomnia 07/22/2014  . BPH with obstruction/lower urinary tract symptoms 06/17/2014  . Screening for colon cancer 10/07/2013  . Left ankle pain 08/07/2013  . Dyslipidemia 07/22/2012  . Erectile  dysfunction 07/22/2012  . GERD 05/17/2010  . Dysphagia 03/08/2009  . CERVICAL LYMPHADENOPATHY 09/19/2007  . CHOLECYSTECTOMY, HX OF 05/28/2006  . Human immunodeficiency virus (HIV) disease ([place]) 02/27/2006  . Essential hypertension 02/27/2006  . PNEUMONIA, HX OF 02/27/2006  . ARTHROSCOPY, KNEE, HX OF 02/27/2006    Zalika Tieszen, PT 01/20/2019, 12:20 PM  [facility] [address] [place], [place], [address] Phone: [phone]   Fax:  [phone]  Name: HILARY PUNDT MRN: [id number] Date of Birth: 1946-02-20

## 2019-01-21 ENCOUNTER — Ambulatory Visit (HOSPITAL_COMMUNITY)
Admission: RE | Admit: 2019-01-21 | Discharge: 2019-01-21 | Disposition: A | Discharge status: Home / Self Care | Payer: Medicare HMO | Source: Ambulatory Visit | Attending: Family Medicine | Admitting: Family Medicine

## 2019-01-21 ENCOUNTER — Ambulatory Visit (INDEPENDENT_AMBULATORY_CARE_PROVIDER_SITE_OTHER): Payer: Medicare HMO | Admitting: Family Medicine

## 2019-01-21 ENCOUNTER — Encounter: Payer: Self-pay | Admitting: Family Medicine

## 2019-01-21 VITALS — BP 126/62 | HR 70 | Ht 70.0 in | Wt 171.4 lb

## 2019-01-21 DIAGNOSIS — R079 Chest pain, unspecified: Secondary | ICD-10-CM

## 2019-01-21 DIAGNOSIS — Z0000 Encounter for general adult medical examination without abnormal findings: Secondary | ICD-10-CM

## 2019-01-21 DIAGNOSIS — Z23 Encounter for immunization: Secondary | ICD-10-CM

## 2019-01-21 DIAGNOSIS — I5030 Unspecified diastolic (congestive) heart failure: Secondary | ICD-10-CM | POA: Insufficient documentation

## 2019-01-21 MED ORDER — NITROGLYCERIN 0.4 MG SL SUBL: 0.4000 mg | SUBLINGUAL_TABLET | SUBLINGUAL | 3 refills | Status: DC | PRN

## 2019-01-21 NOTE — Progress Notes (Signed)
EKG reviewed prior to patient leaving clinic. Normal EKG.

## 2019-01-21 NOTE — Progress Notes (Signed)
  Patient Name: Jeffrey [name] Date of Birth: 1945-10-22 Date of Visit: 01/21/19 PCP: Danna Hefty, DO  Chief Complaint: chest pain and SOB with exertion   Subjective: Jeffrey [name] is a 73 y.o. with medical history significant for NYHA class II diastolic heart failure with preserved ejection fraction, hypertension, HIV and GERD presenting today for shortness of breath and chest pain during exertion.   Jeffrey [name] states he has been experiencing shortness of breath and chest pain during exertion for over a year.  Patient reports that during a physical therapy session, he was walking on the treadmill and began to experience shortness of breath and left-sided sharp chest pains.  Patient states that his physical therapist expressed concern for potential heart murmur in which his heart was skipping beats consistently. Patient denies currently having SOB and chest pain nor pain radiating to his jaw or into left arm nor heart burn. Symptoms subside with rest. Patient reports seeing a cardiologist in 2016 and they did not find any additional cardiac findings for his pain other than his diastolic heart failure.  Echocardiogram was completed and showed grade 1 diastolic dysfunction with normal cavity size, ejection fraction 50-55% and normal wall motion.  Patient reports that he can walk to the end of the street without experiencing shortness of breath.  Patient states that he does not experience leg pain with exertion but has noticed that his left leg tends to swell more than his right leg.  Patient reports occasional tingling in his left leg in the does not go past the center of his foot.  Patient notes that his family history is notable for venous insufficiency, stating his brother and mother had to have surgeries due to this condition.  Patient also expresses concern for development of COPD, states that he has never smoked cigarettes.  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, he denies being exposed to hazardous chemicals.   I have reviewed the patient's medical, surgical, family, and social history as appropriate.  Vitals:   01/21/19 0958  BP: 126/62  Pulse: 70  SpO2: 98%    Physical Exam:   General: Alert and cooperative and appears to be in no acute distress Cardio: Normal S1 and S2, no S3 or S4. Rhythm is regular. No murmurs or rubs appreciated. Pulm: Clear to auscultation bilaterally, no crackles, wheezing, or diminished breath sounds. Normal respiratory effort.  Patient appears to move air well. Abdomen: Bowel sounds normal. Abdomen soft and non-tender.  Abdomen is mildly distended. Extremities: Trace peripheral edema in bilateral lower extremities. Warm/ well perfused. Neuro: Patient is alert and oriented x3  Assessment & Plan:   Encounter for health maintenance examination in adult Patient reports for annual flu vaccine -Flu vaccine given  Diastolic CHF with preserved left ventricular function, NYHA class 2 ([place]) Patient reports that exertion no angina and shortness of breath during exercise and walking.  Last echocardiogram in 2016 with ejection fraction 50-55% and normal wall motion and normal cavity size. Patient denies current chest pain or SOB while sitting but does experience these symptoms while walking neighborhood and with initially getting out of chair.  -EKG completed in clinic, normal  -cardiology referral   Return to care as needed.   Stark Klein, MD  Family Medicine  PGY1

## 2019-01-21 NOTE — Assessment & Plan Note (Addendum)
Patient reports that exertion no angina and shortness of breath during exercise and walking.  Last echocardiogram in 2016 with ejection fraction 50-55% and normal wall motion and normal cavity size. Patient denies current chest pain or SOB while sitting but does experience these symptoms while walking neighborhood and with initially getting out of chair.  -EKG completed in clinic, normal  -cardiology referral

## 2019-01-21 NOTE — Assessment & Plan Note (Signed)
Patient reports for annual flu vaccine -Flu vaccine given

## 2019-01-21 NOTE — Patient Instructions (Addendum)
It was a pleasure to see you today! Thank you for choosing [facility] for your primary care. Jeffrey [name] was seen for leg swelling and chest pain during exertion.   Our plans for today were:   Chest Pain with shortness of breath - I have sent in a referral for cardiology to further evaluate this concern for you. Their office should contact you soon to schedule an appointment.  - Please go to the Emergency Department if you have chest pain and shortness of breath that does not subside with rest.  -Please avoid exercising until you are evaluated by the cardiologist.  -I have prescribed nitroglycerin for you to take when you are experiencing chest pain. If you need to take more than 2 doses of this at a time, please go to the ED for further evaluation.   Influenza Vaccine      You are up to date for your health maintenance screenings!    You should return to our clinic to see  Best,  Dr. Rosita Fire  Angina  Angina is very bad discomfort or pain in the chest, neck, arm, jaw, or back. The discomfort is caused by a lack of blood in the middle layer of the heart wall (myocardium). What are the causes? This condition is caused by a buildup of fat and cholesterol (plaque) in your arteries (atherosclerosis). This buildup narrows the arteries and makes it hard for blood to flow. What increases the risk? You are more likely to develop this condition if:  You have high levels of cholesterol in your blood.  You have high blood pressure (hypertension).  You have diabetes.  You have a family history of heart disease.  You are not active, or you do not exercise enough.  You feel sad (depressed).  You have been treated with high energy rays (radiation) on the left side of your chest. Other risk factors are:  Using tobacco.  Being very overweight (obese).  Eating a diet high in unhealthy fats (saturated fats).  Having stress, or being exposed to things that cause  stress.  Using drugs, such as cocaine. Women have a greater risk for angina if:  They are older than 16.  They have stopped having their period (are in postmenopause). What are the signs or symptoms? Common symptoms of this condition in both men and women may include:  Chest pain, which may: ? Feel like a crushing or squeezing in the chest. ? Feel like a tightness, pressure, fullness, or heaviness in the chest. ? Last for more than a few minutes at a time. ? Stop and come back (recur) after a few minutes.  Pain in the neck, arm, jaw, or back.  Heartburn or upset stomach (indigestion) for no reason.  Being short of breath.  Feeling sick to your stomach (nauseous).  Sudden cold sweats. Women and people with diabetes may have other symptoms that are not usual, such as feeling:  Tired (fatigue).  Worried or nervous (anxious) for no reason.  Weak for no reason.  Dizzy or passing out (fainting). How is this treated? This condition may be treated with:  Medicines. These are given to: ? Prevent blood clots. ? Prevent heart attack. ? Relax blood vessels and improve blood flow to the heart (nitrates). ? Reduce blood pressure. ? Improve the pumping action of the heart. ? Reduce fat and cholesterol in the blood.  A procedure to widen a narrowed or blocked artery in the heart (angioplasty).  Surgery to allow  blood to go around a blocked artery (coronary artery bypass surgery). Follow these instructions at home: Medicines  Take over-the-counter and prescription medicines only as told by your doctor.  Do not take these medicines unless your doctor says that you can: ? NSAIDs. These include:  Ibuprofen.  Naproxen. ? Vitamin supplements that have vitamin A, vitamin E, or both. ? Hormone therapy that contains estrogen with or without progestin. Eating and drinking   Eat a heart-healthy diet that includes: ? Lots of fresh fruits and vegetables. ? Whole  grains. ? Low-fat (lean) protein. ? Low-fat dairy products.  Follow instructions from your doctor about what you cannot eat or drink. Activity  Follow an exercise program that your doctor tells you.  Talk with your doctor about joining a program to help improve the health of your heart (cardiac rehab).  When you feel tired, take a break. Plan breaks if you know you are going to feel tired. Lifestyle   Do not use any products that contain nicotine or tobacco. This includes cigarettes, e-cigarettes, and chewing tobacco. If you need help quitting, ask your doctor.  If your doctor says you can drink alcohol: ? Limit how much you use to:  0-1 drink a day for women who are not pregnant.  0-2 drinks a day for men. ? Be aware of how much alcohol is in your drink. In the [place] one drink equals:  One 12 oz bottle of beer (355 mL).  One 5 oz glass of wine (148 mL).  One 1 oz glass of hard liquor (44 mL). General instructions  Stay at a healthy weight. If your doctor tells you to do so, work with him or her to lose weight.  Learn to deal with stress. If you need help, ask your doctor.  Keep your vaccines up to date. Get a flu shot every year.  Talk with your doctor if you feel sad. Take a screening test to see if you are at risk for depression.  Work with your doctor to manage any other health problems that you have. These may include diabetes or high blood pressure.  Keep all follow-up visits as told by your doctor. This is important. Get help right away if:  You have pain in your chest, neck, arm, jaw, or back, and the pain: ? Lasts more than a few minutes. ? Comes back. ? Does not get better after you take medicine under your tongue (sublingual nitroglycerin). ? Keeps getting worse. ? Comes more often.  You have any of these problems for no reason: ? Sweating a lot. ? Heartburn or upset stomach. ? Shortness of breath. ? Trouble breathing. ? Feeling sick to your  stomach. ? Throwing up (vomiting). ? Feeling more tired than normal. ? Feeling nervous or worrying more than normal. ? Weakness.  You are suddenly dizzy or light-headed.  You pass out. These symptoms may be an emergency. Do not wait to see if the symptoms will go away. Get medical help right away. Call your local emergency services (911 in the [place] Do not drive yourself to the hospital. Summary  Angina is very bad discomfort or pain in the chest, neck, arm, neck, or back.  Symptoms include chest pain, heartburn or upset stomach for no reason, and shortness of breath.  Women or people with diabetes may have symptoms that are not usual, such as feeling nervous or worried for no reason, weak for no reason, or tired.  Take all medicines only as told by  your doctor.  You should eat a heart-healthy diet and follow an exercise program. This information is not intended to replace advice given to you by your health care provider. Make sure you discuss any questions you have with your health care provider. Document Released: 10/11/2007 Document Revised: 12/10/2017 Document Reviewed: 12/10/2017 Elsevier Patient Education  2020 [employer].

## 2019-01-22 ENCOUNTER — Ambulatory Visit: Payer: Medicare HMO | Admitting: Rehabilitative and Restorative Service Providers"

## 2019-01-22 ENCOUNTER — Encounter: Payer: Self-pay | Admitting: Rehabilitative and Restorative Service Providers"

## 2019-01-22 ENCOUNTER — Other Ambulatory Visit: Payer: Self-pay

## 2019-01-22 DIAGNOSIS — R42 Dizziness and giddiness: Secondary | ICD-10-CM | POA: Diagnosis not present

## 2019-01-22 DIAGNOSIS — R262 Difficulty in walking, not elsewhere classified: Secondary | ICD-10-CM

## 2019-01-22 DIAGNOSIS — M542 Cervicalgia: Secondary | ICD-10-CM | POA: Diagnosis not present

## 2019-01-22 DIAGNOSIS — R2681 Unsteadiness on feet: Secondary | ICD-10-CM

## 2019-01-22 DIAGNOSIS — R293 Abnormal posture: Secondary | ICD-10-CM

## 2019-01-22 DIAGNOSIS — M6281 Muscle weakness (generalized): Secondary | ICD-10-CM | POA: Diagnosis not present

## 2019-01-22 NOTE — Patient Instructions (Signed)
Access Code: [email or web address]  URL: [email or web address]  Date: 01/22/2019  Prepared by: Rudell Cobb   Program Notes  If these exercises increase chest pain or shortness of breath, hold exercise until evaluated by cardiologist.   Exercises Supine Thoracic Mobilization Towel Roll Vertical with Arm Stretch - 1 reps - 1 sets - 2 minutes hold - 2x daily - 7x weekly Supine Shoulder Horizontal Abduction with Resistance - 10 reps - 3 sets - 1x daily - 7x weekly Supine Shoulder Flexion with Anchored Resistance - 10 reps - 3 sets - 1x daily - 7x weekly Supine Cervical Retraction with Towel - 10 reps - 1 sets - 3 second hold - 1x daily - 7x weekly Seated Assisted Cervical Rotation with Towel - 3 sets - 15 seconds hold - 1x daily - 7x weekly Upper Trapezius Stretch - 3 sets - 30 second hold - 1x daily - 7x weekly Tandem Stance - 3 reps - 1 sets - 30 seconds hold - 1x daily - 7x weekly Standing with Eyes Closed - 3 reps - 1 sets - 30 seconds hold - 1x daily - 7x weekly Romberg Stance with Head Rotation - 10 reps - 1 sets - 1x daily - 7x weekly

## 2019-01-22 NOTE — Therapy (Addendum)
[facility] [address] [place], [place], [address] Phone: [phone]   Fax:  [phone]  Physical Therapy Treatment and progress note  Patient Details  Name: Jeffrey [name] MRN: [id number] Date of Birth: April 19, 1946 Referring Provider (PT): Guadalupe Dawn, MD; Blane Ohara McDiarmid, MD   Encounter Date: 01/22/2019  PT End of Session - 01/22/19 1214    Visit Number  10    Number of Visits  17    Date for PT Re-Evaluation  02/10/19    Authorization Type  [name] Medicare and Medicaid; $40 copay after using all Medicaid visits.  VL: Prior Authorization.  10th visit PN    PT Start Time  0933    PT Stop Time  1015    PT Time Calculation (min)  42 min    Activity Tolerance  Patient tolerated treatment well    Behavior During Therapy  WFL for tasks assessed/performed       Past Medical History:  Diagnosis Date  . Colon polyps   . GERD (gastroesophageal reflux disease)   . HIV (human immunodeficiency virus infection) ([place]) 1999    Past Surgical History:  Procedure Laterality Date  . CHOLECYSTECTOMY  1982  . ESOPHAGEAL MANOMETRY N/A 09/03/2017   Procedure: ESOPHAGEAL MANOMETRY (EM);  Surgeon: Mauri Pole, MD;  Location: [facility];  Service: Endoscopy;  Laterality: N/A;  . KNEE SURGERY     LEFT KNEE  . TONSILLECTOMY      There were no vitals filed for this visit.  Subjective Assessment - 01/22/19 0933    Subjective  The patient reports symptoms are about the same.  He is stiff in the morning and this improves with hot shower.  Dizziness is resolved and staggering sensation with gait is also imrproved.    Pertinent History  HIV, diastolic CHF, back pain, HTN, and CKD    Patient Stated Goals  Find out why I am staggering, is it permanent?    Currently in Pain?  Yes    Pain Score  3     Pain Location  Neck    Pain Orientation  Right    Pain Descriptors / Indicators  Aching    Pain Type  Acute pain    Pain  Onset  More than a month ago    Pain Frequency  Intermittent    Aggravating Factors   worse in the morning    Pain Relieving Factors  improved with a hot shower         [facility] PT Assessment - 01/22/19 0939      Functional Gait  Assessment   Gait assessed   Yes    Gait Level Surface  Walks 20 ft in less than 7 sec but greater than 5.5 sec, uses assistive device, slower speed, mild gait deviations, or deviates 6-10 in outside of the 12 in walkway width.    Change in Gait Speed  Able to change speed, demonstrates mild gait deviations, deviates 6-10 in outside of the 12 in walkway width, or no gait deviations, unable to achieve a major change in velocity, or uses a change in velocity, or uses an assistive device.    Gait with Horizontal Head Turns  Performs head turns smoothly with slight change in gait velocity (eg, minor disruption to smooth gait path), deviates 6-10 in outside 12 in walkway width, or uses an assistive device.    Gait with Vertical Head Turns  Performs head turns with no change in gait.  Deviates no more than 6 in outside 12 in walkway width.    Gait and Pivot Turn  Pivot turns safely within 3 sec and stops quickly with no loss of balance.    Step Over Obstacle  Is able to step over one shoe box (4.5 in total height) without changing gait speed. No evidence of imbalance.    Gait with Narrow Base of Support  Ambulates 7-9 steps.    Gait with Eyes Closed  Walks 20 ft, slow speed, abnormal gait pattern, evidence for imbalance, deviates 10-15 in outside 12 in walkway width. Requires more than 9 sec to ambulate 20 ft.    Ambulating Backwards  Walks 20 ft, uses assistive device, slower speed, mild gait deviations, deviates 6-10 in outside 12 in walkway width.    Steps  Alternating feet, must use rail.    Total Score  21    FGA comment:  21/30                   [facility] Adult PT Treatment/Exercise - 01/22/19 1219      Ambulation/Gait   Ambulation/Gait  Yes     Ambulation/Gait Assistance  7: Independent    Ambulation Distance (Feet)  300 Feet    Assistive device  None    Gait Pattern  Within Functional Limits    Ambulation Surface  Level;Indoor    Gait Comments  Dynamic gait with head motion, patient has some mild veering, but able to self recover.      Neuro Re-ed    Neuro Re-ed Details   Standing in corner working on narrow base of support with eyes closed, tandem stance activities, foam with eyes closed with min A (did not add to HEP), and standing with horizontal head motion.      Exercises   Exercises  Neck;Shoulder;Other Exercises    Other Exercises   *See updated HEP list.  Consolidated from 11 neck/shoulder exercises to 6 and from 4 balance/LE exercises to 3.             Exercises Supine Thoracic Mobilization Towel Roll Vertical with Arm Stretch - 1 reps - 1 sets - 2 minutes hold - 2x daily - 7x weekly Supine Shoulder Horizontal Abduction with Resistance - 10 reps - 3 sets - 1x daily - 7x weekly Supine Shoulder Flexion with Anchored Resistance - 10 reps - 3 sets - 1x daily - 7x weekly Supine Cervical Retraction with Towel - 10 reps - 1 sets - 3 second hold - 1x daily - 7x weekly Seated Assisted Cervical Rotation with Towel - 3 sets - 15 seconds hold - 1x daily - 7x weekly Upper Trapezius Stretch - 3 sets - 30 second hold - 1x daily - 7x weekly Tandem Stance - 3 reps - 1 sets - 30 seconds hold - 1x daily - 7x weekly Standing with Eyes Closed - 3 reps - 1 sets - 30 seconds hold - 1x daily - 7x weekly Romberg Stance with Head Rotation - 10 reps - 1 sets - 1x daily - 7x weekly   PT Education - 01/22/19 1016    Education Details  consolidated some HEP for improved compliance at home    Person(s) Educated  Patient    Methods  Demonstration;Explanation;Handout    Comprehension  Verbalized understanding;Returned demonstration       PT Short Term Goals - 01/08/19 0817      PT SHORT TERM GOAL #1   Title  Pt will participate in  further assessment of gait and balance with FGA and SOT    Time  4    Period  Weeks    Status  Achieved    Target Date  01/11/19      PT SHORT TERM GOAL #2   Title  Pt will report 25% greater use of RUE to wash his back and </= 3/10 pain in R shoulder with IR/ER    Baseline  5/10 R shoulder pain with IR to reach behind back - able to reach to inferior tip of scapula    Time  4    Period  Weeks    Status  Partially Met    Target Date  01/11/19      PT SHORT TERM GOAL #3   Title  Pt will improve cervical ROM by 5-8 degrees in all movements    Baseline  see flow sheet    Time  4    Period  Weeks    Status  Achieved    Target Date  01/11/19      PT SHORT TERM GOAL #4   Title  Pt will participate in more thorough vestibular evaluation due to improvement in overall cervical ROM    Time  4    Period  Weeks    Status  Achieved    Target Date  01/11/19        PT Long Term Goals - 01/22/19 1219      PT LONG TERM GOAL #1   Title  Pt will demonstrate independence with final HEP - cervical ROM, UE/LE strength, balance, vestibular  (ALL LTG DUE BY 02/10/2019)    Time  8    Period  Weeks    Status  New      PT LONG TERM GOAL #2   Title  Pt will demonstrate 10-12 degree improvement in cervical spine ROM    Baseline  see flowsheets    Time  8    Period  Weeks    Status  New      PT LONG TERM GOAL #3   Title  Pt will demonstrate improvement in RUE and RLE strength by one mm grade (MMT)    Baseline  see flowsheet    Time  8    Period  Weeks    Status  New      PT LONG TERM GOAL #4   Title  Pt will demonstrate decreased falls risk as indicated by 4 point improvement in FGA    Baseline  21/30 on 01/22/19 improved from 15/30    Time  8    Period  Weeks    Status  Achieved      PT LONG TERM GOAL #5   Title  Pt will demonstrate SOT composite score to WNL for age group    Baseline  deferred    Time  8    Period  Weeks    Status  Deferred      PT LONG TERM GOAL #6   Title   Vestibular goal if needed            Plan - 01/22/19 0933    Clinical Impression Statement  The patient met LTG for FGA.  He notes dizziness is resolved and unsteadiness is improving.  PT to continue working to [employer] and plans to do early d/c with HEP.    PT Treatment/Interventions  ADLs/Self Care Home Management;Canalith Repostioning;Cryotherapy;Moist Heat;Gait training;Functional mobility training;Therapeutic activities;Therapeutic exercise;Balance training;Neuromuscular re-education;Patient/family education;Manual techniques;Passive range  of motion;Taping;Vestibular;Spinal Manipulations    PT Home Exercise Plan  Begin checking LTGs and d/c planning, RUE strengthening, cervical ROM, balance, vestibular    Consulted and Agree with Plan of Care  Patient       Patient will benefit from skilled therapeutic intervention in order to improve the following deficits and impairments:  Decreased balance, Decreased coordination, Decreased mobility, Decreased range of motion, Decreased strength, Difficulty walking, Dizziness, Impaired flexibility, Impaired sensation, Impaired UE functional use, Pain  Visit Diagnosis: Unsteadiness on feet  Dizziness and giddiness  Muscle weakness (generalized)  Cervicalgia  Difficulty in walking, not elsewhere classified  Abnormal posture    Physical Therapy Progress Note   Dates of Reporting Period:12/12/2018 to 01/22/2019   Objective Measurements: see goal achievement above  Goal Update: see above   Plan: see above    Reason Skilled Services are Required: continuing to address cervical spine limitations, unsteadiness and working toward improved functional mobility.  Thank you for the referral of this patient. Rudell Cobb, MPT   [place], PT 01/22/2019, 12:24 PM  [facility] [address] [place], [place], [address] Phone: [phone]   Fax:  [phone]  Name: Jeffrey [name] MRN: [id number] Date of Birth: 1945-09-06

## 2019-01-27 ENCOUNTER — Other Ambulatory Visit: Payer: Self-pay

## 2019-01-27 ENCOUNTER — Ambulatory Visit: Payer: Medicare HMO | Admitting: Rehabilitative and Restorative Service Providers"

## 2019-01-27 ENCOUNTER — Encounter: Payer: Self-pay | Admitting: Rehabilitative and Restorative Service Providers"

## 2019-01-27 DIAGNOSIS — R262 Difficulty in walking, not elsewhere classified: Secondary | ICD-10-CM | POA: Diagnosis not present

## 2019-01-27 DIAGNOSIS — M6281 Muscle weakness (generalized): Secondary | ICD-10-CM | POA: Diagnosis not present

## 2019-01-27 DIAGNOSIS — R2681 Unsteadiness on feet: Secondary | ICD-10-CM

## 2019-01-27 DIAGNOSIS — R42 Dizziness and giddiness: Secondary | ICD-10-CM | POA: Diagnosis not present

## 2019-01-27 DIAGNOSIS — M542 Cervicalgia: Secondary | ICD-10-CM

## 2019-01-27 DIAGNOSIS — R293 Abnormal posture: Secondary | ICD-10-CM | POA: Diagnosis not present

## 2019-01-27 NOTE — Therapy (Addendum)
[facility] [address] [place], [place], [address] Phone: [phone]   Fax:  [phone]  Physical Therapy Treatment and Discharge Summary  Patient Details  Name: Jeffrey [name] MRN: [id number] Date of Birth: 08/19/1945 Referring Provider (PT): Jacob Fletcher, MD; Todd D McDiarmid, MD   Encounter Date: 01/27/2019  PT End of Session - 01/27/19 0933    Visit Number  11    Number of Visits  17    Date for PT Re-Evaluation  02/10/19    Authorization Type  [name] Medicare and Medicaid; $40 copay after using all Medicaid visits.  VL: Prior Authorization.  10th visit PN    PT Start Time  0934    PT Stop Time  1005    PT Time Calculation (min)  31 min    Activity Tolerance  Patient tolerated treatment well    Behavior During Therapy  WFL for tasks assessed/performed       Past Medical History:  Diagnosis Date  . Colon polyps   . GERD (gastroesophageal reflux disease)   . HIV (human immunodeficiency virus infection) (HCC) 1999    Past Surgical History:  Procedure Laterality Date  . CHOLECYSTECTOMY  1982  . ESOPHAGEAL MANOMETRY N/A 09/03/2017   Procedure: ESOPHAGEAL MANOMETRY (EM);  Surgeon: Nandigam, Kavitha V, MD;  Location: [facility];  Service: Endoscopy;  Laterality: N/A;  . KNEE SURGERY     LEFT KNEE  . TONSILLECTOMY      There were no vitals filed for this visit.  Subjective Assessment - 01/27/19 0935    Subjective  The patient reports dizziness and balance improved.  He continues with neck stiffness worse in the morning.    Pertinent History  HIV, diastolic CHF, back pain, HTN, and CKD    Patient Stated Goals  Find out why I am staggering, is it permanent?    Currently in Pain?  Yes    Pain Score  3     Pain Location  Neck    Pain Orientation  Right    Pain Descriptors / Indicators  Aching    Pain Type  Acute pain    Pain Onset  More than a month ago    Pain Frequency  Intermittent    Aggravating  Factors   worse in the morning    Pain Relieving Factors  improved with a hot shower    Effect of Pain on Daily Activities  hands hurts due to arthritis.         [facility] PT Assessment - 01/27/19 0941      AROM   Overall AROM   Deficits    Overall AROM Comments  Continued deficits in L sidebending with patient reports of tightness on the R side that limits movement.      Strength   Strength Assessment Site  Shoulder;Elbow    Right Shoulder Flexion  4+/5    Right Shoulder ABduction  4+/5    Right Shoulder Internal Rotation  4+/5    Right Shoulder External Rotation  4+/5    Right Elbow Flexion  5/5    Right/Left Hip  --   4+/5 R hip flexion with hip pain                  [facility] Adult PT Treatment/Exercise - 01/27/19 0946      Neuro Re-ed    Neuro Re-ed Details   Reviewed balance components of HEP including tandem stance, feet together + eyes closed, feet   together + head motion.      Exercises   Exercises  Neck;Shoulder      Neck Exercises: Supine   Neck Retraction  5 secs;10 reps    Neck Retraction Limitations  reviewed technique for HEP.      Shoulder Exercises: Supine   Flexion  Strengthening;Right;5 reps    Flexion Limitations  demonstrated change in technique to anchor with L hand and pull with R UE.  Modified theraband due ot c/o thumb tightness.               PT Short Term Goals - 01/08/19 0817      PT SHORT TERM GOAL #1   Title  Pt will participate in further assessment of gait and balance with FGA and SOT    Time  4    Period  Weeks    Status  Achieved    Target Date  01/11/19      PT SHORT TERM GOAL #2   Title  Pt will report 25% greater use of RUE to wash his back and </= 3/10 pain in R shoulder with IR/ER    Baseline  5/10 R shoulder pain with IR to reach behind back - able to reach to inferior tip of scapula    Time  4    Period  Weeks    Status  Partially Met    Target Date  01/11/19      PT SHORT TERM GOAL #3   Title  Pt will  improve cervical ROM by 5-8 degrees in all movements    Baseline  see flow sheet    Time  4    Period  Weeks    Status  Achieved    Target Date  01/11/19      PT SHORT TERM GOAL #4   Title  Pt will participate in more thorough vestibular evaluation due to improvement in overall cervical ROM    Time  4    Period  Weeks    Status  Achieved    Target Date  01/11/19        PT Long Term Goals - 01/27/19 0947      PT LONG TERM GOAL #1   Title  Pt will demonstrate independence with final HEP - cervical ROM, UE/LE strength, balance, vestibular  (ALL LTG DUE BY 02/10/2019)    Time  8    Period  Weeks    Status  Achieved      PT LONG TERM GOAL #2   Title  Pt will demonstrate 10-12 degree improvement in cervical spine ROM    Baseline  see flowsheets    Time  8    Period  Weeks    Status  Partially Met      PT LONG TERM GOAL #3   Title  Pt will demonstrate improvement in RUE and RLE strength by one mm grade (MMT)    Baseline  see flowsheet    Time  8    Period  Weeks    Status  Achieved      PT LONG TERM GOAL #4   Title  Pt will demonstrate decreased falls risk as indicated by 4 point improvement in FGA    Baseline  21/30 on 01/22/19 improved from 15/30    Time  8    Period  Weeks    Status  Achieved      PT LONG TERM GOAL #5   Title  Pt will demonstrate SOT   composite score to WNL for age group    Baseline  deferred    Time  8    Period  Weeks    Status  Deferred      PT LONG TERM GOAL #6   Title  Vestibular goal if needed    Status  Achieved            Plan - 01/27/19 1011    Clinical Impression Statement  The patient has partially met LTGs.  He is reporting no dizziness or imbalance at this time.  He has HEP to address neck tightness and general stabilization.  PT recommended return to community exercise (using mask, distancing and hand washing) and continue to progress HEP.    Comorbidities  HIV, diastolic CHF, back pain, HTN, and CKD    PT  Treatment/Interventions  ADLs/Self Care Home Management;Canalith Repostioning;Cryotherapy;Moist Heat;Gait training;Functional mobility training;Therapeutic activities;Therapeutic exercise;Balance training;Neuromuscular re-education;Patient/family education;Manual techniques;Passive range of motion;Taping;Vestibular;Spinal Manipulations    PT Next Visit Plan  Discharge.    Consulted and Agree with Plan of Care  Patient       Patient will benefit from skilled therapeutic intervention in order to improve the following deficits and impairments:  Decreased balance, Decreased coordination, Decreased mobility, Decreased range of motion, Decreased strength, Difficulty walking, Dizziness, Impaired flexibility, Impaired sensation, Impaired UE functional use, Pain  Visit Diagnosis: Unsteadiness on feet  Dizziness and giddiness  Muscle weakness (generalized)  Cervicalgia  Difficulty in walking, not elsewhere classified  Abnormal posture    PHYSICAL THERAPY DISCHARGE SUMMARY  Visits from Start of Care: 11  Current functional level related to goals / functional outcomes: See above   Remaining deficits: Stiffness in c-spine in the morning   Education / Equipment: Home program, community exercise.  Plan: Patient agrees to discharge.  Patient goals were not met. Patient is being discharged due to meeting the stated rehab goals.  ?????         Thank you for the referral of this patient.  , MPT   ,, PT 01/27/2019, 10:15 AM  [facility] [address] [place], [place], [address] Phone: [phone]   Fax:  [phone]  Name: Jeffrey [name] MRN: [id number] Date of Birth: 08/31/1945   

## 2019-01-27 NOTE — Patient Instructions (Signed)
Access Code: [email or web address]  URL: [email or web address]  Date: 01/27/2019  Prepared by: Rudell Cobb   Program Notes  If these exercises increase chest pain or shortness of breath, hold exercise until evaluated by cardiologist.   Exercises Supine Thoracic Mobilization Towel Roll Vertical with Arm Stretch - 1 reps - 1 sets - 2 minutes hold - 2x daily - 7x weekly Supine Shoulder Horizontal Abduction with Resistance - 10 reps - 3 sets - 1x daily - 7x weekly Supine Shoulder Flexion with Anchored Resistance - 10 reps - 3 sets - 1x daily - 7x weekly Supine Cervical Retraction with Towel - 10 reps - 1 sets - 3 second hold - 1x daily - 7x weekly Seated Assisted Cervical Rotation with Towel - 3 sets - 15 seconds hold - 1x daily - 7x weekly Upper Trapezius Stretch - 3 sets - 30 second hold - 1x daily - 7x weekly Tandem Stance - 3 reps - 1 sets - 30 seconds hold - 1x daily - 7x weekly Standing with Eyes Closed - 3 reps - 1 sets - 30 seconds hold - 1x daily - 7x weekly Romberg Stance with Head Rotation - 10 reps - 1 sets - 1x daily - 7x weekly

## 2019-01-29 ENCOUNTER — Ambulatory Visit: Payer: Medicare HMO | Admitting: Physical Therapy

## 2019-02-03 ENCOUNTER — Ambulatory Visit: Payer: Medicare HMO | Admitting: Rehabilitative and Restorative Service Providers"

## 2019-02-05 ENCOUNTER — Encounter: Payer: Medicare HMO | Admitting: Physical Therapy

## 2019-02-10 ENCOUNTER — Other Ambulatory Visit: Payer: Self-pay

## 2019-02-10 ENCOUNTER — Other Ambulatory Visit: Payer: Medicare HMO

## 2019-02-10 DIAGNOSIS — B20 Human immunodeficiency virus [HIV] disease: Secondary | ICD-10-CM | POA: Diagnosis not present

## 2019-02-11 LAB — T-HELPER CELL (CD4) - (RCID CLINIC ONLY)
CD4 % Helper T Cell: 23 % — ABNORMAL LOW (ref 33–65)
CD4 T Cell Abs: 567 /uL (ref 400–1790)

## 2019-02-12 ENCOUNTER — Encounter: Payer: Medicare HMO | Admitting: Rehabilitative and Restorative Service Providers"

## 2019-02-12 DIAGNOSIS — R351 Nocturia: Secondary | ICD-10-CM | POA: Diagnosis not present

## 2019-02-12 DIAGNOSIS — R311 Benign essential microscopic hematuria: Secondary | ICD-10-CM | POA: Diagnosis not present

## 2019-02-12 DIAGNOSIS — N401 Enlarged prostate with lower urinary tract symptoms: Secondary | ICD-10-CM | POA: Diagnosis not present

## 2019-02-12 DIAGNOSIS — N5201 Erectile dysfunction due to arterial insufficiency: Secondary | ICD-10-CM | POA: Diagnosis not present

## 2019-02-12 LAB — COMPREHENSIVE METABOLIC PANEL
AG Ratio: 1.7 (calc) (ref 1.0–2.5)
ALT: 18 U/L (ref 9–46)
AST: 17 U/L (ref 10–35)
Albumin: 4 g/dL (ref 3.6–5.1)
Alkaline phosphatase (APISO): 77 U/L (ref 35–144)
BUN/Creatinine Ratio: 20 (calc) (ref 6–22)
BUN: 31 mg/dL — ABNORMAL HIGH (ref 7–25)
CO2: 28 mmol/L (ref 20–32)
Calcium: 9.2 mg/dL (ref 8.6–10.3)
Chloride: 108 mmol/L (ref 98–110)
Creat: 1.53 mg/dL — ABNORMAL HIGH (ref 0.70–1.18)
Globulin: 2.3 g/dL (calc) (ref 1.9–3.7)
Glucose, Bld: 76 mg/dL (ref 65–99)
Potassium: 4.3 mmol/L (ref 3.5–5.3)
Sodium: 141 mmol/L (ref 135–146)
Total Bilirubin: 0.4 mg/dL (ref 0.2–1.2)
Total Protein: 6.3 g/dL (ref 6.1–8.1)

## 2019-02-12 LAB — CBC
HCT: 39.8 % (ref 38.5–50.0)
Hemoglobin: 13.7 g/dL (ref 13.2–17.1)
MCH: 35.2 pg — ABNORMAL HIGH (ref 27.0–33.0)
MCHC: 34.4 g/dL (ref 32.0–36.0)
MCV: 102.3 fL — ABNORMAL HIGH (ref 80.0–100.0)
MPV: 8.8 fL (ref 7.5–12.5)
Platelets: 187 10*3/uL (ref 140–400)
RBC: 3.89 10*6/uL — ABNORMAL LOW (ref 4.20–5.80)
RDW: 12.2 % (ref 11.0–15.0)
WBC: 5.9 10*3/uL (ref 3.8–10.8)

## 2019-02-12 LAB — HIV-1 RNA QUANT-NO REFLEX-BLD
HIV 1 RNA Quant: <20 copies/mL
HIV-1 RNA Quant, Log: <1.3 Log copies/mL

## 2019-02-14 ENCOUNTER — Encounter: Payer: Medicare HMO | Admitting: Rehabilitative and Restorative Service Providers"

## 2019-02-24 ENCOUNTER — Ambulatory Visit (INDEPENDENT_AMBULATORY_CARE_PROVIDER_SITE_OTHER): Payer: Medicare HMO | Admitting: Internal Medicine

## 2019-02-24 ENCOUNTER — Other Ambulatory Visit: Payer: Self-pay

## 2019-02-24 DIAGNOSIS — R079 Chest pain, unspecified: Secondary | ICD-10-CM | POA: Insufficient documentation

## 2019-02-24 DIAGNOSIS — B20 Human immunodeficiency virus [HIV] disease: Secondary | ICD-10-CM

## 2019-02-24 NOTE — Assessment & Plan Note (Signed)
His infection remains under excellent, long-term control.  He has already received his influenza vaccine this season.  He will continue Triumeq and follow-up after lab work in 1 year.

## 2019-02-24 NOTE — Progress Notes (Signed)
Patient Active Problem List   Diagnosis Date Noted  . Chest pain 02/24/2019    Priority: High  . Human immunodeficiency virus (HIV) disease ([place]) 02/27/2006    Priority: High  . Left ankle pain 08/07/2013    Priority: Medium  . Dyslipidemia 07/22/2012    Priority: Medium  . Erectile dysfunction 07/22/2012    Priority: Medium  . GERD 05/17/2010    Priority: Medium  . Dysphagia 03/08/2009    Priority: Medium  . Essential hypertension 02/27/2006    Priority: Medium  . Peripheral vertigo 11/29/2018  . Encounter for health maintenance examination in adult 12/20/2017  . Chronic kidney disease 12/20/2017  . Diastolic CHF with preserved left ventricular function, NYHA class 2 ([place]) 01/06/2015  . Muscle spasm of back 10/15/2014  . Exertional dyspnea 09/30/2014  . Insomnia 07/22/2014  . BPH with obstruction/lower urinary tract symptoms 06/17/2014  . Screening for colon cancer 10/07/2013  . CERVICAL LYMPHADENOPATHY 09/19/2007  . CHOLECYSTECTOMY, HX OF 05/28/2006  . PNEUMONIA, HX OF 02/27/2006  . ARTHROSCOPY, KNEE, HX OF 02/27/2006    Patient's Medications  New Prescriptions   No medications on file  Previous Medications   ASPIRIN EC 81 MG TABLET    Take 1 tablet (81 mg total) by mouth daily.   CYCLOBENZAPRINE (FLEXERIL) 5 MG TABLET    Take 1-2 tablets (5-10 mg total) by mouth 2 (two) times daily as needed for muscle spasms.   DICLOFENAC (VOLTAREN) 50 MG EC TABLET    Take 1 tablet (50 mg total) by mouth 2 (two) times daily.   DICYCLOMINE (BENTYL) 10 MG CAPSULE    Take 1-2 capsules (10-20 mg total) by mouth every 8 (eight) hours as needed for spasms.   FINASTERIDE (PROSCAR) 5 MG TABLET    Take 5 mg by mouth daily.   MULTIPLE VITAMINS-MINERALS (MULTIVITAMIN PO)    Take 1 tablet by mouth daily.   NITROGLYCERIN (NITROSTAT) 0.4 MG SL TABLET    Place 1 tablet (0.4 mg total) under the tongue every 5 (five) minutes as needed for chest pain.   PANTOPRAZOLE (PROTONIX) 40 MG  TABLET    Take 1 tablet (40 mg total) by mouth 2 (two) times daily before a meal. Please schedule a yearly follow up for further refills: [phone]   SUCRALFATE (CARAFATE) 1 GM/10ML SUSPENSION    Take 10 mLs (1 g total) by mouth every 6 (six) hours as needed.   TAMSULOSIN (FLOMAX) 0.4 MG CAPS CAPSULE    TAKE TWO CAPSULES BY MOUTH ONCE DAILY   TRIUMEQ 600-50-300 MG TABLET    Take 1 tablet by mouth once daily  Modified Medications   No medications on file  Discontinued Medications   No medications on file    Subjective: Jeffrey [name] is in for his routine HIV follow-up visit.  Despite the Covid pandemic he has had no problems obtaining, taking or tolerating his Triumeq and does not recall missing any doses in the past year.  He has been mostly staying at home except for occasional trips to the grocery store.  The last 6 months or so he has developed several new problems.  He has noticed that when he walks he tends to veer to the right.  He does not have any pain or dizziness when he is walking.  He does not notice any focal weakness.  He has been undergoing physical therapy but has not noted any improvement and says that no one has been able  to tell him why this happens.  He has also developed some chest pain.  It can come on with exertion and at rest.  He was given nitroglycerin and took his first 1 last week.  His pain went away promptly but he got a severe headache.  He has also noted worsening dyspnea on exertion over the past several years.  He is scheduled to be evaluated by cardiology later this week.  Review of Systems: Review of Systems  Constitutional: Negative for fever, malaise/fatigue and weight loss.  HENT: Negative for congestion and sore throat.   Respiratory: Positive for shortness of breath. Negative for cough and sputum production.   Cardiovascular: Positive for chest pain.  Gastrointestinal: Negative for abdominal pain, diarrhea, nausea and vomiting.  Musculoskeletal: Positive for  joint pain.  Psychiatric/Behavioral: Negative for depression.    Past Medical History:  Diagnosis Date  . Colon polyps   . GERD (gastroesophageal reflux disease)   . HIV (human immunodeficiency virus infection) ([place]) 1999    Social History   Tobacco Use  . Smoking status: Never Smoker  . Smokeless tobacco: Former Systems developer    Types: Chew  . Tobacco comment: Rare use, about q 6 months  Substance Use Topics  . Alcohol use: Yes    Comment: rare  . Drug use: No    Family History  Problem Relation Age of Onset  . Hypertension Mother   . Heart attack Mother 28  . Colon cancer Neg Hx   . Esophageal cancer Neg Hx   . Pancreatic cancer Neg Hx   . Stomach cancer Neg Hx   . Liver disease Neg Hx     Allergies  Allergen Reactions  . Baclofen     Back pain. Patient said it made him feel crazy  . Efavirenz     Rash  . Nevirapine     Rash    Health Maintenance  Topic Date Due  . COLONOSCOPY  08/03/2020  . TETANUS/TDAP  01/05/2025  . INFLUENZA VACCINE  Completed  . Hepatitis C Screening  Completed  . PNA vac Low Risk Adult  Completed    Objective:  Vitals:   02/24/19 1346  Weight: 172 lb (78 kg)   Body mass index is 24.68 kg/m.  Physical Exam Constitutional:      Comments: He is in good spirits.  Cardiovascular:     Rate and Rhythm: Normal rate and regular rhythm.     Heart sounds: No murmur.  Pulmonary:     Effort: Pulmonary effort is normal.     Breath sounds: Normal breath sounds.  Abdominal:     Palpations: Abdomen is soft.     Tenderness: There is no abdominal tenderness.  Musculoskeletal:     Right lower leg: No edema.     Left lower leg: No edema.  Psychiatric:        Mood and Affect: Mood normal.     Lab Results Lab Results  Component Value Date   WBC 5.9 02/10/2019   HGB 13.7 02/10/2019   HCT 39.8 02/10/2019   MCV 102.3 (H) 02/10/2019   PLT 187 02/10/2019    Lab Results  Component Value Date   CREATININE 1.53 (H) 02/10/2019   BUN 31  (H) 02/10/2019   NA 141 02/10/2019   K 4.3 02/10/2019   CL 108 02/10/2019   CO2 28 02/10/2019    Lab Results  Component Value Date   ALT 18 02/10/2019   AST 17 02/10/2019   ALKPHOS  77 11/26/2018   BILITOT 0.4 02/10/2019    Lab Results  Component Value Date   CHOL 181 07/25/2018   HDL 48 07/25/2018   LDLCALC 111 (H) 07/25/2018   TRIG 109 07/25/2018   CHOLHDL 3.8 07/25/2018   Lab Results  Component Value Date   LABRPR NON-REACTIVE 07/25/2018   HIV 1 RNA Quant (copies/mL)  Date Value  02/10/2019 <20 NOT DETECTED  07/25/2018 <20 NOT DETECTED  01/23/2018 <20 DETECTED (A)   CD4 T Cell Abs (/uL)  Date Value  02/10/2019 567  07/25/2018 290 (L)  01/23/2018 540     Problem List Items Addressed This Visit      High   Human immunodeficiency virus (HIV) disease ([place])    His infection remains under excellent, long-term control.  He has already received his influenza vaccine this season.  He will continue Triumeq and follow-up after lab work in 1 year.      Relevant Orders   CBC   T-helper cell (CD4)- ([facility] clinic only)   Comprehensive metabolic panel   Lipid panel   RPR   HIV-1 RNA quant-no reflex-bld   Chest pain    I told him to use the nitroglycerin if he has recurrent chest pain even if it causes headache.  He is scheduled to see Dr. Johnsie Cancel later this week.           [name], MD [facility] [phone] pager   [phone] cell 02/24/2019, 2:05 PM

## 2019-02-24 NOTE — Assessment & Plan Note (Signed)
I told him to use the nitroglycerin if he has recurrent chest pain even if it causes headache.  He is scheduled to see Dr. Johnsie Cancel later this week.

## 2019-02-25 NOTE — Progress Notes (Signed)
CARDIOLOGY CONSULT NOTE       Patient ID: Jeffrey [name] MRN: [id number] DOB/AGE: 01-14-1946 73 y.o.  Admit date: (Not on file) Referring Physician: Marcella Dubs  Primary Physician: Danna Hefty, DO Primary Cardiologist: New Reason for Consultation: Dyspnea  Active Problems:   * No active hospital problems. *   HPI:  73 y.o. with HTN, HIV, GERD and ? Diastolic dysfunction. Complained of dyspnea on exertion and chest pain for over a year. Chest pain sharp and left sided ? Murmur with skipped beats during activity per physical therapist Symptoms better at rest Seen by me in 2016 for same. Echo with EF 50-55% only abnormal relaxation on diastolic evaluation normal ETT going 7 minutes on treadmill CXR at that time with hyper inflation   ? In April had paresthesias in right arm and difficulty walking straight Did not have MRI / CT or stroke w/u. Complains of dyspnea with exertion especially going up steps and associated with some tightness in chest   ROS All other systems reviewed and negative except as noted above  Past Medical History:  Diagnosis Date  . Colon polyps   . GERD (gastroesophageal reflux disease)   . HIV (human immunodeficiency virus infection) ([place]) 1999    Family History  Problem Relation Age of Onset  . Hypertension Mother   . Heart attack Mother 68  . Colon cancer Neg Hx   . Esophageal cancer Neg Hx   . Pancreatic cancer Neg Hx   . Stomach cancer Neg Hx   . Liver disease Neg Hx     Social History   Socioeconomic History  . Marital status: Divorced    Spouse name: Not on file  . Number of children: Not on file  . Years of education: Not on file  . Highest education level: Not on file  Occupational History  . Occupation: retired    Fish farm manager: [employer]  Social Needs  . Financial resource strain: Not on file  . Food insecurity    Worry: Not on file    Inability: Not on file  . Transportation needs    Medical: Not on file    Non-medical:  Not on file  Tobacco Use  . Smoking status: Never Smoker  . Smokeless tobacco: Former Systems developer    Types: Chew  . Tobacco comment: Rare use, about q 6 months  Substance and Sexual Activity  . Alcohol use: Yes    Comment: rare  . Drug use: No  . Sexual activity: Yes    Partners: Female    Comment: declined condoms  Lifestyle  . Physical activity    Days per week: Not on file    Minutes per session: Not on file  . Stress: Not on file  Relationships  . Social Herbalist on phone: Not on file    Gets together: Not on file    Attends religious service: Not on file    Active member of club or organization: Not on file    Attends meetings of clubs or organizations: Not on file    Relationship status: Not on file  . Intimate partner violence    Fear of current or ex partner: Not on file    Emotionally abused: Not on file    Physically abused: Not on file    Forced sexual activity: Not on file  Other Topics Concern  . Not on file  Social History Narrative  . Not on file    Past Surgical  History:  Procedure Laterality Date  . CHOLECYSTECTOMY  1982  . ESOPHAGEAL MANOMETRY N/A 09/03/2017   Procedure: ESOPHAGEAL MANOMETRY (EM);  Surgeon: Mauri Pole, MD;  Location: [facility];  Service: Endoscopy;  Laterality: N/A;  . KNEE SURGERY     LEFT KNEE  . TONSILLECTOMY          Physical Exam: Blood pressure 140/88, pulse 80, height 5' 10"  (1.778 m), weight 171 lb (77.6 kg), SpO2 98 %.   Affect appropriate Healthy:  appears stated age 73: normal Neck supple with no adenopathy JVP normal no bruits no thyromegaly Lungs clear with no wheezing and good diaphragmatic motion Heart:  S1/S2 no murmur, no rub, gallop or click PMI normal Abdomen: benighn, BS positve, no tenderness, no AAA no bruit.  No HSM or HJR Distal pulses intact with no bruits No edema Neuro non-focal Skin warm and dry No muscular weakness   Labs:   Lab Results  Component Value Date    WBC 5.9 02/10/2019   HGB 13.7 02/10/2019   HCT 39.8 02/10/2019   MCV 102.3 (H) 02/10/2019   PLT 187 02/10/2019   No results for input(s): NA, K, CL, CO2, BUN, CREATININE, CALCIUM, PROT, BILITOT, ALKPHOS, ALT, AST, GLUCOSE in the last 168 hours.  Invalid input(s): LABALBU Lab Results  Component Value Date   CKTOTAL 51 04/12/2010   CKMB 1.6 04/12/2010   TROPONINI <0.01        NO INDICATION OF MYOCARDIAL INJURY. 04/12/2010    Lab Results  Component Value Date   CHOL 181 07/25/2018   CHOL 209 (H) 07/24/2017   CHOL 190 07/06/2016   Lab Results  Component Value Date   HDL 48 07/25/2018   HDL 48 07/24/2017   HDL 53 07/06/2016   Lab Results  Component Value Date   LDLCALC 111 (H) 07/25/2018   LDLCALC 125 (H) 07/24/2017   LDLCALC 113 (H) 07/06/2016   Lab Results  Component Value Date   TRIG 109 07/25/2018   TRIG 218 (H) 07/24/2017   TRIG 122 07/06/2016   Lab Results  Component Value Date   CHOLHDL 3.8 07/25/2018   CHOLHDL 4.4 07/24/2017   CHOLHDL 3.6 07/06/2016   No results found for: LDLDIRECT    Radiology: No results found.  EKG: SR rate 61 normal 01/21/19    ASSESSMENT AND PLAN:   1. Dyspnea:  Should have f/u PFTls per primary repeat echo for systolic and diastolic parameters 2. Chest Pain: some typical features with exertion. Normal ETT 2016 favor cardiac CTA which will allow [place] to evaluate lung fields in regard to #1 as well Last Cr 15 will re check 3. HIV:  Continue anti-retroviral Rx f/u Dr Wendie Simmer ID 4. Prostate:  On proscar f/u PSA with primary  5. TIA:  In April has TIA to my history with right sided weakness and paresthesias. Will order MRI head to further evaluate   Signed: Jenkins Rouge 02/27/2019, 5:08 PM

## 2019-02-27 ENCOUNTER — Encounter: Payer: Self-pay | Admitting: Cardiovascular Disease

## 2019-02-27 ENCOUNTER — Ambulatory Visit (INDEPENDENT_AMBULATORY_CARE_PROVIDER_SITE_OTHER): Payer: Medicare HMO | Admitting: Cardiovascular Disease

## 2019-02-27 ENCOUNTER — Other Ambulatory Visit: Payer: Self-pay

## 2019-02-27 VITALS — BP 140/88 | HR 80 | Ht 70.0 in | Wt 171.0 lb

## 2019-02-27 DIAGNOSIS — R079 Chest pain, unspecified: Secondary | ICD-10-CM

## 2019-02-27 DIAGNOSIS — R0602 Shortness of breath: Secondary | ICD-10-CM | POA: Diagnosis not present

## 2019-02-27 DIAGNOSIS — G459 Transient cerebral ischemic attack, unspecified: Secondary | ICD-10-CM | POA: Diagnosis not present

## 2019-02-27 MED ORDER — METOPROLOL TARTRATE 50 MG PO TABS: ORAL_TABLET | ORAL | 0 refills | Status: DC

## 2019-02-27 NOTE — Patient Instructions (Addendum)
Medication Instructions:   *If you need a refill on your cardiac medications before your next appointment, please call your pharmacy*  Lab Work:  If you have labs (blood work) drawn today and your tests are completely normal, you will receive your results only by: Marland Kitchen MyChart Message (if you have MyChart) OR . A paper copy in the mail If you have any lab test that is abnormal or we need to change your treatment, we will call you to review the results.  Testing/Procedures: Your physician has requested that you have an echocardiogram. Echocardiography is a painless test that uses sound waves to create images of your heart. It provides your doctor with information about the size and shape of your heart and how well your heart's chambers and valves are working. This procedure takes approximately one hour. There are no restrictions for this procedure.  Your physician has requested that you have cardiac CT. Cardiac computed tomography (CT) is a painless test that uses an x-ray machine to take clear, detailed pictures of your heart. For further information please visit [email or web address]. Please follow instruction sheet as given.  Follow-Up: At [facility], you and your health needs are our priority.  As part of our continuing mission to provide you with exceptional heart care, we have created designated Provider Care Teams.  These Care Teams include your primary Cardiologist (physician) and Advanced Practice Providers (APPs -  Physician Assistants and Nurse Practitioners) who all work together to provide you with the care you need, when you need it.  Your next appointment:   3 months  The format for your next appointment:   In Person  Provider:   You may see Dr. Johnsie Cancel or one of the following Advanced Practice Providers on your designated Care Team:    Truitt Merle, NP  Cecilie Kicks, NP  Kathyrn Drown, NP   Your cardiac CT will be scheduled at one of the below locations:   [facility] [address] [place], [place] [address] [phone]   If scheduled at [facility], please arrive at the [facility] main entrance of [facility] 30-45 minutes prior to test start time. Proceed to the [facility] Radiology Department (first floor) to check-in and test prep.  Please follow these instructions carefully (unless otherwise directed):  Hold all erectile dysfunction medications at least 3 days (72 hrs) prior to test.  On the Night Before the Test: . Be sure to Drink plenty of water. . Do not consume any caffeinated/decaffeinated beverages or chocolate 12 hours prior to your test. . Do not take any antihistamines 12 hours prior to your test. . Take metoprolol (Lopressor) 50 mg the night prior to test  On the Day of the Test: . Drink plenty of water. Do not drink any water within one hour of the test. . Do not eat any food 4 hours prior to the test. . You may take your regular medications prior to the test.  . Take metoprolol (Lopressor) 50 mg two hours prior to test. . HOLD Furosemide/Hydrochlorothiazide morning of the test.    *For Clinical Staff only. Please instruct patient the following:*        -Drink plenty of water       -Hold Furosemide/hydrochlorothiazide morning of the test       -Take metoprolol (Lopressor) 2 hours prior to test (if applicable).                  -If HR is  less than 55 BPM- No Beta Blocker                -IF HR is greater than 55 BPM and patient is less than or equal to 71 yrs old Lopressor 156m x1.                -If HR is greater than 55 BPM and patient is greater than 724yrs old Lopressor 50 mg x1.     Do not give Lopressor to patients with an allergy to lopressor or anyone with asthma or active COPD symptoms (currently taking steroids).       After the Test: . Drink plenty of water. . After receiving IV contrast, you may experience a mild flushed feeling. This is normal. . On occasion, you may  experience a mild rash up to 24 hours after the test. This is not dangerous. If this occurs, you can take Benadryl 25 mg and increase your fluid intake. . If you experience trouble breathing, this can be serious. If it is severe call 911 IMMEDIATELY. If it is mild, please call our office. . If you take any of these medications: Glipizide/Metformin, Avandament, Glucavance, please do not take 48 hours after completing test unless otherwise instructed.   Once we have confirmed authorization from your insurance company, we will call you to set up a date and time for your test.   For non-scheduling related questions, please contact the cardiac imaging nurse navigator should you have any questions/concerns: SMarchia Bond RN Navigator Cardiac Imaging [facility] [phone]

## 2019-02-28 ENCOUNTER — Telehealth: Payer: Self-pay

## 2019-02-28 DIAGNOSIS — G459 Transient cerebral ischemic attack, unspecified: Secondary | ICD-10-CM

## 2019-02-28 DIAGNOSIS — R531 Weakness: Secondary | ICD-10-CM

## 2019-02-28 LAB — BASIC METABOLIC PANEL
BUN/Creatinine Ratio: 18 (ref 10–24)
BUN: 27 mg/dL (ref 8–27)
CO2: 23 mmol/L (ref 20–29)
Calcium: 9.2 mg/dL (ref 8.6–10.2)
Chloride: 106 mmol/L (ref 96–106)
Creatinine, Ser: 1.48 mg/dL — ABNORMAL HIGH (ref 0.76–1.27)
GFR calc Af Amer: 54 mL/min/{1.73_m2} — ABNORMAL LOW (ref >59)
GFR calc non Af Amer: 47 mL/min/{1.73_m2} — ABNORMAL LOW (ref >59)
Glucose: 102 mg/dL — ABNORMAL HIGH (ref 65–99)
Potassium: 4.3 mmol/L (ref 3.5–5.2)
Sodium: 141 mmol/L (ref 134–144)

## 2019-02-28 NOTE — Telephone Encounter (Signed)
Will order test and send message to scheduling.

## 2019-02-28 NOTE — Telephone Encounter (Signed)
-----   Message from Josue Hector, MD sent at 02/27/2019  5:08 PM EDT ----- Also needs MRI head for TIA and right sided weakness

## 2019-03-04 ENCOUNTER — Telehealth: Payer: Self-pay

## 2019-03-04 NOTE — Telephone Encounter (Signed)
LMTCB 10/27

## 2019-03-05 ENCOUNTER — Ambulatory Visit (HOSPITAL_COMMUNITY): Payer: Medicare HMO | Attending: Cardiovascular Disease

## 2019-03-05 ENCOUNTER — Other Ambulatory Visit: Payer: Self-pay

## 2019-03-05 DIAGNOSIS — R079 Chest pain, unspecified: Secondary | ICD-10-CM | POA: Insufficient documentation

## 2019-03-05 DIAGNOSIS — R0602 Shortness of breath: Secondary | ICD-10-CM | POA: Diagnosis not present

## 2019-03-09 ENCOUNTER — Other Ambulatory Visit: Payer: Self-pay | Admitting: Gastroenterology

## 2019-03-09 ENCOUNTER — Other Ambulatory Visit: Payer: Self-pay | Admitting: Internal Medicine

## 2019-03-09 DIAGNOSIS — B20 Human immunodeficiency virus [HIV] disease: Secondary | ICD-10-CM

## 2019-03-10 ENCOUNTER — Telehealth: Payer: Self-pay

## 2019-03-10 NOTE — Telephone Encounter (Signed)
Called pt. No answer, left message for pt to return call.

## 2019-03-10 NOTE — Telephone Encounter (Signed)
-----   Message from Josue Hector, MD sent at 03/10/2019  9:03 AM EST ----- Normal echo with good EF and no significant valvular heart disease

## 2019-03-12 ENCOUNTER — Other Ambulatory Visit: Payer: Self-pay | Admitting: Gastroenterology

## 2019-03-12 MED ORDER — PANTOPRAZOLE SODIUM 40 MG PO TBEC: 40.0000 mg | DELAYED_RELEASE_TABLET | Freq: Two times a day (BID) | ORAL | 0 refills | Status: DC

## 2019-03-12 NOTE — Telephone Encounter (Signed)
Refilled Pantoprazole

## 2019-03-12 NOTE — Telephone Encounter (Signed)
Pt is scheduled 04/23/19 OV and requested a refill for pantoprazole.

## 2019-03-19 ENCOUNTER — Telehealth (HOSPITAL_COMMUNITY): Payer: Self-pay | Admitting: Emergency Medicine

## 2019-03-19 NOTE — Telephone Encounter (Signed)
Left message on voicemail with name and callback number Erling Arrazola RN Navigator Cardiac Imaging [facility] [phone] Office [phone] Cell  

## 2019-03-21 ENCOUNTER — Ambulatory Visit (HOSPITAL_COMMUNITY)
Admission: RE | Admit: 2019-03-21 | Discharge: 2019-03-21 | Disposition: A | Discharge status: Home / Self Care | Payer: Medicare HMO | Source: Ambulatory Visit | Attending: Cardiovascular Disease | Admitting: Cardiovascular Disease

## 2019-03-21 ENCOUNTER — Ambulatory Visit (HOSPITAL_COMMUNITY): Admission: RE | Admit: 2019-03-21 | Payer: Medicare HMO | Source: Ambulatory Visit

## 2019-03-21 ENCOUNTER — Telehealth: Payer: Self-pay | Admitting: Cardiovascular Disease

## 2019-03-21 ENCOUNTER — Other Ambulatory Visit: Payer: Self-pay | Admitting: Cardiovascular Disease

## 2019-03-21 ENCOUNTER — Other Ambulatory Visit: Payer: Self-pay

## 2019-03-21 DIAGNOSIS — R531 Weakness: Secondary | ICD-10-CM

## 2019-03-21 DIAGNOSIS — R079 Chest pain, unspecified: Secondary | ICD-10-CM | POA: Diagnosis not present

## 2019-03-21 DIAGNOSIS — R0602 Shortness of breath: Secondary | ICD-10-CM | POA: Diagnosis not present

## 2019-03-21 DIAGNOSIS — G459 Transient cerebral ischemic attack, unspecified: Secondary | ICD-10-CM

## 2019-03-21 DIAGNOSIS — I2510 Atherosclerotic heart disease of native coronary artery without angina pectoris: Secondary | ICD-10-CM | POA: Diagnosis not present

## 2019-03-21 MED ORDER — NITROGLYCERIN 0.4 MG SL SUBL
SUBLINGUAL_TABLET | SUBLINGUAL | Status: AC
  Filled 2019-03-21: qty 2

## 2019-03-21 MED ORDER — IOHEXOL 350 MG/ML SOLN
100.0000 mL | Freq: Once | INTRAVENOUS | Status: AC | PRN
  Administered 2019-03-21: 100 mL via INTRAVENOUS

## 2019-03-21 MED ORDER — NITROGLYCERIN 0.4 MG SL SUBL
0.8000 mg | SUBLINGUAL_TABLET | Freq: Once | SUBLINGUAL | Status: AC
  Administered 2019-03-21: 0.8 mg via SUBLINGUAL

## 2019-03-21 NOTE — Telephone Encounter (Signed)
New Message  Patient called to reschedule MRI  Please call back

## 2019-03-21 NOTE — Telephone Encounter (Signed)
reroute

## 2019-03-21 NOTE — Progress Notes (Signed)
Taken to MRI for exam of brain. IV left in case MRI needs to use it, tech [place] aware.

## 2019-04-02 ENCOUNTER — Other Ambulatory Visit: Payer: Self-pay

## 2019-04-02 ENCOUNTER — Ambulatory Visit (HOSPITAL_COMMUNITY): Admission: RE | Admit: 2019-04-02 | Payer: Medicare HMO | Source: Ambulatory Visit

## 2019-04-02 ENCOUNTER — Ambulatory Visit (HOSPITAL_COMMUNITY)
Admission: RE | Admit: 2019-04-02 | Discharge: 2019-04-02 | Disposition: A | Discharge status: Home / Self Care | Payer: Medicare HMO | Source: Ambulatory Visit | Attending: Cardiovascular Disease | Admitting: Cardiovascular Disease

## 2019-04-02 DIAGNOSIS — R531 Weakness: Secondary | ICD-10-CM | POA: Insufficient documentation

## 2019-04-02 DIAGNOSIS — G459 Transient cerebral ischemic attack, unspecified: Secondary | ICD-10-CM | POA: Diagnosis not present

## 2019-04-02 DIAGNOSIS — G3189 Other specified degenerative diseases of nervous system: Secondary | ICD-10-CM | POA: Diagnosis not present

## 2019-04-02 DIAGNOSIS — I6602 Occlusion and stenosis of left middle cerebral artery: Secondary | ICD-10-CM | POA: Diagnosis not present

## 2019-04-02 DIAGNOSIS — I6611 Occlusion and stenosis of right anterior cerebral artery: Secondary | ICD-10-CM | POA: Diagnosis not present

## 2019-04-02 DIAGNOSIS — I6521 Occlusion and stenosis of right carotid artery: Secondary | ICD-10-CM | POA: Diagnosis not present

## 2019-04-07 ENCOUNTER — Telehealth: Payer: Self-pay

## 2019-04-07 DIAGNOSIS — I679 Cerebrovascular disease, unspecified: Secondary | ICD-10-CM

## 2019-04-07 MED ORDER — CLOPIDOGREL BISULFATE 75 MG PO TABS: 75.0000 mg | ORAL_TABLET | Freq: Every day | ORAL | 3 refills | Status: DC

## 2019-04-07 NOTE — Telephone Encounter (Signed)
Received a call in triage from Opal Sidles at [facility] regarding the patients recent MRI on 11/25. Results can be found in Results Review. Will route to MD for review.

## 2019-04-07 NOTE — Addendum Note (Signed)
Addended by: Aris Georgia, Agnieszka Newhouse L on: 04/07/2019 11:50 AM   Modules accepted: Orders

## 2019-04-07 NOTE — Telephone Encounter (Signed)
Called patient back about his MRI results. Per Dr. Johnsie Cancel, Needs neurology f/u sooner rather than latter start plavix 75 mg daily has intracranial vascular disease no evidence of previous stroke.  Will put in referral for neurology. Will send message to scheduling to help with referral. Will send medication to patient's pharmacy of choice. Patient verbalized understanding

## 2019-04-16 ENCOUNTER — Other Ambulatory Visit: Payer: Self-pay | Admitting: Gastroenterology

## 2019-04-17 ENCOUNTER — Telehealth: Payer: Self-pay | Admitting: Cardiovascular Disease

## 2019-04-17 ENCOUNTER — Other Ambulatory Visit (HOSPITAL_COMMUNITY): Payer: Self-pay | Admitting: Interventional Radiology

## 2019-04-17 DIAGNOSIS — I679 Cerebrovascular disease, unspecified: Secondary | ICD-10-CM

## 2019-04-17 DIAGNOSIS — G459 Transient cerebral ischemic attack, unspecified: Secondary | ICD-10-CM

## 2019-04-17 NOTE — Telephone Encounter (Signed)
Put in referral to see interventional radiology, Dr. Estanislado Pandy. Neurology referral was canceled, due to diagnosis and they do not treat intracranial vascular disease. Called patient to let him know of change.

## 2019-04-17 NOTE — Telephone Encounter (Signed)
Follow Up:     Pt said he thought Dr Johnsie Cancel had wanted him to see a Neurologist.

## 2019-04-18 ENCOUNTER — Other Ambulatory Visit: Payer: Self-pay | Admitting: Physician Assistant

## 2019-04-19 ENCOUNTER — Other Ambulatory Visit: Payer: Self-pay | Admitting: Radiology

## 2019-04-21 ENCOUNTER — Encounter (HOSPITAL_COMMUNITY): Payer: Self-pay

## 2019-04-21 ENCOUNTER — Ambulatory Visit (HOSPITAL_COMMUNITY)
Admission: RE | Admit: 2019-04-21 | Discharge: 2019-04-21 | Disposition: A | Discharge status: Home / Self Care | Payer: Medicare HMO | Source: Ambulatory Visit | Attending: Interventional Radiology | Admitting: Interventional Radiology

## 2019-04-21 ENCOUNTER — Other Ambulatory Visit: Payer: Self-pay

## 2019-04-21 ENCOUNTER — Other Ambulatory Visit (HOSPITAL_COMMUNITY): Payer: Self-pay | Admitting: Interventional Radiology

## 2019-04-21 DIAGNOSIS — I110 Hypertensive heart disease with heart failure: Secondary | ICD-10-CM | POA: Diagnosis not present

## 2019-04-21 DIAGNOSIS — B20 Human immunodeficiency virus [HIV] disease: Secondary | ICD-10-CM | POA: Insufficient documentation

## 2019-04-21 DIAGNOSIS — I509 Heart failure, unspecified: Secondary | ICD-10-CM | POA: Insufficient documentation

## 2019-04-21 DIAGNOSIS — Z7982 Long term (current) use of aspirin: Secondary | ICD-10-CM | POA: Diagnosis not present

## 2019-04-21 DIAGNOSIS — K219 Gastro-esophageal reflux disease without esophagitis: Secondary | ICD-10-CM | POA: Insufficient documentation

## 2019-04-21 DIAGNOSIS — Z7902 Long term (current) use of antithrombotics/antiplatelets: Secondary | ICD-10-CM | POA: Diagnosis not present

## 2019-04-21 DIAGNOSIS — Z79899 Other long term (current) drug therapy: Secondary | ICD-10-CM | POA: Diagnosis not present

## 2019-04-21 DIAGNOSIS — Z87891 Personal history of nicotine dependence: Secondary | ICD-10-CM | POA: Diagnosis not present

## 2019-04-21 DIAGNOSIS — G459 Transient cerebral ischemic attack, unspecified: Secondary | ICD-10-CM | POA: Diagnosis not present

## 2019-04-21 DIAGNOSIS — R531 Weakness: Secondary | ICD-10-CM | POA: Diagnosis not present

## 2019-04-21 DIAGNOSIS — I6521 Occlusion and stenosis of right carotid artery: Secondary | ICD-10-CM | POA: Diagnosis not present

## 2019-04-21 HISTORY — PX: IR ANGIO INTRA EXTRACRAN SEL COM CAROTID INNOMINATE BILAT MOD SED: IMG5360

## 2019-04-21 HISTORY — PX: IR ANGIO VERTEBRAL SEL VERTEBRAL BILAT MOD SED: IMG5369

## 2019-04-21 LAB — CBC
HCT: 41.1 % (ref 39.0–52.0)
Hemoglobin: 13.9 g/dL (ref 13.0–17.0)
MCH: 35 pg — ABNORMAL HIGH (ref 26.0–34.0)
MCHC: 33.8 g/dL (ref 30.0–36.0)
MCV: 103.5 fL — ABNORMAL HIGH (ref 80.0–100.0)
Platelets: 208 10*3/uL (ref 150–400)
RBC: 3.97 MIL/uL — ABNORMAL LOW (ref 4.22–5.81)
RDW: 13.2 % (ref 11.5–15.5)
WBC: 6.4 10*3/uL (ref 4.0–10.5)
nRBC: 0 % (ref 0.0–0.2)

## 2019-04-21 LAB — BASIC METABOLIC PANEL
Anion gap: 9 (ref 5–15)
BUN: 21 mg/dL (ref 8–23)
CO2: 24 mmol/L (ref 22–32)
Calcium: 9.3 mg/dL (ref 8.9–10.3)
Chloride: 109 mmol/L (ref 98–111)
Creatinine, Ser: 1.56 mg/dL — ABNORMAL HIGH (ref 0.61–1.24)
GFR calc Af Amer: 51 mL/min — ABNORMAL LOW (ref >60)
GFR calc non Af Amer: 44 mL/min — ABNORMAL LOW (ref >60)
Glucose, Bld: 107 mg/dL — ABNORMAL HIGH (ref 70–99)
Potassium: 3.5 mmol/L (ref 3.5–5.1)
Sodium: 142 mmol/L (ref 135–145)

## 2019-04-21 LAB — PROTIME-INR
INR: 0.9 (ref 0.8–1.2)
Prothrombin Time: 12.1 seconds (ref 11.4–15.2)

## 2019-04-21 MED ORDER — HEPARIN SODIUM (PORCINE) 1000 UNIT/ML IJ SOLN
INTRAMUSCULAR | Status: AC
  Filled 2019-04-21: qty 1

## 2019-04-21 MED ORDER — HYDRALAZINE HCL 20 MG/ML IJ SOLN
INTRAMUSCULAR | Status: AC | PRN
  Administered 2019-04-21: 5 mg via INTRAVENOUS

## 2019-04-21 MED ORDER — FENTANYL CITRATE (PF) 100 MCG/2ML IJ SOLN
INTRAMUSCULAR | Status: AC | PRN
  Administered 2019-04-21: 25 ug via INTRAVENOUS

## 2019-04-21 MED ORDER — FENTANYL CITRATE (PF) 100 MCG/2ML IJ SOLN
INTRAMUSCULAR | Status: AC
  Filled 2019-04-21: qty 2

## 2019-04-21 MED ORDER — MIDAZOLAM HCL 2 MG/2ML IJ SOLN
INTRAMUSCULAR | Status: AC
  Filled 2019-04-21: qty 2

## 2019-04-21 MED ORDER — VERAPAMIL HCL 2.5 MG/ML IV SOLN
INTRAVENOUS | Status: AC
  Filled 2019-04-21: qty 2

## 2019-04-21 MED ORDER — HEPARIN SODIUM (PORCINE) 1000 UNIT/ML IJ SOLN
INTRAMUSCULAR | Status: AC
  Filled 2019-04-21: qty 2

## 2019-04-21 MED ORDER — SODIUM CHLORIDE 0.9 % IV SOLN
INTRAVENOUS | Status: AC | PRN
  Administered 2019-04-21: 250 mL via INTRAVENOUS

## 2019-04-21 MED ORDER — HYDRALAZINE HCL 20 MG/ML IJ SOLN
INTRAMUSCULAR | Status: AC
  Filled 2019-04-21: qty 1

## 2019-04-21 MED ORDER — SODIUM CHLORIDE 0.9 % IV SOLN: INTRAVENOUS | Status: AC

## 2019-04-21 MED ORDER — LIDOCAINE HCL 1 % IJ SOLN
INTRAMUSCULAR | Status: AC | PRN
  Administered 2019-04-21: 5 mL

## 2019-04-21 MED ORDER — SODIUM CHLORIDE 0.9 % IV SOLN: INTRAVENOUS | Status: DC

## 2019-04-21 MED ORDER — NITROGLYCERIN 1 MG/10 ML FOR IR/CATH LAB
INTRA_ARTERIAL | Status: AC
  Filled 2019-04-21: qty 10

## 2019-04-21 MED ORDER — IOHEXOL 300 MG/ML  SOLN
150.0000 mL | Freq: Once | INTRAMUSCULAR | Status: AC | PRN
  Administered 2019-04-21: 75 mL via INTRA_ARTERIAL

## 2019-04-21 MED ORDER — MIDAZOLAM HCL 2 MG/2ML IJ SOLN
INTRAMUSCULAR | Status: AC | PRN
  Administered 2019-04-21: 1 mg via INTRAVENOUS

## 2019-04-21 MED ORDER — IOHEXOL 300 MG/ML  SOLN
50.0000 mL | Freq: Once | INTRAMUSCULAR | Status: AC | PRN
  Administered 2019-04-21: 13:00:00 12 mL via INTRA_ARTERIAL

## 2019-04-21 MED ORDER — LIDOCAINE HCL 1 % IJ SOLN
INTRAMUSCULAR | Status: AC
  Filled 2019-04-21: qty 20

## 2019-04-21 MED ORDER — HEPARIN SODIUM (PORCINE) 1000 UNIT/ML IJ SOLN
INTRAMUSCULAR | Status: AC | PRN
  Administered 2019-04-21: 1000 [IU] via INTRAVENOUS

## 2019-04-21 NOTE — Sedation Documentation (Signed)
ETC02 removed per Dr. Estanislado Pandy

## 2019-04-21 NOTE — Procedures (Signed)
S/P 4 vessel cerebra arteriogram. RT CFA approach./ Findings. 1.severe RT ICA supraclinoisd stenosis. 2.Approx 50 % stenosis of RT MCA inf division at its origin. S.Dinara Lupu MD

## 2019-04-21 NOTE — H&P (Signed)
Chief Complaint: Patient was seen in consultation today for a diagnostic cerebral angiogram.   Referring Physician(s): Dr. Johnsie Cancel Dr. Tarry Kos  Supervising Physician: Luanne Bras  Patient Status: Jeffrey [name]  History of Present Illness: Jeffrey [name] is a 73 y.o. male with a past medical history significant for GERD, HIV (followed by ID), HTN, CHF and TIA who presents today for a diagnostic cerebral angiogram. Jeffrey [name] reported to his PC in July of this year that he had been having issues with balance and intermittent vertigo for approximately 2 weeks. He noted that when he turned his head that his symptoms would start out of the blue. It was felt that he had peripheral vertigo and was sent for further evaluation. He was sent to PT where it was noted he that he became short of breath with minimal exertion and he reported to the physical therapist that he had been experiencing chest pain with exertion since the beginning of this year. He reported that these symptoms did not feel the same as his previous CHF symptoms. He was seen again by his PCP for these complaints and underwent an EKG at that visit which showed no abnormalities, as such he was referred to cardiology for further evaluation. He was seen by cardiology on 10/22 and reported the same symptoms as above as well as paresthesias in his right arm and difficulty walking straight in April of this year which he did not have evaluated. Due to this history he was sent for MRI/MRA head/brain on 11/25 which noted no remote cortical infarct, however there were multiple areas of stenoses within the paraclinoid/supraclinoid right internal carotid artery, moderate/severe focal stenosis within the proximal right M2 MCA inferior division, apparent high-grade stenosis within the proximal aspect of a hypoplastic A1 right anterior cerebral artery and two moderate/severe focal stenoses within the P2 left posterior cerebral artery. NIR has been  consulted for cerebral angiogram to further assess imaging findings.  Jeffrey [name] reports that he is very healthy and typically has no issues with ambulation however he has noticed over the last month or more that he "lists to the right when walking" and has some issue with balance, however he denies any falls. He does not use a walker or cane at home and performs all ADLs independently. He experiences frequent right sided headaches that are stabbing or stinging in nature and are not typically helped by OTC medications, but are sometimes helped with restHe also experiences right sided neck pain at times, sometimes with headache but not always. However, he reports that he was placed on Plavix several days before Thanksgiving and has no had a headache or neck pain since. He states understanding of requested procedure and wishes to proceed.   Past Medical History:  Diagnosis Date  . Colon polyps   . GERD (gastroesophageal reflux disease)   . HIV (human immunodeficiency virus infection) ([place]) 1999    Past Surgical History:  Procedure Laterality Date  . CHOLECYSTECTOMY  1982  . ESOPHAGEAL MANOMETRY N/A 09/03/2017   Procedure: ESOPHAGEAL MANOMETRY (EM);  Surgeon: Mauri Pole, MD;  Location: [facility];  Service: Endoscopy;  Laterality: N/A;  . KNEE SURGERY     LEFT KNEE  . TONSILLECTOMY      Allergies: Baclofen, Efavirenz, and Nevirapine  Medications: Prior to Admission medications   Medication Sig Start Date End Date Taking? Authorizing Provider  aspirin EC 81 MG tablet Take 1 tablet (81 mg total) by mouth daily. 10/11/15  Yes Nobie Putnam  J, DO  clopidogrel (PLAVIX) 75 MG tablet Take 1 tablet (75 mg total) by mouth daily. 04/07/19  Yes Josue Hector, MD  finasteride (PROSCAR) 5 MG tablet Take 5 mg by mouth daily.   Yes [provider]  Multiple Vitamins-Minerals (MULTIVITAMIN PO) Take 1 tablet by mouth daily.   Yes [provider]  pantoprazole (PROTONIX)  40 MG tablet Please keep December appt for further refills. 04/16/19  Yes Armbruster, Carlota Raspberry, MD  TRIUMEQ 600-50-300 MG tablet Take 1 tablet by mouth once daily 03/10/19  Yes [name], MD  metoprolol tartrate (LOPRESSOR) 50 MG tablet Take 1 tablet by mouth the night before CT and take 1 tablet the morning of the CT 02/27/19   Josue Hector, MD  nitroGLYCERIN (NITROSTAT) 0.4 MG SL tablet Place 1 tablet (0.4 mg total) under the tongue every 5 (five) minutes as needed for chest pain. 01/21/19   Stark Klein, MD     Family History  Problem Relation Age of Onset  . Hypertension Mother   . Heart attack Mother 60  . Colon cancer Neg Hx   . Esophageal cancer Neg Hx   . Pancreatic cancer Neg Hx   . Stomach cancer Neg Hx   . Liver disease Neg Hx     Social History   Socioeconomic History  . Marital status: Divorced    Spouse name: Not on file  . Number of children: Not on file  . Years of education: Not on file  . Highest education level: Not on file  Occupational History  . Occupation: retired    Fish farm manager: [employer]  Tobacco Use  . Smoking status: Never Smoker  . Smokeless tobacco: Former Systems developer    Types: Chew  . Tobacco comment: Rare use, about q 6 months  Substance and Sexual Activity  . Alcohol use: Yes    Comment: rare  . Drug use: No  . Sexual activity: Yes    Partners: Female    Comment: declined condoms  Other Topics Concern  . Not on file  Social History Narrative  . Not on file   Social Determinants of Health   Financial Resource Strain:   . Difficulty of Paying Living Expenses: Not on file  Food Insecurity:   . Worried About Charity fundraiser in the Last Year: Not on file  . Ran Out of Food in the Last Year: Not on file  Transportation Needs:   . Lack of Transportation (Medical): Not on file  . Lack of Transportation (Non-Medical): Not on file  Physical Activity:   . Days of Exercise per Week: Not on file  . Minutes of Exercise per Session: Not on  file  Stress:   . Feeling of Stress : Not on file  Social Connections:   . Frequency of Communication with Friends and Family: Not on file  . Frequency of Social Gatherings with Friends and Family: Not on file  . Attends Religious Services: Not on file  . Active Member of Clubs or Organizations: Not on file  . Attends Archivist Meetings: Not on file  . Marital Status: Not on file     Review of Systems: A 12 point ROS discussed and pertinent positives are indicated in the HPI above.  All other systems are negative.  Review of Systems  Constitutional: Negative for appetite change, chills, fatigue and fever.  HENT: Negative for nosebleeds, tinnitus, trouble swallowing and voice change.   Eyes: Negative for photophobia and visual  disturbance.  Respiratory: Positive for shortness of breath (with exertion). Negative for cough.   Cardiovascular: Negative for chest pain.  Gastrointestinal: Negative for abdominal pain, blood in stool, diarrhea, nausea and vomiting.  Genitourinary: Negative for hematuria.  Musculoskeletal: Positive for gait problem ("listing to the right"). Negative for back pain.  Skin: Negative for rash and wound.  Neurological: Positive for headaches (none since starting Plavix). Negative for dizziness, tremors, seizures, syncope, facial asymmetry, speech difficulty, weakness, light-headedness and numbness.  Psychiatric/Behavioral: Negative for confusion.    Vital Signs: BP (!) 158/98   Pulse 71   Temp 98 F (36.7 C) (Oral)   Resp 16   Ht 5' 10"  (1.778 m)   Wt 170 lb (77.1 kg)   SpO2 97%   BMI 24.39 kg/m   Physical Exam Vitals reviewed.  Constitutional:      General: He is not in acute distress. HENT:     Head: Normocephalic.     Mouth/Throat:     Mouth: Mucous membranes are moist.     Pharynx: Oropharynx is clear. No oropharyngeal exudate or posterior oropharyngeal erythema.  Cardiovascular:     Rate and Rhythm: Normal rate and regular rhythm.       Pulses: Normal pulses.  Pulmonary:     Effort: Pulmonary effort is normal.     Breath sounds: Normal breath sounds.  Abdominal:     General: Bowel sounds are normal. There is no distension.     Palpations: Abdomen is soft.     Tenderness: There is no abdominal tenderness.  Skin:    General: Skin is warm and dry.  Neurological:     Mental Status: He is alert and oriented to person, place, and time.  Psychiatric:        Mood and Affect: Mood normal.        Behavior: Behavior normal.        Thought Content: Thought content normal.        Judgment: Judgment normal.      MD Evaluation Airway: WNL Heart: WNL Abdomen: WNL Chest/ Lungs: WNL ASA  Classification: 2 Mallampati/Airway Score: Two   Imaging: MR ANGIO HEAD WO CONTRAST  Result Date: 04/02/2019 CLINICAL DATA:  TIA, initial exam. Additional history provided: Patient had TIA or possible stroke in April which resulted in transient right-sided weakness. EXAM: MRI HEAD WITHOUT CONTRAST MRA HEAD WITHOUT CONTRAST TECHNIQUE: Multiplanar, multiecho pulse sequences of the brain and surrounding structures were obtained without intravenous contrast. Angiographic images of the head were obtained using MRA technique without contrast. COMPARISON:  No pertinent prior studies available for comparison. FINDINGS: MRI HEAD FINDINGS Brain: There is no evidence of acute infarct. No evidence of intracranial mass. No midline shift or extra-axial fluid collection. No chronic intracranial blood products. Mild scattered T2/FLAIR hyperintensity within the cerebral white matter is nonspecific, but consistent with chronic small vessel ischemic disease. Prominent perivascular space within the inferior left basal ganglia. Mild generalized parenchymal atrophy. Vascular: Reported separately Skull and upper cervical spine: No focal marrow lesion Sinuses/Orbits: Visualized orbits demonstrate no acute abnormality. Mild paranasal sinus mucosal thickening. No  significant mastoid effusion. MRA HEAD FINDINGS The intracranial internal carotid arteries are patent bilaterally. Moderate/severe focal stenosis within the paraclinoid/supraclinoid right internal carotid artery (series 401, image 5) (series 4, images 58-60). No significant stenosis within the intracranial left ICA. The M1 right middle cerebral artery is patent. Moderate/severe focal stenosis within the proximal M2 right MCA inferior division shortly beyond the bifurcation (series 402, image 3). Apparent  high-grade stenosis within the proximal aspect of a hypoplastic A1 right anterior cerebral artery. The M1 left middle cerebral artery is patent without significant stenosis. Mild/moderate focal stenosis within a proximal left M2 branch (series 401, image 4). The A1 left anterior cerebral artery is dominant. No significant proximal A2 stenosis in either anterior cerebral artery. No intracranial aneurysm is identified. The intracranial vertebral arteries are patent without significant proximal stenosis, as is the basilar artery. The bilateral posterior cerebral arteries are patent. Two moderate/severe focal stenoses within the P2 left posterior cerebral artery, one proximally and one distally. MRA impression 2, 3 and 5 below will be called to the ordering clinician or representative by the Radiologist Assistant, and communication documented in the PACS or zVision Dashboard. IMPRESSION: MRI brain: 1. No evidence of acute intracranial abnormality. 2. No remote cortical infarct is identified. 3. Mild generalized parenchymal atrophy and chronic small vessel ischemic disease. MRA head: 1. Intracranial atherosclerotic disease with multifocal stenoses, most notably as follows. 2. Moderate/severe focal stenosis within the paraclinoid/supraclinoid right internal carotid artery. 3. Moderate/severe focal stenosis within the proximal right M2 MCA inferior division. 4. Apparent high-grade stenosis within the proximal aspect of a  hypoplastic A1 right anterior cerebral artery. 5. Two moderate/severe focal stenoses within the P2 left posterior cerebral artery, one proximal and one distal. Electronically Signed   By: Kellie Simmering DO   On: 04/02/2019 21:38   MR BRAIN WO CONTRAST  Result Date: 04/02/2019 CLINICAL DATA:  TIA, initial exam. Additional history provided: Patient had TIA or possible stroke in April which resulted in transient right-sided weakness. EXAM: MRI HEAD WITHOUT CONTRAST MRA HEAD WITHOUT CONTRAST TECHNIQUE: Multiplanar, multiecho pulse sequences of the brain and surrounding structures were obtained without intravenous contrast. Angiographic images of the head were obtained using MRA technique without contrast. COMPARISON:  No pertinent prior studies available for comparison. FINDINGS: MRI HEAD FINDINGS Brain: There is no evidence of acute infarct. No evidence of intracranial mass. No midline shift or extra-axial fluid collection. No chronic intracranial blood products. Mild scattered T2/FLAIR hyperintensity within the cerebral white matter is nonspecific, but consistent with chronic small vessel ischemic disease. Prominent perivascular space within the inferior left basal ganglia. Mild generalized parenchymal atrophy. Vascular: Reported separately Skull and upper cervical spine: No focal marrow lesion Sinuses/Orbits: Visualized orbits demonstrate no acute abnormality. Mild paranasal sinus mucosal thickening. No significant mastoid effusion. MRA HEAD FINDINGS The intracranial internal carotid arteries are patent bilaterally. Moderate/severe focal stenosis within the paraclinoid/supraclinoid right internal carotid artery (series 401, image 5) (series 4, images 58-60). No significant stenosis within the intracranial left ICA. The M1 right middle cerebral artery is patent. Moderate/severe focal stenosis within the proximal M2 right MCA inferior division shortly beyond the bifurcation (series 402, image 3). Apparent high-grade  stenosis within the proximal aspect of a hypoplastic A1 right anterior cerebral artery. The M1 left middle cerebral artery is patent without significant stenosis. Mild/moderate focal stenosis within a proximal left M2 branch (series 401, image 4). The A1 left anterior cerebral artery is dominant. No significant proximal A2 stenosis in either anterior cerebral artery. No intracranial aneurysm is identified. The intracranial vertebral arteries are patent without significant proximal stenosis, as is the basilar artery. The bilateral posterior cerebral arteries are patent. Two moderate/severe focal stenoses within the P2 left posterior cerebral artery, one proximally and one distally. MRA impression 2, 3 and 5 below will be called to the ordering clinician or representative by the Radiologist Assistant, and communication documented in the PACS or  zVision Dashboard. IMPRESSION: MRI brain: 1. No evidence of acute intracranial abnormality. 2. No remote cortical infarct is identified. 3. Mild generalized parenchymal atrophy and chronic small vessel ischemic disease. MRA head: 1. Intracranial atherosclerotic disease with multifocal stenoses, most notably as follows. 2. Moderate/severe focal stenosis within the paraclinoid/supraclinoid right internal carotid artery. 3. Moderate/severe focal stenosis within the proximal right M2 MCA inferior division. 4. Apparent high-grade stenosis within the proximal aspect of a hypoplastic A1 right anterior cerebral artery. 5. Two moderate/severe focal stenoses within the P2 left posterior cerebral artery, one proximal and one distal. Electronically Signed   By: Kellie Simmering DO   On: 04/02/2019 21:38    Labs:  CBC: Recent Labs    07/25/18 0836 02/10/19 0911  WBC 7.5 5.9  HGB 13.5 13.7  HCT 38.3* 39.8  PLT 209 187    COAGS: No results for input(s): INR, APTT in the last 8760 hours.  BMP: Recent Labs    07/25/18 0836 11/26/18 1638 02/10/19 0911 02/27/19 1638  NA 140  140 141 141  K 4.2 4.8 4.3 4.3  CL 106 105 108 106  CO2 27 23 28 23   GLUCOSE 112* 99 76 102*  BUN 25 27 31* 27  CALCIUM 9.6 9.4 9.2 9.2  CREATININE 1.81* 1.47* 1.53* 1.48*  GFRNONAA  --  48*  --  47*  GFRAA  --  55*  --  54*    LIVER FUNCTION TESTS: Recent Labs    07/25/18 0836 11/26/18 1638 02/10/19 0911  BILITOT 0.4 0.3 0.4  AST 24 18 17   ALT 23 16 18   ALKPHOS  --  77  --   PROT 6.6 6.6 6.3  ALBUMIN  --  4.3  --     TUMOR MARKERS: No results for input(s): AFPTM, CEA, CA199, CHROMGRNA in the last 8760 hours.  Assessment and Plan:  73 y/o M with possible TIA in April of this year based on symptoms reported to cardiologist who underwent MRI/MRA head/brain on 11/25 which noted no remote cortical infarct, however there were multiple areas of stenoses within the paraclinoid/supraclinoid right internal carotid artery, moderate/severe focal stenosis within the proximal right M2 MCA inferior division, apparent high-grade stenosis within the proximal aspect of a hypoplastic A1 right anterior cerebral artery and two moderate/severe focal stenoses within the P2 left posterior cerebral artery. NIR has been consulted for cerebral angiogram to further assess imaging findings.  Patient has been NPO since midnight except for a small sip of water with his morning medications, he continues to take Plavix 75 mg QD + ASA 81 mg QD. Afebrile, WBC 6.4, hgb 13.9, plt 208, creatinine 1.56 (baseline for patient per chart review), INR 0.9.  Risks and benefits of diagnostic cerebral angiogram were discussed with the patient including, but not limited to bleeding, infection, vascular injury, stroke, or contrast induced renal failure.  This interventional procedure involves the use of X-rays and because of the nature of the planned procedure, it is possible that we will have prolonged use of X-ray fluoroscopy. Potential radiation risks to you include (but are not limited to) the following: - A slightly  elevated risk for cancer  several years later in life. This risk is typically less than 0.5% percent. This risk is low in comparison to the normal incidence of human cancer, which is 33% for women and 50% for men according to the [employer]. - Radiation induced injury can include skin redness, resembling a rash, tissue breakdown / ulcers and hair  loss (which can be temporary or permanent).   The likelihood of either of these occurring depends on the difficulty of the procedure and whether you are sensitive to radiation due to previous procedures, disease, or genetic conditions.  IF your procedure requires a prolonged use of radiation, you will be notified and given written instructions for further action.  It is your responsibility to monitor the irradiated area for the 2 weeks following the procedure and to notify your physician if you are concerned that you have suffered a radiation induced injury.    All of the patient's questions were answered, patient is agreeable to proceed.  Consent signed and in chart.  Thank you for this interesting consult.  I greatly enjoyed meeting Jeffrey [name] and look forward to participating in their care.  A copy of this report was sent to the requesting provider on this date.  Electronically Signed: Joaquim Nam, PA-C 04/21/2019, 7:47 AM   I spent a total of  30 Minutes   in face to face in clinical consultation, greater than 50% of which was counseling/coordinating care for diagnostic cerebral angiogram.

## 2019-04-21 NOTE — Discharge Instructions (Addendum)
Femoral Site Care This sheet gives you information about how to care for yourself after your procedure. Your health care provider may also give you more specific instructions. If you have problems or questions, contact your health care provider. What can I expect after the procedure? After the procedure, it is common to have:  Bruising that usually fades within 1-2 weeks.  Tenderness at the site. Follow these instructions at home: Wound care  Follow instructions from your health care provider about how to take care of your insertion site. Make sure you: ? Wash your hands with soap and water before you change your bandage (dressing). If soap and water are not available, use hand sanitizer. ? Change your dressing as told by your health care provider. ? Leave stitches (sutures), skin glue, or adhesive strips in place. These skin closures may need to stay in place for 2 weeks or longer. If adhesive strip edges start to loosen and curl up, you may trim the loose edges. Do not remove adhesive strips completely unless your health care provider tells you to do that.  Do not take baths, swim, or use a hot tub until your health care provider approves.  You may shower 24-48 hours after the procedure or as told by your health care provider. ? Gently wash the site with plain soap and water. ? Pat the area dry with a clean towel. ? Do not rub the site. This may cause bleeding.  Do not apply powder or lotion to the site. Keep the site clean and dry.  Check your femoral site every day for signs of infection. Check for: ? Redness, swelling, or pain. ? Fluid or blood. ? Warmth. ? Pus or a bad smell. Activity  For the first 2-3 days after your procedure, or as long as directed: ? Avoid climbing stairs as much as possible. ? Do not squat.  Do not lift anything that is heavier than 10 lb (4.5 kg), or the limit that you are told, until your health care provider says that it is safe.  Rest as  directed. ? Avoid sitting for a long time without moving. Get up to take short walks every 1-2 hours.  Do not drive for 24 hours if you were given a medicine to help you relax (sedative). General instructions  Take over-the-counter and prescription medicines only as told by your health care provider.  Keep all follow-up visits as told by your health care provider. This is important. Contact a health care provider if you have:  A fever or chills.  You have redness, swelling, or pain around your insertion site. Get help right away if:  The catheter insertion area swells very fast.  You pass out.  You suddenly start to sweat or your skin gets clammy.  The catheter insertion area is bleeding, and the bleeding does not stop when you hold steady pressure on the area.  The area near or just beyond the catheter insertion site becomes pale, cool, tingly, or numb. These symptoms may represent a serious problem that is an emergency. Do not wait to see if the symptoms will go away. Get medical help right away. Call your local emergency services (911 in the [place] Do not drive yourself to the hospital. Summary  After the procedure, it is common to have bruising that usually fades within 1-2 weeks.  Check your femoral site every day for signs of infection.  Do not lift anything that is heavier than 10 lb (4.5 kg), or  the limit that you are told, until your health care provider says that it is safe. This information is not intended to replace advice given to you by your health care provider. Make sure you discuss any questions you have with your health care provider. Document Released: 12/26/2013 Document Revised: 05/07/2017 Document Reviewed: 05/07/2017 Elsevier Patient Education  2020 [place].   Cerebral Angiogram  A cerebral angiogram is a procedure that is used to examine the blood vessels in the brain and neck. In this procedure, contrast dye is injected through a long, thin tube  (catheter) into an artery. X-rays are then taken, which show if there is a blockage or problem in a blood vessel. Tell a health care provider about:  Any allergies you have, including allergies to shellfish, contrast dye, or iodine  All medicines you are taking, including vitamins, herbs, eye drops, creams, and over-the-counter medicines.  Any problems you or family members have had with anesthetic medicines.  Any blood disorders you have.  Any surgeries you have had.  Any medical conditions you have.  Whether you are pregnant or may be pregnant.  Whether you are currently breastfeeding. What are the risks? Generally, this is a safe procedure. However, problems may occur, including:  Damage to surrounding nerves, tissues, or structures.  Blood clot.  Inability to remember what happened (amnesia). This is usually temporary.  Weakness, numbness, speech, or vision problems. This is usually temporary.  Stroke.  Kidney injury.  Bleeding or bruising.  Allergic reaction medicines or dyes.  Infection. What happens before the procedure? Staying hydrated Follow instructions from your health care provider about hydration, which may include:  Up to 2 hours before the procedure - you may continue to drink clear liquids, such as water, clear fruit juice, black coffee, and plain tea. Eating and drinking restrictions Follow instructions from your health care provider about eating and drinking, which may include:  8 hours before the procedure - stop eating heavy meals or foods such as meat, fried foods, or fatty foods.  6 hours before the procedure - stop eating light meals or foods, such as toast or cereal.  6 hours before the procedure - stop drinking milk or drinks that contain milk.  2 hours before the procedure - stop drinking clear liquids. General instructions  Ask your health care provider about: ? Changing or stopping your regular medicines. This is especially  important if you are taking diabetes medicines or blood thinners. ? Taking medicines such as aspirin or ibuprofen. These medicines can thin your blood. Do not take these medicines before your procedure if your health care provider asks you not to.  You may have blood tests done.  Plan to have someone take you home from the hospital or clinic.  If you will be going home right after the procedure, plan to have someone with you for 24 hours. What happens during the procedure?  To reduce your risk of infection: ? Your health care team will wash or sanitize their hands. ? Your skin will be washed with soap. ? Hair may be removed from the surgical area.  You will lie on your back on an imaging bed with an X-ray machine around you.  Your head will be secured to the bed with a strap or device to help you keep still.  An IV tube will be inserted into one of your veins.  You will be given one or more of the following: ? A medicine to help you relax (sedative). ?  A medicine to numb the area (local anesthetic) where the catheter will be inserted. This is usually your groin, leg, or arm.  Your heart rate and other vital signs will be watched carefully. You may have electrodes placed on your chest.  A small cut (incision) will be made where the catheter will be inserted.  The catheter will be inserted into an artery that leads to the head. You may feel slight pressure.  The catheter will be moved through the body up to your neck and brain. X-ray images will help your health care provider bring the catheter to the correct location.  The dye will be injected into the catheter and will travel to your head or neck area. You may feel a warming or burning sensation or notice a strange taste in your mouth as the dye goes through your system.  Images will be taken to show how the dye flows through the area.  While the images are being taken, you may be given instructions on breathing, swallowing,  moving, or talking.  When the images are finished, the catheter will be slowly removed.  Pressure will be applied to the skin to stop any bleeding. A tight bandage (dressing) or seal will be applied to the skin.  Your IV will be removed. The procedure may vary among health care providers and hospitals. What happens after the procedure?  Your blood pressure, heart rate, breathing rate, and blood oxygen level will be monitored until the medicines you were given have worn off.  You will be asked to lie flat for several hours. The arm or leg where the catheter was inserted will need to be kept straight while you are in the recovery room.  The insertion site will be watched for bleeding and you will be checked often.  You will be instructed to drink plenty of fluids. This will help wash the contrast dye out of your system.  Do not drive for 24 hours if you received a sedative.  It is up to you to get the results of your procedure. Ask your health care provider, or the department that is doing the procedure, when your results will be ready. Summary  A cerebral angiogram is a procedure that is used to examine the blood vessels in the brain and neck.  In this procedure, contrast dye is injected through a long, thin tube (catheter) into an artery. X-rays are then taken, which show if there is a blockage or problem in a blood vessel.  You will be given a sedative to help you relax during the procedure. A local anesthetic will be used to numb the area where the catheter is inserted. You may feel pressure when the catheter is inserted, and you may feel a warm sensation when the dye is injected.  After the procedure, you will be asked to lie flat for several hours. The arm or leg where the catheter was inserted will need to be kept straight while you are in the recovery room. This information is not intended to replace advice given to you by your health care provider. Make sure you discuss any  questions you have with your health care provider. Document Released: 09/08/2013 Document Revised: 04/06/2017 Document Reviewed: 05/29/2016 Elsevier Patient Education  [place]. Moderate Conscious Sedation, Adult, Care After These instructions provide you with information about caring for yourself after your procedure. Your health care provider may also give you more specific instructions. Your treatment has been planned according to current medical practices,  but problems sometimes occur. Call your health care provider if you have any problems or questions after your procedure. What can I expect after the procedure? After your procedure, it is common:  To feel sleepy for several hours.  To feel clumsy and have poor balance for several hours.  To have poor judgment for several hours.  To vomit if you eat too soon. Follow these instructions at home: For at least 24 hours after the procedure:   Do not: ? Participate in activities where you could fall or become injured. ? Drive. ? Use heavy machinery. ? Drink alcohol. ? Take sleeping pills or medicines that cause drowsiness. ? Make important decisions or sign legal documents. ? Take care of children on your own.  Rest. Eating and drinking  Follow the diet recommended by your health care provider.  If you vomit: ? Drink water, juice, or soup when you can drink without vomiting. ? Make sure you have little or no nausea before eating solid foods. General instructions  Have a responsible adult stay with you until you are awake and alert.  Take over-the-counter and prescription medicines only as told by your health care provider.  If you smoke, do not smoke without supervision.  Keep all follow-up visits as told by your health care provider. This is important. Contact a health care provider if:  You keep feeling nauseous or you keep vomiting.  You feel light-headed.  You develop a rash.  You have a fever. Get  help right away if:  You have trouble breathing. This information is not intended to replace advice given to you by your health care provider. Make sure you discuss any questions you have with your health care provider. Document Released: 02/12/2013 Document Revised: 04/06/2017 Document Reviewed: 08/14/2015 Elsevier Patient Education  2020 [employer].

## 2019-04-21 NOTE — Sedation Documentation (Signed)
Right groin sheath removed. Quick clot applied 

## 2019-04-22 ENCOUNTER — Other Ambulatory Visit (HOSPITAL_COMMUNITY): Payer: Self-pay | Admitting: Interventional Radiology

## 2019-04-22 ENCOUNTER — Telehealth: Payer: Self-pay | Admitting: Cardiovascular Disease

## 2019-04-22 NOTE — Telephone Encounter (Signed)
New message    Patient calling to discuss adding BP medication at the request of Dr Estanislado Pandy. Patient did not have any BP readings

## 2019-04-23 ENCOUNTER — Other Ambulatory Visit: Payer: Self-pay

## 2019-04-23 ENCOUNTER — Encounter: Payer: Self-pay | Admitting: Gastroenterology

## 2019-04-23 ENCOUNTER — Ambulatory Visit (INDEPENDENT_AMBULATORY_CARE_PROVIDER_SITE_OTHER): Payer: Medicare HMO | Admitting: Gastroenterology

## 2019-04-23 VITALS — BP 126/82 | HR 72 | Temp 97.0°F | Ht 70.0 in | Wt 173.0 lb

## 2019-04-23 DIAGNOSIS — R1310 Dysphagia, unspecified: Secondary | ICD-10-CM | POA: Diagnosis not present

## 2019-04-23 DIAGNOSIS — R1030 Lower abdominal pain, unspecified: Secondary | ICD-10-CM

## 2019-04-23 DIAGNOSIS — Z86010 Personal history of colonic polyps: Secondary | ICD-10-CM

## 2019-04-23 DIAGNOSIS — K219 Gastro-esophageal reflux disease without esophagitis: Secondary | ICD-10-CM | POA: Diagnosis not present

## 2019-04-23 DIAGNOSIS — Z7902 Long term (current) use of antithrombotics/antiplatelets: Secondary | ICD-10-CM | POA: Diagnosis not present

## 2019-04-23 MED ORDER — PANTOPRAZOLE SODIUM 20 MG PO TBEC: 20.0000 mg | DELAYED_RELEASE_TABLET | Freq: Two times a day (BID) | ORAL | Status: DC

## 2019-04-23 MED ORDER — AMLODIPINE BESYLATE 10 MG PO TABS: 10.0000 mg | ORAL_TABLET | Freq: Every day | ORAL | 3 refills | Status: DC

## 2019-04-23 NOTE — Telephone Encounter (Signed)
Patient called back. Patient stated that Dr. Estanislado Pandy wanted him to start on a BP medication but was going to leave that up to Dr. Johnsie Cancel. Patient stated Dr. Estanislado Pandy said Dr. Johnsie Cancel would know the right BP medication to put him on. Will forward to Dr. Johnsie Cancel for advisement.

## 2019-04-23 NOTE — Telephone Encounter (Signed)
Call patient with recommendations. Patient verbalized understanding. Sent prescription to patient's pharmacy of choice.

## 2019-04-23 NOTE — Patient Instructions (Signed)
Decrease your pantoprazole to 20 mg one tablet by mouth twice daily.   We changed your colonoscopy recall to 07/2022.

## 2019-04-23 NOTE — Telephone Encounter (Signed)
Left message to call back  

## 2019-04-23 NOTE — Telephone Encounter (Signed)
Can start norvasc 10 mg daily

## 2019-04-23 NOTE — Progress Notes (Signed)
HPI :  72 year old male here for follow-up visit.  He has a history of well-controlled HIV, history of reflux and dysphagia, and intermittent abdominal pain.  He has been on Protonix 40 mg twice daily for a long time.  In general he has had PPI use for more than 20 years.  Main symptoms are pyrosis and regurgitation.  On Protonix 20 mg twice a day this generally works to control his symptoms.  However when he has dietary indiscretion it can flare his symptoms and bothersome.  This will bother him periodically, perhaps once or twice a week.  If he watches his diet he does not have much breakthrough.  If he does not take his PPI he has worsening symptoms.  He has had occasional dysphagia but this is stable and not too bothersome for him.  He has had an extensive evaluation for these issues in the past.  He had a prior barium swallow which showed no obvious stenosis or stricture, no dysmotility, however the barium tablet did get hung up for short period of time.  In 2018 he had an EGD, biopsies ruled out EOE, no obvious stenosis, he was empirically dilated to 18 mm which did not provide any significant benefit.  He then underwent esophageal manometry in April 2019 which showed no evidence of dysmotility, but did show a hypotensive LES.  Of note he does have chronic kidney disease which has been stable since 2014.  Otherwise continues to have some lower to mid abdominal pain which happens occasionally, perhaps once a week.  He thinks milk in particular can reproduce his symptoms, although it is not associated with any change in his bowels at all.  He denies any diarrhea or constipation.  No blood in his stools.  Symptoms having a bowel movement can make it better.  Symptoms have been ongoing for several years.  He had a CT scan done in 2019 for this which showed a periumbilical hernia which was small, nothing else concerning.  He has historically not wish to pursue any aggressive interventions.  He has declined  Bentyl.  Of note the patient's been on Plavix since Thanksgiving.  He has had recent brain MRI as well as angiogram for vascular stenosis in his carotid artery and intracerebral vessels.  He states he will be having stenting of some of these lesions in the near future and he is avoiding other elective work-ups right now.  Prior workup: Esophageal manometry 09/03/2017 - low resting LES pressure with appropriate relaxation, no evidence of dysmotility EGD 10/16/2016 - normal esophagus, empiric dilation to 2m, biopsies to rule out EoE, gastritis noted - H pylori negative, no EoE CT scan 09/06/2017 - s/p cholecystectomy, bilateral inguinal hernia, small periumbilical hernia, no pathology, normal pancreas Colonoscopy 08/04/2015 - 2 small polyps, internal hemorrhoids - adenomas - due for repeat 07/2022 Barium swallow 11/21/2017 - small HH, evidence of reflux, no obvious stricture, normal motility       Past Medical History:  Diagnosis Date  . Chronic kidney disease   . Colon polyps   . GERD (gastroesophageal reflux disease)   . [place]  . TIA (transient ischemic attack)      Past Surgical History:  Procedure Laterality Date  . CHOLECYSTECTOMY  1982  . ESOPHAGEAL MANOMETRY N/A 09/03/2017   Procedure: ESOPHAGEAL MANOMETRY (EM);  Surgeon: NMauri Pole MD;  Location: [facility];  Service: Endoscopy;  Laterality: N/A;  . KNEE SURGERY     LEFT  KNEE  . TONSILLECTOMY     Family History  Problem Relation Age of Onset  . Hypertension Mother   . Heart attack Mother 38  . Colon cancer Neg Hx   . Esophageal cancer Neg Hx   . Pancreatic cancer Neg Hx   . Stomach cancer Neg Hx   . Liver disease Neg Hx    Social History   Tobacco Use  . Smoking status: Never Smoker  . Smokeless tobacco: Former Systems developer    Types: Chew  . Tobacco comment: Rare use, about q 6 months  Substance Use Topics  . Alcohol use: Yes    Comment: rare  . Drug use: No     Current Outpatient Medications  Medication Sig Dispense Refill  . aspirin EC 81 MG tablet Take 1 tablet (81 mg total) by mouth daily.    . clopidogrel (PLAVIX) 75 MG tablet Take 1 tablet (75 mg total) by mouth daily. 90 tablet 3  . finasteride (PROSCAR) 5 MG tablet Take 5 mg by mouth daily.    . Multiple Vitamins-Minerals (MULTIVITAMIN PO) Take 1 tablet by mouth daily.    . TRIUMEQ 600-50-300 MG tablet Take 1 tablet by mouth once daily 30 tablet 5  . pantoprazole (PROTONIX) 20 MG tablet Take 1 tablet (20 mg total) by mouth 2 (two) times daily.     No current facility-administered medications for this visit.   Allergies  Allergen Reactions  . Baclofen Other (See Comments)    Back pain. Patient said it made him feel crazy Back pain. Patient said it made him feel crazy  . Efavirenz Other (See Comments)    Rash Rash  . Nevirapine Other (See Comments)    Rash Rash     Review of Systems: All systems reviewed and negative except where noted in HPI.    MR ANGIO HEAD WO CONTRAST  Result Date: 04/02/2019 CLINICAL DATA:  TIA, initial exam. Additional history provided: Patient had TIA or possible stroke in April which resulted in transient right-sided weakness. EXAM: MRI HEAD WITHOUT CONTRAST MRA HEAD WITHOUT CONTRAST TECHNIQUE: Multiplanar, multiecho pulse sequences of the brain and surrounding structures were obtained without intravenous contrast. Angiographic images of the head were obtained using MRA technique without contrast. COMPARISON:  No pertinent prior studies available for comparison. FINDINGS: MRI HEAD FINDINGS Brain: There is no evidence of acute infarct. No evidence of intracranial mass. No midline shift or extra-axial fluid collection. No chronic intracranial blood products. Mild scattered T2/FLAIR hyperintensity within the cerebral white matter is nonspecific, but consistent with chronic small vessel ischemic disease. Prominent perivascular space within the inferior left  basal ganglia. Mild generalized parenchymal atrophy. Vascular: Reported separately Skull and upper cervical spine: No focal marrow lesion Sinuses/Orbits: Visualized orbits demonstrate no acute abnormality. Mild paranasal sinus mucosal thickening. No significant mastoid effusion. MRA HEAD FINDINGS The intracranial internal carotid arteries are patent bilaterally. Moderate/severe focal stenosis within the paraclinoid/supraclinoid right internal carotid artery (series 401, image 5) (series 4, images 58-60). No significant stenosis within the intracranial left ICA. The M1 right middle cerebral artery is patent. Moderate/severe focal stenosis within the proximal M2 right MCA inferior division shortly beyond the bifurcation (series 402, image 3). Apparent high-grade stenosis within the proximal aspect of a hypoplastic A1 right anterior cerebral artery. The M1 left middle cerebral artery is patent without significant stenosis. Mild/moderate focal stenosis within a proximal left M2 branch (series 401, image 4). The A1 left anterior cerebral artery is dominant. No significant proximal A2 stenosis in  either anterior cerebral artery. No intracranial aneurysm is identified. The intracranial vertebral arteries are patent without significant proximal stenosis, as is the basilar artery. The bilateral posterior cerebral arteries are patent. Two moderate/severe focal stenoses within the P2 left posterior cerebral artery, one proximally and one distally. MRA impression 2, 3 and 5 below will be called to the ordering clinician or representative by the Radiologist Assistant, and communication documented in the PACS or zVision Dashboard. IMPRESSION: MRI brain: 1. No evidence of acute intracranial abnormality. 2. No remote cortical infarct is identified. 3. Mild generalized parenchymal atrophy and chronic small vessel ischemic disease. MRA head: 1. Intracranial atherosclerotic disease with multifocal stenoses, most notably as follows. 2.  Moderate/severe focal stenosis within the paraclinoid/supraclinoid right internal carotid artery. 3. Moderate/severe focal stenosis within the proximal right M2 MCA inferior division. 4. Apparent high-grade stenosis within the proximal aspect of a hypoplastic A1 right anterior cerebral artery. 5. Two moderate/severe focal stenoses within the P2 left posterior cerebral artery, one proximal and one distal. Electronically Signed   By: Kellie Simmering DO   On: 04/02/2019 21:38   MR BRAIN WO CONTRAST  Result Date: 04/02/2019 CLINICAL DATA:  TIA, initial exam. Additional history provided: Patient had TIA or possible stroke in April which resulted in transient right-sided weakness. EXAM: MRI HEAD WITHOUT CONTRAST MRA HEAD WITHOUT CONTRAST TECHNIQUE: Multiplanar, multiecho pulse sequences of the brain and surrounding structures were obtained without intravenous contrast. Angiographic images of the head were obtained using MRA technique without contrast. COMPARISON:  No pertinent prior studies available for comparison. FINDINGS: MRI HEAD FINDINGS Brain: There is no evidence of acute infarct. No evidence of intracranial mass. No midline shift or extra-axial fluid collection. No chronic intracranial blood products. Mild scattered T2/FLAIR hyperintensity within the cerebral white matter is nonspecific, but consistent with chronic small vessel ischemic disease. Prominent perivascular space within the inferior left basal ganglia. Mild generalized parenchymal atrophy. Vascular: Reported separately Skull and upper cervical spine: No focal marrow lesion Sinuses/Orbits: Visualized orbits demonstrate no acute abnormality. Mild paranasal sinus mucosal thickening. No significant mastoid effusion. MRA HEAD FINDINGS The intracranial internal carotid arteries are patent bilaterally. Moderate/severe focal stenosis within the paraclinoid/supraclinoid right internal carotid artery (series 401, image 5) (series 4, images 58-60). No  significant stenosis within the intracranial left ICA. The M1 right middle cerebral artery is patent. Moderate/severe focal stenosis within the proximal M2 right MCA inferior division shortly beyond the bifurcation (series 402, image 3). Apparent high-grade stenosis within the proximal aspect of a hypoplastic A1 right anterior cerebral artery. The M1 left middle cerebral artery is patent without significant stenosis. Mild/moderate focal stenosis within a proximal left M2 branch (series 401, image 4). The A1 left anterior cerebral artery is dominant. No significant proximal A2 stenosis in either anterior cerebral artery. No intracranial aneurysm is identified. The intracranial vertebral arteries are patent without significant proximal stenosis, as is the basilar artery. The bilateral posterior cerebral arteries are patent. Two moderate/severe focal stenoses within the P2 left posterior cerebral artery, one proximally and one distally. MRA impression 2, 3 and 5 below will be called to the ordering clinician or representative by the Radiologist Assistant, and communication documented in the PACS or zVision Dashboard. IMPRESSION: MRI brain: 1. No evidence of acute intracranial abnormality. 2. No remote cortical infarct is identified. 3. Mild generalized parenchymal atrophy and chronic small vessel ischemic disease. MRA head: 1. Intracranial atherosclerotic disease with multifocal stenoses, most notably as follows. 2. Moderate/severe focal stenosis within the paraclinoid/supraclinoid right internal  carotid artery. 3. Moderate/severe focal stenosis within the proximal right M2 MCA inferior division. 4. Apparent high-grade stenosis within the proximal aspect of a hypoplastic A1 right anterior cerebral artery. 5. Two moderate/severe focal stenoses within the P2 left posterior cerebral artery, one proximal and one distal. Electronically Signed   By: Kellie Simmering DO   On: 04/02/2019 21:38   Lab Results  Component Value  Date   WBC 6.4 04/21/2019   HGB 13.9 04/21/2019   HCT 41.1 04/21/2019   MCV 103.5 (H) 04/21/2019   PLT 208 04/21/2019   Lab Results  Component Value Date   CREATININE 1.56 (H) 04/21/2019   BUN 21 04/21/2019   NA 142 04/21/2019   K 3.5 04/21/2019   CL 109 04/21/2019   CO2 24 04/21/2019    Lab Results  Component Value Date   ALT 18 02/10/2019   AST 17 02/10/2019   ALKPHOS 77 11/26/2018   BILITOT 0.4 02/10/2019     Physical Exam: BP 126/82   Pulse 72   Temp (!) 97 F (36.1 C)   Ht 5' 10"  (1.778 m)   Wt 173 lb (78.5 kg)   BMI 24.82 kg/m  Constitutional: Pleasant,well-developed, male in no acute distress. HEENT: Normocephalic and atraumatic. Conjunctivae are normal. No scleral icterus. Neck supple.  Cardiovascular: Normal rate, regular rhythm.  Pulmonary/chest: Effort normal and breath sounds normal. No wheezing, rales or rhonchi. Abdominal: Soft, nondistended, nontender. There are no masses palpable.  Extremities: no edema Lymphadenopathy: No cervical adenopathy noted. Neurological: Alert and oriented to person place and time. Skin: Skin is warm and dry. No rashes noted. Psychiatric: Normal mood and affect. Behavior is normal.   ASSESSMENT AND PLAN: 73 year old male here for reassessment of the following:  GERD - as above, longstanding symptoms, no evidence of Barrett's esophagus on prior EGD.  He is required chronic PPI dosing to control symptoms at variable doses.  Currently on Protonix 40 mg twice a day.  This generally does a good job but he does have some periodic breakthrough depending on what he is eating.  He does have chronic kidney disease, would like to place him on the lowest dose of Protonix able to control symptoms.  He is agreeable to try 20 mg twice a day and see if that works for him.  If it does not he can increase the dose as he needs to.  We otherwise discussed alternative reflux therapies to consider endoscopic fundoplication if he wanted to come  off PPIs at some point.  He is interested in this however given his other more pressing medical issues with carotid artery stenosis etc., he is going to address that first and can follow-up with me as needed once he is recovered from those issues.  He agreed with the plan.  Dysphagia - barium study did not show any significant abnormalit, EGD with dilation without benefit, manometry normal.  Symptoms mild and stable, he does not want to pursue any further interventions for this at present time.  I reassured him regarding work-up today  Abdominal pain - chronic, prior CT showed periumbilical hernia but he does not feel that is the problem, perhaps more so food intolerance.  Symptoms are mild and not too bothersome.  He declined a trial of Bentyl.  He can follow-up as needed for this.  History of colon polyps - we will change recall to March 2024 due to change in guidelines (7 year follow up as opposed to 5 yrs is currently recommended for  2 small adenomas)  Ocheyedan Cellar, MD [facility] Gastro[facility]

## 2019-04-24 ENCOUNTER — Encounter (HOSPITAL_COMMUNITY): Payer: Self-pay

## 2019-04-28 ENCOUNTER — Telehealth: Payer: Self-pay | Admitting: Cardiovascular Disease

## 2019-04-28 NOTE — Telephone Encounter (Signed)
Called patient back. Informed patient that a message would be sent to Dr. Estanislado Pandy. Patient would like to know when he will be schedule for a procedure that was discussed on 04/21/19. Per imaging report on 04/21/19 patient is to proceed with endovascular revascularization, which stated will be scheduled at the earliest. Patient does not know who to contact, or who to ask questions about this procedure. Informed patient to use number on information given to him, which is the hospital main number. Informed patient that a message would be sent to Dr. Estanislado Pandy or his nurse so they can get in contact with him. Patient agreed to plan.

## 2019-04-28 NOTE — Telephone Encounter (Signed)
New message    Patient calling to find out "what he needs to do for blockage". Patient states he was told he  would receive a call to schedule a procedure

## 2019-05-01 ENCOUNTER — Other Ambulatory Visit (HOSPITAL_COMMUNITY): Payer: Self-pay | Admitting: Interventional Radiology

## 2019-05-01 DIAGNOSIS — I771 Stricture of artery: Secondary | ICD-10-CM

## 2019-05-08 ENCOUNTER — Encounter (HOSPITAL_COMMUNITY): Payer: Self-pay | Admitting: Interventional Radiology

## 2019-05-10 ENCOUNTER — Other Ambulatory Visit (HOSPITAL_COMMUNITY)
Admission: RE | Admit: 2019-05-10 | Discharge: 2019-05-10 | Disposition: A | Discharge status: Home / Self Care | Payer: Medicare HMO | Source: Ambulatory Visit | Attending: Interventional Radiology | Admitting: Interventional Radiology

## 2019-05-10 DIAGNOSIS — Z01812 Encounter for preprocedural laboratory examination: Secondary | ICD-10-CM | POA: Insufficient documentation

## 2019-05-10 DIAGNOSIS — I6521 Occlusion and stenosis of right carotid artery: Secondary | ICD-10-CM | POA: Insufficient documentation

## 2019-05-10 DIAGNOSIS — R69 Illness, unspecified: Secondary | ICD-10-CM | POA: Diagnosis not present

## 2019-05-10 DIAGNOSIS — Z20822 Contact with and (suspected) exposure to covid-19: Secondary | ICD-10-CM | POA: Diagnosis not present

## 2019-05-10 DIAGNOSIS — Z79899 Other long term (current) drug therapy: Secondary | ICD-10-CM | POA: Insufficient documentation

## 2019-05-10 DIAGNOSIS — Z7982 Long term (current) use of aspirin: Secondary | ICD-10-CM | POA: Insufficient documentation

## 2019-05-11 LAB — NOVEL CORONAVIRUS, NAA (HOSP ORDER, SEND-OUT TO REF LAB; TAT 18-24 HRS): SARS-CoV-2, NAA: NOT DETECTED

## 2019-05-12 ENCOUNTER — Other Ambulatory Visit: Payer: Self-pay | Admitting: Radiology

## 2019-05-12 ENCOUNTER — Other Ambulatory Visit: Payer: Self-pay

## 2019-05-12 ENCOUNTER — Encounter (HOSPITAL_COMMUNITY): Payer: Self-pay | Admitting: Interventional Radiology

## 2019-05-12 NOTE — Progress Notes (Signed)
Spoke with pt for pre-op call. Pt sees Dr. Johnsie Cancel for HTN, states he does have a murmur but has not had any problems that he is aware of, last office visit 02/27/19. Pt denies Diabetes.   Covid test done on 05/10/19 and it is negative. Pt states he has been in quarantine since the test was done and will continue to stay in it until day of surgery.  Pt states he is taking his Plavix and Aspirin as prescribed.  Reviewed Visitor policy and pt voiced understanding.

## 2019-05-13 ENCOUNTER — Other Ambulatory Visit: Payer: Self-pay | Admitting: Student

## 2019-05-14 ENCOUNTER — Ambulatory Visit (HOSPITAL_COMMUNITY): Payer: Medicare HMO | Admitting: Anesthesiology

## 2019-05-14 ENCOUNTER — Inpatient Hospital Stay (HOSPITAL_COMMUNITY)
Admission: AD | Admit: 2019-05-14 | Discharge: 2019-05-15 | DRG: 038 | Disposition: A | Discharge status: Home / Self Care | Payer: Medicare HMO | Attending: Interventional Radiology | Admitting: Interventional Radiology

## 2019-05-14 ENCOUNTER — Encounter (HOSPITAL_COMMUNITY): Payer: Self-pay | Admitting: Interventional Radiology

## 2019-05-14 ENCOUNTER — Ambulatory Visit (HOSPITAL_COMMUNITY)
Admission: RE | Admit: 2019-05-14 | Discharge: 2019-05-14 | Disposition: A | Discharge status: Home / Self Care | Payer: Medicare HMO | Source: Ambulatory Visit | Attending: Interventional Radiology | Admitting: Interventional Radiology

## 2019-05-14 ENCOUNTER — Encounter (HOSPITAL_COMMUNITY)
Admission: AD | Disposition: A | Discharge status: Home / Self Care | Payer: Self-pay | Source: Home / Self Care | Attending: Interventional Radiology

## 2019-05-14 ENCOUNTER — Other Ambulatory Visit: Payer: Self-pay

## 2019-05-14 DIAGNOSIS — Z8249 Family history of ischemic heart disease and other diseases of the circulatory system: Secondary | ICD-10-CM | POA: Diagnosis not present

## 2019-05-14 DIAGNOSIS — I129 Hypertensive chronic kidney disease with stage 1 through stage 4 chronic kidney disease, or unspecified chronic kidney disease: Secondary | ICD-10-CM | POA: Diagnosis present

## 2019-05-14 DIAGNOSIS — Z888 Allergy status to other drugs, medicaments and biological substances status: Secondary | ICD-10-CM | POA: Diagnosis not present

## 2019-05-14 DIAGNOSIS — N400 Enlarged prostate without lower urinary tract symptoms: Secondary | ICD-10-CM | POA: Diagnosis not present

## 2019-05-14 DIAGNOSIS — Z87891 Personal history of nicotine dependence: Secondary | ICD-10-CM

## 2019-05-14 DIAGNOSIS — I5030 Unspecified diastolic (congestive) heart failure: Secondary | ICD-10-CM | POA: Diagnosis not present

## 2019-05-14 DIAGNOSIS — Z79899 Other long term (current) drug therapy: Secondary | ICD-10-CM

## 2019-05-14 DIAGNOSIS — Z7982 Long term (current) use of aspirin: Secondary | ICD-10-CM

## 2019-05-14 DIAGNOSIS — Z7902 Long term (current) use of antithrombotics/antiplatelets: Secondary | ICD-10-CM

## 2019-05-14 DIAGNOSIS — K219 Gastro-esophageal reflux disease without esophagitis: Secondary | ICD-10-CM | POA: Diagnosis not present

## 2019-05-14 DIAGNOSIS — N189 Chronic kidney disease, unspecified: Secondary | ICD-10-CM | POA: Diagnosis present

## 2019-05-14 DIAGNOSIS — I6521 Occlusion and stenosis of right carotid artery: Secondary | ICD-10-CM | POA: Diagnosis not present

## 2019-05-14 DIAGNOSIS — I69351 Hemiplegia and hemiparesis following cerebral infarction affecting right dominant side: Secondary | ICD-10-CM

## 2019-05-14 DIAGNOSIS — Z21 Asymptomatic human immunodeficiency virus [HIV] infection status: Secondary | ICD-10-CM | POA: Diagnosis not present

## 2019-05-14 DIAGNOSIS — I771 Stricture of artery: Secondary | ICD-10-CM

## 2019-05-14 DIAGNOSIS — Z87442 Personal history of urinary calculi: Secondary | ICD-10-CM

## 2019-05-14 DIAGNOSIS — I130 Hypertensive heart and chronic kidney disease with heart failure and stage 1 through stage 4 chronic kidney disease, or unspecified chronic kidney disease: Secondary | ICD-10-CM | POA: Diagnosis not present

## 2019-05-14 HISTORY — PX: RADIOLOGY WITH ANESTHESIA: SHX6223

## 2019-05-14 HISTORY — DX: Cardiac murmur, unspecified: R01.1

## 2019-05-14 HISTORY — DX: Essential (primary) hypertension: I10

## 2019-05-14 HISTORY — DX: Cerebral infarction, unspecified: I63.9

## 2019-05-14 HISTORY — DX: Pneumonia, unspecified organism: J18.9

## 2019-05-14 HISTORY — PX: IR PTA INTRACRANIAL: IMG2344

## 2019-05-14 HISTORY — DX: Personal history of urinary calculi: Z87.442

## 2019-05-14 LAB — MRSA PCR SCREENING: MRSA by PCR: NEGATIVE

## 2019-05-14 LAB — URINALYSIS, COMPLETE (UACMP) WITH MICROSCOPIC
Bacteria, UA: NONE SEEN
Bilirubin Urine: NEGATIVE
Glucose, UA: 50 mg/dL — AB
Ketones, ur: NEGATIVE mg/dL
Leukocytes,Ua: NEGATIVE
Nitrite: NEGATIVE
Protein, ur: 30 mg/dL — AB
Specific Gravity, Urine: 1.014 (ref 1.005–1.030)
pH: 6 (ref 5.0–8.0)

## 2019-05-14 LAB — CBC WITH DIFFERENTIAL/PLATELET
Abs Immature Granulocytes: 0.01 10*3/uL (ref 0.00–0.07)
Basophils Absolute: 0 10*3/uL (ref 0.0–0.1)
Basophils Relative: 0 %
Eosinophils Absolute: 0.1 10*3/uL (ref 0.0–0.5)
Eosinophils Relative: 3 %
HCT: 41.6 % (ref 39.0–52.0)
Hemoglobin: 14.4 g/dL (ref 13.0–17.0)
Immature Granulocytes: 0 %
Lymphocytes Relative: 37 %
Lymphs Abs: 1.9 10*3/uL (ref 0.7–4.0)
MCH: 34.6 pg — ABNORMAL HIGH (ref 26.0–34.0)
MCHC: 34.6 g/dL (ref 30.0–36.0)
MCV: 100 fL (ref 80.0–100.0)
Monocytes Absolute: 0.4 10*3/uL (ref 0.1–1.0)
Monocytes Relative: 8 %
Neutro Abs: 2.8 10*3/uL (ref 1.7–7.7)
Neutrophils Relative %: 52 %
Platelets: 174 10*3/uL (ref 150–400)
RBC: 4.16 MIL/uL — ABNORMAL LOW (ref 4.22–5.81)
RDW: 12.5 % (ref 11.5–15.5)
WBC: 5.3 10*3/uL (ref 4.0–10.5)
nRBC: 0 % (ref 0.0–0.2)

## 2019-05-14 LAB — BASIC METABOLIC PANEL
Anion gap: 8 (ref 5–15)
BUN: 29 mg/dL — ABNORMAL HIGH (ref 8–23)
CO2: 24 mmol/L (ref 22–32)
Calcium: 9.2 mg/dL (ref 8.9–10.3)
Chloride: 107 mmol/L (ref 98–111)
Creatinine, Ser: 1.41 mg/dL — ABNORMAL HIGH (ref 0.61–1.24)
GFR calc Af Amer: 58 mL/min — ABNORMAL LOW (ref >60)
GFR calc non Af Amer: 50 mL/min — ABNORMAL LOW (ref >60)
Glucose, Bld: 103 mg/dL — ABNORMAL HIGH (ref 70–99)
Potassium: 3.6 mmol/L (ref 3.5–5.1)
Sodium: 139 mmol/L (ref 135–145)

## 2019-05-14 LAB — PROTIME-INR
INR: 1 (ref 0.8–1.2)
Prothrombin Time: 12.6 seconds (ref 11.4–15.2)

## 2019-05-14 LAB — HEPARIN LEVEL (UNFRACTIONATED): Heparin Unfractionated: 0.43 IU/mL (ref 0.30–0.70)

## 2019-05-14 LAB — APTT: aPTT: 30 seconds (ref 24–36)

## 2019-05-14 LAB — PLATELET INHIBITION P2Y12: Platelet Function  P2Y12: 164 [PRU] — ABNORMAL LOW (ref 182–335)

## 2019-05-14 LAB — POCT ACTIVATED CLOTTING TIME: Activated Clotting Time: 197 seconds

## 2019-05-14 SURGERY — IR WITH ANESTHESIA
Anesthesia: General

## 2019-05-14 MED ORDER — NITROGLYCERIN 1 MG/10 ML FOR IR/CATH LAB
INTRA_ARTERIAL | Status: AC
  Filled 2019-05-14: qty 10

## 2019-05-14 MED ORDER — HEPARIN SODIUM (PORCINE) 1000 UNIT/ML IJ SOLN
INTRAMUSCULAR | Status: DC | PRN
  Administered 2019-05-14: 3000 [IU] via INTRAVENOUS
  Administered 2019-05-14: 1000 [IU] via INTRAVENOUS
  Administered 2019-05-14: 500 [IU] via INTRAVENOUS

## 2019-05-14 MED ORDER — ACETAMINOPHEN 650 MG RE SUPP: 650.0000 mg | RECTAL | Status: DC | PRN

## 2019-05-14 MED ORDER — DEXAMETHASONE SODIUM PHOSPHATE 10 MG/ML IJ SOLN
INTRAMUSCULAR | Status: DC | PRN
  Administered 2019-05-14: 5 mg via INTRAVENOUS

## 2019-05-14 MED ORDER — IOHEXOL 300 MG/ML  SOLN
150.0000 mL | Freq: Once | INTRAMUSCULAR | Status: AC | PRN
  Administered 2019-05-14: 75 mL via INTRA_ARTERIAL

## 2019-05-14 MED ORDER — FINASTERIDE 5 MG PO TABS
5.0000 mg | ORAL_TABLET | Freq: Every day | ORAL | Status: DC
  Administered 2019-05-15: 5 mg via ORAL
  Filled 2019-05-14: qty 1

## 2019-05-14 MED ORDER — NIMODIPINE 30 MG PO CAPS: 0.0000 mg | ORAL_CAPSULE | ORAL | Status: DC

## 2019-05-14 MED ORDER — LIDOCAINE HCL (CARDIAC) PF 100 MG/5ML IV SOSY
PREFILLED_SYRINGE | INTRAVENOUS | Status: DC | PRN
  Administered 2019-05-14: 60 mg via INTRAVENOUS

## 2019-05-14 MED ORDER — HEPARIN (PORCINE) 25000 UT/250ML-% IV SOLN
750.0000 [IU]/h | INTRAVENOUS | Status: DC
  Administered 2019-05-14: 750 [IU]/h via INTRAVENOUS
  Filled 2019-05-14: qty 250

## 2019-05-14 MED ORDER — ASPIRIN EC 325 MG PO TBEC: 325.0000 mg | DELAYED_RELEASE_TABLET | ORAL | Status: DC

## 2019-05-14 MED ORDER — HEPARIN (PORCINE) 25000 UT/250ML-% IV SOLN
500.0000 [IU]/h | INTRAVENOUS | Status: DC
  Administered 2019-05-14: 500 [IU]/h via INTRAVENOUS

## 2019-05-14 MED ORDER — AMLODIPINE BESYLATE 10 MG PO TABS
10.0000 mg | ORAL_TABLET | Freq: Every day | ORAL | Status: DC
  Administered 2019-05-15: 10 mg via ORAL
  Filled 2019-05-14: qty 1

## 2019-05-14 MED ORDER — CLEVIDIPINE BUTYRATE 0.5 MG/ML IV EMUL
INTRAVENOUS | Status: AC
  Filled 2019-05-14: qty 50

## 2019-05-14 MED ORDER — EPTIFIBATIDE 20 MG/10ML IV SOLN
INTRAVENOUS | Status: AC
  Filled 2019-05-14: qty 10

## 2019-05-14 MED ORDER — PANTOPRAZOLE SODIUM 20 MG PO TBEC: 20.0000 mg | DELAYED_RELEASE_TABLET | Freq: Two times a day (BID) | ORAL | Status: DC

## 2019-05-14 MED ORDER — HEPARIN SOD (PORK) LOCK FLUSH 100 UNIT/ML IV SOLN
INTRAVENOUS | Status: AC
  Filled 2019-05-14: qty 5

## 2019-05-14 MED ORDER — ASPIRIN 325 MG PO TABS
325.0000 mg | ORAL_TABLET | Freq: Every day | ORAL | Status: DC
  Administered 2019-05-15: 325 mg via ORAL
  Filled 2019-05-14: qty 1

## 2019-05-14 MED ORDER — PANTOPRAZOLE SODIUM 20 MG PO TBEC
20.0000 mg | DELAYED_RELEASE_TABLET | Freq: Two times a day (BID) | ORAL | Status: DC
  Administered 2019-05-15: 20 mg via ORAL
  Filled 2019-05-14: qty 1

## 2019-05-14 MED ORDER — CHLORHEXIDINE GLUCONATE CLOTH 2 % EX PADS: 6.0000 | MEDICATED_PAD | Freq: Every day | CUTANEOUS | Status: DC

## 2019-05-14 MED ORDER — HEPARIN (PORCINE) 25000 UT/250ML-% IV SOLN
INTRAVENOUS | Status: AC
  Filled 2019-05-14: qty 250

## 2019-05-14 MED ORDER — ACETAMINOPHEN 325 MG PO TABS
650.0000 mg | ORAL_TABLET | ORAL | Status: DC | PRN
  Administered 2019-05-14: 650 mg via ORAL
  Filled 2019-05-14: qty 2

## 2019-05-14 MED ORDER — CLEVIDIPINE BUTYRATE 0.5 MG/ML IV EMUL
INTRAVENOUS | Status: AC
  Administered 2019-05-14: 25 mg
  Filled 2019-05-14: qty 50

## 2019-05-14 MED ORDER — PHENYLEPHRINE HCL (PRESSORS) 10 MG/ML IV SOLN
INTRAVENOUS | Status: DC | PRN
  Administered 2019-05-14 (×4): 80 ug via INTRAVENOUS

## 2019-05-14 MED ORDER — ASPIRIN 81 MG PO CHEW: 324.0000 mg | CHEWABLE_TABLET | Freq: Every day | ORAL | Status: DC

## 2019-05-14 MED ORDER — LIDOCAINE HCL (PF) 1 % IJ SOLN
INTRAMUSCULAR | Status: DC | PRN
  Administered 2019-05-14: 10 mL

## 2019-05-14 MED ORDER — PHENYLEPHRINE HCL-NACL 10-0.9 MG/250ML-% IV SOLN
INTRAVENOUS | Status: DC | PRN
  Administered 2019-05-14: 20 ug/min via INTRAVENOUS

## 2019-05-14 MED ORDER — ROCURONIUM BROMIDE 100 MG/10ML IV SOLN
INTRAVENOUS | Status: DC | PRN
  Administered 2019-05-14: 80 mg via INTRAVENOUS

## 2019-05-14 MED ORDER — ACETAMINOPHEN 160 MG/5ML PO SOLN: 650.0000 mg | ORAL | Status: DC | PRN

## 2019-05-14 MED ORDER — CLOPIDOGREL BISULFATE 75 MG PO TABS: 75.0000 mg | ORAL_TABLET | Freq: Every day | ORAL | Status: DC

## 2019-05-14 MED ORDER — EPTIFIBATIDE 20 MG/10ML IV SOLN
INTRAVENOUS | Status: DC | PRN
  Administered 2019-05-14 (×3): 1.5 mg via INTRAVENOUS

## 2019-05-14 MED ORDER — PROPOFOL 10 MG/ML IV BOLUS
INTRAVENOUS | Status: DC | PRN
  Administered 2019-05-14: 160 mg via INTRAVENOUS

## 2019-05-14 MED ORDER — LIDOCAINE HCL 1 % IJ SOLN
INTRAMUSCULAR | Status: AC
  Filled 2019-05-14: qty 20

## 2019-05-14 MED ORDER — ONDANSETRON HCL 4 MG/2ML IJ SOLN: 4.0000 mg | Freq: Four times a day (QID) | INTRAMUSCULAR | Status: DC | PRN

## 2019-05-14 MED ORDER — LACTATED RINGERS IV SOLN: INTRAVENOUS | Status: DC | PRN

## 2019-05-14 MED ORDER — SUGAMMADEX SODIUM 200 MG/2ML IV SOLN
INTRAVENOUS | Status: DC | PRN
  Administered 2019-05-14: 300 mg via INTRAVENOUS

## 2019-05-14 MED ORDER — CLOPIDOGREL BISULFATE 75 MG PO TABS
75.0000 mg | ORAL_TABLET | ORAL | Status: AC
  Administered 2019-05-14: 75 mg via ORAL
  Filled 2019-05-14 (×2): qty 1

## 2019-05-14 MED ORDER — CLOPIDOGREL BISULFATE 75 MG PO TABS
75.0000 mg | ORAL_TABLET | Freq: Every day | ORAL | Status: DC
  Administered 2019-05-15: 75 mg via ORAL
  Filled 2019-05-14: qty 1

## 2019-05-14 MED ORDER — SODIUM CHLORIDE 0.9 % IV SOLN: INTRAVENOUS | Status: DC

## 2019-05-14 MED ORDER — CLEVIDIPINE BUTYRATE 0.5 MG/ML IV EMUL
0.0000 mg/h | INTRAVENOUS | Status: AC
  Administered 2019-05-14: 14 mg/h via INTRAVENOUS
  Administered 2019-05-14 (×2): 16 mg/h via INTRAVENOUS
  Administered 2019-05-14: 19 mg/h via INTRAVENOUS
  Administered 2019-05-14: 1 mg/h via INTRAVENOUS
  Administered 2019-05-15 (×3): 16 mg/h via INTRAVENOUS
  Administered 2019-05-15: 14 mg/h via INTRAVENOUS
  Filled 2019-05-14: qty 50
  Filled 2019-05-14: qty 150
  Filled 2019-05-14: qty 100
  Filled 2019-05-14: qty 150

## 2019-05-14 MED ORDER — FENTANYL CITRATE (PF) 100 MCG/2ML IJ SOLN
INTRAMUSCULAR | Status: DC | PRN
  Administered 2019-05-14: 25 ug via INTRAVENOUS
  Administered 2019-05-14: 50 ug via INTRAVENOUS
  Administered 2019-05-14: 25 ug via INTRAVENOUS

## 2019-05-14 MED ORDER — ONDANSETRON HCL 4 MG/2ML IJ SOLN
INTRAMUSCULAR | Status: DC | PRN
  Administered 2019-05-14: 4 mg via INTRAVENOUS

## 2019-05-14 MED ORDER — IOHEXOL 300 MG/ML  SOLN
150.0000 mL | Freq: Once | INTRAMUSCULAR | Status: AC | PRN
  Administered 2019-05-14: 60 mL via INTRA_ARTERIAL

## 2019-05-14 MED ORDER — ABACAVIR-DOLUTEGRAVIR-LAMIVUD 600-50-300 MG PO TABS
1.0000 | ORAL_TABLET | Freq: Every day | ORAL | Status: DC
  Administered 2019-05-15: 1 via ORAL
  Filled 2019-05-14: qty 1

## 2019-05-14 MED ORDER — MIDAZOLAM HCL 5 MG/5ML IJ SOLN
INTRAMUSCULAR | Status: DC | PRN
  Administered 2019-05-14 (×2): 1 mg via INTRAVENOUS

## 2019-05-14 MED ORDER — CEFAZOLIN SODIUM-DEXTROSE 2-4 GM/100ML-% IV SOLN
2.0000 g | INTRAVENOUS | Status: AC
  Administered 2019-05-14: 09:00:00 2 g via INTRAVENOUS
  Filled 2019-05-14: qty 100

## 2019-05-14 NOTE — Sedation Documentation (Signed)
No Foley insertion per MD

## 2019-05-14 NOTE — Sedation Documentation (Signed)
Pt under the care of anesthesia  

## 2019-05-14 NOTE — Anesthesia Preprocedure Evaluation (Signed)
Anesthesia Evaluation  Patient identified by MRN, date of birth, ID band Patient awake    Reviewed: Allergy & Precautions, NPO status , Patient's Chart, lab work & pertinent test results  Airway Mallampati: II  TM Distance: >3 FB Neck ROM: Full    Dental  (+) Teeth Intact, Dental Advisory Given   Pulmonary    breath sounds clear to auscultation       Cardiovascular hypertension,  Rhythm:Regular Rate:Normal     Neuro/Psych    GI/Hepatic   Endo/Other    Renal/GU      Musculoskeletal   Abdominal   Peds  Hematology   Anesthesia Other Findings   Reproductive/Obstetrics                             Anesthesia Physical Anesthesia Plan  ASA: III  Anesthesia Plan: General   Post-op Pain Management:    Induction: Intravenous  PONV Risk Score and Plan: Ondansetron and Dexamethasone  Airway Management Planned: Oral ETT  Additional Equipment: Arterial line  Intra-op Plan:   Post-operative Plan: Extubation in OR  Informed Consent: I have reviewed the patients History and Physical, chart, labs and discussed the procedure including the risks, benefits and alternatives for the proposed anesthesia with the patient or authorized representative who has indicated his/her understanding and acceptance.     Dental advisory given  Plan Discussed with: Anesthesiologist and CRNA  Anesthesia Plan Comments:         Anesthesia Quick Evaluation

## 2019-05-14 NOTE — Progress Notes (Addendum)
NIR.  Patient underwent an image-guided cerebral arteriogram with revascularization of right ICA supraclinoid stenosis using balloon angioplasty this AM by Dr. Estanislado Pandy.  Saw patient in PACU alongside Dr. Estanislado Pandy following procedure. Patient awake and alert laying in bed with no complaints at this time. Denies headache, weakness, numbness/tingling, dizziness, vision changes, hearing changes, tinnitus, or speech difficulty.  Alert, awake, and oriented x3. Speech and comprehension intact. PERRL bilaterally. No facial asymmetry. Tongue midline. Motor power symmetric proportional to effort. No pronator drift. Fine motor and coordination intact and symmetric. Distal pulses 2+ bilaterally. Right groin incision soft without active bleeding or hematoma.  Plan to stay in neuro ICU for overnight observation. BP goal = 631-497 systolic. Advance diet as tolerated. For possible D/C in AM if stable. NIR to follow.   Bea Graff Ysela Hettinger, PA-C 05/14/2019, 12:01 PM    ADDENDUM: Re-evaluated patient alongside Dr. Estanislado Pandy in PACU at approximately 1500. Patient still without complaints, no change in neurological examination. Right groin soft, distal pulses 2+ bilaterally. NIR to follow.

## 2019-05-14 NOTE — Anesthesia Procedure Notes (Signed)
Arterial Line Insertion Start/End1/10/2019 9:25 AM, 05/14/2019 9:40 AM Performed by: Sahana Boyland T, Immunologist, CRNA  Patient location: OOR procedure area. Preanesthetic checklist: patient identified, IV checked, site marked, risks and benefits discussed, surgical consent, monitors and equipment checked, pre-op evaluation and timeout performed Patient sedated Left, radial was placed Catheter size: 20 G Hand hygiene performed  and maximum sterile barriers used   Attempts: 4 Procedure performed without using ultrasound guided technique. Following insertion, dressing applied and Biopatch. Post procedure assessment: normal  Patient tolerated the procedure well with no immediate complications.

## 2019-05-14 NOTE — Anesthesia Postprocedure Evaluation (Signed)
Anesthesia Post Note  Patient: Jeffrey [name]  Procedure(s) Performed: IR WITH ANESTHESIA STENTING (N/A )     Patient location during evaluation: PACU Anesthesia Type: General Level of consciousness: awake and alert Pain management: pain level controlled Vital Signs Assessment: post-procedure vital signs reviewed and stable Respiratory status: spontaneous breathing, nonlabored ventilation, respiratory function stable and patient connected to nasal cannula oxygen Cardiovascular status: blood pressure returned to baseline and stable Postop Assessment: no apparent nausea or vomiting Anesthetic complications: no    Last Vitals:  Vitals:   05/14/19 1900 05/14/19 2000  BP: 121/79 (!) 131/91  Pulse: (!) 106 99  Resp: 17 16  Temp:  36.7 C  SpO2: 98% 93%    Last Pain:  Vitals:   05/14/19 2000  TempSrc: Oral  PainSc:                  Freemon Binford COKER

## 2019-05-14 NOTE — Progress Notes (Signed)
Patient ID: Jeffrey [name], male   DOB: Nov 22, 1945, 74 y.o.   MRN: [id number] INR . Post treatment . Extubated . Responds appropriately to simple commands . Denies any H/S,N/V ,visual or motor symptoms. C/O sore throat. Pupils 61m RTv= LT NO facial asymmetry. No pronation drift.Moves all 4s equally  RT groin soft. Distal pulses palpable DP and PT bilaterally. S.Mariateresa Batra MD

## 2019-05-14 NOTE — Progress Notes (Signed)
ANTICOAGULATION CONSULT NOTE - Initial Consult  Pharmacy Consult for heparin Indication: carotid arteriogram followed balloon angioplasty   Allergies  Allergen Reactions  . Baclofen Other (See Comments)    Back pain. Patient said it made him feel crazy   . Efavirenz Rash  . Nevirapine Rash and Other (See Comments)    Patient Measurements: Height: 5' 10"  (177.8 cm) Weight: 173 lb (78.5 kg) IBW/kg (Calculated) : 73   Vital Signs: Temp: 98 F (36.7 C) (01/06 1128) Temp Source: Oral (01/06 0715) BP: 115/92 (01/06 1443) Pulse Rate: 93 (01/06 1443)  Labs: Recent Labs    05/14/19 0645  HGB 14.4  HCT 41.6  PLT 174  APTT 30  LABPROT 12.6  INR 1.0  CREATININE 1.41*    Estimated Creatinine Clearance: 49.6 mL/min (A) (by C-G formula based on SCr of 1.41 mg/dL (H)).   Medical History: Past Medical History:  Diagnosis Date  . Chronic kidney disease   . Colon polyps   . GERD (gastroesophageal reflux disease)   . Heart murmur   . History of kidney stones   . HIV (human immunodeficiency virus infection) ([place]) 1999  . Hypertension   . Pneumonia   . Stroke [facility])    right side weakness  . TIA (transient ischemic attack)     Medications:  Medications Prior to Admission  Medication Sig Dispense Refill Last Dose  . amLODipine (NORVASC) 10 MG tablet Take 1 tablet (10 mg total) by mouth daily. 90 tablet 3 05/14/2019 at 0530  . aspirin EC 81 MG tablet Take 1 tablet (81 mg total) by mouth daily.   05/14/2019 at 0530  . clopidogrel (PLAVIX) 75 MG tablet Take 1 tablet (75 mg total) by mouth daily. 90 tablet 3 05/14/2019 at 0530  . finasteride (PROSCAR) 5 MG tablet Take 5 mg by mouth daily.   05/14/2019 at 0530  . pantoprazole (PROTONIX) 40 MG tablet Take 20 mg by mouth 2 (two) times daily.   05/14/2019 at 0530  . TRIUMEQ 600-50-300 MG tablet Take 1 tablet by mouth once daily (Patient taking differently: Take 1 tablet by mouth daily. ) 30 tablet 5 05/14/2019 at 0530  . Multiple  Vitamins-Minerals (MULTIVITAMIN PO) Take 1 tablet by mouth daily.     . pantoprazole (PROTONIX) 20 MG tablet Take 1 tablet (20 mg total) by mouth 2 (two) times daily. (Patient not taking: Reported on 05/05/2019)   Not Taking at Unknown time      Assessment: 74 yo male with R ICA stenosis s/p  carotid arteriogram followed balloon angioplasty. He is on heparin post procedure at 500 units/hr. He is not taking any anticoagulants at home. -CBC stabe  Goal of Therapy:  Heparin level 0.1-0.25 units/ml Monitor platelets by anticoagulation protocol: Yes   Plan:  -Increase heparin to 750 units/hr -Heparin level in 6 hours and daily wth CBC daily  Hildred Laser, PharmD Clinical Pharmacist **Pharmacist phone directory can now be found on [email or web address] ([email or web address]).  Listed under [place].

## 2019-05-14 NOTE — H&P (Signed)
Chief Complaint: Patient was seen in consultation today for right ICA supraclinoid stenosis/revascularization.  Referring Physician(s): Josue Hector  Supervising Physician: Luanne Bras  Patient Status: [facility] - Out-pt  History of Present Illness: Jeffrey [name] is a 74 y.o. male with a past medical history of hypertension, TIA, CVA, pneumonia, GERD, nephrolithiasis, CKD, and HIV. He is known to [facility] and has been followed by Dr. Estanislado Pandy since 04/2019. He first presented to our department at the request of Dr. Johnsie Cancel for management of intracranial stenosis. He underwent an image-guided diagnostic cerebral arteriogram 04/21/2019 by Dr. Estanislado Pandy which revealed severe high-grade stenosis of the right ICA supraclinoid segment. At that time, patient decided to pursue endovascular revascularization of his right ICA supraclinoid segment to prevent future TIAs/CVAs.  MR/MRA brain/head 04/02/2019: 1. Intracranial atherosclerotic disease with multifocal stenoses, most notably as follows. 2. Moderate/severe focal stenosis within the paraclinoid/supraclinoid right internal carotid artery. 3. Moderate/severe focal stenosis within the proximal right M2 MCA inferior division. 4. Apparent high-grade stenosis within the proximal aspect of a hypoplastic A1 right anterior cerebral artery. 5. Two moderate/severe focal stenoses within the P2 left posterior cerebral artery, one proximal and one distal.  Diagnostic cerebral arteriogram 04/21/2019: 1. Severe high-grade stenosis of the right internal carotid artery supraclinoid segment. 2. Approximately 50% stenosis at the origin of inferior division of the right middle cerebral artery. 3. Focal eccentric fusiform prominence of the left vertebrobasilar junction proximal to the left posterior-inferior cerebellar artery. This may represent a pseudoaneurysm versus a dysplastic vessel.  Patient presents today for possible image-guided cerebral arteriogram  with possible revascularization/angioplasty/stent placement of right ICA supraclinoid segment stenosis. Patient awake and alert sitting in bed with no complaints at this time. Denies fever, chills, chest pain, dyspnea, abdominal pain, or headache.  Currently taking Plavix 75 mg once daily and Aspirin 81 mg once daily.   Past Medical History:  Diagnosis Date  . Chronic kidney disease   . Colon polyps   . GERD (gastroesophageal reflux disease)   . Heart murmur   . History of kidney stones   . HIV (human immunodeficiency virus infection) ([place]) 1999  . Hypertension   . Pneumonia   . Stroke [facility])    right side weakness  . TIA (transient ischemic attack)     Past Surgical History:  Procedure Laterality Date  . CHOLECYSTECTOMY  1982  . COLONOSCOPY    . ESOPHAGEAL MANOMETRY N/A 09/03/2017   Procedure: ESOPHAGEAL MANOMETRY (EM);  Surgeon: Mauri Pole, MD;  Location: [facility] ENDOSCOPY;  Service: Endoscopy;  Laterality: N/A;  . IR ANGIO INTRA EXTRACRAN SEL COM CAROTID INNOMINATE BILAT MOD SED  04/21/2019  . IR ANGIO VERTEBRAL SEL VERTEBRAL BILAT MOD SED  04/21/2019  . KNEE SURGERY     LEFT KNEE  . TONSILLECTOMY      Allergies: Baclofen, Efavirenz, and Nevirapine  Medications: Prior to Admission medications   Medication Sig Start Date End Date Taking? Authorizing Provider  amLODipine (NORVASC) 10 MG tablet Take 1 tablet (10 mg total) by mouth daily. 04/23/19   Josue Hector, MD  aspirin EC 81 MG tablet Take 1 tablet (81 mg total) by mouth daily. 10/11/15   Karamalegos, Devonne Doughty, DO  clopidogrel (PLAVIX) 75 MG tablet Take 1 tablet (75 mg total) by mouth daily. 04/07/19   Josue Hector, MD  finasteride (PROSCAR) 5 MG tablet Take 5 mg by mouth daily.    [provider]  Multiple Vitamins-Minerals (MULTIVITAMIN PO) Take 1 tablet by mouth daily.  [provider]  pantoprazole (PROTONIX) 20 MG tablet Take 1 tablet (20 mg total) by mouth 2 (two) times  daily. Patient not taking: Reported on 05/05/2019 04/23/19   Yetta Flock, MD  pantoprazole (PROTONIX) 40 MG tablet Take 20 mg by mouth 2 (two) times daily.    [provider]  TRIUMEQ 600-50-300 MG tablet Take 1 tablet by mouth once daily Patient taking differently: Take 1 tablet by mouth daily.  03/10/19   [name], MD     Family History  Problem Relation Age of Onset  . Hypertension Mother   . Heart attack Mother 66  . Colon cancer Neg Hx   . Esophageal cancer Neg Hx   . Pancreatic cancer Neg Hx   . Stomach cancer Neg Hx   . Liver disease Neg Hx     Social History   Socioeconomic History  . Marital status: Divorced    Spouse name: Not on file  . Number of children: Not on file  . Years of education: Not on file  . Highest education level: Not on file  Occupational History  . Occupation: retired    Fish farm manager: [employer]  Tobacco Use  . Smoking status: Never Smoker  . Smokeless tobacco: Former Systems developer    Types: Chew  . Tobacco comment: Rare use, about q 6 months  Substance and Sexual Activity  . Alcohol use: Yes    Comment: rare  . Drug use: No  . Sexual activity: Yes    Partners: Female    Comment: declined condoms  Other Topics Concern  . Not on file  Social History Narrative  . Not on file   Social Determinants of Health   Financial Resource Strain:   . Difficulty of Paying Living Expenses: Not on file  Food Insecurity:   . Worried About Charity fundraiser in the Last Year: Not on file  . Ran Out of Food in the Last Year: Not on file  Transportation Needs:   . Lack of Transportation (Medical): Not on file  . Lack of Transportation (Non-Medical): Not on file  Physical Activity:   . Days of Exercise per Week: Not on file  . Minutes of Exercise per Session: Not on file  Stress:   . Feeling of Stress : Not on file  Social Connections:   . Frequency of Communication with Friends and Family: Not on file  . Frequency of Social Gatherings  with Friends and Family: Not on file  . Attends Religious Services: Not on file  . Active Member of Clubs or Organizations: Not on file  . Attends Archivist Meetings: Not on file  . Marital Status: Not on file     Review of Systems: A 12 point ROS discussed and pertinent positives are indicated in the HPI above.  All other systems are negative.  Review of Systems  Constitutional: Negative for chills and fever.  Respiratory: Negative for shortness of breath and wheezing.   Cardiovascular: Negative for chest pain and palpitations.  Gastrointestinal: Negative for abdominal pain.  Neurological: Negative for headaches.  Psychiatric/Behavioral: Negative for behavioral problems and confusion.    Vital Signs: There were no vitals taken for this visit.  Physical Exam Vitals and nursing note reviewed.  Constitutional:      General: He is not in acute distress.    Appearance: Normal appearance.  Cardiovascular:     Rate and Rhythm: Normal rate and regular rhythm.     Heart sounds: Normal  heart sounds. No murmur.  Pulmonary:     Effort: Pulmonary effort is normal. No respiratory distress.     Breath sounds: Normal breath sounds.  Skin:    General: Skin is warm and dry.  Neurological:     Mental Status: He is alert and oriented to person, place, and time.  Psychiatric:        Mood and Affect: Mood normal.        Behavior: Behavior normal.      MD Evaluation Airway: WNL Heart: WNL Abdomen: WNL Chest/ Lungs: WNL ASA  Classification: 2 Mallampati/Airway Score: Two   Imaging: IR ANGIO INTRA EXTRACRAN SEL COM CAROTID INNOMINATE BILAT MOD SED  Result Date: 04/23/2019 CLINICAL DATA:  Left sided paresthesias and intermittent weakness. MRI of the brain revealing high-grade stenosis of the right internal carotid artery supraclinoid segment. EXAM: BILATERAL COMMON CAROTID AND INNOMINATE ANGIOGRAPHY COMPARISON:  MRI MRA brain of April 02, 2019. MEDICATIONS: Heparin 1000  units IV; no antibiotic was administered within 1 hour of the procedure. ANESTHESIA/SEDATION: Versed 1 mg IV; Fentanyl 25 mcg IV Moderate Sedation Time:  52 minutes The patient was continuously monitored during the procedure by the interventional radiology nurse under my direct supervision. CONTRAST:  Isovue 300 approximately 60 mL. FLUOROSCOPY TIME:  Fluoroscopy Time: 12 minutes 24 seconds (1189 mGy). COMPLICATIONS: None immediate. TECHNIQUE: Informed written consent was obtained from the patient after a thorough discussion of the procedural risks, benefits and alternatives. All questions were addressed. Maximal Sterile Barrier Technique was utilized including caps, mask, sterile gowns, sterile gloves, sterile drape, hand hygiene and skin antiseptic. A timeout was performed prior to the initiation of the procedure. Initially, the right radial artery was palpated. Its morphology was assessed with ultrasound guidance and documented. Because of the small diameter of the radial artery this approach was abandoned. The right groin was prepped and draped in the usual sterile fashion. Thereafter using modified Seldinger technique, transfemoral access into the right common femoral artery was obtained without difficulty. Over a 0.035 inch guidewire, a 5 French Pinnacle sheath was inserted. Through this, and also over 0.035 inch guidewire, a 5 [place] JB 1 catheter was advanced to the aortic arch region and selectively positioned in the right common carotid artery, the right vertebral artery, the left common carotid artery and the left vertebral artery. FINDINGS: The innominate artery angiogram demonstrates the origin of the right common carotid artery and the right subclavian arteries to be widely patent. The right common carotid arteriogram demonstrates the right external carotid artery and its major branches to be widely patent. The right internal carotid artery at the bulb has a smooth shallow plaque along the  posterolateral wall without associated stenosis or ulcerations. More distally, the right internal carotid artery is seen to opacify to the cranial skull base. The petrous and proximal cavernous segments are widely patent. There is a focal tapered severe high-grade stenosis of the supraclinoid right ICA. Distal to this the supraclinoid right ICA at the terminus appears widely patent. The right middle cerebral artery opacifies into the capillary and venous phases. There is a 50% stenosis of the origin of the inferior division of the right middle cerebral artery. No angiographic opacification of the right anterior cerebral A1 segment is seen. The origin of the right vertebral artery is widely patent. The vessel is seen to opacify to the cranial skull base. Wide patency is seen of the right vertebrobasilar junction and the right posterior-inferior cerebellar artery. The opacified portion of the basilar artery,  the posterior cerebral arteries, the superior cerebellar arteries and the anterior-inferior cerebellar arteries is noted into the capillary and venous phases. Unopacified blood is seen in the basilar artery from the contralateral vertebral artery. Mixing of unopacified blood is seen in both posterior cerebral arteries. The left common carotid arteriogram demonstrates the left external carotid artery and its major branches to be widely patent. The left internal carotid artery at the bulb to the cranial skull base demonstrates wide patency. The petrous, cavernous and supraclinoid segments are widely patent. The left middle cerebral artery and the left anterior cerebral artery opacify into the capillary and venous phases. Cross-filling via the anterior communicating artery of the right anterior cerebral A2 segment and distally is seen. Suggestion of a hypoplastic right anterior cerebral artery distal A1 is noted. The origin the left vertebral artery is widely patent. The vessel is seen to opacify to the cranial skull  base. Wide patency is seen of the left vertebrobasilar junction and the left posterior-inferior cerebellar artery. The opacified portion of the basilar artery, the posterior cerebral arteries, the superior cerebellar arteries and the anterior-inferior cerebellar arteries demonstrate wide patency. A focal eccentric fusiform dilatation of the left vertebral artery proximal to the left posterior-inferior cerebellar artery is seen. This measures approximately 5.5 mm at its widest dimension. IMPRESSION: Severe high-grade stenosis of the right internal carotid artery supraclinoid segment. Approximately 50% stenosis at the origin of inferior division of the right middle cerebral artery. Focal eccentric fusiform prominence of the left vertebrobasilar junction proximal to the left posterior-inferior cerebellar artery. This may represent a pseudoaneurysm versus a dysplastic vessel. PLAN: Findings were reviewed with the patient. Given the symptomatic nature of the right internal carotid supraclinoid segment, option of endovascular revascularization to prevent further ischemic events versus complete occlusion was reviewed, in addition to continued aggressive dual anti-platelet therapy. The patient has decided for now to proceed with endovascular revascularization. This will be scheduled at the earliest. In the meantime, the patient was advised to call [phone] should his symptoms recur. He expressed understanding and agreement with the above management plan. Electronically Signed   By: Luanne Bras M.D.   On: 04/22/2019 09:18   IR ANGIO VERTEBRAL SEL VERTEBRAL BILAT MOD SED  Result Date: 04/21/2019 CLINICAL DATA:  Left sided paresthesias and intermittent weakness. MRI of the brain revealing high-grade stenosis of the right internal carotid artery supraclinoid segment.  EXAM: BILATERAL COMMON CAROTID AND INNOMINATE ANGIOGRAPHY  COMPARISON:  MRI MRA brain of April 02, 2019.  MEDICATIONS: Heparin 1000 units IV; no  antibiotic was administered within 1 hour of the procedure.  ANESTHESIA/SEDATION: Versed 1 mg IV; Fentanyl 25 mcg IV  Moderate Sedation Time:  52 minutes  The patient was continuously monitored during the procedure by the interventional radiology nurse under my direct supervision.  CONTRAST:  Isovue 300 approximately 60 mL.  FLUOROSCOPY TIME:  Fluoroscopy Time: 12 minutes 24 seconds (1189 mGy).  COMPLICATIONS: None immediate.  TECHNIQUE: Informed written consent was obtained from the patient after a thorough discussion of the procedural risks, benefits and alternatives. All questions were addressed. Maximal Sterile Barrier Technique was utilized including caps, mask, sterile gowns, sterile gloves, sterile drape, hand hygiene and skin antiseptic. A timeout was performed prior to the initiation of the procedure.  Initially, the right radial artery was palpated. Its morphology was assessed with ultrasound guidance and documented. Because of the small diameter of the radial artery this approach was abandoned.  The right groin was prepped and draped in the usual  sterile fashion. Thereafter using modified Seldinger technique, transfemoral access into the right common femoral artery was obtained without difficulty. Over a 0.035 inch guidewire, a 5 French Pinnacle sheath was inserted. Through this, and also over 0.035 inch guidewire, a 5 [place] JB 1 catheter was advanced to the aortic arch region and selectively positioned in the right common carotid artery, the right vertebral artery, the left common carotid artery and the left vertebral artery.  FINDINGS: The innominate artery angiogram demonstrates the origin of the right common carotid artery and the right subclavian arteries to be widely patent.  The right common carotid arteriogram demonstrates the right external carotid artery and its major branches to be widely patent.  The right internal carotid artery at the bulb has a smooth shallow plaque along the  posterolateral wall without associated stenosis or ulcerations.  More distally, the right internal carotid artery is seen to opacify to the cranial skull base. The petrous and proximal cavernous segments are widely patent.  There is a focal tapered severe high-grade stenosis of the supraclinoid right ICA.  Distal to this the supraclinoid right ICA at the terminus appears widely patent.  The right middle cerebral artery opacifies into the capillary and venous phases.  There is a 50% stenosis of the origin of the inferior division of the right middle cerebral artery.  No angiographic opacification of the right anterior cerebral A1 segment is seen.  The origin of the right vertebral artery is widely patent.  The vessel is seen to opacify to the cranial skull base. Wide patency is seen of the right vertebrobasilar junction and the right posterior-inferior cerebellar artery.  The opacified portion of the basilar artery, the posterior cerebral arteries, the superior cerebellar arteries and the anterior-inferior cerebellar arteries is noted into the capillary and venous phases.  Unopacified blood is seen in the basilar artery from the contralateral vertebral artery.  Mixing of unopacified blood is seen in both posterior cerebral arteries. The left common carotid arteriogram demonstrates the left external carotid artery and its major branches to be widely patent.  The left internal carotid artery at the bulb to the cranial skull base demonstrates wide patency.  The petrous, cavernous and supraclinoid segments are widely patent.  The left middle cerebral artery and the left anterior cerebral artery opacify into the capillary and venous phases.  Cross-filling via the anterior communicating artery of the right anterior cerebral A2 segment and distally is seen. Suggestion of a hypoplastic right anterior cerebral artery distal A1 is noted. The origin the left vertebral artery is widely patent.  The vessel is seen  to opacify to the cranial skull base. Wide patency is seen of the left vertebrobasilar junction and the left posterior-inferior cerebellar artery.  The opacified portion of the basilar artery, the posterior cerebral arteries, the superior cerebellar arteries and the anterior-inferior cerebellar arteries demonstrate wide patency.  A focal eccentric fusiform dilatation of the left vertebral artery proximal to the left posterior-inferior cerebellar artery is seen.  This measures approximately 5.5 mm at its widest dimension.  IMPRESSION: Severe high-grade stenosis of the right internal carotid artery supraclinoid segment.  Approximately 50% stenosis at the origin of inferior division of the right middle cerebral artery.  Focal eccentric fusiform prominence of the left vertebrobasilar junction proximal to the left posterior-inferior cerebellar artery. This may represent a pseudoaneurysm versus a dysplastic vessel.  PLAN: Findings were reviewed with the patient. Given the symptomatic nature of the right internal carotid supraclinoid segment, option of endovascular revascularization to  prevent further ischemic events versus complete occlusion was reviewed, in addition to continued aggressive dual anti-platelet therapy. The patient has decided for now to proceed with endovascular revascularization. This will be scheduled at the earliest.  In the meantime, the patient was advised to call 911 should his symptoms recur. He expressed understanding and agreement with the above management plan.   Electronically Signed   By: Luanne Bras M.D.   On: 04/22/2019 09:18    Labs:  CBC: Recent Labs    07/25/18 0836 02/10/19 0911 04/21/19 0654 05/14/19 0645  WBC 7.5 5.9 6.4 5.3  HGB 13.5 13.7 13.9 14.4  HCT 38.3* 39.8 41.1 41.6  PLT 209 187 208 174    COAGS: Recent Labs    04/21/19 0654 05/14/19 0645  INR 0.9 1.0  APTT  --  30    BMP: Recent Labs    07/25/18 0836 11/26/18 1638 02/10/19 0911  02/27/19 1638 04/21/19 0654 05/14/19 0645  NA  --  140 141 141 142 139  K   < > 4.8 4.3 4.3 3.5 3.6  CL   < > 105 108 106 109 107  CO2   < > 23 28 23 24 24   GLUCOSE  --  99 76 102* 107* 103*  BUN  --  27 31* 27 21 29*  CALCIUM   < > 9.4 9.2 9.2 9.3 9.2  CREATININE  --  1.47* 1.53* 1.48* 1.56* 1.41*  GFRNONAA  --  48*  --  47* 44* 50*  GFRAA  --  55*  --  54* 51* 58*   < > = values in this interval not displayed.    LIVER FUNCTION TESTS: Recent Labs    07/25/18 0836 11/26/18 1638 02/10/19 0911  BILITOT 0.4 0.3 0.4  AST 24 18 17   ALT 23 16 18   ALKPHOS  --  77  --   PROT 6.6 6.6 6.3  ALBUMIN  --  4.3  --       Assessment and Plan:  Right ICA supraclinoid segment stenosis. Plan for image-guided cerebral arteriogram with possible revascularization/angioplasty/stent placement of right ICA supraclinoid segment stenosis today with Dr. Estanislado Pandy. Patient is NPO. Afebrile and WBCs WNL. Ok to proceed with Plavix/Aspirin use per Dr. Estanislado Pandy. P2Y12 162 PRU today- per Dr. Estanislado Pandy patient to take an additional Plavix 75 mg today and proceed (patient to have Plavix 150 mg today). COVID negative 05/10/2019. UA unremarkable for active infection.  Risks and benefits of cerebral arteriogram with intervention were discussed with the patient including, but not limited to bleeding, infection, vascular injury, contrast induced renal failure, stroke, reperfusion hemorrhage, or even death. This interventional procedure involves the use of X-rays and because of the nature of the planned procedure, it is possible that we will have prolonged use of X-ray fluoroscopy. Potential radiation risks to you include (but are not limited to) the following: - A slightly elevated risk for cancer  several years later in life. This risk is typically less than 0.5% percent. This risk is low in comparison to the normal incidence of human cancer, which is 33% for women and 50% for men according to the [place]. - Radiation induced injury can include skin redness, resembling a rash, tissue breakdown / ulcers and hair loss (which can be temporary or permanent).  The likelihood of either of these occurring depends on the difficulty of the procedure and whether you are sensitive to radiation due to previous procedures, disease, or genetic conditions.  IF your procedure requires a prolonged use of radiation, you will be notified and given written instructions for further action.  It is your responsibility to monitor the irradiated area for the 2 weeks following the procedure and to notify your physician if you are concerned that you have suffered a radiation induced injury.  All of the patient's questions were answered, patient is agreeable to proceed. Consent signed and in chart.   Thank you for this interesting consult.  I greatly enjoyed meeting Jeffrey [name] and look forward to participating in their care.  A copy of this report was sent to the requesting provider on this date.  Electronically Signed: Earley Abide, PA-C 05/14/2019, 8:31 AM   I spent a total of 40 Minutes in face to face in clinical consultation, greater than 50% of which was counseling/coordinating care for right ICA supraclinoid stenosis/revascularization.

## 2019-05-14 NOTE — Transfer of Care (Signed)
Immediate Anesthesia Transfer of Care Note  Patient: Jeffrey [name]  Procedure(s) Performed: IR WITH ANESTHESIA STENTING (N/A )  Patient Location: PACU  Anesthesia Type:General  Level of Consciousness: awake, alert  and oriented  Airway & Oxygen Therapy: Patient Spontanous Breathing and Patient connected to nasal cannula oxygen  Post-op Assessment: Report given to RN, Post -op Vital signs reviewed and stable and Patient moving all extremities  Post vital signs: Reviewed and stable  Last Vitals:  Vitals Value Taken Time  BP 123/80 05/14/19 1128  Temp    Pulse    Resp 15 05/14/19 1130  SpO2    Vitals shown include unvalidated device data.  Last Pain:  Vitals:   05/14/19 0715  TempSrc: Oral  PainSc:          Complications: No apparent anesthesia complications

## 2019-05-14 NOTE — Procedures (Addendum)
S/P RT common carotid arteriogram followed balloon angioplasty of supraclinoid  RT ICA segment  for severe stenosis with residual stenosis of approx 20 % S.Edyn Popoca MD

## 2019-05-14 NOTE — Anesthesia Procedure Notes (Signed)
Procedure Name: Intubation Date/Time: 05/14/2019 9:19 AM Performed by: Chrystine Frogge T, CRNA Pre-anesthesia Checklist: Patient identified, Emergency Drugs available, Suction available and Patient being monitored Patient Re-evaluated:Patient Re-evaluated prior to induction Oxygen Delivery Method: Circle system utilized Preoxygenation: Pre-oxygenation with 100% oxygen Induction Type: IV induction Ventilation: Mask ventilation without difficulty, Oral airway inserted - appropriate to patient size and Two handed mask ventilation required Laryngoscope Size: Miller and 3 Grade View: Grade I Tube type: Oral Tube size: 7.5 mm Number of attempts: 1 Airway Equipment and Method: Stylet and Oral airway Placement Confirmation: ETT inserted through vocal cords under direct vision,  positive ETCO2 and breath sounds checked- equal and bilateral Secured at: 22 cm Tube secured with: Tape Dental Injury: Teeth and Oropharynx as per pre-operative assessment

## 2019-05-15 ENCOUNTER — Encounter: Payer: Self-pay | Admitting: *Deleted

## 2019-05-15 LAB — CBC WITH DIFFERENTIAL/PLATELET
Abs Immature Granulocytes: 0.02 10*3/uL (ref 0.00–0.07)
Basophils Absolute: 0 10*3/uL (ref 0.0–0.1)
Basophils Relative: 0 %
Eosinophils Absolute: 0 10*3/uL (ref 0.0–0.5)
Eosinophils Relative: 0 %
HCT: 35.4 % — ABNORMAL LOW (ref 39.0–52.0)
Hemoglobin: 12.4 g/dL — ABNORMAL LOW (ref 13.0–17.0)
Immature Granulocytes: 0 %
Lymphocytes Relative: 20 %
Lymphs Abs: 1.6 10*3/uL (ref 0.7–4.0)
MCH: 34.7 pg — ABNORMAL HIGH (ref 26.0–34.0)
MCHC: 35 g/dL (ref 30.0–36.0)
MCV: 99.2 fL (ref 80.0–100.0)
Monocytes Absolute: 0.6 10*3/uL (ref 0.1–1.0)
Monocytes Relative: 8 %
Neutro Abs: 5.7 10*3/uL (ref 1.7–7.7)
Neutrophils Relative %: 72 %
Platelets: 189 10*3/uL (ref 150–400)
RBC: 3.57 MIL/uL — ABNORMAL LOW (ref 4.22–5.81)
RDW: 12.3 % (ref 11.5–15.5)
WBC: 8 10*3/uL (ref 4.0–10.5)
nRBC: 0 % (ref 0.0–0.2)

## 2019-05-15 LAB — BASIC METABOLIC PANEL
Anion gap: 10 (ref 5–15)
BUN: 31 mg/dL — ABNORMAL HIGH (ref 8–23)
CO2: 23 mmol/L (ref 22–32)
Calcium: 8.6 mg/dL — ABNORMAL LOW (ref 8.9–10.3)
Chloride: 107 mmol/L (ref 98–111)
Creatinine, Ser: 1.38 mg/dL — ABNORMAL HIGH (ref 0.61–1.24)
GFR calc Af Amer: 59 mL/min — ABNORMAL LOW (ref >60)
GFR calc non Af Amer: 51 mL/min — ABNORMAL LOW (ref >60)
Glucose, Bld: 128 mg/dL — ABNORMAL HIGH (ref 70–99)
Potassium: 3.6 mmol/L (ref 3.5–5.1)
Sodium: 140 mmol/L (ref 135–145)

## 2019-05-15 MED ORDER — HEPARIN (PORCINE) 25000 UT/250ML-% IV SOLN: 600.0000 [IU]/h | INTRAVENOUS | Status: DC

## 2019-05-15 NOTE — Progress Notes (Signed)
Discharge instructions were given to patient. All belongings returned to pt, will be going home with son.

## 2019-05-15 NOTE — Progress Notes (Signed)
ANTICOAGULATION CONSULT NOTE - Follow-Up Consult  Pharmacy Consult for heparin Indication: carotid arteriogram followed balloon angioplasty   Allergies  Allergen Reactions  . Baclofen Other (See Comments)    Back pain. Patient said it made him feel crazy   . Efavirenz Rash  . Nevirapine Rash and Other (See Comments)    Patient Measurements: Height: 5' 10"  (177.8 cm) Weight: 161 lb 6 oz (73.2 kg) IBW/kg (Calculated) : 73   Vital Signs: Temp: 98 F (36.7 C) (01/06 2000) Temp Source: Oral (01/06 2000) BP: 131/91 (01/06 2000) Pulse Rate: 99 (01/06 2000)  Labs: Recent Labs    05/14/19 0645 05/14/19 2306  HGB 14.4  --   HCT 41.6  --   PLT 174  --   APTT 30  --   LABPROT 12.6  --   INR 1.0  --   HEPARINUNFRC  --  0.43  CREATININE 1.41*  --     Estimated Creatinine Clearance: 49.6 mL/min (A) (by C-G formula based on SCr of 1.41 mg/dL (H)).   Medical History: Past Medical History:  Diagnosis Date  . Chronic kidney disease   . Colon polyps   . GERD (gastroesophageal reflux disease)   . Heart murmur   . History of kidney stones   . HIV (human immunodeficiency virus infection) ([place]) 1999  . Hypertension   . Pneumonia   . Stroke [facility]    right side weakness  . TIA (transient ischemic attack)     Medications:  Medications Prior to Admission  Medication Sig Dispense Refill Last Dose  . amLODipine (NORVASC) 10 MG tablet Take 1 tablet (10 mg total) by mouth daily. 90 tablet 3 05/14/2019 at 0530  . aspirin EC 81 MG tablet Take 1 tablet (81 mg total) by mouth daily.   05/14/2019 at 0530  . clopidogrel (PLAVIX) 75 MG tablet Take 1 tablet (75 mg total) by mouth daily. 90 tablet 3 05/14/2019 at 0530  . finasteride (PROSCAR) 5 MG tablet Take 5 mg by mouth daily.   05/14/2019 at 0530  . pantoprazole (PROTONIX) 40 MG tablet Take 20 mg by mouth 2 (two) times daily.   05/14/2019 at 0530  . TRIUMEQ 600-50-300 MG tablet Take 1 tablet by mouth once daily (Patient taking differently: Take  1 tablet by mouth daily. ) 30 tablet 5 05/14/2019 at 0530  . Multiple Vitamins-Minerals (MULTIVITAMIN PO) Take 1 tablet by mouth daily.     . pantoprazole (PROTONIX) 20 MG tablet Take 1 tablet (20 mg total) by mouth 2 (two) times daily. (Patient not taking: Reported on 05/05/2019)   Not Taking at Unknown time   . abacavir-dolutegravir-lamiVUDine  1 tablet Oral Daily  . amLODipine  10 mg Oral Daily  . aspirin  325 mg Oral Daily   Or  . aspirin  324 mg Per Tube Daily  . Chlorhexidine Gluconate Cloth  6 each Topical Daily  . clopidogrel  75 mg Oral Daily   Or  . clopidogrel  75 mg Per Tube Daily  . finasteride  5 mg Oral Daily  . pantoprazole  20 mg Oral BID    Assessment: 74 yo male with R ICA stenosis s/p carotid arteriogram followed balloon angioplasty. He is on heparin post procedure at 750 units/hr. Heparin level is above goal.  No bleeding noted.  Heparin to stop 1/7 at 0800.  Goal of Therapy:  Heparin level 0.1-0.25 units/ml Monitor platelets by anticoagulation protocol: Yes   Plan:  -Reducce heparin to 600 units/hr - stop  at 0800. -No further levels anticipated.   [employer], Pharm.D., BCPS Clinical Pharmacist  **Pharmacist phone directory can be found on [email or web address] listed under [place].  05/15/2019 12:29 AM

## 2019-05-15 NOTE — Discharge Summary (Signed)
Patient ID: Jeffrey [name] MRN: [id number] DOB/AGE: 1945-12-26 74 y.o.  Admit date: 05/14/2019 Discharge date: 05/15/2019  Supervising Physician: Luanne Bras  Patient Status: [facility] - In-pt  Admission Diagnoses: Right internal carotid stenosis  Discharge Diagnoses:  Active Problems:   Internal carotid artery stenosis, right   Discharged Condition: good  Hospital Course: 74 y/o M who presented to [facility] on 05/14/19 for a scheduled cerebral angiogram with possible intervention. He underwent successful image guided cerebral angiogram with right ICA supraclinoid angioplasty with Dr. Estanislado Pandy and was admitted for overnight observation. He had an uncomplicated post procedure hospital course and is ready for discharge. He denies any complaints today, tolerating PO intake, urinating without difficulty, OOB without issue. He has several family members who will be helping care for him at home. He states understanding of post procedure instructions and 2 week follow up with NIR. Reviewed return precautions today which patient stated understanding to.   Consults: None  Significant Diagnostic Studies: IR ANGIO INTRA EXTRACRAN SEL COM CAROTID INNOMINATE BILAT MOD SED  Result Date: 04/23/2019 CLINICAL DATA:  Left sided paresthesias and intermittent weakness. MRI of the brain revealing high-grade stenosis of the right internal carotid artery supraclinoid segment. EXAM: BILATERAL COMMON CAROTID AND INNOMINATE ANGIOGRAPHY COMPARISON:  MRI MRA brain of April 02, 2019. MEDICATIONS: Heparin 1000 units IV; no antibiotic was administered within 1 hour of the procedure. ANESTHESIA/SEDATION: Versed 1 mg IV; Fentanyl 25 mcg IV Moderate Sedation Time:  52 minutes The patient was continuously monitored during the procedure by the interventional radiology nurse under my direct supervision. CONTRAST:  Isovue 300 approximately 60 mL. FLUOROSCOPY TIME:  Fluoroscopy Time: 12 minutes 24 seconds (1189 mGy).  COMPLICATIONS: None immediate. TECHNIQUE: Informed written consent was obtained from the patient after a thorough discussion of the procedural risks, benefits and alternatives. All questions were addressed. Maximal Sterile Barrier Technique was utilized including caps, mask, sterile gowns, sterile gloves, sterile drape, hand hygiene and skin antiseptic. A timeout was performed prior to the initiation of the procedure. Initially, the right radial artery was palpated. Its morphology was assessed with ultrasound guidance and documented. Because of the small diameter of the radial artery this approach was abandoned. The right groin was prepped and draped in the usual sterile fashion. Thereafter using modified Seldinger technique, transfemoral access into the right common femoral artery was obtained without difficulty. Over a 0.035 inch guidewire, a 5 French Pinnacle sheath was inserted. Through this, and also over 0.035 inch guidewire, a 5 [place] JB 1 catheter was advanced to the aortic arch region and selectively positioned in the right common carotid artery, the right vertebral artery, the left common carotid artery and the left vertebral artery. FINDINGS: The innominate artery angiogram demonstrates the origin of the right common carotid artery and the right subclavian arteries to be widely patent. The right common carotid arteriogram demonstrates the right external carotid artery and its major branches to be widely patent. The right internal carotid artery at the bulb has a smooth shallow plaque along the posterolateral wall without associated stenosis or ulcerations. More distally, the right internal carotid artery is seen to opacify to the cranial skull base. The petrous and proximal cavernous segments are widely patent. There is a focal tapered severe high-grade stenosis of the supraclinoid right ICA. Distal to this the supraclinoid right ICA at the terminus appears widely patent. The right middle cerebral artery  opacifies into the capillary and venous phases. There is a 50% stenosis of the origin of the  inferior division of the right middle cerebral artery. No angiographic opacification of the right anterior cerebral A1 segment is seen. The origin of the right vertebral artery is widely patent. The vessel is seen to opacify to the cranial skull base. Wide patency is seen of the right vertebrobasilar junction and the right posterior-inferior cerebellar artery. The opacified portion of the basilar artery, the posterior cerebral arteries, the superior cerebellar arteries and the anterior-inferior cerebellar arteries is noted into the capillary and venous phases. Unopacified blood is seen in the basilar artery from the contralateral vertebral artery. Mixing of unopacified blood is seen in both posterior cerebral arteries. The left common carotid arteriogram demonstrates the left external carotid artery and its major branches to be widely patent. The left internal carotid artery at the bulb to the cranial skull base demonstrates wide patency. The petrous, cavernous and supraclinoid segments are widely patent. The left middle cerebral artery and the left anterior cerebral artery opacify into the capillary and venous phases. Cross-filling via the anterior communicating artery of the right anterior cerebral A2 segment and distally is seen. Suggestion of a hypoplastic right anterior cerebral artery distal A1 is noted. The origin the left vertebral artery is widely patent. The vessel is seen to opacify to the cranial skull base. Wide patency is seen of the left vertebrobasilar junction and the left posterior-inferior cerebellar artery. The opacified portion of the basilar artery, the posterior cerebral arteries, the superior cerebellar arteries and the anterior-inferior cerebellar arteries demonstrate wide patency. A focal eccentric fusiform dilatation of the left vertebral artery proximal to the left posterior-inferior cerebellar  artery is seen. This measures approximately 5.5 mm at its widest dimension. IMPRESSION: Severe high-grade stenosis of the right internal carotid artery supraclinoid segment. Approximately 50% stenosis at the origin of inferior division of the right middle cerebral artery. Focal eccentric fusiform prominence of the left vertebrobasilar junction proximal to the left posterior-inferior cerebellar artery. This may represent a pseudoaneurysm versus a dysplastic vessel. PLAN: Findings were reviewed with the patient. Given the symptomatic nature of the right internal carotid supraclinoid segment, option of endovascular revascularization to prevent further ischemic events versus complete occlusion was reviewed, in addition to continued aggressive dual anti-platelet therapy. The patient has decided for now to proceed with endovascular revascularization. This will be scheduled at the earliest. In the meantime, the patient was advised to call [phone] should his symptoms recur. He expressed understanding and agreement with the above management plan. Electronically Signed   By: Luanne Bras M.D.   On: 04/22/2019 09:18   IR ANGIO VERTEBRAL SEL VERTEBRAL BILAT MOD SED  Result Date: 04/21/2019 CLINICAL DATA:  Left sided paresthesias and intermittent weakness. MRI of the brain revealing high-grade stenosis of the right internal carotid artery supraclinoid segment.  EXAM: BILATERAL COMMON CAROTID AND INNOMINATE ANGIOGRAPHY  COMPARISON:  MRI MRA brain of April 02, 2019.  MEDICATIONS: Heparin 1000 units IV; no antibiotic was administered within 1 hour of the procedure.  ANESTHESIA/SEDATION: Versed 1 mg IV; Fentanyl 25 mcg IV  Moderate Sedation Time:  52 minutes  The patient was continuously monitored during the procedure by the interventional radiology nurse under my direct supervision.  CONTRAST:  Isovue 300 approximately 60 mL.  FLUOROSCOPY TIME:  Fluoroscopy Time: 12 minutes 24 seconds (1189 mGy).  COMPLICATIONS:  None immediate.  TECHNIQUE: Informed written consent was obtained from the patient after a thorough discussion of the procedural risks, benefits and alternatives. All questions were addressed. Maximal Sterile Barrier Technique was utilized including caps, mask,  sterile gowns, sterile gloves, sterile drape, hand hygiene and skin antiseptic. A timeout was performed prior to the initiation of the procedure.  Initially, the right radial artery was palpated. Its morphology was assessed with ultrasound guidance and documented. Because of the small diameter of the radial artery this approach was abandoned.  The right groin was prepped and draped in the usual sterile fashion. Thereafter using modified Seldinger technique, transfemoral access into the right common femoral artery was obtained without difficulty. Over a 0.035 inch guidewire, a 5 French Pinnacle sheath was inserted. Through this, and also over 0.035 inch guidewire, a 5 [place] JB 1 catheter was advanced to the aortic arch region and selectively positioned in the right common carotid artery, the right vertebral artery, the left common carotid artery and the left vertebral artery.  FINDINGS: The innominate artery angiogram demonstrates the origin of the right common carotid artery and the right subclavian arteries to be widely patent.  The right common carotid arteriogram demonstrates the right external carotid artery and its major branches to be widely patent.  The right internal carotid artery at the bulb has a smooth shallow plaque along the posterolateral wall without associated stenosis or ulcerations.  More distally, the right internal carotid artery is seen to opacify to the cranial skull base. The petrous and proximal cavernous segments are widely patent.  There is a focal tapered severe high-grade stenosis of the supraclinoid right ICA.  Distal to this the supraclinoid right ICA at the terminus appears widely patent.  The right middle cerebral  artery opacifies into the capillary and venous phases.  There is a 50% stenosis of the origin of the inferior division of the right middle cerebral artery.  No angiographic opacification of the right anterior cerebral A1 segment is seen.  The origin of the right vertebral artery is widely patent.  The vessel is seen to opacify to the cranial skull base. Wide patency is seen of the right vertebrobasilar junction and the right posterior-inferior cerebellar artery.  The opacified portion of the basilar artery, the posterior cerebral arteries, the superior cerebellar arteries and the anterior-inferior cerebellar arteries is noted into the capillary and venous phases.  Unopacified blood is seen in the basilar artery from the contralateral vertebral artery.  Mixing of unopacified blood is seen in both posterior cerebral arteries. The left common carotid arteriogram demonstrates the left external carotid artery and its major branches to be widely patent.  The left internal carotid artery at the bulb to the cranial skull base demonstrates wide patency.  The petrous, cavernous and supraclinoid segments are widely patent.  The left middle cerebral artery and the left anterior cerebral artery opacify into the capillary and venous phases.  Cross-filling via the anterior communicating artery of the right anterior cerebral A2 segment and distally is seen. Suggestion of a hypoplastic right anterior cerebral artery distal A1 is noted. The origin the left vertebral artery is widely patent.  The vessel is seen to opacify to the cranial skull base. Wide patency is seen of the left vertebrobasilar junction and the left posterior-inferior cerebellar artery.  The opacified portion of the basilar artery, the posterior cerebral arteries, the superior cerebellar arteries and the anterior-inferior cerebellar arteries demonstrate wide patency.  A focal eccentric fusiform dilatation of the left vertebral artery proximal to the  left posterior-inferior cerebellar artery is seen.  This measures approximately 5.5 mm at its widest dimension.  IMPRESSION: Severe high-grade stenosis of the right internal carotid artery supraclinoid segment.  Approximately 50%  stenosis at the origin of inferior division of the right middle cerebral artery.  Focal eccentric fusiform prominence of the left vertebrobasilar junction proximal to the left posterior-inferior cerebellar artery. This may represent a pseudoaneurysm versus a dysplastic vessel.  PLAN: Findings were reviewed with the patient. Given the symptomatic nature of the right internal carotid supraclinoid segment, option of endovascular revascularization to prevent further ischemic events versus complete occlusion was reviewed, in addition to continued aggressive dual anti-platelet therapy. The patient has decided for now to proceed with endovascular revascularization. This will be scheduled at the earliest.  In the meantime, the patient was advised to call [phone] should his symptoms recur. He expressed understanding and agreement with the above management plan.   Electronically Signed   By: Luanne Bras M.D.   On: 04/22/2019 09:18    Treatments: IV hydration  Discharge Exam: Blood pressure 116/72, pulse (!) 106, temperature 98.1 F (36.7 C), temperature source Axillary, resp. rate 19, height 5' 10"  (6.503 m), weight 161 lb 6 oz (73.2 kg), SpO2 92 %. Physical Exam Vitals and nursing note reviewed.  Constitutional:      General: He is not in acute distress.    Appearance: He is not ill-appearing.     Comments: Pleasant, talkative, good historian.   HENT:     Head: Normocephalic.  Cardiovascular:     Rate and Rhythm: Normal rate.  Pulmonary:     Effort: Pulmonary effort is normal.  Abdominal:     General: There is no distension.     Palpations: Abdomen is soft.     Tenderness: There is no abdominal tenderness.  Skin:    General: Skin is warm and dry.     Comments:  (+) right CFA puncture site clean, dry, small amount of dried blood on dressing, no active bleeding. Soft, non tender, no hematoma noted.   Neurological:     Mental Status: He is alert and oriented to person, place, and time.  Psychiatric:        Mood and Affect: Mood normal.        Behavior: Behavior normal.        Thought Content: Thought content normal.        Judgment: Judgment normal.   Alert, awake, and oriented x4 Speech and comprehension intact PERRL bilaterally EOMs without nystagmus or subjective diplopia. Visual fields intact bilaterally No obvious facial asymmetry. Tongue midline Motor power full in all extremities Negative pronator drift. Fine motor and coordination intact Gait intact Romberg not assessed Heel to toe not assessed Distal pulses palpable bilaterally.    Disposition:    Allergies as of 05/15/2019      Reactions   Baclofen Other (See Comments)   Back pain. Patient said it made him feel crazy   Efavirenz Rash   Nevirapine Rash, Other (See Comments)      Medication List    TAKE these medications   amLODipine 10 MG tablet Commonly known as: NORVASC Take 1 tablet (10 mg total) by mouth daily.   aspirin EC 81 MG tablet Take 1 tablet (81 mg total) by mouth daily.   clopidogrel 75 MG tablet Commonly known as: Plavix Take 1 tablet (75 mg total) by mouth daily.   finasteride 5 MG tablet Commonly known as: PROSCAR Take 5 mg by mouth daily.   MULTIVITAMIN PO Take 1 tablet by mouth daily.   pantoprazole 40 MG tablet Commonly known as: PROTONIX Take 20 mg by mouth 2 (two) times daily.   pantoprazole  20 MG tablet Commonly known as: Protonix Take 1 tablet (20 mg total) by mouth 2 (two) times daily.   Triumeq 600-50-300 MG tablet Generic drug: abacavir-dolutegravir-lamiVUDine Take 1 tablet by mouth once daily      Follow-up Information    Luanne Bras, MD Follow up.   Specialties: Interventional Radiology, Radiology Why: IR  scheduler will call you with appointment date and time (~2 weeks from procedure date). Please call [phone] or [phone] with questions or concerns prior to your appointment.  Contact information: [place] [id number] [phone]            Electronically Signed: Joaquim Nam, PA-C 05/15/2019, 11:38 AM   I have spent Less Than 30 Minutes discharging Jeffrey [name].

## 2019-05-30 ENCOUNTER — Emergency Department (HOSPITAL_COMMUNITY)
Admission: EM | Admit: 2019-05-30 | Discharge: 2019-05-30 | Disposition: A | Discharge status: Home / Self Care | Payer: Medicare HMO | Attending: Emergency Medicine | Admitting: Emergency Medicine

## 2019-05-30 ENCOUNTER — Ambulatory Visit (HOSPITAL_COMMUNITY): Admission: RE | Admit: 2019-05-30 | Payer: Medicare HMO | Source: Ambulatory Visit

## 2019-05-30 ENCOUNTER — Emergency Department (HOSPITAL_COMMUNITY): Payer: Medicare HMO

## 2019-05-30 DIAGNOSIS — R0602 Shortness of breath: Secondary | ICD-10-CM

## 2019-05-30 DIAGNOSIS — R279 Unspecified lack of coordination: Secondary | ICD-10-CM | POA: Diagnosis not present

## 2019-05-30 DIAGNOSIS — R05 Cough: Secondary | ICD-10-CM | POA: Diagnosis not present

## 2019-05-30 DIAGNOSIS — N189 Chronic kidney disease, unspecified: Secondary | ICD-10-CM | POA: Diagnosis not present

## 2019-05-30 DIAGNOSIS — R0902 Hypoxemia: Secondary | ICD-10-CM | POA: Diagnosis not present

## 2019-05-30 DIAGNOSIS — U071 COVID-19: Secondary | ICD-10-CM | POA: Diagnosis not present

## 2019-05-30 DIAGNOSIS — Z7982 Long term (current) use of aspirin: Secondary | ICD-10-CM | POA: Insufficient documentation

## 2019-05-30 DIAGNOSIS — R5383 Other fatigue: Secondary | ICD-10-CM | POA: Insufficient documentation

## 2019-05-30 DIAGNOSIS — B20 Human immunodeficiency virus [HIV] disease: Secondary | ICD-10-CM | POA: Diagnosis not present

## 2019-05-30 DIAGNOSIS — I5030 Unspecified diastolic (congestive) heart failure: Secondary | ICD-10-CM | POA: Diagnosis not present

## 2019-05-30 DIAGNOSIS — Z79899 Other long term (current) drug therapy: Secondary | ICD-10-CM | POA: Diagnosis not present

## 2019-05-30 DIAGNOSIS — Z8673 Personal history of transient ischemic attack (TIA), and cerebral infarction without residual deficits: Secondary | ICD-10-CM | POA: Insufficient documentation

## 2019-05-30 DIAGNOSIS — Z743 Need for continuous supervision: Secondary | ICD-10-CM | POA: Diagnosis not present

## 2019-05-30 DIAGNOSIS — R531 Weakness: Secondary | ICD-10-CM | POA: Diagnosis not present

## 2019-05-30 DIAGNOSIS — R630 Anorexia: Secondary | ICD-10-CM | POA: Diagnosis not present

## 2019-05-30 DIAGNOSIS — I130 Hypertensive heart and chronic kidney disease with heart failure and stage 1 through stage 4 chronic kidney disease, or unspecified chronic kidney disease: Secondary | ICD-10-CM | POA: Insufficient documentation

## 2019-05-30 DIAGNOSIS — Z87891 Personal history of nicotine dependence: Secondary | ICD-10-CM | POA: Diagnosis not present

## 2019-05-30 DIAGNOSIS — R110 Nausea: Secondary | ICD-10-CM | POA: Insufficient documentation

## 2019-05-30 LAB — CBC WITH DIFFERENTIAL/PLATELET
Abs Immature Granulocytes: 0.01 10*3/uL (ref 0.00–0.07)
Basophils Absolute: 0 10*3/uL (ref 0.0–0.1)
Basophils Relative: 0 %
Eosinophils Absolute: 0 10*3/uL (ref 0.0–0.5)
Eosinophils Relative: 0 %
HCT: 40.6 % (ref 39.0–52.0)
Hemoglobin: 14.1 g/dL (ref 13.0–17.0)
Immature Granulocytes: 0 %
Lymphocytes Relative: 19 %
Lymphs Abs: 1 10*3/uL (ref 0.7–4.0)
MCH: 33.7 pg (ref 26.0–34.0)
MCHC: 34.7 g/dL (ref 30.0–36.0)
MCV: 97.1 fL (ref 80.0–100.0)
Monocytes Absolute: 0.3 10*3/uL (ref 0.1–1.0)
Monocytes Relative: 6 %
Neutro Abs: 4 10*3/uL (ref 1.7–7.7)
Neutrophils Relative %: 75 %
Platelets: 198 10*3/uL (ref 150–400)
RBC: 4.18 MIL/uL — ABNORMAL LOW (ref 4.22–5.81)
RDW: 11.9 % (ref 11.5–15.5)
WBC: 5.3 10*3/uL (ref 4.0–10.5)
nRBC: 0 % (ref 0.0–0.2)

## 2019-05-30 LAB — TRIGLYCERIDES: Triglycerides: 120 mg/dL (ref <150)

## 2019-05-30 LAB — COMPREHENSIVE METABOLIC PANEL
ALT: 57 U/L — ABNORMAL HIGH (ref 0–44)
AST: 56 U/L — ABNORMAL HIGH (ref 15–41)
Albumin: 3.1 g/dL — ABNORMAL LOW (ref 3.5–5.0)
Alkaline Phosphatase: 78 U/L (ref 38–126)
Anion gap: 11 (ref 5–15)
BUN: 27 mg/dL — ABNORMAL HIGH (ref 8–23)
CO2: 22 mmol/L (ref 22–32)
Calcium: 8.5 mg/dL — ABNORMAL LOW (ref 8.9–10.3)
Chloride: 101 mmol/L (ref 98–111)
Creatinine, Ser: 1.54 mg/dL — ABNORMAL HIGH (ref 0.61–1.24)
GFR calc Af Amer: 52 mL/min — ABNORMAL LOW (ref >60)
GFR calc non Af Amer: 45 mL/min — ABNORMAL LOW (ref >60)
Glucose, Bld: 120 mg/dL — ABNORMAL HIGH (ref 70–99)
Potassium: 3.3 mmol/L — ABNORMAL LOW (ref 3.5–5.1)
Sodium: 134 mmol/L — ABNORMAL LOW (ref 135–145)
Total Bilirubin: 0.7 mg/dL (ref 0.3–1.2)
Total Protein: 6.9 g/dL (ref 6.5–8.1)

## 2019-05-30 LAB — D-DIMER, QUANTITATIVE: D-Dimer, Quant: 1.16 ug/mL-FEU — ABNORMAL HIGH (ref 0.00–0.50)

## 2019-05-30 LAB — RESPIRATORY PANEL BY RT PCR (FLU A&B, COVID)
Influenza A by PCR: NEGATIVE
Influenza B by PCR: NEGATIVE
SARS Coronavirus 2 by RT PCR: POSITIVE — AB

## 2019-05-30 LAB — FIBRINOGEN: Fibrinogen: 631 mg/dL — ABNORMAL HIGH (ref 210–475)

## 2019-05-30 LAB — PROCALCITONIN: Procalcitonin: <0.1 ng/mL

## 2019-05-30 LAB — FERRITIN: Ferritin: 481 ng/mL — ABNORMAL HIGH (ref 24–336)

## 2019-05-30 LAB — C-REACTIVE PROTEIN: CRP: 7.3 mg/dL — ABNORMAL HIGH (ref <1.0)

## 2019-05-30 LAB — LACTATE DEHYDROGENASE: LDH: 256 U/L — ABNORMAL HIGH (ref 98–192)

## 2019-05-30 LAB — LACTIC ACID, PLASMA: Lactic Acid, Venous: 1.3 mmol/L (ref 0.5–1.9)

## 2019-05-30 LAB — TROPONIN I (HIGH SENSITIVITY): Troponin I (High Sensitivity): 9 ng/L (ref <18)

## 2019-05-30 LAB — BRAIN NATRIURETIC PEPTIDE: B Natriuretic Peptide: 19.6 pg/mL (ref 0.0–100.0)

## 2019-05-30 MED ORDER — DEXAMETHASONE SODIUM PHOSPHATE 10 MG/ML IJ SOLN
10.0000 mg | Freq: Once | INTRAMUSCULAR | Status: AC
  Administered 2019-05-30: 10 mg via INTRAVENOUS
  Filled 2019-05-30: qty 1

## 2019-05-30 MED ORDER — PREDNISONE 20 MG PO TABS: ORAL_TABLET | ORAL | 0 refills | Status: DC

## 2019-05-30 MED ORDER — ALBUTEROL SULFATE HFA 108 (90 BASE) MCG/ACT IN AERS
2.0000 | INHALATION_SPRAY | Freq: Once | RESPIRATORY_TRACT | Status: AC
  Administered 2019-05-30: 2 via RESPIRATORY_TRACT
  Filled 2019-05-30: qty 6.7

## 2019-05-30 NOTE — ED Provider Notes (Signed)
[place] EMERGENCY DEPARTMENT Provider Note   CSN: [id number] Arrival date & time: 05/30/19  1328     History Chief Complaint  Patient presents with  . Shortness of Breath  . Weakness  . Cough    Jeffrey [name] is a 74 y.o. male.  HPI     Patient presents with dyspnea, cough, fatigue.  On he has no chest pain, no abdominal pain, or other specific discomfort.  However, he does describe generally feeling poorly. He has a notable history of angioplasty 2 weeks ago.  He notes that for the week following the procedure he was well, but now over the past 5 to 7 days he has been progressively weak with dyspnea, particularly with exertion, though also present at rest. No fever, no confusion, disorientation, vomiting, diarrhea. He does complain of anorexia, nausea, however.  Given the after mentioned concerns he spoke with his cardiology team and was sent here for evaluation.  Past Medical History:  Diagnosis Date  . Chronic kidney disease   . Colon polyps   . GERD (gastroesophageal reflux disease)   . Heart murmur   . History of kidney stones   . HIV (human immunodeficiency virus infection) ([place]) 1999  . Hypertension   . Pneumonia   . Stroke [facility])    right side weakness  . TIA (transient ischemic attack)     Patient Active Problem List   Diagnosis Date Noted  . Internal carotid artery stenosis, right 05/14/2019  . Chest pain 02/24/2019  . Peripheral vertigo 11/29/2018  . Encounter for health maintenance examination in adult 12/20/2017  . Chronic kidney disease 12/20/2017  . Diastolic CHF with preserved left ventricular function, NYHA class 2 ([place]) 01/06/2015  . Muscle spasm of back 10/15/2014  . Exertional dyspnea 09/30/2014  . Insomnia 07/22/2014  . BPH with obstruction/lower urinary tract symptoms 06/17/2014  . Screening for colon cancer 10/07/2013  . Left ankle pain 08/07/2013  . Dyslipidemia 07/22/2012  . Erectile dysfunction 07/22/2012  .  GERD 05/17/2010  . Dysphagia 03/08/2009  . CERVICAL LYMPHADENOPATHY 09/19/2007  . CHOLECYSTECTOMY, HX OF 05/28/2006  . Human immunodeficiency virus (HIV) disease ([place]) 02/27/2006  . Essential hypertension 02/27/2006  . PNEUMONIA, HX OF 02/27/2006  . ARTHROSCOPY, KNEE, HX OF 02/27/2006    Past Surgical History:  Procedure Laterality Date  . CHOLECYSTECTOMY  1982  . COLONOSCOPY    . ESOPHAGEAL MANOMETRY N/A 09/03/2017   Procedure: ESOPHAGEAL MANOMETRY (EM);  Surgeon: Mauri Pole, MD;  Location: [facility];  Service: Endoscopy;  Laterality: N/A;  . IR ANGIO INTRA EXTRACRAN SEL COM CAROTID INNOMINATE BILAT MOD SED  04/21/2019  . IR ANGIO VERTEBRAL SEL VERTEBRAL BILAT MOD SED  04/21/2019  . IR PTA INTRACRANIAL  05/14/2019  . KNEE SURGERY     LEFT KNEE  . RADIOLOGY WITH ANESTHESIA N/A 05/14/2019   Procedure: IR WITH ANESTHESIA STENTING;  Surgeon: Luanne Bras, MD;  Location: [place];  Service: Radiology;  Laterality: N/A;  . TONSILLECTOMY         Family History  Problem Relation Age of Onset  . Hypertension Mother   . Heart attack Mother 60  . Colon cancer Neg Hx   . Esophageal cancer Neg Hx   . Pancreatic cancer Neg Hx   . Stomach cancer Neg Hx   . Liver disease Neg Hx     Social History   Tobacco Use  . Smoking status: Never Smoker  . Smokeless tobacco: Former Systems developer  Types: Chew  . Tobacco comment: Rare use, about q 6 months  Substance Use Topics  . Alcohol use: Yes    Comment: rare  . Drug use: No    Home Medications Prior to Admission medications   Medication Sig Start Date End Date Taking? Authorizing Provider  amLODipine (NORVASC) 10 MG tablet Take 1 tablet (10 mg total) by mouth daily. 04/23/19   Josue Hector, MD  aspirin EC 81 MG tablet Take 1 tablet (81 mg total) by mouth daily. 10/11/15   Karamalegos, Devonne Doughty, DO  clopidogrel (PLAVIX) 75 MG tablet Take 1 tablet (75 mg total) by mouth daily. 04/07/19   Josue Hector, MD  finasteride  (PROSCAR) 5 MG tablet Take 5 mg by mouth daily.    [provider]  Multiple Vitamins-Minerals (MULTIVITAMIN PO) Take 1 tablet by mouth daily.    [provider]  pantoprazole (PROTONIX) 20 MG tablet Take 1 tablet (20 mg total) by mouth 2 (two) times daily. Patient not taking: Reported on 05/05/2019 04/23/19   Yetta Flock, MD  pantoprazole (PROTONIX) 40 MG tablet Take 20 mg by mouth 2 (two) times daily.    [provider]  TRIUMEQ 600-50-300 MG tablet Take 1 tablet by mouth once daily Patient taking differently: Take 1 tablet by mouth daily.  03/10/19   [name], MD    Allergies    Baclofen, Efavirenz, and Nevirapine  Review of Systems   Review of Systems  Constitutional:       Per HPI, otherwise negative  HENT:       Per HPI, otherwise negative  Respiratory:       Per HPI, otherwise negative  Cardiovascular:       Per HPI, otherwise negative  Gastrointestinal: Negative for vomiting.  Endocrine:       Negative aside from HPI  Genitourinary:       Neg aside from HPI   Musculoskeletal:       Per HPI, otherwise negative  Skin: Negative.   Neurological: Negative for syncope.    Physical Exam Updated Vital Signs BP 123/89   Pulse 100   Temp 99.3 F (37.4 C) (Oral)   Resp (!) 23   SpO2 95%   Physical Exam Vitals and nursing note reviewed.  Constitutional:      General: He is not in acute distress.    Appearance: He is well-developed.  HENT:     Head: Normocephalic and atraumatic.  Eyes:     Conjunctiva/sclera: Conjunctivae normal.  Cardiovascular:     Rate and Rhythm: Normal rate and regular rhythm.  Pulmonary:     Effort: Pulmonary effort is normal. No respiratory distress.     Breath sounds: No stridor.  Abdominal:     General: There is no distension.     Comments: Abdomen is soft, but large.  Nontender.  Musculoskeletal:     Right lower leg: Edema present.     Left lower leg: Edema present.  Skin:    General: Skin is  warm and dry.  Neurological:     Mental Status: He is alert and oriented to person, place, and time.     ED Results / Procedures / Treatments   Labs (all labs ordered are listed, but only abnormal results are displayed) Labs Reviewed  RESPIRATORY PANEL BY RT PCR (FLU A&B, COVID)  COMPREHENSIVE METABOLIC PANEL  CBC WITH DIFFERENTIAL/PLATELET  BRAIN NATRIURETIC PEPTIDE  URINALYSIS, ROUTINE W REFLEX MICROSCOPIC    EKG EKG Interpretation  Date/Time:  Friday May 30 2019 13:32:21 EST Ventricular Rate:  110 PR Interval:  128 QRS Duration: 88 QT Interval:  310 QTC Calculation: 419 R Axis:   93 Text Interpretation: Sinus tachycardia Rightward axis ST & T wave abnormality, consider inferior ischemia ST & T wave abnormality, consider anterolateral ischemia Abnormal ECG rate faster than previous Confirmed by Wandra Arthurs [phone]) on 05/30/2019 4:30:38 PM   Radiology DG Chest 2 View  Result Date: 05/30/2019 CLINICAL DATA:  Dyspnea, cough, weakness EXAM: CHEST - 2 VIEW COMPARISON:  10/17/2015 chest radiograph. FINDINGS: Stable cardiomediastinal silhouette with normal heart size. No pneumothorax. No pleural effusion. New faint patchy opacities in peripheral mid left lung and infrahilar right lung. IMPRESSION: New faint bilateral patchy lung opacities, which may indicate atypical/viral pneumonia. Suggest follow-up chest radiographs to document resolution. Electronically Signed   By: Ilona Sorrel M.D.   On: 05/30/2019 14:44    Procedures Procedures (including critical care time)  Medications Ordered in ED Medications - No data to display  ED Course  I have reviewed the triage vital signs and the nursing notes.  Pertinent labs & imaging results that were available during my care of the patient were reviewed by me and considered in my medical decision making (see chart for details).    MDM Rules/Calculators/A&P                      This adult male now 2-week status post angioplasty  presents with new dyspnea, fatigue, soreness, edema. Patient is awake, alert, hemodynamically unremarkable.  However, patient found to have abnormal EKG, given concern for infection versus heart failure versus other etiology, the patient brought differential requiring labs, x-ray, EKG. On signout x-rays consistent with atypical pneumonia versus edema.  Dr. Darl Householder is aware of the patient and will follow his course. Final Clinical Impression(s) / ED Diagnoses Final diagnoses:  Shortness of breath    Rx / DC Orders ED Discharge Orders    None       Carmin Muskrat, MD 05/30/19 1657

## 2019-05-30 NOTE — ED Notes (Signed)
Patient verbalizes understanding of discharge instructions. Opportunity for questioning and answers were provided. Armband removed by staff, pt discharged from ED. Pt transported home by [facility].

## 2019-05-30 NOTE — ED Notes (Signed)
Pt ambulated inside the room. Pt stable on feet with steady gait. Pt denies headache or dizziness. SpO2 92% on RA.

## 2019-05-30 NOTE — ED Notes (Signed)
CALLED [facility] FOR TRANSPORT HOME--Jeffrey [name]

## 2019-05-30 NOTE — ED Provider Notes (Signed)
  Physical Exam  BP (!) 127/94   Pulse (!) 105   Temp 99.3 F (37.4 C) (Oral)   Resp (!) 25   SpO2 94%   Physical Exam  ED Course/Procedures     Procedures  MDM  Patient care assumed at 4 pm. Patient here with chills and SOB. Patient recently had angioplasty done in the hospital and has been home since then.  Patient has some shortness of breath no chest x-ray showed atypical edema.  Consider Covid versus CHF.  Labs pending Covid test pending.  7:23 PM Patient is Covid positive.  His inflammatory markers are mildly elevated.  His creatinine is baseline patient's troponin and BNP is normal however.  Patient appears well and after he ambulated his oxygen only decreased to 92%.  Patient does have a home here and wants to go home.  I set him up with a Covid to home program.  I think he can use 2 L nasal cannula as needed.  We will give him a course of steroids as well.  He may qualify for plasma infusion as well   Drenda Freeze, MD 05/30/19 1924

## 2019-05-30 NOTE — Discharge Instructions (Addendum)
You have COVID. You are getting set up for the home program. Case management will call you tomorrow.   Use oxygen as needed for shortness of breath   Use albuterol every 4 hrs as needed   Take prednisone as prescribed.   See your doctor  Stay home for 10 days   Return to ER if you have worse shortness of breath, trouble breathing, fever.

## 2019-05-30 NOTE — ED Triage Notes (Signed)
Pt presents with c/o dry cough, abdominal distention, nauseated. Recent Angioplasty surgery on 05/14/2019. Pt is alert and oriented, NAD. They are sending him here for evaluation.

## 2019-05-30 NOTE — Progress Notes (Addendum)
TOC CM referral to arrange [facility], and oxygen. Referral for oxygen sent to [employer] rep. Jonnie Finner RN CCM, [facility] ED TOC CM [phone]  05/31/2019 1200 pm Spoke to pt and states he has not pick up meds. Called pt's Rx into his pharmacy. He lives at home alone and family lives out of town. States he is doing well at home just fatigued. Faxed referral to [facility]. Pt verbalized acceptance of program. Contacted [employer] and [employer] rep, insurance does not cover oxygen unless he qualifies. They can set up overnight pulse oximetry to evaluate if oxygen drops at night. ED provided updated. [place], [place] ED TOC CM [phone]

## 2019-05-31 ENCOUNTER — Encounter: Payer: Self-pay | Admitting: Infectious Diseases

## 2019-06-03 ENCOUNTER — Ambulatory Visit: Payer: Medicare HMO | Admitting: Cardiovascular Disease

## 2019-06-03 ENCOUNTER — Telehealth (HOSPITAL_COMMUNITY): Payer: Self-pay

## 2019-06-03 NOTE — Telephone Encounter (Signed)
Pt will call to reschedule consult when he is cleared, COVID positive on 1/22. AW

## 2019-06-04 ENCOUNTER — Telehealth: Payer: Self-pay

## 2019-06-04 LAB — CULTURE, BLOOD (ROUTINE X 2)
Culture: NO GROWTH
Culture: NO GROWTH
Special Requests: ADEQUATE
Special Requests: ADEQUATE

## 2019-06-04 NOTE — Telephone Encounter (Signed)
Received call from [name] with [facility] caring for patient in the community who are covid +. Program just wanted to make [facility] aware of diagnosis.  Jeffrey [name]

## 2019-06-13 ENCOUNTER — Other Ambulatory Visit (HOSPITAL_COMMUNITY): Payer: Self-pay | Admitting: Interventional Radiology

## 2019-06-13 DIAGNOSIS — I771 Stricture of artery: Secondary | ICD-10-CM

## 2019-06-20 ENCOUNTER — Other Ambulatory Visit: Payer: Self-pay

## 2019-06-20 ENCOUNTER — Ambulatory Visit (HOSPITAL_COMMUNITY)
Admission: RE | Admit: 2019-06-20 | Discharge: 2019-06-20 | Disposition: A | Discharge status: Home / Self Care | Payer: Medicare HMO | Source: Ambulatory Visit | Attending: Interventional Radiology | Admitting: Interventional Radiology

## 2019-06-20 DIAGNOSIS — I771 Stricture of artery: Secondary | ICD-10-CM

## 2019-06-20 NOTE — Progress Notes (Signed)
Chief Complaint: Patient was seen in consultation today for right ICA supraclinoid segment stenosis s/p revascularization.  Referring Physician(s): Josue Hector  Supervising Physician: Luanne Bras  Patient Status: [facility] - Out[facility]  History of Present Illness: Jeffrey [name] is a 74 y.o. male with a past medical history as below, with pertinent past medical history including hypertension, TIA, CVA, pneumonia, GERD, nephrolithiasis, CKD, and HIV. He is known to [facility] and has been followed by Dr. Estanislado Pandy since 04/2019. He first presented to our department at the request of Dr. Johnsie Cancel for management of intracranial stenosis. He underwent an image-guided cerebral arteriogram with revascularization of right ICA supraclinoid segment stenosis using balloon angioplasty 05/14/2019 by Dr. Estanislado Pandy. Patient was discharged home 05/15/2019 in stable condition.  Patient presents today for follow-up regarding his recent procedure 05/14/2019. Patient awake and alert sitting in chair with no complaints at this time. States that he "feels like a new person besides COVID" since procedure. Denies headache, weakness, numbness/tingling, dizziness, vision changes, hearing changes, tinnitus, or speech difficulty.  Currently taking Plavix 75 mg once daily and Aspirin 81 mg once daily.   Past Medical History:  Diagnosis Date  . Chronic kidney disease   . Colon polyps   . GERD (gastroesophageal reflux disease)   . Heart murmur   . History of kidney stones   . HIV (human immunodeficiency virus infection) ([place]) 1999  . Hypertension   . Pneumonia   . Stroke [facility])    right side weakness  . TIA (transient ischemic attack)     Past Surgical History:  Procedure Laterality Date  . CHOLECYSTECTOMY  1982  . COLONOSCOPY    . ESOPHAGEAL MANOMETRY N/A 09/03/2017   Procedure: ESOPHAGEAL MANOMETRY (EM);  Surgeon: Mauri Pole, MD;  Location: [facility];  Service: Endoscopy;  Laterality: N/A;  . IR  ANGIO INTRA EXTRACRAN SEL COM CAROTID INNOMINATE BILAT MOD SED  04/21/2019  . IR ANGIO VERTEBRAL SEL VERTEBRAL BILAT MOD SED  04/21/2019  . IR PTA INTRACRANIAL  05/14/2019  . KNEE SURGERY     LEFT KNEE  . RADIOLOGY WITH ANESTHESIA N/A 05/14/2019   Procedure: IR WITH ANESTHESIA STENTING;  Surgeon: Luanne Bras, MD;  Location: [place];  Service: Radiology;  Laterality: N/A;  . TONSILLECTOMY      Allergies: Baclofen, Efavirenz, and Nevirapine  Medications: Prior to Admission medications   Medication Sig Start Date End Date Taking? Authorizing Provider  amLODipine (NORVASC) 10 MG tablet Take 1 tablet (10 mg total) by mouth daily. 04/23/19   Josue Hector, MD  aspirin EC 81 MG tablet Take 1 tablet (81 mg total) by mouth daily. 10/11/15   Karamalegos, Devonne Doughty, DO  clopidogrel (PLAVIX) 75 MG tablet Take 1 tablet (75 mg total) by mouth daily. 04/07/19   Josue Hector, MD  finasteride (PROSCAR) 5 MG tablet Take 5 mg by mouth daily.    [provider]  Multiple Vitamins-Minerals (MULTIVITAMIN PO) Take 1 tablet by mouth daily.    [provider]  pantoprazole (PROTONIX) 20 MG tablet Take 1 tablet (20 mg total) by mouth 2 (two) times daily. Patient not taking: Reported on 05/05/2019 04/23/19   Yetta Flock, MD  pantoprazole (PROTONIX) 40 MG tablet Take 20 mg by mouth 2 (two) times daily.    [provider]  predniSONE (DELTASONE) 20 MG tablet Take 60 mg daily x 2 days then 40 mg daily x 2 days then 20 mg daily x 2 days 05/30/19   Darl Householder,  Lujean Rave, MD  TRIUMEQ 600-50-300 MG tablet Take 1 tablet by mouth once daily Patient taking differently: Take 1 tablet by mouth daily.  03/10/19   [name], MD     Family History  Problem Relation Age of Onset  . Hypertension Mother   . Heart attack Mother 53  . Colon cancer Neg Hx   . Esophageal cancer Neg Hx   . Pancreatic cancer Neg Hx   . Stomach cancer Neg Hx   . Liver disease Neg Hx     Social History    Socioeconomic History  . Marital status: Divorced    Spouse name: Not on file  . Number of children: Not on file  . Years of education: Not on file  . Highest education level: Not on file  Occupational History  . Occupation: retired    Fish farm manager: [employer]  Tobacco Use  . Smoking status: Never Smoker  . Smokeless tobacco: Former Systems developer    Types: Chew  . Tobacco comment: Rare use, about q 6 months  Substance and Sexual Activity  . Alcohol use: Yes    Comment: rare  . Drug use: No  . Sexual activity: Yes    Partners: Female    Comment: declined condoms  Other Topics Concern  . Not on file  Social History Narrative  . Not on file   Social Determinants of Health   Financial Resource Strain:   . Difficulty of Paying Living Expenses: Not on file  Food Insecurity:   . Worried About Charity fundraiser in the Last Year: Not on file  . Ran Out of Food in the Last Year: Not on file  Transportation Needs:   . Lack of Transportation (Medical): Not on file  . Lack of Transportation (Non-Medical): Not on file  Physical Activity:   . Days of Exercise per Week: Not on file  . Minutes of Exercise per Session: Not on file  Stress:   . Feeling of Stress : Not on file  Social Connections:   . Frequency of Communication with Friends and Family: Not on file  . Frequency of Social Gatherings with Friends and Family: Not on file  . Attends Religious Services: Not on file  . Active Member of Clubs or Organizations: Not on file  . Attends Archivist Meetings: Not on file  . Marital Status: Not on file     Review of Systems: A 12 point ROS discussed and pertinent positives are indicated in the HPI above.  All other systems are negative.  Review of Systems  Constitutional: Negative for chills and fever.  HENT: Negative for hearing loss and tinnitus.   Eyes: Negative for visual disturbance.  Respiratory: Negative for shortness of breath and wheezing.   Cardiovascular: Negative  for chest pain and palpitations.  Neurological: Negative for dizziness, speech difficulty, weakness, numbness and headaches.  Psychiatric/Behavioral: Negative for behavioral problems and confusion.    Vital Signs: There were no vitals taken for this visit.  Physical Exam Constitutional:      General: He is not in acute distress.    Appearance: Normal appearance.  Pulmonary:     Effort: Pulmonary effort is normal. No respiratory distress.  Skin:    General: Skin is warm and dry.  Neurological:     Mental Status: He is alert and oriented to person, place, and time.  Psychiatric:        Mood and Affect: Mood normal.  Behavior: Behavior normal.      Imaging: DG Chest 2 View  Result Date: 05/30/2019 CLINICAL DATA:  Dyspnea, cough, weakness EXAM: CHEST - 2 VIEW COMPARISON:  10/17/2015 chest radiograph. FINDINGS: Stable cardiomediastinal silhouette with normal heart size. No pneumothorax. No pleural effusion. New faint patchy opacities in peripheral mid left lung and infrahilar right lung. IMPRESSION: New faint bilateral patchy lung opacities, which may indicate atypical/viral pneumonia. Suggest follow-up chest radiographs to document resolution. Electronically Signed   By: Ilona Sorrel M.D.   On: 05/30/2019 14:44    Labs:  CBC: Recent Labs    04/21/19 0654 05/14/19 0645 05/15/19 0530 05/30/19 1800  WBC 6.4 5.3 8.0 5.3  HGB 13.9 14.4 12.4* 14.1  HCT 41.1 41.6 35.4* 40.6  PLT 208 174 189 198    COAGS: Recent Labs    04/21/19 0654 05/14/19 0645  INR 0.9 1.0  APTT  --  30    BMP: Recent Labs    04/21/19 0654 05/14/19 0645 05/15/19 0530 05/30/19 1800  NA 142 139 140 134*  K 3.5 3.6 3.6 3.3*  CL 109 107 107 101  CO2 24 24 23 22   GLUCOSE 107* 103* 128* 120*  BUN 21 29* 31* 27*  CALCIUM 9.3 9.2 8.6* 8.5*  CREATININE 1.56* 1.41* 1.38* 1.54*  GFRNONAA 44* 50* 51* 45*  GFRAA 51* 58* 59* 52*    LIVER FUNCTION TESTS: Recent Labs    07/25/18 0836  11/26/18 1638 02/10/19 0911 05/30/19 1800  BILITOT 0.4 0.3 0.4 0.7  AST 24 18 17  56*  ALT 23 16 18  57*  ALKPHOS  --  77  --  78  PROT 6.6 6.6 6.3 6.9  ALBUMIN  --  4.3  --  3.1*     Assessment and Plan:  Right ICA supraclinoid segment stenosis s/p revascularization using balloon angioplasty 05/14/2019 by Dr. Estanislado Pandy. Dr. Estanislado Pandy was present for consultation.  Discussed post-procedure course. Patient states that he is doing great besides getting COVID since he left the hospital. States that he has mostly recovered from [place], however will occasionally become short of breath. Denies neurological symptoms at this time. Instructed patient to call 911 if he develops any stroke-like symptoms (weakness/numbness/tingling of left side, facial droop, speech difficulty, ect).  Discussed right ICA supraclinoid stenosis s/p revascularization. Explained the best course of management at this time is with routine imaging scans to monitor for changes. Plan for follow-up with MRA head (without contrast) 4 months from procedure 05/14/2019. Informed patient that our schedulers will call him to set up this imaging scan. Instructed patient to continue taking Plavix 75 mg once daily and Aspirin 81 mg once daily. Instructed patient to stay hydrated by drinking plenty of water.  All questions answered and concerns addressed. Patient conveys understanding and agrees with plan.  Thank you for this interesting consult.  I greatly enjoyed meeting OUSMANE SEEMAN and look forward to participating in their care.  A copy of this report was sent to the requesting provider on this date.  Electronically Signed: Earley Abide, PA-C 06/20/2019, 10:47 AM   I spent a total of 25 Minutes in face to face in clinical consultation, greater than 50% of which was counseling/coordinating care for right ICA supraclinoid segment stenosis s/p revascularization.

## 2019-06-25 ENCOUNTER — Ambulatory Visit (HOSPITAL_COMMUNITY): Payer: Medicare HMO

## 2019-06-26 ENCOUNTER — Other Ambulatory Visit: Payer: Self-pay

## 2019-06-26 ENCOUNTER — Other Ambulatory Visit: Payer: Self-pay | Admitting: Gastroenterology

## 2019-07-03 NOTE — Progress Notes (Signed)
CARDIOLOGY CONSULT NOTE       Patient ID: Jeffrey [name] MRN: [id number] DOB/AGE: 1945-07-24 74 y.o.  Admit date: (Not on file) Referring Physician: Marcella Dubs  Primary Physician: Danna Hefty, DO Primary Cardiologist: New Reason for Consultation: Dyspnea/HTN    HPI:  74 y.o. with HTN, HIV, GERD and ? Diastolic dysfunction. Complained of dyspnea on exertion and chest pain for over a year. Chest pain sharp and left sided ? Murmur with skipped beats during activity per physical therapist Symptoms better at rest Seen by me in 2016 for same. Echo with EF 50-55% only abnormal relaxation on diastolic evaluation normal ETT going 7 minutes on treadmill CXR at that time with hyper inflation   ? In April had paresthesias in left arm and difficulty walking straight Did not have MRI / CT or stroke w/u. Complains of dyspnea with exertion especially going up steps and associated with some tightness in chest   Subsequently had MR/MRA head and neck with IR angiography 12/14 and subsequent intervention to the right ICA supraclinoid segment by Dr Estanislado Pandy  Echo reviewed from 03/05/19 EF 55-60% impaired relaxation Ao-38 mm no significant valve disease  Cardiac CTA 03/21/19 calcium socre 24 only 81 th percentile for age and sex non obstructive CAD  Started on norvasc for his BP  Seen in ER 05/30/19 COVID positive Not hospitalized Rx with 2 L  and steroids  Doing well    ROS All other systems reviewed and negative except as noted above  Past Medical History:  Diagnosis Date  . Chronic kidney disease   . Colon polyps   . GERD (gastroesophageal reflux disease)   . Heart murmur   . History of kidney stones   . HIV (human immunodeficiency virus infection) ([place]) 1999  . Hypertension   . Pneumonia   . Stroke [facility])    right side weakness  . TIA (transient ischemic attack)     Family History  Problem Relation Age of Onset  . Hypertension Mother   . Heart attack Mother 65  . Colon  cancer Neg Hx   . Esophageal cancer Neg Hx   . Pancreatic cancer Neg Hx   . Stomach cancer Neg Hx   . Liver disease Neg Hx     Social History   Socioeconomic History  . Marital status: Divorced    Spouse name: Not on file  . Number of children: Not on file  . Years of education: Not on file  . Highest education level: Not on file  Occupational History  . Occupation: retired    Fish farm manager: [employer]  Tobacco Use  . Smoking status: Never Smoker  . Smokeless tobacco: Former Systems developer    Types: Chew  . Tobacco comment: Rare use, about q 6 months  Substance and Sexual Activity  . Alcohol use: Yes    Comment: rare  . Drug use: No  . Sexual activity: Yes    Partners: Female    Comment: declined condoms  Other Topics Concern  . Not on file  Social History Narrative  . Not on file   Social Determinants of Health   Financial Resource Strain:   . Difficulty of Paying Living Expenses: Not on file  Food Insecurity:   . Worried About Charity fundraiser in the Last Year: Not on file  . Ran Out of Food in the Last Year: Not on file  Transportation Needs:   . Lack of Transportation (Medical): Not on file  . Lack of  Transportation (Non-Medical): Not on file  Physical Activity:   . Days of Exercise per Week: Not on file  . Minutes of Exercise per Session: Not on file  Stress:   . Feeling of Stress : Not on file  Social Connections:   . Frequency of Communication with Friends and Family: Not on file  . Frequency of Social Gatherings with Friends and Family: Not on file  . Attends Religious Services: Not on file  . Active Member of Clubs or Organizations: Not on file  . Attends Archivist Meetings: Not on file  . Marital Status: Not on file  Intimate Partner Violence:   . Fear of Current or Ex-Partner: Not on file  . Emotionally Abused: Not on file  . Physically Abused: Not on file  . Sexually Abused: Not on file    Past Surgical History:  Procedure Laterality Date  .  CHOLECYSTECTOMY  1982  . COLONOSCOPY    . ESOPHAGEAL MANOMETRY N/A 09/03/2017   Procedure: ESOPHAGEAL MANOMETRY (EM);  Surgeon: Mauri Pole, MD;  Location: [facility];  Service: Endoscopy;  Laterality: N/A;  . IR ANGIO INTRA EXTRACRAN SEL COM CAROTID INNOMINATE BILAT MOD SED  04/21/2019  . IR ANGIO VERTEBRAL SEL VERTEBRAL BILAT MOD SED  04/21/2019  . IR PTA INTRACRANIAL  05/14/2019  . KNEE SURGERY     LEFT KNEE  . RADIOLOGY WITH ANESTHESIA N/A 05/14/2019   Procedure: IR WITH ANESTHESIA STENTING;  Surgeon: Luanne Bras, MD;  Location: [facility];  Service: Radiology;  Laterality: N/A;  . TONSILLECTOMY          Physical Exam: Blood pressure 124/82, pulse 90, height 5' 10"  (1.778 m), weight 168 lb 1.3 oz (76.2 kg), SpO2 98 %.   Affect appropriate Healthy:  appears stated age 43: normal Neck supple with no adenopathy JVP normal no bruits no thyromegaly Lungs clear with no wheezing and good diaphragmatic motion Heart:  S1/S2 no murmur, no rub, gallop or click PMI normal Abdomen: benighn, BS positve, no tenderness, no AAA no bruit.  No HSM or HJR Distal pulses intact with no bruits No edema Neuro non-focal Skin warm and dry No muscular weakness   Labs:   Lab Results  Component Value Date   WBC 5.3 05/30/2019   HGB 14.1 05/30/2019   HCT 40.6 05/30/2019   MCV 97.1 05/30/2019   PLT 198 05/30/2019   No results for input(s): NA, K, CL, CO2, BUN, CREATININE, CALCIUM, PROT, BILITOT, ALKPHOS, ALT, AST, GLUCOSE in the last 168 hours.  Invalid input(s): LABALBU Lab Results  Component Value Date   CKTOTAL 51 04/12/2010   CKMB 1.6 04/12/2010   TROPONINI <0.01        NO INDICATION OF MYOCARDIAL INJURY. 04/12/2010    Lab Results  Component Value Date   CHOL 181 07/25/2018   CHOL 209 (H) 07/24/2017   CHOL 190 07/06/2016   Lab Results  Component Value Date   HDL 48 07/25/2018   HDL 48 07/24/2017   HDL 53 07/06/2016   Lab Results  Component Value Date    LDLCALC 111 (H) 07/25/2018   LDLCALC 125 (H) 07/24/2017   LDLCALC 113 (H) 07/06/2016   Lab Results  Component Value Date   TRIG 120 05/30/2019   TRIG 109 07/25/2018   TRIG 218 (H) 07/24/2017   Lab Results  Component Value Date   CHOLHDL 3.8 07/25/2018   CHOLHDL 4.4 07/24/2017   CHOLHDL 3.6 07/06/2016   No results found for: LDLDIRECT  Radiology: No results found.  EKG: SR rate 61 normal 01/21/19    ASSESSMENT AND PLAN:   1. Dyspnea:  Post COVID check echo to r/o lower EF than previous  2. Chest Pain: non cardiac cardiac CTA 03/21/19 non obstructive CAD  3. HIV:  Continue anti-retroviral Rx f/u Dr Wendie Simmer ID 4. Prostate:  On proscar f/u PSA with primary  5. TIA:  In April subsequent IR intervention to right intracranial ICA by Dr Sallyanne Havers 05/14/19 continue DAT 6. HTN:  Improved with norvasc   7. HLD no recent labs LDL 111 07/25/18 will repeat and start on statin given vascular disease  8. COVID: positive 05/30/19 CXR with faint bilateral patchy lung opacities Rx as outpatient had deltasone BNP and troponin negative Mild elevation in inflammatory markersF/u with primary see above regarding echo   Lipid and Liver Likely start statin  Echo  F/u in 6 months     Signed: Jenkins Rouge 07/08/2019, 10:10 AM

## 2019-07-08 ENCOUNTER — Encounter: Payer: Self-pay | Admitting: Cardiovascular Disease

## 2019-07-08 ENCOUNTER — Other Ambulatory Visit: Payer: Self-pay

## 2019-07-08 ENCOUNTER — Ambulatory Visit (INDEPENDENT_AMBULATORY_CARE_PROVIDER_SITE_OTHER): Payer: Medicare HMO | Admitting: Cardiovascular Disease

## 2019-07-08 VITALS — BP 124/82 | HR 90 | Ht 70.0 in | Wt 168.1 lb

## 2019-07-08 DIAGNOSIS — U071 COVID-19: Secondary | ICD-10-CM | POA: Diagnosis not present

## 2019-07-08 DIAGNOSIS — R0602 Shortness of breath: Secondary | ICD-10-CM | POA: Diagnosis not present

## 2019-07-08 NOTE — Patient Instructions (Signed)
Medication Instructions:  *If you need a refill on your cardiac medications before your next appointment, please call your pharmacy*  Lab Work: Your physician recommends that you return for lab work when you come in for echo, fasting lipid and liver.  If you have labs (blood work) drawn today and your tests are completely normal, you will receive your results only by: Marland Kitchen MyChart Message (if you have MyChart) OR . A paper copy in the mail If you have any lab test that is abnormal or we need to change your treatment, we will call you to review the results.  Testing/Procedures: Your physician has requested that you have an echocardiogram. Echocardiography is a painless test that uses sound waves to create images of your heart. It provides your doctor with information about the size and shape of your heart and how well your heart's chambers and valves are working. This procedure takes approximately one hour. There are no restrictions for this procedure.  Follow-Up: At [facility], you and your health needs are our priority.  As part of our continuing mission to provide you with exceptional heart care, we have created designated Provider Care Teams.  These Care Teams include your primary Cardiologist (physician) and Advanced Practice Providers (APPs -  Physician Assistants and Nurse Practitioners) who all work together to provide you with the care you need, when you need it.  We recommend signing up for the patient portal called "MyChart".  Sign up information is provided on this After Visit Summary.  MyChart is used to connect with patients for Virtual Visits (Telemedicine).  Patients are able to view lab/test results, encounter notes, upcoming appointments, etc.  Non-urgent messages can be sent to your provider as well.   To learn more about what you can do with MyChart, go to [email or web address].    Your next appointment:   1 year(s)  The format for your next appointment:   In  Person  Provider:   You may see Dr. Johnsie Cancel or one of the following Advanced Practice Providers on your designated Care Team:    Truitt Merle, NP  Cecilie Kicks, NP  Kathyrn Drown, NP

## 2019-07-24 ENCOUNTER — Other Ambulatory Visit: Payer: Medicare HMO | Admitting: *Deleted

## 2019-07-24 ENCOUNTER — Ambulatory Visit (HOSPITAL_COMMUNITY): Payer: Medicare HMO | Attending: Cardiovascular Disease

## 2019-07-24 ENCOUNTER — Other Ambulatory Visit: Payer: Self-pay

## 2019-07-24 DIAGNOSIS — R0602 Shortness of breath: Secondary | ICD-10-CM

## 2019-07-24 DIAGNOSIS — U071 COVID-19: Secondary | ICD-10-CM | POA: Diagnosis not present

## 2019-07-24 DIAGNOSIS — E785 Hyperlipidemia, unspecified: Secondary | ICD-10-CM | POA: Diagnosis not present

## 2019-07-24 DIAGNOSIS — I6521 Occlusion and stenosis of right carotid artery: Secondary | ICD-10-CM | POA: Diagnosis not present

## 2019-07-24 LAB — HEPATIC FUNCTION PANEL
ALT: 18 IU/L (ref 0–44)
AST: 22 IU/L (ref 0–40)
Albumin: 4.2 g/dL (ref 3.7–4.7)
Alkaline Phosphatase: 82 IU/L (ref 39–117)
Bilirubin Total: 0.3 mg/dL (ref 0.0–1.2)
Bilirubin, Direct: 0.09 mg/dL (ref 0.00–0.40)
Total Protein: 6.6 g/dL (ref 6.0–8.5)

## 2019-07-24 LAB — LIPID PANEL
Chol/HDL Ratio: 4.9 ratio (ref 0.0–5.0)
Cholesterol, Total: 222 mg/dL — ABNORMAL HIGH (ref 100–199)
HDL: 45 mg/dL (ref >39)
LDL Chol Calc (NIH): 148 mg/dL — ABNORMAL HIGH (ref 0–99)
Triglycerides: 160 mg/dL — ABNORMAL HIGH (ref 0–149)
VLDL Cholesterol Cal: 29 mg/dL (ref 5–40)

## 2019-07-28 ENCOUNTER — Telehealth: Payer: Self-pay | Admitting: Cardiovascular Disease

## 2019-07-28 DIAGNOSIS — E785 Hyperlipidemia, unspecified: Secondary | ICD-10-CM

## 2019-07-28 MED ORDER — ROSUVASTATIN CALCIUM 5 MG PO TABS: 5.0000 mg | ORAL_TABLET | Freq: Every day | ORAL | 3 refills | Status: DC

## 2019-07-28 NOTE — Telephone Encounter (Signed)
Called patient with results. Patient stated he has not been on any statin for years. Will start crestor 5 mg and send to patient's pharmacy. Put in order for lab work in 3 months and lipid clinic referral. All are schedule for 10/27/19.

## 2019-07-28 NOTE — Telephone Encounter (Signed)
Patient states he is returning call to [facility] in regards to lab results from labs completed on 07/24/19. Please return call to discuss.

## 2019-08-25 ENCOUNTER — Telehealth: Payer: Self-pay | Admitting: Gastroenterology

## 2019-08-25 ENCOUNTER — Other Ambulatory Visit: Payer: Self-pay

## 2019-08-25 MED ORDER — PANTOPRAZOLE SODIUM 20 MG PO TBEC: 20.0000 mg | DELAYED_RELEASE_TABLET | Freq: Two times a day (BID) | ORAL | 1 refills | Status: DC

## 2019-08-25 NOTE — Telephone Encounter (Signed)
Patient calling states the protonix should be 20 mg not 40 mg and he's had to cut it in half please advise

## 2019-08-25 NOTE — Telephone Encounter (Signed)
Called and spoke to patient. He got the call from the pharmacy that I sent a new prescription for the 20 mg tablets. He was very Patent attorney.

## 2019-09-12 ENCOUNTER — Other Ambulatory Visit: Payer: Self-pay | Admitting: Internal Medicine

## 2019-09-12 DIAGNOSIS — B20 Human immunodeficiency virus [HIV] disease: Secondary | ICD-10-CM

## 2019-10-08 ENCOUNTER — Other Ambulatory Visit (HOSPITAL_COMMUNITY): Payer: Self-pay | Admitting: Interventional Radiology

## 2019-10-08 DIAGNOSIS — I771 Stricture of artery: Secondary | ICD-10-CM

## 2019-10-26 NOTE — Progress Notes (Signed)
Patient ID: Jeffrey [name]                 DOB: 1946/01/25                    MRN: [id number]     HPI: Jeffrey [name] is a 74 y.o. male patient referred to lipid clinic by [facility]. PMH is significant for non-obstructive CAD (25-49% plaque in proximal LAD and 25-49% plaque in circumflex) and calcium score of 122 on CT 03/2019 as well as hypertension, HFpEF, right carotid artery stenosis, CKD.   Patient's last visit with Dr. Johnsie Cancel included obtaining lipid panel and subsequently starting pt on rosuvastatin 5 mg daily on 07/28/2019 for an LDL of 148. Jeffrey [name] presents to the lipid clinic for the first time today. He reports he is tolerating rosuvastatin well at the 5 mg dose and that he takes his medications religiously. His diet is listed in detail below but is pretty good as he has replaced most red meat and sugary foods with turkey/chicken, fruits and nuts. He currently doesn't have a formal exercise regimen but stays active by working in the yard. He is interested in getting back into the gym but reports he is still recovering from his Covid infection he had in January. A lipid panel and liver function panel were collected today.   Current Medications: rosuvastatin 5 mg daily  Intolerances: none noted Risk Factors: non-obstructive CAD seen on CT, elevated calcium score LDL goal: <70   Diet: egg, cereal/oatmeal, cooks with olive oil, PB sandwich, chicken, tacos, chili, ground [place], [place] burger, baked pork, nuts, fruits, no sodas, drinks occasional sweet tea but mainly water  Exercise: nothing formal   Family History: HTN and MI in mother   Social History: no current tobacco use, quit chewing tobacco 3 years ago, rare alcohol consumption reports 1 beer this year   Labs:  - lipid panel 07/24/2019: TC 222, Trgs 160, HDL 45, LDL 148 (no lipid lowering therapy) - LFTs 07/24/2019: AST 22, ALT 18, Alkaline phosphatase 82, Tbili 0.3  Past Medical History:  Diagnosis Date  . Chronic kidney disease    . Colon polyps   . GERD (gastroesophageal reflux disease)   . Heart murmur   . History of kidney stones   . HIV (human immunodeficiency virus infection) ([place]) 1999  . Hypertension   . Pneumonia   . Stroke [facility])    right side weakness  . TIA (transient ischemic attack)     Current Outpatient Medications on File Prior to Visit  Medication Sig Dispense Refill  . amLODipine (NORVASC) 10 MG tablet Take 1 tablet (10 mg total) by mouth daily. 90 tablet 3  . Ascorbic Acid (VITAMIN C) 1000 MG tablet Take 1,000 mg by mouth daily.    Marland Kitchen aspirin EC 81 MG tablet Take 1 tablet (81 mg total) by mouth daily.    . clopidogrel (PLAVIX) 75 MG tablet Take 1 tablet (75 mg total) by mouth daily. 90 tablet 3  . finasteride (PROSCAR) 5 MG tablet Take 5 mg by mouth daily.    . Multiple Vitamins-Minerals (VITAMIN D3 COMPLETE PO) Take by mouth.    . pantoprazole (PROTONIX) 20 MG tablet Take 1 tablet (20 mg total) by mouth 2 (two) times daily. You may also take 1/2 a tablet of a 40 mg tablet twice daily if you still have 40 mg tablets to use up 60 tablet 1  . rosuvastatin (CRESTOR) 5 MG tablet Take 1 tablet (  5 mg total) by mouth daily at 6 PM. 90 tablet 3  . TRIUMEQ 600-50-300 MG tablet Take 1 tablet by mouth once daily 30 tablet 5  . Zinc 50 MG TABS Take by mouth.     No current facility-administered medications on file prior to visit.    Allergies  Allergen Reactions  . Baclofen Other (See Comments)    Back pain. Patient said it made him feel crazy   . Efavirenz Rash  . Nevirapine Rash and Other (See Comments)    Assessment/Plan:  1. Hyperlipidemia - uncontrolled hyperlipidemia with most recent LDL of 148 in 07/2019. Goal LDL is <70 due to non-obstructive CAD and plaque noted on CT scan. A lipid and hepatic function panel was collected today. If LDL is still above goal on the lipid panel today and LFTs are within normal limits, will increase rosuvastatin from 5 mg to 20 mg daily. Patient reports just  picking up a new prescription of rosuvastatin last week and therefore will send a new prescription once his current supply is finished. He was encouraged to increase physical activity and to continue with his current diet. Will follow up with patient in 6-8 weeks to re-assess lipid panel.    Eddie Candle, PharmD PGY-1 Pharmacy Resident

## 2019-10-27 ENCOUNTER — Other Ambulatory Visit: Payer: Self-pay

## 2019-10-27 ENCOUNTER — Telehealth: Payer: Self-pay | Admitting: Pharmacist

## 2019-10-27 ENCOUNTER — Ambulatory Visit (INDEPENDENT_AMBULATORY_CARE_PROVIDER_SITE_OTHER): Payer: Medicare HMO | Admitting: Pharmacist

## 2019-10-27 ENCOUNTER — Other Ambulatory Visit: Payer: Self-pay | Admitting: Gastroenterology

## 2019-10-27 ENCOUNTER — Other Ambulatory Visit: Payer: Self-pay | Admitting: Cardiovascular Disease

## 2019-10-27 ENCOUNTER — Other Ambulatory Visit: Payer: Medicare HMO | Admitting: *Deleted

## 2019-10-27 DIAGNOSIS — E785 Hyperlipidemia, unspecified: Secondary | ICD-10-CM

## 2019-10-27 DIAGNOSIS — E782 Mixed hyperlipidemia: Secondary | ICD-10-CM

## 2019-10-27 DIAGNOSIS — I2510 Atherosclerotic heart disease of native coronary artery without angina pectoris: Secondary | ICD-10-CM

## 2019-10-27 LAB — LIPID PANEL
Chol/HDL Ratio: 3.4 ratio (ref 0.0–5.0)
Cholesterol, Total: 147 mg/dL (ref 100–199)
HDL: 43 mg/dL (ref >39)
LDL Chol Calc (NIH): 82 mg/dL (ref 0–99)
Triglycerides: 121 mg/dL (ref 0–149)
VLDL Cholesterol Cal: 22 mg/dL (ref 5–40)

## 2019-10-27 LAB — HEPATIC FUNCTION PANEL
ALT: 13 IU/L (ref 0–44)
AST: 16 IU/L (ref 0–40)
Albumin: 4.1 g/dL (ref 3.7–4.7)
Alkaline Phosphatase: 95 IU/L (ref 48–121)
Bilirubin Total: 0.3 mg/dL (ref 0.0–1.2)
Bilirubin, Direct: 0.11 mg/dL (ref 0.00–0.40)
Total Protein: 6.6 g/dL (ref 6.0–8.5)

## 2019-10-27 MED ORDER — ROSUVASTATIN CALCIUM 20 MG PO TABS
20.0000 mg | ORAL_TABLET | Freq: Every day | ORAL | 3 refills | Status: DC
Start: 2019-10-27 — End: 2020-11-30

## 2019-10-27 NOTE — Patient Instructions (Signed)
It was great meeting you in clinic today!   Your goal LDL is less than 70, your LDL in March was 148.  We will call you with the results of your lipid panel. We will send a new prescription to the pharmacy if we need to increase the rosuvastatin dose.   Continue eating well and start increasing your physical activity as tolerated.   Call the office if you have any questions or concerns [phone]

## 2019-10-27 NOTE — Telephone Encounter (Signed)
LDL has improved from baseline of 148 to 82 since starting rosuvastatin 52m daily (45% reduction). LDL goal < 70 due to CAD noted on calcium score. LFTs stable. Pt has been improving his diet and trying to stay more active. Will increase rosuvastatin to 234mdaily (should lower LDL an extra ~12%) and recheck lipids in 3 months. Pt is in agreement with plan.

## 2019-10-30 ENCOUNTER — Ambulatory Visit (HOSPITAL_COMMUNITY)
Admission: RE | Admit: 2019-10-30 | Discharge: 2019-10-30 | Disposition: A | Discharge status: Home / Self Care | Payer: Medicare HMO | Source: Ambulatory Visit | Attending: Interventional Radiology | Admitting: Interventional Radiology

## 2019-10-30 ENCOUNTER — Other Ambulatory Visit: Payer: Self-pay

## 2019-10-30 DIAGNOSIS — I6602 Occlusion and stenosis of left middle cerebral artery: Secondary | ICD-10-CM | POA: Diagnosis not present

## 2019-10-30 DIAGNOSIS — I6521 Occlusion and stenosis of right carotid artery: Secondary | ICD-10-CM | POA: Diagnosis not present

## 2019-10-30 DIAGNOSIS — I771 Stricture of artery: Secondary | ICD-10-CM | POA: Diagnosis present

## 2019-10-30 DIAGNOSIS — I6603 Occlusion and stenosis of bilateral middle cerebral arteries: Secondary | ICD-10-CM | POA: Insufficient documentation

## 2019-10-30 DIAGNOSIS — I6622 Occlusion and stenosis of left posterior cerebral artery: Secondary | ICD-10-CM | POA: Insufficient documentation

## 2019-11-04 ENCOUNTER — Other Ambulatory Visit (HOSPITAL_COMMUNITY): Payer: Self-pay | Admitting: Interventional Radiology

## 2019-11-04 DIAGNOSIS — I771 Stricture of artery: Secondary | ICD-10-CM

## 2019-11-06 ENCOUNTER — Telehealth (HOSPITAL_COMMUNITY): Payer: Self-pay

## 2019-11-06 NOTE — Telephone Encounter (Signed)
Called to reschedule consult, no answer, left vm. AW

## 2019-11-12 ENCOUNTER — Ambulatory Visit (HOSPITAL_COMMUNITY): Payer: Medicare HMO

## 2019-11-18 ENCOUNTER — Other Ambulatory Visit: Payer: Self-pay

## 2019-11-18 ENCOUNTER — Ambulatory Visit (HOSPITAL_COMMUNITY)
Admission: RE | Admit: 2019-11-18 | Discharge: 2019-11-18 | Disposition: A | Discharge status: Home / Self Care | Payer: Medicare HMO | Source: Ambulatory Visit | Attending: Interventional Radiology | Admitting: Interventional Radiology

## 2019-11-18 DIAGNOSIS — I771 Stricture of artery: Secondary | ICD-10-CM

## 2019-11-18 NOTE — Progress Notes (Signed)
Chief Complaint: Patient was seen in consultation today for MRA follow-up.  Referring Physician(s): Josue Hector  Supervising Physician: Luanne Bras  Patient Status: [facility] - Out-pt  History of Present Illness: Jeffrey [name] is a 74 y.o. male with a past medical history as below, with pertinent past medical history including hypertension, TIA, CVA, pneumonia, GERD, nephrolithiasis, CKD, and HIV. He is known to [facility] and has been followed by Dr. Estanislado Pandy since 04/2019. He first presented to our department at the request of Dr. Johnsie Cancel for management of intracranial stenosis. He underwent an image-guided cerebral arteriogram with revascularization of right ICA supraclinoid segment stenosis using balloon angioplasty 05/14/2019 by Dr. Estanislado Pandy. He has since been followed by routine imaging scans to monitor for changes.  MRA head 10/30/2019: 1. Approximately 60% stenosis of the paraclinoid segment of the right ICA. 2. Moderate stenosis of the right M2/MCA posterior division branch. 3. Moderate stenosis at the origin of a left M2/MCA superior division branch. 4. Moderate stenosis at the left P1-P2/PCA and moderate to severe at P2A-P2P/PCA.  Patient presents today for follow-up to discuss findings of recent MRA head 10/30/2019. Patient awake and alert sitting in chair. Complains of intermittent right hand weakness. Complains of "feeling off balance". Complains of intermittent dyspnea since COVID-19 infection. States that he has a home RN coming to check on this. Denies headache, left-sided weakness, numbness/tingling, dizziness, vision changes, hearing changes, tinnitus, or speech difficulties.  Currently taking Plavix 75 mg once daily and Aspirin 81 mg once daily.   Past Medical History:  Diagnosis Date  . Chronic kidney disease   . Colon polyps   . GERD (gastroesophageal reflux disease)   . Heart murmur   . History of kidney stones   . HIV (human immunodeficiency virus  infection) ([place]) 1999  . Hypertension   . Pneumonia   . Stroke [facility])    right side weakness  . TIA (transient ischemic attack)     Past Surgical History:  Procedure Laterality Date  . CHOLECYSTECTOMY  1982  . COLONOSCOPY    . ESOPHAGEAL MANOMETRY N/A 09/03/2017   Procedure: ESOPHAGEAL MANOMETRY (EM);  Surgeon: Mauri Pole, MD;  Location: [facility];  Service: Endoscopy;  Laterality: N/A;  . IR ANGIO INTRA EXTRACRAN SEL COM CAROTID INNOMINATE BILAT MOD SED  04/21/2019  . IR ANGIO VERTEBRAL SEL VERTEBRAL BILAT MOD SED  04/21/2019  . IR PTA INTRACRANIAL  05/14/2019  . KNEE SURGERY     LEFT KNEE  . RADIOLOGY WITH ANESTHESIA N/A 05/14/2019   Procedure: IR WITH ANESTHESIA STENTING;  Surgeon: Luanne Bras, MD;  Location: [place];  Service: Radiology;  Laterality: N/A;  . TONSILLECTOMY      Allergies: Baclofen, Efavirenz, and Nevirapine  Medications: Prior to Admission medications   Medication Sig Start Date End Date Taking? Authorizing Provider  amLODipine (NORVASC) 10 MG tablet Take 1 tablet (10 mg total) by mouth daily. 04/23/19   Josue Hector, MD  Ascorbic Acid (VITAMIN C) 1000 MG tablet Take 1,000 mg by mouth daily.    [provider]  aspirin EC 81 MG tablet Take 1 tablet (81 mg total) by mouth daily. 10/11/15   Karamalegos, Devonne Doughty, DO  clopidogrel (PLAVIX) 75 MG tablet Take 1 tablet (75 mg total) by mouth daily. 04/07/19   Josue Hector, MD  finasteride (PROSCAR) 5 MG tablet Take 5 mg by mouth daily.    [provider]  Multiple Vitamins-Minerals (VITAMIN D3 COMPLETE PO) Take by mouth.  [provider]  pantoprazole (PROTONIX) 20 MG tablet Take 1 tablet by mouth twice daily 10/27/19   Armbruster, Carlota Raspberry, MD  rosuvastatin (CRESTOR) 20 MG tablet Take 1 tablet (20 mg total) by mouth daily. 10/27/19   Josue Hector, MD  TRIUMEQ 600-50-300 MG tablet Take 1 tablet by mouth once daily 09/15/19   [name], MD  Zinc 50 MG TABS Take by  mouth.    [provider]     Family History  Problem Relation Age of Onset  . Hypertension Mother   . Heart attack Mother 107  . Colon cancer Neg Hx   . Esophageal cancer Neg Hx   . Pancreatic cancer Neg Hx   . Stomach cancer Neg Hx   . Liver disease Neg Hx     Social History   Socioeconomic History  . Marital status: Divorced    Spouse name: Not on file  . Number of children: Not on file  . Years of education: Not on file  . Highest education level: Not on file  Occupational History  . Occupation: retired    Fish farm manager: [employer]  Tobacco Use  . Smoking status: Never Smoker  . Smokeless tobacco: Former Systems developer    Types: Chew  . Tobacco comment: Rare use, about q 6 months  Vaping Use  . Vaping Use: Never used  Substance and Sexual Activity  . Alcohol use: Yes    Comment: rare  . Drug use: No  . Sexual activity: Yes    Partners: Female    Comment: declined condoms  Other Topics Concern  . Not on file  Social History Narrative  . Not on file   Social Determinants of Health   Financial Resource Strain:   . Difficulty of Paying Living Expenses:   Food Insecurity:   . Worried About Charity fundraiser in the Last Year:   . Arboriculturist in the Last Year:   Transportation Needs:   . Film/video editor (Medical):   Marland Kitchen Lack of Transportation (Non-Medical):   Physical Activity:   . Days of Exercise per Week:   . Minutes of Exercise per Session:   Stress:   . Feeling of Stress :   Social Connections:   . Frequency of Communication with Friends and Family:   . Frequency of Social Gatherings with Friends and Family:   . Attends Religious Services:   . Active Member of Clubs or Organizations:   . Attends Archivist Meetings:   Marland Kitchen Marital Status:      Review of Systems: A 12 point ROS discussed and pertinent positives are indicated in the HPI above.  All other systems are negative.  Review of Systems  Constitutional: Negative for chills and  fever.  HENT: Negative for hearing loss and tinnitus.   Eyes: Negative for visual disturbance.  Respiratory: Positive for shortness of breath. Negative for wheezing.   Cardiovascular: Negative for chest pain and palpitations.  Neurological: Positive for weakness. Negative for dizziness, speech difficulty, numbness and headaches.  Psychiatric/Behavioral: Negative for behavioral problems and confusion.    Vital Signs: There were no vitals taken for this visit.  Physical Exam Constitutional:      General: He is not in acute distress.    Appearance: Normal appearance.  Pulmonary:     Effort: Pulmonary effort is normal. No respiratory distress.  Skin:    General: Skin is warm and dry.  Neurological:     Mental Status: He  is alert and oriented to person, place, and time.      Imaging: MR ANGIO HEAD WO CONTRAST  Result Date: 10/31/2019 CLINICAL DATA:  Stenosis of artery. EXAM: MRA HEAD WITHOUT CONTRAST TECHNIQUE: Angiographic images of the Circle of Willis were obtained using MRA technique without intravenous contrast. COMPARISON:  MR angiogram April 02, 2019 FINDINGS: Approximately 60% stenosis of the paraclinoid segment of the right ICA. No significant stenosis of the intracranial left ICA. A 2 mm outpouching projecting posteriorly from the communicating segment of the left ICA, likely an infundibulum. Moderate stenosis of the right M2/MCA posterior division branch. Moderate stenosis at the origin of a left M2/MCA superior division branch. Hypoplastic right A1/ACA segment. Otherwise, the bilateral ACA vascular tree is unremarkable. Moderate stenosis at the left P1-P2/PCA and moderate to severe at the P2A-P2P/PCA. The right PCA appear preserved. The basilar artery and intracranial segment of the bilateral vertebral artery showed normal caliber and flow related enhancement. Findings are not significantly changed from prior MRA. IMPRESSION: 1. Approximately 60% stenosis of the paraclinoid  segment of the right ICA. 2. Moderate stenosis of the right M2/MCA posterior division branch. 3. Moderate stenosis at the origin of a left M2/MCA superior division branch. 4. Moderate stenosis at the left P1-P2/PCA and moderate to severe at P2A-P2P/PCA. Electronically Signed   By: Pedro Earls M.D.   On: 10/31/2019 15:46    Labs:  CBC: Recent Labs    04/21/19 0654 05/14/19 0645 05/15/19 0530 05/30/19 1800  WBC 6.4 5.3 8.0 5.3  HGB 13.9 14.4 12.4* 14.1  HCT 41.1 41.6 35.4* 40.6  PLT 208 174 189 198    COAGS: Recent Labs    04/21/19 0654 05/14/19 0645  INR 0.9 1.0  APTT  --  30    BMP: Recent Labs    04/21/19 0654 05/14/19 0645 05/15/19 0530 05/30/19 1800  NA 142 139 140 134*  K 3.5 3.6 3.6 3.3*  CL 109 107 107 101  CO2 24 24 23 22   GLUCOSE 107* 103* 128* 120*  BUN 21 29* 31* 27*  CALCIUM 9.3 9.2 8.6* 8.5*  CREATININE 1.56* 1.41* 1.38* 1.54*  GFRNONAA 44* 50* 51* 45*  GFRAA 51* 58* 59* 52*    LIVER FUNCTION TESTS: Recent Labs    11/26/18 1638 11/26/18 1638 02/10/19 0911 05/30/19 1800 07/24/19 0831 10/27/19 0811  BILITOT 0.3  --  0.4 0.7 0.3 0.3  AST 18   < > 17 56* 22 16  ALT 16   < > 18 57* 18 13  ALKPHOS 77  --   --  78 82 95  PROT 6.6  --  6.3 6.9 6.6 6.6  ALBUMIN 4.3  --   --  3.1* 4.2 4.1   < > = values in this interval not displayed.     Assessment and Plan:  Right ICA supraclinoid segment stenosis s/p revascularization using balloon angioplasty 05/14/2019 by Dr. Estanislado Pandy. Dr. Estanislado Pandy was present for consultation.  Discussed symptoms. Patient is grossly asymptomatic at this time. Discussed MRA findings, specifically right ICA paraclinoid stenosis. Since patient is grossly asymptomatic at this time, recommend continued conservative management including routine imaging scans to monitor for changes. Plan for follow-up with MRI/MRA brain/head (without contrast) in 4 months. Informed patient that our schedulers will call him to  set up this imaging scan. Instructed patient to continue taking Plavix 75 mg once daily and Aspirin 81 mg once daily. Instructed patient to call 911 if he notices stroke like symptoms.  All questions answered and concerns addressed. Patient conveys understanding and agrees with plan.  Thank you for this interesting consult.  I greatly enjoyed meeting SEVERINO PAOLO and look forward to participating in their care.  A copy of this report was sent to the requesting provider on this date.  Electronically Signed: Earley Abide, PA-C 11/18/2019, 10:30 AM   I spent a total of 25 Minutes in face to face in clinical consultation, greater than 50% of which was counseling/coordinating care for MRA follow-up.

## 2019-11-19 DIAGNOSIS — B20 Human immunodeficiency virus [HIV] disease: Secondary | ICD-10-CM | POA: Diagnosis not present

## 2019-11-19 DIAGNOSIS — I6521 Occlusion and stenosis of right carotid artery: Secondary | ICD-10-CM | POA: Diagnosis not present

## 2019-11-19 DIAGNOSIS — I10 Essential (primary) hypertension: Secondary | ICD-10-CM | POA: Diagnosis not present

## 2019-11-19 DIAGNOSIS — E785 Hyperlipidemia, unspecified: Secondary | ICD-10-CM | POA: Diagnosis not present

## 2019-11-19 DIAGNOSIS — I5030 Unspecified diastolic (congestive) heart failure: Secondary | ICD-10-CM | POA: Diagnosis not present

## 2019-11-19 DIAGNOSIS — N189 Chronic kidney disease, unspecified: Secondary | ICD-10-CM | POA: Diagnosis not present

## 2019-12-16 ENCOUNTER — Telehealth: Payer: Self-pay | Admitting: Cardiovascular Disease

## 2019-12-16 ENCOUNTER — Other Ambulatory Visit: Payer: Self-pay

## 2019-12-16 ENCOUNTER — Emergency Department (HOSPITAL_COMMUNITY)
Admission: EM | Admit: 2019-12-16 | Discharge: 2019-12-17 | Disposition: A | Discharge status: Home / Self Care | Payer: Medicare HMO | Attending: Emergency Medicine | Admitting: Emergency Medicine

## 2019-12-16 ENCOUNTER — Encounter: Payer: Self-pay | Admitting: Emergency Medicine

## 2019-12-16 ENCOUNTER — Emergency Department (HOSPITAL_COMMUNITY): Payer: Medicare HMO

## 2019-12-16 ENCOUNTER — Ambulatory Visit
Admission: EM | Admit: 2019-12-16 | Discharge: 2019-12-16 | Disposition: A | Discharge status: Home / Self Care | Payer: Medicare HMO | Attending: Emergency Medicine | Admitting: Emergency Medicine

## 2019-12-16 ENCOUNTER — Encounter (HOSPITAL_COMMUNITY): Payer: Self-pay | Admitting: Emergency Medicine

## 2019-12-16 DIAGNOSIS — I2510 Atherosclerotic heart disease of native coronary artery without angina pectoris: Secondary | ICD-10-CM | POA: Diagnosis not present

## 2019-12-16 DIAGNOSIS — R519 Headache, unspecified: Secondary | ICD-10-CM | POA: Diagnosis not present

## 2019-12-16 DIAGNOSIS — L03115 Cellulitis of right lower limb: Secondary | ICD-10-CM | POA: Insufficient documentation

## 2019-12-16 DIAGNOSIS — N189 Chronic kidney disease, unspecified: Secondary | ICD-10-CM | POA: Insufficient documentation

## 2019-12-16 DIAGNOSIS — Z79899 Other long term (current) drug therapy: Secondary | ICD-10-CM | POA: Diagnosis not present

## 2019-12-16 DIAGNOSIS — I10 Essential (primary) hypertension: Secondary | ICD-10-CM | POA: Diagnosis not present

## 2019-12-16 DIAGNOSIS — I5030 Unspecified diastolic (congestive) heart failure: Secondary | ICD-10-CM | POA: Insufficient documentation

## 2019-12-16 DIAGNOSIS — I130 Hypertensive heart and chronic kidney disease with heart failure and stage 1 through stage 4 chronic kidney disease, or unspecified chronic kidney disease: Secondary | ICD-10-CM | POA: Diagnosis not present

## 2019-12-16 DIAGNOSIS — Z7982 Long term (current) use of aspirin: Secondary | ICD-10-CM | POA: Diagnosis not present

## 2019-12-16 DIAGNOSIS — H538 Other visual disturbances: Secondary | ICD-10-CM | POA: Insufficient documentation

## 2019-12-16 DIAGNOSIS — M79601 Pain in right arm: Secondary | ICD-10-CM

## 2019-12-16 DIAGNOSIS — M7918 Myalgia, other site: Secondary | ICD-10-CM | POA: Insufficient documentation

## 2019-12-16 DIAGNOSIS — R2241 Localized swelling, mass and lump, right lower limb: Secondary | ICD-10-CM | POA: Diagnosis present

## 2019-12-16 DIAGNOSIS — G8929 Other chronic pain: Secondary | ICD-10-CM

## 2019-12-16 DIAGNOSIS — M79604 Pain in right leg: Secondary | ICD-10-CM

## 2019-12-16 LAB — COMPREHENSIVE METABOLIC PANEL
ALT: 17 U/L (ref 0–44)
AST: 18 U/L (ref 15–41)
Albumin: 4 g/dL (ref 3.5–5.0)
Alkaline Phosphatase: 76 U/L (ref 38–126)
Anion gap: 10 (ref 5–15)
BUN: 23 mg/dL (ref 8–23)
CO2: 24 mmol/L (ref 22–32)
Calcium: 9.6 mg/dL (ref 8.9–10.3)
Chloride: 104 mmol/L (ref 98–111)
Creatinine, Ser: 1.3 mg/dL — ABNORMAL HIGH (ref 0.61–1.24)
GFR calc Af Amer: >60 mL/min (ref >60)
GFR calc non Af Amer: 55 mL/min — ABNORMAL LOW (ref >60)
Glucose, Bld: 104 mg/dL — ABNORMAL HIGH (ref 70–99)
Potassium: 4 mmol/L (ref 3.5–5.1)
Sodium: 138 mmol/L (ref 135–145)
Total Bilirubin: 0.8 mg/dL (ref 0.3–1.2)
Total Protein: 7.2 g/dL (ref 6.5–8.1)

## 2019-12-16 LAB — CBC WITH DIFFERENTIAL/PLATELET
Abs Immature Granulocytes: 0.03 10*3/uL (ref 0.00–0.07)
Basophils Absolute: 0 10*3/uL (ref 0.0–0.1)
Basophils Relative: 0 %
Eosinophils Absolute: 0 10*3/uL (ref 0.0–0.5)
Eosinophils Relative: 0 %
HCT: 38.8 % — ABNORMAL LOW (ref 39.0–52.0)
Hemoglobin: 12.8 g/dL — ABNORMAL LOW (ref 13.0–17.0)
Immature Granulocytes: 0 %
Lymphocytes Relative: 21 %
Lymphs Abs: 2.1 10*3/uL (ref 0.7–4.0)
MCH: 33 pg (ref 26.0–34.0)
MCHC: 33 g/dL (ref 30.0–36.0)
MCV: 100 fL (ref 80.0–100.0)
Monocytes Absolute: 0.7 10*3/uL (ref 0.1–1.0)
Monocytes Relative: 7 %
Neutro Abs: 7.3 10*3/uL (ref 1.7–7.7)
Neutrophils Relative %: 72 %
Platelets: 204 10*3/uL (ref 150–400)
RBC: 3.88 MIL/uL — ABNORMAL LOW (ref 4.22–5.81)
RDW: 13.7 % (ref 11.5–15.5)
WBC: 10.1 10*3/uL (ref 4.0–10.5)
nRBC: 0 % (ref 0.0–0.2)

## 2019-12-16 NOTE — Telephone Encounter (Signed)
New Message:    Pt have had headaches since last Thursday, right arm and rt leg pain, and blurred vision in left eye. He went to Urgent Care on [address] [facility] today. He was told to call and see what Dr Johnsie Cancel wanted him to do, go to ER or see Dr Johnsie Cancel.

## 2019-12-16 NOTE — ED Provider Notes (Signed)
[name]    CSN: [id number] Arrival date & time: 12/16/19  1413      History   Chief Complaint Chief Complaint  Patient presents with  . Headache    HPI Jeffrey [name] is a 74 y.o. male   Subjective:  Jeffrey [name] is a 74 y.o. male who presents for evaluation of headache. Symptoms began about 5 days ago. Rapidity of onset was gradual. The patient gets headaches occasionally. The headache is described as moderate and is right-sided unilateral in location.  Precipitating factors include none which have been determined. The headache was not preceded by an aura. Associated neurologic symptoms which are present include vision problems. The patient denies dizziness, muscle weakness, numbness of extremities and speech difficulties. Other associated symptoms include hoarseness.  Patient denies abdominal pain, chest pain, cough, earache, fever, irritability, neck stiffness, photophobia, rash, sore throat and vomiting. Home treatment has included acetaminophen with inadequate improvement. Other history includes: ischemic stroke x 2, carotid endarterectomy (L). The following portions of the patient's history were reviewed and updated as appropriate: allergies, current medications, past family history, past medical history, past social history, past surgical history and problem list.     Past Medical History:  Diagnosis Date  . Chronic kidney disease   . Colon polyps   . GERD (gastroesophageal reflux disease)   . Heart murmur   . History of kidney stones   . HIV (human immunodeficiency virus infection) ([place]) 1999  . Hypertension   . Pneumonia   . Stroke [facility])    right side weakness  . TIA (transient ischemic attack)     Patient Active Problem List   Diagnosis Date Noted  . CAD (coronary artery disease) 10/27/2019  . Internal carotid artery stenosis, right 05/14/2019  . Chest pain 02/24/2019  . Peripheral vertigo 11/29/2018  . Chronic kidney disease 12/20/2017  .  Diastolic CHF with preserved left ventricular function, NYHA class 2 ([place]) 01/06/2015  . Exertional dyspnea 09/30/2014  . Insomnia 07/22/2014  . BPH with obstruction/lower urinary tract symptoms 06/17/2014  . Hyperlipidemia 07/22/2012  . Erectile dysfunction 07/22/2012  . GERD 05/17/2010  . Dysphagia 03/08/2009  . CERVICAL LYMPHADENOPATHY 09/19/2007  . CHOLECYSTECTOMY, HX OF 05/28/2006  . Human immunodeficiency virus (HIV) disease ([place]) 02/27/2006  . Essential hypertension 02/27/2006  . PNEUMONIA, HX OF 02/27/2006  . ARTHROSCOPY, KNEE, HX OF 02/27/2006    Past Surgical History:  Procedure Laterality Date  . CHOLECYSTECTOMY  1982  . COLONOSCOPY    . ESOPHAGEAL MANOMETRY N/A 09/03/2017   Procedure: ESOPHAGEAL MANOMETRY (EM);  Surgeon: Mauri Pole, MD;  Location: [facility];  Service: Endoscopy;  Laterality: N/A;  . IR ANGIO INTRA EXTRACRAN SEL COM CAROTID INNOMINATE BILAT MOD SED  04/21/2019  . IR ANGIO VERTEBRAL SEL VERTEBRAL BILAT MOD SED  04/21/2019  . IR PTA INTRACRANIAL  05/14/2019  . KNEE SURGERY     LEFT KNEE  . RADIOLOGY WITH ANESTHESIA N/A 05/14/2019   Procedure: IR WITH ANESTHESIA STENTING;  Surgeon: Luanne Bras, MD;  Location: [place];  Service: Radiology;  Laterality: N/A;  . TONSILLECTOMY         Home Medications    Prior to Admission medications   Medication Sig Start Date End Date Taking? Authorizing Provider  amLODipine (NORVASC) 10 MG tablet Take 1 tablet (10 mg total) by mouth daily. 04/23/19   Josue Hector, MD  Ascorbic Acid (VITAMIN C) 1000 MG tablet Take 1,000 mg by mouth daily.    [provider]  aspirin EC 81 MG tablet Take 1 tablet (81 mg total) by mouth daily. 10/11/15   Karamalegos, Devonne Doughty, DO  clopidogrel (PLAVIX) 75 MG tablet Take 1 tablet (75 mg total) by mouth daily. 04/07/19   Josue Hector, MD  finasteride (PROSCAR) 5 MG tablet Take 5 mg by mouth daily.    [provider]  Multiple Vitamins-Minerals  (VITAMIN D3 COMPLETE PO) Take by mouth.    [provider]  pantoprazole (PROTONIX) 20 MG tablet Take 1 tablet by mouth twice daily 10/27/19   Armbruster, Carlota Raspberry, MD  rosuvastatin (CRESTOR) 20 MG tablet Take 1 tablet (20 mg total) by mouth daily. 10/27/19   Josue Hector, MD  TRIUMEQ 600-50-300 MG tablet Take 1 tablet by mouth once daily 09/15/19   [name], MD  Zinc 50 MG TABS Take by mouth.    [provider]    Family History Family History  Problem Relation Age of Onset  . Hypertension Mother   . Heart attack Mother 82  . Colon cancer Neg Hx   . Esophageal cancer Neg Hx   . Pancreatic cancer Neg Hx   . Stomach cancer Neg Hx   . Liver disease Neg Hx     Social History Social History   Tobacco Use  . Smoking status: Never Smoker  . Smokeless tobacco: Former Systems developer    Types: Chew  . Tobacco comment: Rare use, about q 6 months  Vaping Use  . Vaping Use: Never used  Substance Use Topics  . Alcohol use: Yes    Comment: rare  . Drug use: No     Allergies   Baclofen, Efavirenz, and Nevirapine   Review of Systems As per HPI   Physical Exam Triage Vital Signs ED Triage Vitals  Enc Vitals Group     BP 12/16/19 1424 126/81     Pulse Rate 12/16/19 1424 97     Resp 12/16/19 1424 18     Temp 12/16/19 1424 98.3 F (36.8 C)     Temp Source 12/16/19 1424 Oral     SpO2 12/16/19 1424 95 %     Weight --      Height --      Head Circumference --      Peak Flow --      Pain Score 12/16/19 1425 6     Pain Loc --      Pain Edu? --      Excl. in [place]? --    No data found.  Updated Vital Signs BP 126/81 (BP Location: Left Arm)   Pulse 97   Temp 98.3 F (36.8 C) (Oral)   Resp 18   SpO2 95%   Visual Acuity Right Eye Distance: 20/100 Left Eye Distance: 20/200 Bilateral Distance: 2025 (did test multiple times with glasses on and off; PA aware of results )  Right Eye Near:   Left Eye Near:    Bilateral Near:     Physical  Exam Constitutional:      General: He is not in acute distress.    Appearance: He is well-developed. He is not ill-appearing.  HENT:     Head: Normocephalic and atraumatic.     Right Ear: Tympanic membrane, ear canal and external ear normal.     Left Ear: Tympanic membrane, ear canal and external ear normal.     Mouth/Throat:     Mouth: Mucous membranes are moist.     Pharynx: Oropharynx is clear.  No oropharyngeal exudate or posterior oropharyngeal erythema.  Eyes:     General: No scleral icterus.    Extraocular Movements: Extraocular movements intact.     Conjunctiva/sclera: Conjunctivae normal.     Pupils: Pupils are equal, round, and reactive to light.     Comments: Decreased vision L eye w/ glasses  Cardiovascular:     Rate and Rhythm: Normal rate and regular rhythm.  Pulmonary:     Effort: Pulmonary effort is normal. No respiratory distress.     Breath sounds: No wheezing or rales.  Abdominal:     Tenderness: There is no abdominal tenderness.  Musculoskeletal:        General: No swelling or deformity. Normal range of motion.     Cervical back: Normal range of motion. No rigidity or tenderness.  Lymphadenopathy:     Cervical: No cervical adenopathy.  Skin:    General: Skin is warm.     Capillary Refill: Capillary refill takes less than 2 seconds.     Coloration: Skin is not cyanotic or jaundiced.     Findings: No bruising or rash.  Neurological:     Mental Status: He is alert and oriented to person, place, and time.     Cranial Nerves: Cranial nerves are intact. No cranial nerve deficit, dysarthria or facial asymmetry.     Sensory: Sensation is intact.     Motor: Motor function is intact.     Coordination: Coordination is intact.     Gait: Gait is intact.     Deep Tendon Reflexes: Reflexes normal.  Psychiatric:        Mood and Affect: Mood normal. Mood is not anxious.        Behavior: Behavior normal.        Cognition and Memory: Cognition is not impaired. Memory is  not impaired.      UC Treatments / Results  Labs (all labs ordered are listed, but only abnormal results are displayed) Labs Reviewed - No data to display  EKG   Radiology No results found.  Procedures Procedures (including critical care time)  Medications Ordered in UC Medications - No data to display  Initial Impression / Assessment and Plan / UC Course  I have reviewed the triage vital signs and the nursing notes.  Pertinent labs & imaging results that were available during my care of the patient were reviewed by me and considered in my medical decision making (see chart for details).     Afebrile, nontoxic in office today.  Patient has had symptoms for greater than 4 days, though H&P is concerning for possibility of TIA/CVA.  Low concern for shingles.  Patient does have altered baseline given previous CVA x2 and history of left carotid endarterectomy.  Followed by neurology and cardiology.  Discussed utility of going to ER for further evaluation/management: Patient declined, electing to contact his cardiologist "to see if there is anything else he can do ".   ER return precautions discussed, pt verbalized understanding and is agreeable to plan. Final Clinical Impressions(s) / UC Diagnoses   Final diagnoses:  Acute nonintractable headache, unspecified headache type  Right arm pain  Right leg pain  Blurred vision, left eye   Discharge Instructions   None    ED Prescriptions    None     PDMP not reviewed this encounter.   Hall-Potvin, [place], [place] 12/16/19 1507

## 2019-12-16 NOTE — Telephone Encounter (Signed)
Called patient back. Patient stated for the last 4 days he has had a severe headache, blurred vision in his left eye, and right leg/arm numbness that comes and goes. Informed patient that these could be signs and symptoms of a stroke and he needs to go to the ED, like urgent care advised. Informed patient that a message would be sent to Dr. Johnsie Cancel as well. Patient agreed to plan and will go to ED.

## 2019-12-16 NOTE — ED Triage Notes (Signed)
Pt c/o headache in back of head radiating down to neck and right arm  Onset 6 days ago.  Also c/o blurry vision in left eye also 3 days ago.  Pt has pain and redness in right lower leg onset yesterday

## 2019-12-16 NOTE — ED Triage Notes (Signed)
Pt here for HA to right side of head in back down neck into right arm and also down right leg x 4 days; pt denies rash

## 2019-12-17 ENCOUNTER — Emergency Department (HOSPITAL_BASED_OUTPATIENT_CLINIC_OR_DEPARTMENT_OTHER): Payer: Medicare HMO

## 2019-12-17 DIAGNOSIS — M7989 Other specified soft tissue disorders: Secondary | ICD-10-CM | POA: Diagnosis not present

## 2019-12-17 DIAGNOSIS — L03115 Cellulitis of right lower limb: Secondary | ICD-10-CM | POA: Diagnosis not present

## 2019-12-17 LAB — SEDIMENTATION RATE: Sed Rate: 30 mm/hr — ABNORMAL HIGH (ref 0–16)

## 2019-12-17 MED ORDER — DOXYCYCLINE HYCLATE 50 MG PO CAPS: 50.0000 mg | ORAL_CAPSULE | Freq: Two times a day (BID) | ORAL | 0 refills | Status: DC

## 2019-12-17 NOTE — ED Notes (Signed)
Patient verbalizes understanding of discharge instructions. Opportunity for questioning and answers were provided. Armband removed by staff, pt discharged from ED ambulatory.   

## 2019-12-17 NOTE — ED Provider Notes (Signed)
[facility] EMERGENCY DEPARTMENT Provider Note   CSN: [id number] Arrival date & time: 12/16/19  1729   History Chief Complaint  Patient presents with  . Headache    Jeffrey [name] is a 74 y.o. male.  Jeffrey [name] is a 74 year old man with past medical history significant for HTN, prior CVA x2, HLD and HIV who presents to the [facility] for evaluation of headache, blurry vision and swelling in his leg. He states that since February, he has been experiencing daily "thumping" headaches that vary in intensity. The headaches are always localized to the right posterior aspect of his head and tend to radiate down his neck. Approximately ten days ago, the headaches began to increase in intensity to a severity of 6 to 10 out of 10. He denies associated aura or visual symptoms prior to the onset of his headaches. He states that he regularly takes upwards of 4-6 extra strength tylenol if his headaches are particularly bothersome. Additionally, he complains of blurry vision of his left eye which "comes and goes" and is not associated with his headaches. He has not experienced redness or pain in this eye. He denies any difficulty with vision in his right right eye. He states that both his vision and headache have resolved overnight without intervention. He has not seen an optometrist recently. Lastly, he states yesterday morning he developed redness and swelling of his right lower extremity. He describes the leg as feeling hot, but not painful, and he has intact sensation.      Past Medical History:  Diagnosis Date  . Chronic kidney disease   . Colon polyps   . GERD (gastroesophageal reflux disease)   . Heart murmur   . History of kidney stones   . HIV (human immunodeficiency virus infection) ([place]) 1999  . Hypertension   . Pneumonia   . Stroke [facility])    right side weakness  . TIA (transient ischemic attack)    Patient Active Problem List   Diagnosis Date Noted  . CAD (coronary  artery disease) 10/27/2019  . Internal carotid artery stenosis, right 05/14/2019  . Chest pain 02/24/2019  . Peripheral vertigo 11/29/2018  . Chronic kidney disease 12/20/2017  . Diastolic CHF with preserved left ventricular function, NYHA class 2 ([place]) 01/06/2015  . Exertional dyspnea 09/30/2014  . Insomnia 07/22/2014  . BPH with obstruction/lower urinary tract symptoms 06/17/2014  . Hyperlipidemia 07/22/2012  . Erectile dysfunction 07/22/2012  . GERD 05/17/2010  . Dysphagia 03/08/2009  . CERVICAL LYMPHADENOPATHY 09/19/2007  . CHOLECYSTECTOMY, HX OF 05/28/2006  . Human immunodeficiency virus (HIV) disease ([place]) 02/27/2006  . Essential hypertension 02/27/2006  . PNEUMONIA, HX OF 02/27/2006  . ARTHROSCOPY, KNEE, HX OF 02/27/2006   Past Surgical History:  Procedure Laterality Date  . CHOLECYSTECTOMY  1982  . COLONOSCOPY    . ESOPHAGEAL MANOMETRY N/A 09/03/2017   Procedure: ESOPHAGEAL MANOMETRY (EM);  Surgeon: Mauri Pole, MD;  Location: [facility];  Service: Endoscopy;  Laterality: N/A;  . IR ANGIO INTRA EXTRACRAN SEL COM CAROTID INNOMINATE BILAT MOD SED  04/21/2019  . IR ANGIO VERTEBRAL SEL VERTEBRAL BILAT MOD SED  04/21/2019  . IR PTA INTRACRANIAL  05/14/2019  . KNEE SURGERY     LEFT KNEE  . RADIOLOGY WITH ANESTHESIA N/A 05/14/2019   Procedure: IR WITH ANESTHESIA STENTING;  Surgeon: Luanne Bras, MD;  Location: [facility];  Service: Radiology;  Laterality: N/A;  . TONSILLECTOMY      Family History  Problem Relation Age of  Onset  . Hypertension Mother   . Heart attack Mother 36  . Colon cancer Neg Hx   . Esophageal cancer Neg Hx   . Pancreatic cancer Neg Hx   . Stomach cancer Neg Hx   . Liver disease Neg Hx    Social History   Tobacco Use  . Smoking status: Never Smoker  . Smokeless tobacco: Former Systems developer    Types: Chew  . Tobacco comment: Rare use, about q 6 months  Vaping Use  . Vaping Use: Never used  Substance Use Topics  . Alcohol use: Yes    Comment:  rare  . Drug use: No   Home Medications Prior to Admission medications   Medication Sig Start Date End Date Taking? Authorizing Provider  amLODipine (NORVASC) 10 MG tablet Take 1 tablet (10 mg total) by mouth daily. 04/23/19  Yes Josue Hector, MD  Ascorbic Acid (VITAMIN C) 1000 MG tablet Take 1,000 mg by mouth daily.   Yes [provider]  aspirin EC 81 MG tablet Take 1 tablet (81 mg total) by mouth daily. 10/11/15  Yes Karamalegos, Devonne Doughty, DO  clopidogrel (PLAVIX) 75 MG tablet Take 1 tablet (75 mg total) by mouth daily. 04/07/19  Yes Josue Hector, MD  finasteride (PROSCAR) 5 MG tablet Take 5 mg by mouth daily.   Yes [provider]  Multiple Vitamins-Minerals (VITAMIN D3 COMPLETE PO) Take 1 tablet by mouth daily.    Yes [provider]  pantoprazole (PROTONIX) 20 MG tablet Take 1 tablet by mouth twice daily Patient taking differently: Take 20 mg by mouth 2 (two) times daily.  10/27/19  Yes Armbruster, Carlota Raspberry, MD  rosuvastatin (CRESTOR) 20 MG tablet Take 1 tablet (20 mg total) by mouth daily. 10/27/19  Yes Josue Hector, MD  TRIUMEQ 600-50-300 MG tablet Take 1 tablet by mouth once daily Patient taking differently: Take 1 tablet by mouth daily.  09/15/19  Yes [name], MD  Zinc 50 MG TABS Take 50 mg by mouth daily.    Yes [provider]  doxycycline (VIBRAMYCIN) 50 MG capsule Take 1 capsule (50 mg total) by mouth 2 (two) times daily. 12/17/19   Paulla Dolly, MD   Allergies    Baclofen, Efavirenz, and Nevirapine  Review of Systems   Review of Systems  Constitutional: Negative for chills and fever.  HENT: Negative for ear pain and sore throat.   Eyes: Positive for visual disturbance. Negative for photophobia, pain, discharge and redness.  Respiratory: Negative for cough and shortness of breath.   Cardiovascular: Positive for leg swelling. Negative for chest pain and palpitations.  Gastrointestinal: Negative for abdominal pain,  diarrhea, nausea and vomiting.  Genitourinary: Negative for dysuria and hematuria.  Musculoskeletal: Negative for arthralgias and back pain.  Skin: Positive for rash. Negative for color change and wound.  Neurological: Positive for headaches. Negative for seizures and syncope.  All other systems reviewed and are negative.  Physical Exam Updated Vital Signs BP 131/85   Pulse 78   Temp (!) 97.5 F (36.4 C) (Oral)   Resp 17   Ht 5' 10"  (1.778 m)   Wt 68.9 kg   SpO2 98%   BMI 21.81 kg/m   Physical Exam Vitals and nursing note reviewed.  Constitutional:      General: He is not in acute distress.    Appearance: He is well-developed and normal weight. He is not ill-appearing or toxic-appearing.  HENT:     Head: Normocephalic and  atraumatic.     Comments: Bilateral temporal regions nontender to palpation    Mouth/Throat:     Mouth: Mucous membranes are moist.     Pharynx: Oropharynx is clear.  Eyes:     General: No visual field deficit or scleral icterus.    Extraocular Movements: Extraocular movements intact.     Conjunctiva/sclera: Conjunctivae normal.     Pupils: Pupils are equal, round, and reactive to light.  Cardiovascular:     Rate and Rhythm: Normal rate and regular rhythm.     Heart sounds: Normal heart sounds. No murmur heard.   Pulmonary:     Effort: Pulmonary effort is normal. No respiratory distress.     Breath sounds: Normal breath sounds.  Abdominal:     General: Bowel sounds are normal.     Palpations: Abdomen is soft.     Tenderness: There is no abdominal tenderness.  Musculoskeletal:        General: Swelling and tenderness present.     Cervical back: Normal range of motion and neck supple.     Comments: Nonpitting edema of right lower extremity. Tender to palpation.  Skin:    General: Skin is warm and dry.     Capillary Refill: Capillary refill takes less than 2 seconds.     Findings: Erythema present.     Comments: Erythema extending from ankle to mid  anterior shin. Separate 3cm round area of erythema of lateral aspect of lower extremity.   Neurological:     Mental Status: He is alert and oriented to person, place, and time. Mental status is at baseline.     Cranial Nerves: No cranial nerve deficit or facial asymmetry.     Sensory: No sensory deficit.     Motor: No weakness.  Psychiatric:        Mood and Affect: Mood normal.        Speech: Speech normal.        Behavior: Behavior normal.    ED Results / Procedures / Treatments   Labs (all labs ordered are listed, but only abnormal results are displayed) Labs Reviewed  CBC WITH DIFFERENTIAL/PLATELET - Abnormal; Notable for the following components:      Result Value   RBC 3.88 (*)    Hemoglobin 12.8 (*)    HCT 38.8 (*)    All other components within normal limits  COMPREHENSIVE METABOLIC PANEL - Abnormal; Notable for the following components:   Glucose, Bld 104 (*)    Creatinine, Ser 1.30 (*)    GFR calc non Af Amer 55 (*)    All other components within normal limits  SEDIMENTATION RATE - Abnormal; Notable for the following components:   Sed Rate 30 (*)    All other components within normal limits   EKG EKG Interpretation  Date/Time:  Tuesday December 16 2019 18:03:48 EDT Ventricular Rate:  97 PR Interval:  140 QRS Duration: 92 QT Interval:  312 QTC Calculation: 396 R Axis:   87 Text Interpretation: Normal sinus rhythm T wave abnormality, consider inferior ischemia Abnormal ECG When compared with ECG of 05/30/2019, No significant change was found Confirmed by Delora Fuel ([id number]) on 12/16/2019 11:45:15 PM  Radiology CT Head Wo Contrast  Result Date: 12/16/2019 CLINICAL DATA:  Headache EXAM: CT HEAD WITHOUT CONTRAST TECHNIQUE: Contiguous axial images were obtained from the base of the skull through the vertex without intravenous contrast. COMPARISON:  MRI 10/30/2019 FINDINGS: Brain: No acute intracranial abnormality. Specifically, no hemorrhage, hydrocephalus, mass lesion,  acute  infarction, or significant intracranial injury. Vascular: No hyperdense vessel or unexpected calcification. Skull: No acute calvarial abnormality. Sinuses/Orbits: Visualized paranasal sinuses and mastoids clear. Orbital soft tissues unremarkable. Other: None IMPRESSION: No acute intracranial abnormality. Electronically Signed   By: Rolm Baptise M.D.   On: 12/16/2019 20:22   VAS [place] LOWER EXTREMITY VENOUS (DVT) (ONLY [facility] & [facility] 7a-7p)  Result Date: 12/17/2019  Lower Venous DVTStudy Indications: Swelling.  Comparison Study: No prior studies. Performing Technologist: Darlin Coco  Examination Guidelines: A complete evaluation includes B-mode imaging, spectral Doppler, color Doppler, and power Doppler as needed of all accessible portions of each vessel. Bilateral testing is considered an integral part of a complete examination. Limited examinations for reoccurring indications may be performed as noted. The reflux portion of the exam is performed with the patient in reverse Trendelenburg.  +--------+---------------+---------+-----------+----------+--------------------+ RIGHT   CompressibilityPhasicitySpontaneityPropertiesThrombus Aging       +--------+---------------+---------+-----------+----------+--------------------+ CFV     Full           Yes      Yes                                       +--------+---------------+---------+-----------+----------+--------------------+ SFJ     Full                                                              +--------+---------------+---------+-----------+----------+--------------------+ FV Prox Full                                                              +--------+---------------+---------+-----------+----------+--------------------+ FV Mid  Full                                                              +--------+---------------+---------+-----------+----------+--------------------+ FV      Full                                                               Distal                                                                    +--------+---------------+---------+-----------+----------+--------------------+ PFV     Full                                                              +--------+---------------+---------+-----------+----------+--------------------+  POP     Full           Yes      Yes                  Duplicated-both                                                           patent               +--------+---------------+---------+-----------+----------+--------------------+ PTV     Full                                                              +--------+---------------+---------+-----------+----------+--------------------+ PERO    Full                                                              +--------+---------------+---------+-----------+----------+--------------------+   +----+---------------+---------+-----------+----------+--------------+ LEFTCompressibilityPhasicitySpontaneityPropertiesThrombus Aging +----+---------------+---------+-----------+----------+--------------+ CFV Full           Yes      Yes                                 +----+---------------+---------+-----------+----------+--------------+     Summary: RIGHT: - There is no evidence of deep vein thrombosis in the lower extremity.  - No cystic structure found in the popliteal fossa.  LEFT: - No evidence of common femoral vein obstruction.  *See table(s) above for measurements and observations. Electronically signed by Curt Jews MD on 12/17/2019 at 1:41:11 PM.    Final    Procedures Procedures (including critical care time)  Medications Ordered in ED Medications - No data to display  ED Course  I have reviewed the triage vital signs and the nursing notes.  Pertinent labs & imaging results that were available during my care of the patient were reviewed by me and considered in my medical  decision making (see chart for details).    MDM Rules/Calculators/A&P                         Jeffrey [name] is a 74 year old man with past medical history significant for HTN, prior CVA x2, HLD and HIV who presents to the [facility] for evaluation of headache, blurry vision and swelling in his leg.   On arrival to ED, patient was noted to be hemodynamically stable. Physical exam revealed significant unilateral nonpitting edema of right lower extremity with overlying erythema and tenderness. Initial labs (CBC and CMP), as well as CTH wo contrast were unremarkable. Patient's chronic, daily unilateral right-sided headaches in the setting of significant analgesic use is likely secondary to analgesic rebound versus chronic migraine versus nerve entrapment given radiation down to his neck. His unilateral left-sided blurry vision is reportedly not associated with his headaches and has since resolved. His EOM and visual fields are intact with no evidence  of eye redness or discharge on examination. Alternative etiologies for these symptoms were considered including an underlying intracranial mass, intracranial bleed, or stroke, however initial imaging (CTH wo) is unremarkable. ESR mildly elevated to 30 which raises suspicion for temporal arteritis, however he denies association of headache with his contralateral visual symptoms, jaw claudication, and he does not have temporal tenderness on physical examination. He denies headache and vision symptoms upon our evaluation, as these symptoms reportedly resolved overnight while in the waiting room. He does not currently follow with neurology or optometry and would benefit from doing so for further evaluation and management of these complaints. We will place an ambulatory referral to neurology and hold off giving analgesics for headache while in ED. His unilateral leg swelling, erythema and tenderness is likely nonpurulent cellulitis. Elevations in ESR likely secondary to  underlying cellulitis. DVT was initially considered, however ultrasound of right-lower extremity was unremarkable.  Overall, we will treat his cellulitis with a course of oral antibiotics, place an ambulatory referral to neurology for follow-up of his chronic headaches and visual disturbances, recommend follow-up with optometry for further evaluation of vision, and recommend lessening the frequency of his analgesic use. He understands and agrees with this plan. He has no further questions or concerns.  Final Clinical Impression(s) / ED Diagnoses Final diagnoses:  Chronic intractable headache, unspecified headache type  Blurry vision, left eye  Cellulitis of right lower leg   Rx / DC Orders ED Discharge Orders         Ordered    Ambulatory referral to Neurology     Discontinue    Comments: An appointment is requested in approximately one week for evaluation of chronic headache.   12/17/19 1405    doxycycline (VIBRAMYCIN) 50 MG capsule  2 times daily     Discontinue     12/17/19 1405           Paulla Dolly, MD 12/17/19 1406    Little, Wenda Overland, MD 12/22/19 1004

## 2019-12-17 NOTE — Discharge Instructions (Addendum)
-  Please complete course of antibiotic (doxycycline) for cellulitis of your right lower leg.   -We have placed a referral for [facility]. The neurology office should contact you to set up an appointment. If you have not heard from them in one week, please call to schedule appointment.  -Set up an appointment with an optometrist (eye doctor) since you have not had an appointment in some time.  -Decrease the amount of medications (tylenol, NSAIDs) that you have been taking over-the-counter for headaches

## 2019-12-17 NOTE — Progress Notes (Signed)
Lower extremity venous RT study completed  Preliminary results relayed to [place], MD.  See CV Proc for preliminary results report.   Darlin Coco

## 2019-12-23 ENCOUNTER — Encounter: Payer: Self-pay | Admitting: Neurology

## 2019-12-23 ENCOUNTER — Ambulatory Visit (INDEPENDENT_AMBULATORY_CARE_PROVIDER_SITE_OTHER): Payer: Medicare HMO | Admitting: Neurology

## 2019-12-23 VITALS — BP 137/83 | HR 67 | Ht 70.0 in | Wt 163.5 lb

## 2019-12-23 DIAGNOSIS — M542 Cervicalgia: Secondary | ICD-10-CM | POA: Diagnosis not present

## 2019-12-23 DIAGNOSIS — M5481 Occipital neuralgia: Secondary | ICD-10-CM

## 2019-12-23 DIAGNOSIS — G4489 Other headache syndrome: Secondary | ICD-10-CM | POA: Diagnosis not present

## 2019-12-23 DIAGNOSIS — I6521 Occlusion and stenosis of right carotid artery: Secondary | ICD-10-CM | POA: Diagnosis not present

## 2019-12-23 NOTE — Progress Notes (Signed)
[facility]  PATIENT: Jeffrey [name] DOB: 1945/07/08  REFERRING DOCTOR OR PCP: Theotis Burrow, MD; Mina Marble (PCP) SOURCE: Patient, notes from primary care  _________________________________   HISTORICAL  CHIEF COMPLAINT:  Chief Complaint  Patient presents with  . New Patient (Initial Visit)    Rm 13, alone. Internal referral from Theotis Burrow, MD for headaches. Had covid 05/2019.   Marland Kitchen Headache    Has intermittent headaches located at top of head and base of skull. Losing mobility in right hand and leg. Getting about 4 per week. Has intermittent blurry vision in left eye. Plannig on going back to see the eye doctor.     HISTORY OF PRESENT ILLNESS:  I had the pleasure seeing your patient, Jeffrey [name], at [facility] for neurologic consultation regarding his headaches.  He is a 74 year old man who has had headaches for the last 4 weeks that intensified about 9 days ago.    He went to Urgent care for the headaches and right leg swelling.   He was diagnosed with cellulitis and referred to [place] for further evaluation of the headache.  His headache is located at the right vertex and right occiput.    He was taking Excedrin Headache but was told to stop.   He felt it helped an hour or two only.  Activity makes the headache a little bit worse.   No nausea or vomiting.    His left vision seems a little more blurry.     He had balloon angioplasty supraclinoid ICA 05/14/2019.  An MRA 10/30/2019 showed 60% paraclinoid segment stenosis of the right ICA and moderate stenosis of the M2 segment of the right MCA and left MCA, and moderate stenosis of the left PCA.     He takes Plavix x 1 year   CT scan 12/16/19 showed mild SVID and expanded Tawny Hopping Robbin space next to the basal ganglia on the left   He had Covid-19 in January 2021.    His cellulitis is much better after taking a week of doxycycline   REVIEW OF SYSTEMS: Constitutional: No fevers, chills,  sweats, or change in appetite Eyes: No visual changes, double vision, eye pain Ear, nose and throat: No hearing loss, ear pain, nasal congestion, sore throat Cardiovascular: No chest pain, palpitations Respiratory: No shortness of breath at rest or with exertion.   No wheezes GastrointestinaI: No nausea, vomiting, diarrhea, abdominal pain, fecal incontinence Genitourinary: No dysuria, urinary retention or frequency.  No nocturia. Musculoskeletal: No neck pain, back pain Integumentary: No rash, pruritus, skin lesions Neurological: as above Psychiatric: No depression at this time.  No anxiety Endocrine: No palpitations, diaphoresis, change in appetite, change in weigh or increased thirst Hematologic/Lymphatic: No anemia, purpura, petechiae. Allergic/Immunologic: No itchy/runny eyes, nasal congestion, recent allergic reactions, rashes  ALLERGIES: Allergies  Allergen Reactions  . Baclofen Other (See Comments)    Back pain. Patient said it made him feel crazy   . Efavirenz Rash  . Nevirapine Rash and Other (See Comments)    HOME MEDICATIONS:  Current Outpatient Medications:  .  amLODipine (NORVASC) 10 MG tablet, Take 1 tablet (10 mg total) by mouth daily., Disp: 90 tablet, Rfl: 3 .  Ascorbic Acid (VITAMIN C) 1000 MG tablet, Take 1,000 mg by mouth daily., Disp: , Rfl:  .  aspirin EC 81 MG tablet, Take 1 tablet (81 mg total) by mouth daily., Disp: , Rfl:  .  clopidogrel (PLAVIX) 75 MG tablet, Take 1 tablet (75 mg total)  by mouth daily., Disp: 90 tablet, Rfl: 3 .  doxycycline (VIBRAMYCIN) 50 MG capsule, Take 1 capsule (50 mg total) by mouth 2 (two) times daily., Disp: 14 capsule, Rfl: 0 .  finasteride (PROSCAR) 5 MG tablet, Take 5 mg by mouth daily., Disp: , Rfl:  .  pantoprazole (PROTONIX) 20 MG tablet, Take 1 tablet by mouth twice daily (Patient taking differently: Take 20 mg by mouth 2 (two) times daily. ), Disp: 60 tablet, Rfl: 2 .  rosuvastatin (CRESTOR) 20 MG tablet, Take 1  tablet (20 mg total) by mouth daily., Disp: 90 tablet, Rfl: 3 .  TRIUMEQ 600-50-300 MG tablet, Take 1 tablet by mouth once daily (Patient taking differently: Take 1 tablet by mouth daily. ), Disp: 30 tablet, Rfl: 5 .  VITAMIN D PO, Take 1 tablet by mouth daily., Disp: , Rfl:  .  Zinc 50 MG TABS, Take 50 mg by mouth daily. , Disp: , Rfl:   PAST MEDICAL HISTORY: Past Medical History:  Diagnosis Date  . Chronic kidney disease   . Colon polyps   . GERD (gastroesophageal reflux disease)   . Heart murmur   . History of kidney stones   . HIV (human immunodeficiency virus infection) ([place]) 1999  . Hypertension   . Pneumonia   . Stroke [facility])    right side weakness  . TIA (transient ischemic attack)     PAST SURGICAL HISTORY: Past Surgical History:  Procedure Laterality Date  . CHOLECYSTECTOMY  1982  . COLONOSCOPY    . ESOPHAGEAL MANOMETRY N/A 09/03/2017   Procedure: ESOPHAGEAL MANOMETRY (EM);  Surgeon: Mauri Pole, MD;  Location: [facility];  Service: Endoscopy;  Laterality: N/A;  . IR ANGIO INTRA EXTRACRAN SEL COM CAROTID INNOMINATE BILAT MOD SED  04/21/2019  . IR ANGIO VERTEBRAL SEL VERTEBRAL BILAT MOD SED  04/21/2019  . IR PTA INTRACRANIAL  05/14/2019  . KNEE SURGERY     LEFT KNEE  . RADIOLOGY WITH ANESTHESIA N/A 05/14/2019   Procedure: IR WITH ANESTHESIA STENTING;  Surgeon: Luanne Bras, MD;  Location: [place];  Service: Radiology;  Laterality: N/A;  . TONSILLECTOMY      FAMILY HISTORY: Family History  Problem Relation Age of Onset  . Hypertension Mother   . Heart attack Mother 69  . Colon cancer Neg Hx   . Esophageal cancer Neg Hx   . Pancreatic cancer Neg Hx   . Stomach cancer Neg Hx   . Liver disease Neg Hx     SOCIAL HISTORY:  Social History   Socioeconomic History  . Marital status: Divorced    Spouse name: Not on file  . Number of children: Not on file  . Years of education: Not on file  . Highest education level: Not on file  Occupational History    . Occupation: retired    Fish farm manager: [employer]  Tobacco Use  . Smoking status: Never Smoker  . Smokeless tobacco: Former Systems developer    Types: Chew  . Tobacco comment: Rare use, about q 6 months  Vaping Use  . Vaping Use: Never used  Substance and Sexual Activity  . Alcohol use: Yes    Comment: rare  . Drug use: No  . Sexual activity: Yes    Partners: Female    Comment: declined condoms  Other Topics Concern  . Not on file  Social History Narrative   Right handed    Caffeine use: tea, mainly water   Social Determinants of Health   Financial Resource Strain:   .  Difficulty of Paying Living Expenses:   Food Insecurity:   . Worried About Charity fundraiser in the Last Year:   . Arboriculturist in the Last Year:   Transportation Needs:   . Film/video editor (Medical):   Marland Kitchen Lack of Transportation (Non-Medical):   Physical Activity:   . Days of Exercise per Week:   . Minutes of Exercise per Session:   Stress:   . Feeling of Stress :   Social Connections:   . Frequency of Communication with Friends and Family:   . Frequency of Social Gatherings with Friends and Family:   . Attends Religious Services:   . Active Member of Clubs or Organizations:   . Attends Archivist Meetings:   Marland Kitchen Marital Status:   Intimate Partner Violence:   . Fear of Current or Ex-Partner:   . Emotionally Abused:   Marland Kitchen Physically Abused:   . Sexually Abused:      PHYSICAL EXAM  Vitals:   12/23/19 1431  BP: 137/83  Pulse: 67  Weight: 163 lb 8 oz (74.2 kg)  Height: 5' 10"  (1.778 m)    Body mass index is 23.46 kg/m.   General: The patient is well-developed and well-nourished and in no acute distress  HEENT:  Head is /AT.  Sclera are anicteric.   Neck: No carotid bruits are noted.  The neck is tender at the right occiput..  Cardiovascular: The heart has a regular rate and rhythm with a normal S1 and S2. There were no murmurs, gallops or rubs.    Skin: Slight edema in the right  leg relative to the left.  He reports this is much improved compared to last week.  No erythema..  Musculoskeletal:  Back is nontender  Neurologic Exam  Mental status: The patient is alert and oriented x 3 at the time of the examination. The patient has apparent normal recent and remote memory, with an apparently normal attention span and concentration ability.   Speech is normal.  Cranial nerves: Extraocular movements are full.   Facial symmetry is present. There is good facial sensation to soft touch bilaterally.Facial strength is normal.  Trapezius and sternocleidomastoid strength is normal. No dysarthria is noted.  No obvious hearing deficits are noted.  Motor:  Muscle bulk is normal.   Tone is normal. Strength is  5 / 5 in all 4 extremities.   Sensory: Sensory testing is intact to pinprick, soft touch and vibration sensation in all 4 extremities.  Coordination: Cerebellar testing reveals good finger-nose-finger and heel-to-shin bilaterally.  Gait and station: Station is normal.   Gait is normal. Tandem gait is mildly wide. Romberg is negative.   Reflexes: Deep tendon reflexes are symmetric and normal bilaterally.   Plantar responses are flexor.    DIAGNOSTIC DATA (LABS, IMAGING, TESTING) - I reviewed patient records, labs, notes, testing and imaging myself where available.  Lab Results  Component Value Date   WBC 10.1 12/16/2019   HGB 12.8 (L) 12/16/2019   HCT 38.8 (L) 12/16/2019   MCV 100.0 12/16/2019   PLT 204 12/16/2019      Component Value Date/Time   NA 138 12/16/2019 1826   NA 141 02/27/2019 1638   K 4.0 12/16/2019 1826   CL 104 12/16/2019 1826   CO2 24 12/16/2019 1826   GLUCOSE 104 (H) 12/16/2019 1826   BUN 23 12/16/2019 1826   BUN 27 02/27/2019 1638   CREATININE 1.30 (H) 12/16/2019 1826   CREATININE 1.53 (H)  02/10/2019 0911   CALCIUM 9.6 12/16/2019 1826   PROT 7.2 12/16/2019 1826   PROT 6.6 10/27/2019 0811   ALBUMIN 4.0 12/16/2019 1826   ALBUMIN 4.1  10/27/2019 0811   AST 18 12/16/2019 1826   ALT 17 12/16/2019 1826   ALKPHOS 76 12/16/2019 1826   BILITOT 0.8 12/16/2019 1826   BILITOT 0.3 10/27/2019 0811   GFRNONAA 55 (L) 12/16/2019 1826   GFRNONAA 39 (L) 10/16/2014 1421   GFRAA >60 12/16/2019 1826   GFRAA 45 (L) 10/16/2014 1421   Lab Results  Component Value Date   CHOL 147 10/27/2019   HDL 43 10/27/2019   LDLCALC 82 10/27/2019   TRIG 121 10/27/2019   CHOLHDL 3.4 10/27/2019   Lab Results  Component Value Date   HGBA1C 5.2 09/13/2015   No results found for: VITAMINB12 Lab Results  Component Value Date   TSH 2.399 04/12/2010       ASSESSMENT AND PLAN  Occipital neuralgia of right side  Other headache syndrome  Neck pain  Internal carotid artery stenosis, right  In summary, Mr. Londo is a 74 year old man with headache off-and-on over the last month that intensified 8 days ago.  The characteristics of the headache are most consistent with right occipital neuralgia.  1.   Right splenius capitus trigger point injection with 40 mg Depo-Medrol in [place] using sterile technique.     2.    If pain returns, consider nortriptyline 25 mg 3..   RTC as needed.     Thank you for asking me to see Mr. Nylund.  Please let me know if I can be of further assistance with him or the patients in the future.  Chantale Leugers A. Felecia Shelling, MD, [facility] 9/79/1504, 1:36 PM Certified in Neurology, Clinical Neurophysiology, Sleep Medicine and Neuroimaging  [facility] [address] [place], [place] [address] [phone]

## 2020-01-07 DIAGNOSIS — H2513 Age-related nuclear cataract, bilateral: Secondary | ICD-10-CM | POA: Diagnosis not present

## 2020-01-07 DIAGNOSIS — H40033 Anatomical narrow angle, bilateral: Secondary | ICD-10-CM | POA: Diagnosis not present

## 2020-01-08 DIAGNOSIS — H5203 Hypermetropia, bilateral: Secondary | ICD-10-CM | POA: Diagnosis not present

## 2020-01-26 ENCOUNTER — Other Ambulatory Visit: Payer: Medicare HMO | Admitting: *Deleted

## 2020-01-26 ENCOUNTER — Other Ambulatory Visit: Payer: Self-pay

## 2020-01-26 DIAGNOSIS — E785 Hyperlipidemia, unspecified: Secondary | ICD-10-CM | POA: Diagnosis not present

## 2020-01-26 LAB — HEPATIC FUNCTION PANEL
ALT: 17 IU/L (ref 0–44)
AST: 22 IU/L (ref 0–40)
Albumin: 4.3 g/dL (ref 3.7–4.7)
Alkaline Phosphatase: 84 IU/L (ref 44–121)
Bilirubin Total: 0.4 mg/dL (ref 0.0–1.2)
Bilirubin, Direct: 0.14 mg/dL (ref 0.00–0.40)
Total Protein: 6.4 g/dL (ref 6.0–8.5)

## 2020-01-26 LAB — LIPID PANEL
Chol/HDL Ratio: 2.6 ratio (ref 0.0–5.0)
Cholesterol, Total: 139 mg/dL (ref 100–199)
HDL: 54 mg/dL (ref >39)
LDL Chol Calc (NIH): 65 mg/dL (ref 0–99)
Triglycerides: 112 mg/dL (ref 0–149)
VLDL Cholesterol Cal: 20 mg/dL (ref 5–40)

## 2020-02-01 ENCOUNTER — Other Ambulatory Visit: Payer: Self-pay | Admitting: Gastroenterology

## 2020-02-10 ENCOUNTER — Other Ambulatory Visit: Payer: Medicare HMO

## 2020-02-10 ENCOUNTER — Other Ambulatory Visit: Payer: Self-pay

## 2020-02-10 DIAGNOSIS — B20 Human immunodeficiency virus [HIV] disease: Secondary | ICD-10-CM | POA: Diagnosis not present

## 2020-02-10 DIAGNOSIS — Z21 Asymptomatic human immunodeficiency virus [HIV] infection status: Secondary | ICD-10-CM | POA: Diagnosis not present

## 2020-02-10 DIAGNOSIS — Z79899 Other long term (current) drug therapy: Secondary | ICD-10-CM | POA: Diagnosis not present

## 2020-02-11 LAB — T-HELPER CELL (CD4) - (RCID CLINIC ONLY)
CD4 % Helper T Cell: 25 % — ABNORMAL LOW (ref 33–65)
CD4 T Cell Abs: 469 /uL (ref 400–1790)

## 2020-02-12 LAB — RPR: RPR Ser Ql: NONREACTIVE

## 2020-02-12 LAB — CBC
HCT: 36.4 % — ABNORMAL LOW (ref 38.5–50.0)
Hemoglobin: 12.3 g/dL — ABNORMAL LOW (ref 13.2–17.1)
MCH: 34.9 pg — ABNORMAL HIGH (ref 27.0–33.0)
MCHC: 33.8 g/dL (ref 32.0–36.0)
MCV: 103.4 fL — ABNORMAL HIGH (ref 80.0–100.0)
MPV: 8.9 fL (ref 7.5–12.5)
Platelets: 167 10*3/uL (ref 140–400)
RBC: 3.52 10*6/uL — ABNORMAL LOW (ref 4.20–5.80)
RDW: 12.7 % (ref 11.0–15.0)
WBC: 4.5 10*3/uL (ref 3.8–10.8)

## 2020-02-12 LAB — COMPREHENSIVE METABOLIC PANEL
AG Ratio: 1.9 (calc) (ref 1.0–2.5)
ALT: 12 U/L (ref 9–46)
AST: 15 U/L (ref 10–35)
Albumin: 3.9 g/dL (ref 3.6–5.1)
Alkaline phosphatase (APISO): 71 U/L (ref 35–144)
BUN/Creatinine Ratio: 14 (calc) (ref 6–22)
BUN: 20 mg/dL (ref 7–25)
CO2: 30 mmol/L (ref 20–32)
Calcium: 9.1 mg/dL (ref 8.6–10.3)
Chloride: 107 mmol/L (ref 98–110)
Creat: 1.48 mg/dL — ABNORMAL HIGH (ref 0.70–1.18)
Globulin: 2.1 g/dL (calc) (ref 1.9–3.7)
Glucose, Bld: 115 mg/dL — ABNORMAL HIGH (ref 65–99)
Potassium: 4.2 mmol/L (ref 3.5–5.3)
Sodium: 141 mmol/L (ref 135–146)
Total Bilirubin: 0.3 mg/dL (ref 0.2–1.2)
Total Protein: 6 g/dL — ABNORMAL LOW (ref 6.1–8.1)

## 2020-02-12 LAB — LIPID PANEL
Cholesterol: 119 mg/dL (ref <200)
HDL: 45 mg/dL (ref >=40)
LDL Cholesterol (Calc): 53 mg/dL (calc)
Non-HDL Cholesterol (Calc): 74 mg/dL (calc) (ref <130)
Total CHOL/HDL Ratio: 2.6 (calc) (ref <5.0)
Triglycerides: 128 mg/dL (ref <150)

## 2020-02-12 LAB — HIV-1 RNA QUANT-NO REFLEX-BLD
HIV 1 RNA Quant: <20 Copies/mL
HIV-1 RNA Quant, Log: <1.3 Log cps/mL

## 2020-02-20 DIAGNOSIS — R351 Nocturia: Secondary | ICD-10-CM | POA: Diagnosis not present

## 2020-02-20 DIAGNOSIS — N5201 Erectile dysfunction due to arterial insufficiency: Secondary | ICD-10-CM | POA: Diagnosis not present

## 2020-02-20 DIAGNOSIS — N401 Enlarged prostate with lower urinary tract symptoms: Secondary | ICD-10-CM | POA: Diagnosis not present

## 2020-02-20 DIAGNOSIS — R311 Benign essential microscopic hematuria: Secondary | ICD-10-CM | POA: Diagnosis not present

## 2020-02-24 ENCOUNTER — Encounter: Payer: Medicare HMO | Admitting: Internal Medicine

## 2020-03-03 ENCOUNTER — Other Ambulatory Visit: Payer: Self-pay

## 2020-03-03 ENCOUNTER — Ambulatory Visit (INDEPENDENT_AMBULATORY_CARE_PROVIDER_SITE_OTHER): Payer: Medicare HMO | Admitting: Internal Medicine

## 2020-03-03 ENCOUNTER — Encounter: Payer: Self-pay | Admitting: Internal Medicine

## 2020-03-03 DIAGNOSIS — B20 Human immunodeficiency virus [HIV] disease: Secondary | ICD-10-CM | POA: Diagnosis not present

## 2020-03-03 MED ORDER — TRIUMEQ 600-50-300 MG PO TABS: 1.0000 | ORAL_TABLET | Freq: Every day | ORAL | 11 refills | Status: DC

## 2020-03-03 NOTE — Progress Notes (Signed)
Patient Active Problem List   Diagnosis Date Noted  . Chest pain 02/24/2019    Priority: High  . Human immunodeficiency virus (HIV) disease ([place]) 02/27/2006    Priority: High  . Hyperlipidemia 07/22/2012    Priority: Medium  . Erectile dysfunction 07/22/2012    Priority: Medium  . GERD 05/17/2010    Priority: Medium  . Dysphagia 03/08/2009    Priority: Medium  . Essential hypertension 02/27/2006    Priority: Medium  . Neck pain 12/23/2019  . Other headache syndrome 12/23/2019  . Occipital neuralgia of right side 12/23/2019  . CAD (coronary artery disease) 10/27/2019  . Internal carotid artery stenosis, right 05/14/2019  . Peripheral vertigo 11/29/2018  . Chronic kidney disease 12/20/2017  . Diastolic CHF with preserved left ventricular function, NYHA class 2 ([place] ) 01/06/2015  . Exertional dyspnea 09/30/2014  . Insomnia 07/22/2014  . BPH with obstruction/lower urinary tract symptoms 06/17/2014  . CERVICAL LYMPHADENOPATHY 09/19/2007  . CHOLECYSTECTOMY, HX OF 05/28/2006  . PNEUMONIA, HX OF 02/27/2006  . ARTHROSCOPY, KNEE, HX OF 02/27/2006    Patient's Medications  New Prescriptions   No medications on file  Previous Medications   AMLODIPINE (NORVASC) 10 MG TABLET    Take 1 tablet (10 mg total) by mouth daily.   ASCORBIC ACID (VITAMIN C) 1000 MG TABLET    Take 1,000 mg by mouth daily.   ASPIRIN EC 81 MG TABLET    Take 1 tablet (81 mg total) by mouth daily.   CLOPIDOGREL (PLAVIX) 75 MG TABLET    Take 1 tablet (75 mg total) by mouth daily.   DOXYCYCLINE (VIBRAMYCIN) 50 MG CAPSULE    Take 1 capsule (50 mg total) by mouth 2 (two) times daily.   FINASTERIDE (PROSCAR) 5 MG TABLET    Take 5 mg by mouth daily.   PANTOPRAZOLE (PROTONIX) 20 MG TABLET    Take 1 tablet by mouth twice daily   ROSUVASTATIN (CRESTOR) 20 MG TABLET    Take 1 tablet (20 mg total) by mouth daily.   VITAMIN D PO    Take 1 tablet by mouth daily.   ZINC 50 MG TABS    Take 50 mg by mouth daily.    Modified Medications   Modified Medication Previous Medication   ABACAVIR-DOLUTEGRAVIR-LAMIVUDINE (TRIUMEQ) 600-50-300 MG TABLET TRIUMEQ 600-50-300 MG tablet      Take 1 tablet by mouth daily.    Take 1 tablet by mouth once daily  Discontinued Medications   No medications on file    Subjective: Check is in for his routine HIV follow-up visit.  He has not had any problems obtaining, taking or tolerating his Triumeq.  He was hospitalized earlier this year for some vascular surgery and missed 1 dose.  He had Covid around that same time.  He says that he has been more fatigued than normal ever since and he has had some problems with his memory that he thinks may be post Covid symptoms.  He did receive the first 2 doses of his Covid vaccines recently.  Review of Systems: Review of Systems  Constitutional: Positive for malaise/fatigue. Negative for fever.  Respiratory: Negative for cough.   Cardiovascular: Negative for chest pain.  Neurological: Positive for headaches.  Psychiatric/Behavioral: Positive for memory loss.    Past Medical History:  Diagnosis Date  . Chronic kidney disease   . Colon polyps   . GERD (gastroesophageal reflux disease)   . Heart murmur   .  History of kidney stones   . HIV (human immunodeficiency virus infection) ([place]) 1999  . Hypertension   . Pneumonia   . Stroke [facility])    right side weakness  . TIA (transient ischemic attack)     Social History   Tobacco Use  . Smoking status: Never Smoker  . Smokeless tobacco: Former Systems developer    Types: Chew  . Tobacco comment: Rare use, about q 6 months  Vaping Use  . Vaping Use: Never used  Substance Use Topics  . Alcohol use: Yes    Comment: rare  . Drug use: No    Family History  Problem Relation Age of Onset  . Hypertension Mother   . Heart attack Mother 78  . Colon cancer Neg Hx   . Esophageal cancer Neg Hx   . Pancreatic cancer Neg Hx   . Stomach cancer Neg Hx   . Liver disease Neg Hx     Allergies   Allergen Reactions  . Baclofen Other (See Comments)    Back pain. Patient said it made him feel crazy   . Efavirenz Rash  . Nevirapine Rash and Other (See Comments)    Health Maintenance  Topic Date Due  . COVID-19 Vaccine (1) Never done  . INFLUENZA VACCINE  12/07/2019  . COLONOSCOPY  08/03/2020  . TETANUS/TDAP  01/05/2025  . Hepatitis C Screening  Completed  . PNA vac Low Risk Adult  Completed    Objective:  Vitals:   03/03/20 1008  BP: 138/84  Pulse: 66  SpO2: 98%  Weight: 168 lb (76.2 kg)   Body mass index is 24.11 kg/m.  Physical Exam Constitutional:      Comments: He is in good spirits.  Cardiovascular:     Rate and Rhythm: Normal rate and regular rhythm.     Heart sounds: No murmur heard.   Pulmonary:     Effort: Pulmonary effort is normal.     Breath sounds: Normal breath sounds.  Psychiatric:        Mood and Affect: Mood normal.     Lab Results Lab Results  Component Value Date   WBC 4.5 02/10/2020   HGB 12.3 (L) 02/10/2020   HCT 36.4 (L) 02/10/2020   MCV 103.4 (H) 02/10/2020   PLT 167 02/10/2020    Lab Results  Component Value Date   CREATININE 1.48 (H) 02/10/2020   BUN 20 02/10/2020   NA 141 02/10/2020   K 4.2 02/10/2020   CL 107 02/10/2020   CO2 30 02/10/2020    Lab Results  Component Value Date   ALT 12 02/10/2020   AST 15 02/10/2020   ALKPHOS 84 01/26/2020   BILITOT 0.3 02/10/2020    Lab Results  Component Value Date   CHOL 119 02/10/2020   HDL 45 02/10/2020   LDLCALC 53 02/10/2020   TRIG 128 02/10/2020   CHOLHDL 2.6 02/10/2020   Lab Results  Component Value Date   LABRPR NON-REACTIVE 02/10/2020   HIV 1 RNA Quant  Date Value  02/10/2020 <20 Copies/mL  02/10/2019 <20 NOT DETECTED copies/mL  07/25/2018 <20 NOT DETECTED copies/mL   CD4 T Cell Abs (/uL)  Date Value  02/10/2020 469  02/10/2019 567  07/25/2018 290 (L)     Problem List Items Addressed This Visit      High   Human immunodeficiency virus (HIV)  disease ([place])    His infection remains under excellent, long-term control.  He will continue Triumeq and follow-up after lab work in  1 year.  He plans on getting the high-dose influenza vaccine at his pharmacy this afternoon.      Relevant Medications   abacavir-dolutegravir-lamiVUDine (TRIUMEQ) 732-20-254 MG tablet   Other Relevant Orders   CBC   T-helper cell (CD4)- ([facility] clinic only)   Comprehensive metabolic panel   Lipid panel   RPR   HIV-1 RNA quant-no reflex-bld        [name], MD [phone] pager   [phone] cell 03/03/2020, 10:23 AM

## 2020-03-03 NOTE — Assessment & Plan Note (Signed)
His infection remains under excellent, long-term control.  He will continue Triumeq and follow-up after lab work in 1 year.  He plans on getting the high-dose influenza vaccine at his pharmacy this afternoon.

## 2020-03-23 ENCOUNTER — Other Ambulatory Visit (HOSPITAL_COMMUNITY): Payer: Self-pay | Admitting: Interventional Radiology

## 2020-03-23 DIAGNOSIS — I771 Stricture of artery: Secondary | ICD-10-CM

## 2020-03-26 ENCOUNTER — Ambulatory Visit (HOSPITAL_COMMUNITY)
Admission: RE | Admit: 2020-03-26 | Discharge: 2020-03-26 | Disposition: A | Discharge status: Home / Self Care | Payer: Medicare HMO | Source: Ambulatory Visit | Attending: Interventional Radiology | Admitting: Interventional Radiology

## 2020-03-26 ENCOUNTER — Other Ambulatory Visit: Payer: Self-pay

## 2020-03-26 ENCOUNTER — Ambulatory Visit (HOSPITAL_COMMUNITY): Payer: Medicare HMO

## 2020-03-26 DIAGNOSIS — I771 Stricture of artery: Secondary | ICD-10-CM | POA: Insufficient documentation

## 2020-03-26 DIAGNOSIS — I651 Occlusion and stenosis of basilar artery: Secondary | ICD-10-CM | POA: Diagnosis not present

## 2020-03-26 DIAGNOSIS — I6782 Cerebral ischemia: Secondary | ICD-10-CM | POA: Insufficient documentation

## 2020-03-26 DIAGNOSIS — I6521 Occlusion and stenosis of right carotid artery: Secondary | ICD-10-CM | POA: Diagnosis not present

## 2020-03-29 ENCOUNTER — Telehealth (HOSPITAL_COMMUNITY): Payer: Self-pay | Admitting: Radiology

## 2020-03-29 ENCOUNTER — Other Ambulatory Visit (HOSPITAL_COMMUNITY): Payer: Self-pay | Admitting: Interventional Radiology

## 2020-03-29 DIAGNOSIS — I639 Cerebral infarction, unspecified: Secondary | ICD-10-CM

## 2020-03-29 NOTE — Telephone Encounter (Signed)
Called pt, left VM for pt to call me to let me know if he is asymptomatic or symptomatic to determine his next follow-up with Deveshwar. JM

## 2020-04-05 ENCOUNTER — Other Ambulatory Visit: Payer: Self-pay | Admitting: Cardiovascular Disease

## 2020-04-05 DIAGNOSIS — Z1159 Encounter for screening for other viral diseases: Secondary | ICD-10-CM | POA: Diagnosis not present

## 2020-04-13 ENCOUNTER — Ambulatory Visit (HOSPITAL_COMMUNITY): Admission: RE | Admit: 2020-04-13 | Payer: Medicare HMO | Source: Ambulatory Visit

## 2020-04-19 ENCOUNTER — Other Ambulatory Visit: Payer: Self-pay | Admitting: Cardiovascular Disease

## 2020-04-23 ENCOUNTER — Ambulatory Visit (HOSPITAL_COMMUNITY): Payer: Medicare HMO

## 2020-05-06 ENCOUNTER — Ambulatory Visit (HOSPITAL_COMMUNITY): Payer: Medicare HMO

## 2020-05-06 ENCOUNTER — Encounter (HOSPITAL_COMMUNITY): Payer: Self-pay

## 2020-05-10 ENCOUNTER — Other Ambulatory Visit (HOSPITAL_COMMUNITY): Payer: Self-pay | Admitting: Physician Assistant

## 2020-05-10 ENCOUNTER — Other Ambulatory Visit: Payer: Self-pay | Admitting: Student

## 2020-05-11 ENCOUNTER — Other Ambulatory Visit (HOSPITAL_COMMUNITY): Payer: Self-pay | Admitting: Interventional Radiology

## 2020-05-11 ENCOUNTER — Ambulatory Visit (HOSPITAL_COMMUNITY)
Admission: RE | Admit: 2020-05-11 | Discharge: 2020-05-11 | Disposition: A | Discharge status: Home / Self Care | Payer: Medicare HMO | Source: Ambulatory Visit | Attending: Interventional Radiology | Admitting: Interventional Radiology

## 2020-05-11 ENCOUNTER — Other Ambulatory Visit: Payer: Self-pay

## 2020-05-11 ENCOUNTER — Encounter (HOSPITAL_COMMUNITY): Payer: Self-pay

## 2020-05-11 DIAGNOSIS — Z7982 Long term (current) use of aspirin: Secondary | ICD-10-CM | POA: Insufficient documentation

## 2020-05-11 DIAGNOSIS — I6521 Occlusion and stenosis of right carotid artery: Secondary | ICD-10-CM | POA: Diagnosis not present

## 2020-05-11 DIAGNOSIS — Z87891 Personal history of nicotine dependence: Secondary | ICD-10-CM | POA: Diagnosis not present

## 2020-05-11 DIAGNOSIS — I63231 Cerebral infarction due to unspecified occlusion or stenosis of right carotid arteries: Secondary | ICD-10-CM | POA: Diagnosis not present

## 2020-05-11 DIAGNOSIS — Z79899 Other long term (current) drug therapy: Secondary | ICD-10-CM | POA: Diagnosis not present

## 2020-05-11 DIAGNOSIS — I639 Cerebral infarction, unspecified: Secondary | ICD-10-CM

## 2020-05-11 DIAGNOSIS — I6611 Occlusion and stenosis of right anterior cerebral artery: Secondary | ICD-10-CM | POA: Diagnosis not present

## 2020-05-11 DIAGNOSIS — R519 Headache, unspecified: Secondary | ICD-10-CM | POA: Diagnosis not present

## 2020-05-11 DIAGNOSIS — Z7902 Long term (current) use of antithrombotics/antiplatelets: Secondary | ICD-10-CM | POA: Insufficient documentation

## 2020-05-11 DIAGNOSIS — Z9889 Other specified postprocedural states: Secondary | ICD-10-CM | POA: Diagnosis not present

## 2020-05-11 HISTORY — PX: IR US GUIDE VASC ACCESS RIGHT: IMG2390

## 2020-05-11 HISTORY — PX: IR ANGIO INTRA EXTRACRAN SEL COM CAROTID INNOMINATE BILAT MOD SED: IMG5360

## 2020-05-11 HISTORY — PX: IR ANGIO VERTEBRAL SEL VERTEBRAL BILAT MOD SED: IMG5369

## 2020-05-11 LAB — BASIC METABOLIC PANEL
Anion gap: 10 (ref 5–15)
BUN: 23 mg/dL (ref 8–23)
CO2: 23 mmol/L (ref 22–32)
Calcium: 9.1 mg/dL (ref 8.9–10.3)
Chloride: 103 mmol/L (ref 98–111)
Creatinine, Ser: 1.34 mg/dL — ABNORMAL HIGH (ref 0.61–1.24)
GFR, Estimated: 56 mL/min — ABNORMAL LOW (ref >60)
Glucose, Bld: 114 mg/dL — ABNORMAL HIGH (ref 70–99)
Potassium: 3.4 mmol/L — ABNORMAL LOW (ref 3.5–5.1)
Sodium: 136 mmol/L (ref 135–145)

## 2020-05-11 LAB — CBC
HCT: 40 % (ref 39.0–52.0)
Hemoglobin: 12.8 g/dL — ABNORMAL LOW (ref 13.0–17.0)
MCH: 31.8 pg (ref 26.0–34.0)
MCHC: 32 g/dL (ref 30.0–36.0)
MCV: 99.3 fL (ref 80.0–100.0)
Platelets: 237 10*3/uL (ref 150–400)
RBC: 4.03 MIL/uL — ABNORMAL LOW (ref 4.22–5.81)
RDW: 12.7 % (ref 11.5–15.5)
WBC: 8.5 10*3/uL (ref 4.0–10.5)
nRBC: 0 % (ref 0.0–0.2)

## 2020-05-11 LAB — PROTIME-INR
INR: 1 (ref 0.8–1.2)
Prothrombin Time: 12.7 seconds (ref 11.4–15.2)

## 2020-05-11 MED ORDER — HEPARIN SODIUM (PORCINE) 1000 UNIT/ML IJ SOLN
INTRAMUSCULAR | Status: AC
  Filled 2020-05-11: qty 1

## 2020-05-11 MED ORDER — MIDAZOLAM HCL 2 MG/2ML IJ SOLN
INTRAMUSCULAR | Status: AC | PRN
  Administered 2020-05-11: 1 mg via INTRAVENOUS

## 2020-05-11 MED ORDER — HEPARIN SODIUM (PORCINE) 1000 UNIT/ML IJ SOLN
INTRAMUSCULAR | Status: AC | PRN
  Administered 2020-05-11: 2000 [IU] via INTRA_ARTERIAL

## 2020-05-11 MED ORDER — NITROGLYCERIN 1 MG/10 ML FOR IR/CATH LAB
INTRA_ARTERIAL | Status: AC
  Filled 2020-05-11: qty 10

## 2020-05-11 MED ORDER — FENTANYL CITRATE (PF) 100 MCG/2ML IJ SOLN
INTRAMUSCULAR | Status: AC
  Filled 2020-05-11: qty 2

## 2020-05-11 MED ORDER — IOHEXOL 300 MG/ML  SOLN: 150.0000 mL | Freq: Once | INTRAMUSCULAR | Status: DC | PRN

## 2020-05-11 MED ORDER — MIDAZOLAM HCL 2 MG/2ML IJ SOLN
INTRAMUSCULAR | Status: AC
  Filled 2020-05-11: qty 2

## 2020-05-11 MED ORDER — LIDOCAINE HCL 1 % IJ SOLN
INTRAMUSCULAR | Status: AC | PRN
  Administered 2020-05-11: 10 mL

## 2020-05-11 MED ORDER — SODIUM CHLORIDE 0.9 % IV SOLN: INTRAVENOUS | Status: AC

## 2020-05-11 MED ORDER — NITROGLYCERIN 1 MG/10 ML FOR IR/CATH LAB
INTRA_ARTERIAL | Status: AC | PRN
  Administered 2020-05-11: 200 ug via INTRA_ARTERIAL

## 2020-05-11 MED ORDER — VERAPAMIL HCL 2.5 MG/ML IV SOLN
INTRAVENOUS | Status: AC
  Filled 2020-05-11: qty 2

## 2020-05-11 MED ORDER — VERAPAMIL HCL 2.5 MG/ML IV SOLN: INTRA_ARTERIAL | Status: AC | PRN

## 2020-05-11 MED ORDER — SODIUM CHLORIDE 0.9 % IV SOLN: INTRAVENOUS | Status: DC

## 2020-05-11 MED ORDER — SODIUM CHLORIDE 0.9 % IV BOLUS
250.0000 mL | Freq: Once | INTRAVENOUS | Status: AC
  Administered 2020-05-11: 250 mL via INTRAVENOUS

## 2020-05-11 MED ORDER — FENTANYL CITRATE (PF) 100 MCG/2ML IJ SOLN
INTRAMUSCULAR | Status: AC | PRN
  Administered 2020-05-11: 25 ug via INTRAVENOUS

## 2020-05-11 MED ORDER — SODIUM CHLORIDE 0.9 % IV SOLN: Freq: Once | INTRAVENOUS | Status: AC

## 2020-05-11 MED ORDER — LIDOCAINE HCL 1 % IJ SOLN
INTRAMUSCULAR | Status: AC
  Filled 2020-05-11: qty 20

## 2020-05-11 NOTE — Discharge Instructions (Signed)
Drink plenty of fluid for 48 hours and keep wrist elevated at heart level for 24 hours  Radial Site Care   This sheet gives you information about how to care for yourself after your procedure. Your health care provider may also give you more specific instructions. If you have problems or questions, contact your health care provider. What can I expect after the procedure? After the procedure, it is common to have:  Bruising and tenderness at the catheter insertion area. Follow these instructions at home: Medicines  Take over-the-counter and prescription medicines only as told by your health care provider. Insertion site care 1. Follow instructions from your health care provider about how to take care of your insertion site. Make sure you: ? Wash your hands with soap and water before you change your bandage (dressing). If soap and water are not available, use hand sanitizer. ? remove your dressing as told by your health care provider. In 24 hours 2. Check your insertion site every day for signs of infection. Check for: ? Redness, swelling, or pain. ? Fluid or blood. ? Pus or a bad smell. ? Warmth. 3. Do not take baths, swim, or use a hot tub until your health care provider approves. 4. You may shower 24-48 hours after the procedure, or as directed by your health care provider. ? Remove the dressing and gently wash the site with plain soap and water. ? Pat the area dry with a clean towel. ? Do not rub the site. That could cause bleeding. 5. Do not apply powder or lotion to the site. Activity   1. For 24 hours after the procedure, or as directed by your health care provider: ? Do not flex or bend the affected arm. ? Do not push or pull heavy objects with the affected arm. ? Do not drive yourself home from the hospital or clinic. You may drive 24 hours after the procedure unless your health care provider tells you not to. ? Do not operate machinery or power tools. 2. Do not lift  anything that is heavier than 10 lb (4.5 kg), or the limit that you are told, until your health care provider says that it is safe. For 4 days 3. Ask your health care provider when it is okay to: ? Return to work or school. ? Resume usual physical activities or sports. ? Resume sexual activity. General instructions  If the catheter site starts to bleed, raise your arm and put firm pressure on the site. If the bleeding does not stop, get help right away. This is a medical emergency.  If you went home on the same day as your procedure, a responsible adult should be with you for the first 24 hours after you arrive home.  Keep all follow-up visits as told by your health care provider. This is important. Contact a health care provider if:  You have a fever.  You have redness, swelling, or yellow drainage around your insertion site. Get help right away if:  You have unusual pain at the radial site.  The catheter insertion area swells very fast.  The insertion area is bleeding, and the bleeding does not stop when you hold steady pressure on the area.  Your arm or hand becomes pale, cool, tingly, or numb. These symptoms may represent a serious problem that is an emergency. Do not wait to see if the symptoms will go away. Get medical help right away. Call your local emergency services (911 in the [place] Do not   drive yourself to the hospital. Summary  After the procedure, it is common to have bruising and tenderness at the site.  Follow instructions from your health care provider about how to take care of your radial site wound. Check the wound every day for signs of infection.  Do not lift anything that is heavier than 10 lb (4.5 kg), or the limit that you are told, until your health care provider says that it is safe. This information is not intended to replace advice given to you by your health care provider. Make sure you discuss any questions you have with your health care  provider. Document Revised: 05/30/2017 Document Reviewed: 05/30/2017 Elsevier Patient Education  2020 Elsevier Inc.    Femoral Site Care This sheet gives you information about how to care for yourself after your procedure. Your health care provider may also give you more specific instructions. If you have problems or questions, contact your health care provider. What can I expect after the procedure? After the procedure, it is common to have:  Bruising that usually fades within 1-2 weeks.  Tenderness at the site. Follow these instructions at home: Wound care  Follow instructions from your health care provider about how to take care of your insertion site. Make sure you: ? Wash your hands with soap and water before you change your bandage (dressing). If soap and water are not available, use hand sanitizer. ? Change your dressing as told by your health care provider. ? Leave stitches (sutures), skin glue, or adhesive strips in place. These skin closures may need to stay in place for 2 weeks or longer. If adhesive strip edges start to loosen and curl up, you may trim the loose edges. Do not remove adhesive strips completely unless your health care provider tells you to do that.  Do not take baths, swim, or use a hot tub until your health care provider approves.  You may shower 24-48 hours after the procedure or as told by your health care provider. ? Gently wash the site with plain soap and water. ? Pat the area dry with a clean towel. ? Do not rub the site. This may cause bleeding.  Do not apply powder or lotion to the site. Keep the site clean and dry.  Check your femoral site every day for signs of infection. Check for: ? Redness, swelling, or pain. ? Fluid or blood. ? Warmth. ? Pus or a bad smell. Activity  For the first 2-3 days after your procedure, or as long as directed: ? Avoid climbing stairs as much as possible. ? Do not squat.  Do not lift anything that is heavier  than 10 lb (4.5 kg), or the limit that you are told, until your health care provider says that it is safe.  Rest as directed. ? Avoid sitting for a long time without moving. Get up to take short walks every 1-2 hours.  Do not drive for 24 hours if you were given a medicine to help you relax (sedative). General instructions  Take over-the-counter and prescription medicines only as told by your health care provider.  Keep all follow-up visits as told by your health care provider. This is important. Contact a health care provider if you have:  A fever or chills.  You have redness, swelling, or pain around your insertion site. Get help right away if:  The catheter insertion area swells very fast.  You pass out.  You suddenly start to sweat or your skin gets clammy.    The catheter insertion area is bleeding, and the bleeding does not stop when you hold steady pressure on the area.  The area near or just beyond the catheter insertion site becomes pale, cool, tingly, or numb. These symptoms may represent a serious problem that is an emergency. Do not wait to see if the symptoms will go away. Get medical help right away. Call your local emergency services (911 in the [place] Do not drive yourself to the hospital. Summary  After the procedure, it is common to have bruising that usually fades within 1-2 weeks.  Check your femoral site every day for signs of infection.  Do not lift anything that is heavier than 10 lb (4.5 kg), or the limit that you are told, until your health care provider says that it is safe. This information is not intended to replace advice given to you by your health care provider. Make sure you discuss any questions you have with your health care provider. Document Revised: 05/07/2017 Document Reviewed: 05/07/2017 Elsevier Patient Education  2020 Elsevier Inc.  

## 2020-05-11 NOTE — Sedation Documentation (Signed)
Patient transported to short stay. Adonis Huguenin RN at the bedside. Right wrist assessed, TR Band in place. Palpable radial pulse intact +2. Good sensation noted. Right groin site assessed, no drainage noted from site. Soft upon palpation. Site is pink warm and dry. Distal pulses intact

## 2020-05-11 NOTE — Progress Notes (Signed)
Patient was given discharge instructions. He verbalized understanding. 

## 2020-05-11 NOTE — Progress Notes (Addendum)
Chief Complaint: Patient was seen in consultation today for cerebral arteriogram    Supervising Physician: Luanne Bras  Patient Status: [facility] Out[facility]  History of Present Illness: Jeffrey [name] is a 75 y.o. male   Hx HIV Known to NIR R ICA stenosis angioplasty 05/14/19  Most recent imaging: MR 03/26/20: Stenosis right supraclinoid internal carotid artery unchanged approximately 60% diameter stenosis. Hypoplastic right A1 segment unchanged. Moderate to severe focal stenosis of the inferior division of the right middle cerebral artery unchanged Left internal carotid artery widely patent. Mild stenosis superior division left MCA unchanged. Negative for cerebral aneurysm.  IMPRESSION: 1. No acute intracranial abnormality. Mild chronic microvascular ischemic change in the white matter 2. Right supraclinoid internal carotid artery stenosis unchanged from the prior study post angioplasty 3. Stable intracranial atherosclerotic stenosis left posterior cerebral artery and bilateral MCA branches.  Pt has done well after angioplasty 2021 Has noted onset Rt sided headaches since Aug 2021 Wakes pt is am-- 1-2 x/week or 1-2 x/month. Denies N/V Can have blurred right vision with headache Denies numbness or tingling Does not take meds for pain Pain goes away in few hrs.  Scheduled for follow up arteriogram per Dr Bertram Denver to take Plavix and ASA 81 mg daily  Past Medical History:  Diagnosis Date  . Chronic kidney disease   . Colon polyps   . GERD (gastroesophageal reflux disease)   . Heart murmur   . History of kidney stones   . HIV (human immunodeficiency virus infection) ([place]) 1999  . Hypertension   . Pneumonia   . Stroke [facility])    right side weakness  . TIA (transient ischemic attack)     Past Surgical History:  Procedure Laterality Date  . CHOLECYSTECTOMY  1982  . COLONOSCOPY    . ESOPHAGEAL MANOMETRY N/A 09/03/2017   Procedure: ESOPHAGEAL  MANOMETRY (EM);  Surgeon: Mauri Pole, MD;  Location: [facility] ENDOSCOPY;  Service: Endoscopy;  Laterality: N/A;  . IR ANGIO INTRA EXTRACRAN SEL COM CAROTID INNOMINATE BILAT MOD SED  04/21/2019  . IR ANGIO VERTEBRAL SEL VERTEBRAL BILAT MOD SED  04/21/2019  . IR PTA INTRACRANIAL  05/14/2019  . KNEE SURGERY     LEFT KNEE  . RADIOLOGY WITH ANESTHESIA N/A 05/14/2019   Procedure: IR WITH ANESTHESIA STENTING;  Surgeon: Luanne Bras, MD;  Location: [place];  Service: Radiology;  Laterality: N/A;  . TONSILLECTOMY      Allergies: Baclofen, Efavirenz, and Nevirapine  Medications: Prior to Admission medications   Medication Sig Start Date End Date Taking? Authorizing Provider  abacavir-dolutegravir-lamiVUDine (TRIUMEQ) 600-50-300 MG tablet Take 1 tablet by mouth daily. 03/03/20  Yes [name], MD  amLODipine (NORVASC) 10 MG tablet Take 1 tablet (10 mg total) by mouth daily. Please keep upcoming appt in March 2022 with Dr. Johnsie Cancel for future refills. Thank you 04/20/20  Yes Josue Hector, MD  Ascorbic Acid (VITAMIN C) 1000 MG tablet Take 1,000 mg by mouth daily at 6 PM.   Yes [provider]  aspirin EC 81 MG tablet Take 1 tablet (81 mg total) by mouth daily. 10/11/15  Yes Karamalegos, Devonne Doughty, DO  Cholecalciferol (VITAMIN D3) 125 MCG (5000 UT) TABS Take 5,000 Units by mouth daily at 6 PM.   Yes [provider]  clopidogrel (PLAVIX) 75 MG tablet Take 1 tablet by mouth once daily Patient taking differently: Take 75 mg by mouth daily. 04/05/20  Yes Josue Hector, MD  finasteride (PROSCAR) 5 MG tablet  Take 5 mg by mouth daily.   Yes [provider]  pantoprazole (PROTONIX) 20 MG tablet Take 1 tablet by mouth twice daily Patient taking differently: Take 20 mg by mouth 2 (two) times daily. 02/02/20  Yes Armbruster, Carlota Raspberry, MD  rosuvastatin (CRESTOR) 20 MG tablet Take 1 tablet (20 mg total) by mouth daily. Patient taking differently: Take 20 mg by mouth daily at 6  PM. 10/27/19  Yes Josue Hector, MD  Zinc 50 MG TABS Take 50 mg by mouth daily at 6 PM.   Yes [provider]     Family History  Problem Relation Age of Onset  . Hypertension Mother   . Heart attack Mother 61  . Colon cancer Neg Hx   . Esophageal cancer Neg Hx   . Pancreatic cancer Neg Hx   . Stomach cancer Neg Hx   . Liver disease Neg Hx     Social History   Socioeconomic History  . Marital status: Divorced    Spouse name: Not on file  . Number of children: Not on file  . Years of education: Not on file  . Highest education level: Not on file  Occupational History  . Occupation: retired    Fish farm manager: [employer]  Tobacco Use  . Smoking status: Never Smoker  . Smokeless tobacco: Former Systems developer    Types: Chew  . Tobacco comment: Rare use, about q 6 months  Vaping Use  . Vaping Use: Never used  Substance and Sexual Activity  . Alcohol use: Yes    Comment: rare  . Drug use: No  . Sexual activity: Yes    Partners: Female    Comment: declined condoms  Other Topics Concern  . Not on file  Social History Narrative   Right handed    Caffeine use: tea, mainly water   Social Determinants of Health   Financial Resource Strain: Not on file  Food Insecurity: Not on file  Transportation Needs: Not on file  Physical Activity: Not on file  Stress: Not on file  Social Connections: Not on file     Review of Systems: A 12 point ROS discussed and pertinent positives are indicated in the HPI above.  All other systems are negative.  Review of Systems  Constitutional: Negative for activity change, fatigue and fever.  HENT: Negative for sore throat, tinnitus and trouble swallowing.   Eyes: Negative for visual disturbance.  Respiratory: Negative for cough and shortness of breath.   Cardiovascular: Negative for chest pain.  Gastrointestinal: Negative for abdominal pain, nausea and vomiting.  Musculoskeletal: Negative for back pain and gait problem.  Neurological:  Positive for headaches. Negative for dizziness, tremors, seizures, syncope, facial asymmetry, speech difficulty, weakness, light-headedness and numbness.  Psychiatric/Behavioral: Negative for behavioral problems and confusion.    Vital Signs: BP 124/84   Pulse 86   Temp 98 F (36.7 C) (Oral)   Resp 20   Ht 5' 10"  (1.778 m)   Wt 164 lb (74.4 kg)   SpO2 96%   BMI 23.53 kg/m   Physical Exam Vitals reviewed.  HENT:     Mouth/Throat:     Mouth: Mucous membranes are moist.  Eyes:     Extraocular Movements: Extraocular movements intact.  Cardiovascular:     Rate and Rhythm: Normal rate and regular rhythm.     Heart sounds: Normal heart sounds.  Pulmonary:     Effort: Pulmonary effort is normal.     Breath sounds: Normal breath sounds.  Abdominal:     Palpations: Abdomen is soft.     Tenderness: There is no abdominal tenderness.  Musculoskeletal:        General: Normal range of motion.     Cervical back: Normal range of motion.     Right lower leg: No edema.     Left lower leg: No edema.  Skin:    General: Skin is warm.  Neurological:     Mental Status: He is alert and oriented to person, place, and time.  Psychiatric:        Behavior: Behavior normal.     Imaging: No results found.  Labs:  CBC: Recent Labs    05/30/19 1800 12/16/19 1826 02/10/20 0918 05/11/20 0730  WBC 5.3 10.1 4.5 8.5  HGB 14.1 12.8* 12.3* 12.8*  HCT 40.6 38.8* 36.4* 40.0  PLT 198 204 167 237    COAGS: Recent Labs    05/14/19 0645  INR 1.0  APTT 30    BMP: Recent Labs    05/14/19 0645 05/15/19 0530 05/30/19 1800 12/16/19 1826 02/10/20 0918  NA 139 140 134* 138 141  K 3.6 3.6 3.3* 4.0 4.2  CL 107 107 101 104 107  CO2 24 23 22 24 30   GLUCOSE 103* 128* 120* 104* 115*  BUN 29* 31* 27* 23 20  CALCIUM 9.2 8.6* 8.5* 9.6 9.1  CREATININE 1.41* 1.38* 1.54* 1.30* 1.48*  GFRNONAA 50* 51* 45* 55*  --   GFRAA 58* 59* 52* >60  --     LIVER FUNCTION TESTS: Recent Labs     07/24/19 0831 10/27/19 0811 12/16/19 1826 01/26/20 0805 02/10/20 0918  BILITOT 0.3 0.3 0.8 0.4 0.3  AST 22 16 18 22 15   ALT 18 13 17 17 12   ALKPHOS 82 95 76 84  --   PROT 6.6 6.6 7.2 6.4 6.0*  ALBUMIN 4.2 4.1 4.0 4.3  --     TUMOR MARKERS: No results for input(s): AFPTM, CEA, CA199, CHROMGRNA in the last 8760 hours.  Assessment and Plan:  Known R ICA stenosis  Post angioplasty in NIR 05/14/19 Rt side headaches off and on since Aug 2021 Imaging revealing Stenosis right supraclinoid internal carotid artery unchanged approximately 60% diameter stenosis Scheduled now for follow up cerebral arteriogram in IR Risks and benefits of cerebral angiogram with intervention were discussed with the patient including, but not limited to bleeding, infection, vascular injury, contrast induced renal failure, stroke or even death.  This interventional procedure involves the use of X-rays and because of the nature of the planned procedure, it is possible that we will have prolonged use of X-ray fluoroscopy.  Potential radiation risks to you include (but are not limited to) the following: - A slightly elevated risk for cancer  several years later in life. This risk is typically less than 0.5% percent. This risk is low in comparison to the normal incidence of human cancer, which is 33% for women and 50% for men according to the [place]. - Radiation induced injury can include skin redness, resembling a rash, tissue breakdown / ulcers and hair loss (which can be temporary or permanent).   The likelihood of either of these occurring depends on the difficulty of the procedure and whether you are sensitive to radiation due to previous procedures, disease, or genetic conditions.   IF your procedure requires a prolonged use of radiation, you will be notified and given written instructions for further action.  It is your responsibility to monitor the irradiated  area for the 2 weeks following the  procedure and to notify your physician if you are concerned that you have suffered a radiation induced injury.    All of the patient's questions were answered, patient is agreeable to proceed.  Consent signed and in chart.  Thank you for this interesting consult.  I greatly enjoyed meeting LYSANDER CALIXTE and look forward to participating in their care.  A copy of this report was sent to the requesting provider on this date.  Electronically Signed: Lavonia Drafts, PA-C 05/11/2020, 7:56 AM   I spent a total of    25 Minutes in face to face in clinical consultation, greater than 50% of which was counseling/coordinating care for cerebral arteriogram

## 2020-05-11 NOTE — Sedation Documentation (Signed)
Unable to gain access through right wrist, TR Band in place. Now accessing right groin

## 2020-05-11 NOTE — Procedures (Addendum)
S/P 4 vessel cerebral artreriogram RT rad and RT CFA approach. Findings. 1.Approx 40 to 45 Stenosis RT ICA supraclinoid seg.Marland Kitchen 2.Apprx 50 stenosis sup division Lt MCA prox and less than 50 % prox inf division Lt MCA. 3.Severe stenosis of prox RT ACA  prox S.Shawne Bulow MD

## 2020-05-11 NOTE — Sedation Documentation (Signed)
250 NS bolus given due to 3 consistent readings in the 89V systolic

## 2020-05-14 ENCOUNTER — Ambulatory Visit (HOSPITAL_COMMUNITY): Payer: Medicare HMO

## 2020-05-18 ENCOUNTER — Other Ambulatory Visit: Payer: Self-pay | Admitting: Cardiovascular Disease

## 2020-05-26 ENCOUNTER — Encounter: Payer: Self-pay | Admitting: Family Medicine

## 2020-05-26 ENCOUNTER — Other Ambulatory Visit: Payer: Self-pay

## 2020-05-26 ENCOUNTER — Ambulatory Visit (INDEPENDENT_AMBULATORY_CARE_PROVIDER_SITE_OTHER): Payer: Medicare HMO | Admitting: Family Medicine

## 2020-05-26 DIAGNOSIS — K629 Disease of anus and rectum, unspecified: Secondary | ICD-10-CM

## 2020-05-26 MED ORDER — VALACYCLOVIR HCL 1 G PO TABS: 1000.0000 mg | ORAL_TABLET | Freq: Two times a day (BID) | ORAL | 0 refills | Status: DC

## 2020-05-26 MED ORDER — AMOXICILLIN-POT CLAVULANATE 875-125 MG PO TABS: 1.0000 | ORAL_TABLET | Freq: Two times a day (BID) | ORAL | 0 refills | Status: DC

## 2020-05-26 NOTE — Progress Notes (Unsigned)
    SUBJECTIVE:   CHIEF COMPLAINT / HPI:   Rectal issues Patient reports he has had issues with abscesses in and around his rectum in the past.  He has recently noticed pus as well as blood in the stool and draining from his rectum.  This has been going on for approximately 1 week.  Denies any severe pain but just discomfort when he wipes.  Reports the only time this happened was approximately a year ago and he did nothing and it resolved on its own using hemorrhoid cream.  Is wondering if any of his HIV medications could be causing this issue.  Patient reports that he was recently seen by his infectious disease doctor who reported his HIV was undetectable and his CD4 count was okay.  PERTINENT  PMH / PSH: HIV  OBJECTIVE:   BP 139/80   Pulse (!) 116   Ht 5' 7"  (1.702 m)   Wt 162 lb (73.5 kg)   SpO2 96%   BMI 25.37 kg/m   General: Well-appearing 75 year old male, no acute distress Respiratory: Normal work of breathing Rectal exam: A chaperone was present for this exam, multiple erythematous oozing lesions noted all around his rectum going into the rectum.  Nontender to touch.  Serosanguineous drainage from these lesions.  Not warm to touch.  ASSESSMENT/PLAN:   Rectal abnormality Patient with draining lesions on his rectum for approximately 1 week.  Unclear etiology but appears bacterial.  HSV is also on the differential given its recurrence although HSV lesions are typically extremely painful and the patient is not acknowledging this. - Prescription for Augmentin for 7 days given to patient - Valtrex for 5 days given to patient's - We will follow-up with patient in 1 week in my clinic for reevaluation to ensure resolution of these lesions. - Strict ED precautions given     Gifford Shave, MD [place]

## 2020-05-26 NOTE — Patient Instructions (Signed)
It was wonderful seeing you today.  I am sorry you are having these issues with your bottom.  It appears to be a bacterial infection so I am giving you an antibiotic for 7 days.  There is also concerned that this may be a viral infection so I am giving you Valtrex.  I would like to see you in 7 days to make sure that these lesions have resolved.  If you have any issues between now and then please call the clinic or seek medical attention immediately.  I hope you have a wonderful afternoon!

## 2020-05-27 DIAGNOSIS — K629 Disease of anus and rectum, unspecified: Secondary | ICD-10-CM | POA: Insufficient documentation

## 2020-05-27 NOTE — Assessment & Plan Note (Signed)
Patient with draining lesions on his rectum for approximately 1 week.  Unclear etiology but appears bacterial.  HSV is also on the differential given its recurrence although HSV lesions are typically extremely painful and the patient is not acknowledging this. - Prescription for Augmentin for 7 days given to patient - Valtrex for 5 days given to patient's - We will follow-up with patient in 1 week in my clinic for reevaluation to ensure resolution of these lesions. - Strict ED precautions given

## 2020-06-01 ENCOUNTER — Other Ambulatory Visit: Payer: Self-pay | Admitting: Gastroenterology

## 2020-06-02 ENCOUNTER — Ambulatory Visit: Payer: Medicare HMO | Admitting: Family Medicine

## 2020-06-08 ENCOUNTER — Other Ambulatory Visit: Payer: Self-pay

## 2020-06-08 ENCOUNTER — Encounter: Payer: Self-pay | Admitting: Family Medicine

## 2020-06-08 ENCOUNTER — Ambulatory Visit (INDEPENDENT_AMBULATORY_CARE_PROVIDER_SITE_OTHER): Payer: Medicare HMO | Admitting: Family Medicine

## 2020-06-08 VITALS — BP 122/72 | HR 93 | Wt 162.6 lb

## 2020-06-08 DIAGNOSIS — L509 Urticaria, unspecified: Secondary | ICD-10-CM | POA: Insufficient documentation

## 2020-06-08 DIAGNOSIS — K629 Disease of anus and rectum, unspecified: Secondary | ICD-10-CM

## 2020-06-08 DIAGNOSIS — R21 Rash and other nonspecific skin eruption: Secondary | ICD-10-CM | POA: Diagnosis not present

## 2020-06-08 MED ORDER — PREDNISONE 20 MG PO TABS: 20.0000 mg | ORAL_TABLET | Freq: Every day | ORAL | 1 refills | Status: DC

## 2020-06-08 NOTE — Progress Notes (Signed)
    SUBJECTIVE:   CHIEF COMPLAINT / HPI:   Rectal concerns Patient was seen here approximately 2 weeks ago for anal ulcers.  It was unclear etiology but given the severity the patient was treated with Valtrex and an antibiotic.  He reports that since starting the medication the pain and drainage has dramatically improved.  He feels like he is doing well and has no concerns regarding this at this time.  Skin rash Patient reports that the day after he started taking his medications for his rectal issues his skin broke out on his torso and back.  He reports that the same day he also switched laundry detergents because his son brought him some laundry detergent that he had not used.  He is unsure if this was from switching laundry detergents or possibly some other cause.  He reports that "it itches a little bit".  He has not taken any medications for this.  Reports that it is only on surface is covered by his clothes and not on his palms or soles.   OBJECTIVE:   BP 122/72   Pulse 93   Wt 162 lb 9.6 oz (73.8 kg)   SpO2 98%   BMI 25.47 kg/m   General: Well-appearing 75 year old male in no acute distress Cardiac: Regular rate and rhythm, no murmurs appreciated Respiratory: Normal work of breathing, lungs clear to auscultation bilaterally Derm: Patient has maculopapular rash covering chest and back as well as arms and legs.  The rash stops at his wrists and does not progress to his hands.  See images below Rectal exam: Anal wounds are healing well, no draining, ulcers noticed (a chaperone was present for this exam)       ASSESSMENT/PLAN:   Rectal abnormality Lesions have healed.  Still unclear of the etiology but Augmentin and Valtrex seem to have helped with the symptoms. -Continue to monitor for recurrence, consider cultures if recurs -Strict return precautions given  Urticarial rash Patient with rash present for approximately 10 days.  Reports he started taking Valtrex and  Augmentin as well as switched his laundry detergents.  Given distribution it appears to be contact dermatitis from new laundry detergent.  Syphilis is also on the differential. -Treating with prednisone 20 mg for 10 days -Recommended Zyrtec (cetirizine) daily -Collecting RPR today     Gifford Shave, MD [place]

## 2020-06-08 NOTE — Assessment & Plan Note (Signed)
Patient with rash present for approximately 10 days.  Reports he started taking Valtrex and Augmentin as well as switched his laundry detergents.  Given distribution it appears to be contact dermatitis from new laundry detergent.  Syphilis is also on the differential. -Treating with prednisone 20 mg for 10 days -Recommended Zyrtec (cetirizine) daily -Collecting RPR today

## 2020-06-08 NOTE — Assessment & Plan Note (Signed)
Lesions have healed.  Still unclear of the etiology but Augmentin and Valtrex seem to have helped with the symptoms. -Continue to monitor for recurrence, consider cultures if recurs -Strict return precautions given

## 2020-06-08 NOTE — Patient Instructions (Signed)
It was a pleasure seeing you today.  I am glad that your rectal issues have improved.  I feel like the rash is most likely related to the detergents but I do want to collect some lab work today to rule out infectious causes.  For treatment I am going to prescribe prednisone 20 mg daily for 10 days.  This should be a long enough duration to resolve the symptoms.  If they return I have sent a refill in for that.  I also recommend you pick up over-the-counter Zyrtec (cetirizine) and take 1 daily.  If you have any issues, questions, concerns please feel free to call the clinic.  I hope you have a wonderful afternoon!

## 2020-06-11 ENCOUNTER — Encounter: Payer: Self-pay | Admitting: Gastroenterology

## 2020-06-11 ENCOUNTER — Ambulatory Visit (INDEPENDENT_AMBULATORY_CARE_PROVIDER_SITE_OTHER): Payer: Medicare HMO | Admitting: Gastroenterology

## 2020-06-11 ENCOUNTER — Other Ambulatory Visit: Payer: Self-pay

## 2020-06-11 VITALS — BP 144/76 | HR 84 | Ht 67.75 in | Wt 165.1 lb

## 2020-06-11 DIAGNOSIS — R1310 Dysphagia, unspecified: Secondary | ICD-10-CM

## 2020-06-11 DIAGNOSIS — Z79899 Other long term (current) drug therapy: Secondary | ICD-10-CM

## 2020-06-11 DIAGNOSIS — K219 Gastro-esophageal reflux disease without esophagitis: Secondary | ICD-10-CM

## 2020-06-11 MED ORDER — PANTOPRAZOLE SODIUM 20 MG PO TBEC: 20.0000 mg | DELAYED_RELEASE_TABLET | Freq: Two times a day (BID) | ORAL | 3 refills | Status: DC

## 2020-06-11 NOTE — Progress Notes (Signed)
HPI :  75 year old [name] here for a follow-up visit for GERD and dysphagia.  He has been on Protonix for a long time for reflux symptoms.  He has no history of Barrett's esophagus.  Main symptoms are pyrosis and regurgitation.  He has been on chronic PPIs for more than 20 years.  Previously was on Protonix 40 mg twice daily, has since in recent years been on 20 mg twice daily.  He states this has been working well for the most part.  He has rare heartburn that is usually correlates with something that he eats but otherwise this works pretty well to control his symptoms.  He does have chronic kidney disease.  He feels things are stable since I have last seen him in regards to his reflux.  He has longstanding dysphagia that is intermittent, to pills and solids occasionally such as beef or steak.  He has had prior evaluation with barium study, endoscopy with dilation and esophageal manometry.  There is been no obvious evidence of dysmotility, no overt stricturing.  Empiric dilation did not provide any benefit.  He states dysphagia has been about the same.  He tries to chew his food well and eat slowly if having chicken or beef.  He uses applesauce to swallow his pills which helps.  Dysphagia seems to be localized in his throat.  He denies any problems with his bowels at all.  About a year ago he had an intervention for vascular stenosis on his carotid artery and intracerebral vessels.  He got Covid post procedure, has had a prolonged recovery and has loss of taste and smell.  He remains on Plavix.  Prior workup: Esophageal manometry 09/03/2017 - low resting LES pressure with appropriate relaxation, no evidence of dysmotility EGD 10/16/2016 - normal esophagus, empiric dilation to 29m, biopsies to rule out EoE, gastritis noted - H pylori negative, no EoE CT scan 09/06/2017 - s/p cholecystectomy, bilateral inguinal hernia, small periumbilical hernia, no pathology, normal pancreas Colonoscopy 08/04/2015 - 2 small  polyps, internal hemorrhoids - adenomas - due for repeat 07/2022 Barium swallow 11/21/2017 - small HH, evidence of reflux, no obvious stricture, normal motility    Past Medical History:  Diagnosis Date  . Chronic kidney disease   . Colon polyps   . COVID-19    05/2019  . GERD (gastroesophageal reflux disease)   . Heart murmur   . History of kidney stones   . HIV (human immunodeficiency virus infection) ([place] 1999  . Hypertension   . Pneumonia   . Stroke ([facility]    right side weakness  . TIA (transient ischemic attack)      Past Surgical History:  Procedure Laterality Date  . CHOLECYSTECTOMY  1982  . COLONOSCOPY    . ESOPHAGEAL MANOMETRY N/A 09/03/2017   Procedure: ESOPHAGEAL MANOMETRY (EM);  Surgeon: NMauri Pole MD;  Location: [facility];  Service: Endoscopy;  Laterality: N/A;  . IR ANGIO INTRA EXTRACRAN SEL COM CAROTID INNOMINATE BILAT MOD SED  04/21/2019  . IR ANGIO INTRA EXTRACRAN SEL COM CAROTID INNOMINATE BILAT MOD SED  05/11/2020  . IR ANGIO VERTEBRAL SEL VERTEBRAL BILAT MOD SED  04/21/2019  . IR ANGIO VERTEBRAL SEL VERTEBRAL BILAT MOD SED  05/11/2020  . IR PTA INTRACRANIAL  05/14/2019  . IR [place]ASC ACCESS RIGHT  05/11/2020  . KNEE SURGERY     LEFT KNEE  . RADIOLOGY WITH ANESTHESIA N/A 05/14/2019   Procedure: IR WITH ANESTHESIA STENTING;  Surgeon: DLuanne Bras MD;  Location: [place];  Service: Radiology;  Laterality: N/A;  . TONSILLECTOMY     Family History  Problem Relation Age of Onset  . Hypertension Mother   . Heart attack Mother 11  . Colon cancer Neg Hx   . Esophageal cancer Neg Hx   . Pancreatic cancer Neg Hx   . Stomach cancer Neg Hx   . Liver disease Neg Hx    Social History   Tobacco Use  . Smoking status: Never Smoker  . Smokeless tobacco: Former Systems developer    Types: Chew  . Tobacco comment: Rare use, about q 6 months  Vaping Use  . Vaping Use: Never used  Substance Use Topics  . Alcohol use: Yes    Comment: rare  . Drug use: No    Current Outpatient Medications  Medication Sig Dispense Refill  . abacavir-dolutegravir-lamiVUDine (TRIUMEQ) 600-50-300 MG tablet Take 1 tablet by mouth daily. 30 tablet 11  . amLODipine (NORVASC) 10 MG tablet Take 1 tablet (10 mg total) by mouth daily. Pt needs to keep upcoming appt in March for further refills 60 tablet 0  . amoxicillin-clavulanate (AUGMENTIN) 875-125 MG tablet Take 1 tablet by mouth 2 (two) times daily. 14 tablet 0  . Ascorbic Acid (VITAMIN C) 1000 MG tablet Take 1,000 mg by mouth daily at 6 PM.    . aspirin EC 81 MG tablet Take 1 tablet (81 mg total) by mouth daily.    . Cholecalciferol (VITAMIN D3) 125 MCG (5000 UT) TABS Take 5,000 Units by mouth daily at 6 PM.    . clopidogrel (PLAVIX) 75 MG tablet Take 1 tablet by mouth once daily (Patient taking differently: Take 75 mg by mouth daily.) 90 tablet 0  . finasteride (PROSCAR) 5 MG tablet Take 5 mg by mouth daily.    . pantoprazole (PROTONIX) 20 MG tablet Take 1 tablet (20 mg total) by mouth 2 (two) times daily. Please call to schedule a yearly follow up office visit. Thank you 60 tablet 1  . rosuvastatin (CRESTOR) 20 MG tablet Take 1 tablet (20 mg total) by mouth daily. (Patient taking differently: Take 20 mg by mouth daily at 6 PM.) 90 tablet 3  . valACYclovir (VALTREX) 1000 MG tablet Take 1 tablet (1,000 mg total) by mouth 2 (two) times daily. 20 tablet 0  . Zinc 50 MG TABS Take 50 mg by mouth daily at 6 PM.     No current facility-administered medications for this visit.   Allergies  Allergen Reactions  . Baclofen Other (See Comments)    Back pain. Patient said it made him feel crazy   . Efavirenz Rash  . Nevirapine Rash and Other (See Comments)     Review of Systems: All systems reviewed and negative except where noted in HPI.   Lab Results  Component Value Date   WBC 8.5 05/11/2020   HGB 12.8 (L) 05/11/2020   HCT 40.0 05/11/2020   MCV 99.3 05/11/2020   PLT 237 05/11/2020    Lab Results  Component  Value Date   CREATININE 1.34 (H) 05/11/2020   BUN 23 05/11/2020   NA 136 05/11/2020   K 3.4 (L) 05/11/2020   CL 103 05/11/2020   CO2 23 05/11/2020    Lab Results  Component Value Date   ALT 12 02/10/2020   AST 15 02/10/2020   ALKPHOS 84 01/26/2020   BILITOT 0.3 02/10/2020     Physical Exam: BP (!) 144/76 (BP Location: Left Arm, Patient Position: Sitting, Cuff Size: Normal)  Pulse 84   Ht 5' 7.75" (1.721 m) Comment: height measured without shoes  Wt 165 lb 2 oz (74.9 kg)   BMI 25.29 kg/m  Constitutional: Pleasant,well-developed, [name] in no acute distress. Neurological: Alert and oriented to person place and time. Psychiatric: Normal mood and affect. Behavior is normal.   ASSESSMENT AND PLAN: 75 year old [name] here for reassessment of the following:  GERD Dysphagia Long-term use of proton pump inhibitor  Generally has pretty good control with 20 mg twice daily of Protonix and at baseline has good control of symptoms.  Occasionally has breakthrough with trigger foods.  He does have chronic kidney disease which appears stable.  We did discuss long-term risk benefits of chronic PPI use.  He will try to reduce dose to 20 mg a day and see if that will hold him, if not he can increase back to twice daily as needed.  Regarding his dysphagia he has had a very thorough evaluation and I reassured him I do not see anything concerning but he continues to have some occasional symptoms which appear quite proximal.  I discussed options with him and we could consider a modified barium swallow to evaluate his swallowing mechanism if he wanted further evaluation.  After discussion of this he wants to hold off for now but will consider if he gets worse.  He is watching how he eats and chews well, seems to make symptoms fairly minimal form at this time.  He can follow-up with me in 1 year or sooner with any questions.  [place] Cellar, MD [facility]

## 2020-06-11 NOTE — Patient Instructions (Signed)
If you are age 73 or older, your body mass index should be between 23-30. Your Body mass index is 25.29 kg/m. If this is out of the aforementioned range listed, please consider follow up with your Primary Care Provider.  If you are age 47 or younger, your body mass index should be between 19-25. Your Body mass index is 25.29 kg/m. If this is out of the aformentioned range listed, please consider follow up with your Primary Care Provider.   We have sent the following medications to your pharmacy for you to pick up at your convenience: Protonix 20 mg: Take twice a day  Thank you for entrusting me with your care and for choosing [employer], Dr. Kerkhoven Cellar

## 2020-06-14 ENCOUNTER — Telehealth: Payer: Self-pay | Admitting: Family Medicine

## 2020-06-14 NOTE — Telephone Encounter (Signed)
Called patient regarding RPR results.  He reports that he has not been sexually active in 3 years.  Reports that he may have had syphilis when he was in [place] in the 1970s.  Given the titer this is most likely an active infection but will wait for antibody results before treatment.  I will call the patient when results come back.

## 2020-06-16 LAB — RPR: RPR Ser Ql: REACTIVE — AB

## 2020-06-16 LAB — RPR, QUANT+TP ABS (REFLEX)
Rapid Plasma Reagin, Quant: 1:32 {titer} — ABNORMAL HIGH
T Pallidum Abs: REACTIVE — AB

## 2020-06-16 NOTE — Telephone Encounter (Signed)
Called patient regarding RPR and antibody results. Scheduled him ofr appointment on 2/14 for treatment of syphilis and need G/C testing with anal swab.

## 2020-06-21 ENCOUNTER — Encounter: Payer: Self-pay | Admitting: Family Medicine

## 2020-06-21 ENCOUNTER — Other Ambulatory Visit (HOSPITAL_COMMUNITY)
Admission: RE | Admit: 2020-06-21 | Discharge: 2020-06-21 | Disposition: A | Discharge status: Home / Self Care | Payer: Medicare HMO | Source: Ambulatory Visit | Attending: Family Medicine | Admitting: Family Medicine

## 2020-06-21 ENCOUNTER — Other Ambulatory Visit: Payer: Self-pay

## 2020-06-21 ENCOUNTER — Ambulatory Visit (INDEPENDENT_AMBULATORY_CARE_PROVIDER_SITE_OTHER): Payer: Medicare HMO | Admitting: Family Medicine

## 2020-06-21 VITALS — BP 138/72 | HR 95 | Ht 68.0 in | Wt 167.4 lb

## 2020-06-21 DIAGNOSIS — A539 Syphilis, unspecified: Secondary | ICD-10-CM

## 2020-06-21 DIAGNOSIS — Z113 Encounter for screening for infections with a predominantly sexual mode of transmission: Secondary | ICD-10-CM

## 2020-06-21 MED ORDER — PENICILLIN G BENZATHINE 1200000 UNIT/2ML IM SUSP: 2.4000 10*6.[IU] | Freq: Once | INTRAMUSCULAR | Status: DC

## 2020-06-21 NOTE — Patient Instructions (Signed)
It was great seeing you today.  We went ahead and checked for other STDs which were recommended by your infectious disease doctors.  We are also treating you for the syphilis with penicillin.  I will call you with the results from the other tests.  If you have any questions or concerns please feel free to call the clinic.  I hope you have a wonderful afternoon!   Syphilis Syphilis is an infection that can spread through sexual contact. The infection can cause serious complications, so it is important to get treatment right away. There are four stages of syphilis:  Primary stage. During this stage sores may form where the disease entered your body.  Secondary stage. During this stage skin rashes and lesions will form.  Latent stage. During this stage there are no symptoms, but the infection may still be contagious.  Tertiary stage. This stage happens 10-30 years after the infection starts. During this stage, the disease damages organs and can lead to death. Most people do not develop this stage of syphilis. What are the causes? This condition is caused by bacteria called Treponema pallidum. The condition can spread during sexual activity, such as during oral, anal, or vaginal sex. It can also be spread from mother to fetus during pregnancy. What increases the risk? You are more likely to develop this condition if:  You do not use a condom.  You have or had another sexually transmitted infection (STI).  You have multiple sex partners.  You use illegal drugs through an IV. What are the signs or symptoms? Symptoms of this condition depend on the stage of the disease. Primary stage  Painless sores (chancres) in and around the genital organs, mouth, or hands. The sores are usually firm and round. Secondary stage  A rash or sores. The rash usually does not itch.  A fever.  A headache.  A sore throat.  Swollen lymph nodes.  New sores in the mouth or on the genitals.  A feeling of  being ill.  Pain in the joints.  Patchy hair loss.  Weight loss.  Fatigue. Latent stage There are no symptoms during this stage. Tertiary stage  Dementia.  Personality and mood changes.  Difficulty walking and coordinating movements.  Muscle weakness or paralysis.  Problems with coordination.  Heart failure.  Trouble breathing.  Fainting.  Soft, rubbery growths on the skin, bones, or liver (gummas).  Sudden "lightning" pains, numbness, or tingling.  Vision changes.  Hearing changes.  Trouble controlling your urine and bowel movements. How is this diagnosed? This condition is diagnosed with:  A physical exam.  Blood tests.  Tests of the fluid (drainage) from a sore or rash.  Tests of the fluid around the spine (lumbar puncture). These tests are done to check for an infection in the brain or nervous system.  Imaging tests. These may be done to check for damage to the heart, aorta, or brain if the condition is in the tertiary stage. Tests may include: ? An X-ray. ? A CT scan. ? An MRI. ? An echocardiogram. This test takes a picture of the heart. ? An ultrasound.   How is this treated? This condition can be cured with antibiotic medicine. During the first day of treatment, the medicine may cause you to experience fever, chills, headache, nausea, or aching all over your body. This is normal and should go away within 24 hours. Follow these instructions at home: Medicines  Take over-the-counter and prescription medicines only as told by  your health care provider.  Take your antibiotic medicine as told by your health care provider. Do not stop taking the antibiotic even if you start to feel better. Incomplete treatment will put you at risk for continued infection and could be life threatening.   General instructions  Do not have sex until your treatment is completed, or as directed by your health care provider.  Tell your recent sexual partners that you were  diagnosed with syphilis. It is important that they get treatment, even if they do not have symptoms.  Keep all follow-up visits as told by your health care provider. This is important. How is this prevented?  Use latex condoms correctly whenever you have sex.  Before you have sex, ask your partner if he or she has been tested for STIs. Ask about the test results.  Avoid having multiple sexual partners. Contact a health care provider if:  You continue to have any of the following symptoms 24 hours after beginning treatment: ? Fever. ? Chills. ? Headache. ? Nausea. ? Aching all over your body.  Your symptoms do not improve, even with treatment. Get help right away if:  You have severe chest pain.  You have trouble walking or coordinating movements.  You are confused.  You lose vision or hearing.  You have numbness in your arms or legs.  You have a seizure.  You faint.  You have a severe headache that does not go away with medicine. Summary  Syphilis is an infection that can spread through sexual contact.  This condition can cause serious complications, so it is best to get treatment right away. The condition can be cured with antibiotic medicine.  This condition can also be spread from mother to fetus during pregnancy.  Take your antibiotic medicine as told by your health care provider.  Tell your recent sexual partners that you were diagnosed with syphilis. It is important that they get treatment, even if they do not have symptoms. This information is not intended to replace advice given to you by your health care provider. Make sure you discuss any questions you have with your health care provider. Document Revised: 11/05/2018 Document Reviewed: 06/20/2016 Elsevier Patient Education  2021 [employer].

## 2020-06-21 NOTE — Progress Notes (Signed)
    SUBJECTIVE:   CHIEF COMPLAINT / HPI:   Syphilis Patient presents for follow-up and was positive syphilis test.  His rash has resolved and the ulcers around his rectum have resolved.  Patient is adamant that he has not been sexually active well over a year.  Reports that before he was sexually active with no men never had anal intercourse.  Reports that he did have syphilis when he was in the [place] 40 years ago which was treated with penicillin.  I discussed the patient's case with the infectious disease team and they recommended testing for other STIs.  Patient is agreeable to this testing.   OBJECTIVE:   BP 138/72   Pulse 95   Ht 5' 8"  (1.727 m)   Wt 167 lb 6.4 oz (75.9 kg)   SpO2 96%   BMI 25.45 kg/m   General: Well-appearing 75 year old male, no acute distress Cardiac: Regular appreciated. Rectal exam: Patient has had healed melena longer present (nurse present for this evaluation) Derm: Erythematous rash has resolved  ASSESSMENT/PLAN:   Syphilis (acquired) Patient treponemal antibody as well as RPR 1:32 titer.  Symptoms have resolved patient for treatment.  After STI testing.  Swabs collected and patient tenderness, throat, and urine.  Testing for gonorrhea chlamydia. -Patient will need 2,400,000 units of Bicillin 5 unfortunately we are out in our clinic at this time.  Order has been placed and patient will come back when he gets the medication -Strict return     Gifford Shave, MD [place]

## 2020-06-22 DIAGNOSIS — A539 Syphilis, unspecified: Secondary | ICD-10-CM | POA: Insufficient documentation

## 2020-06-22 LAB — URINE CYTOLOGY ANCILLARY ONLY
Chlamydia: NEGATIVE
Comment: NEGATIVE
Comment: NORMAL
Neisseria Gonorrhea: NEGATIVE

## 2020-06-22 LAB — CERVICOVAGINAL ANCILLARY ONLY
Chlamydia: NEGATIVE
Chlamydia: NEGATIVE
Comment: NEGATIVE
Comment: NEGATIVE
Comment: NORMAL
Comment: NORMAL
Neisseria Gonorrhea: NEGATIVE
Neisseria Gonorrhea: NEGATIVE

## 2020-06-22 NOTE — Assessment & Plan Note (Signed)
Patient treponemal antibody as well as RPR 1:32 titer.  Symptoms have resolved patient for treatment.  After STI testing.  Swabs collected and patient tenderness, throat, and urine.  Testing for gonorrhea chlamydia. -Patient will need 2,400,000 units of Bicillin 5 unfortunately we are out in our clinic at this time.  Order has been placed and patient will come back when he gets the medication -Strict return

## 2020-06-28 ENCOUNTER — Ambulatory Visit (INDEPENDENT_AMBULATORY_CARE_PROVIDER_SITE_OTHER): Payer: Medicare HMO

## 2020-06-28 ENCOUNTER — Other Ambulatory Visit: Payer: Self-pay

## 2020-06-28 DIAGNOSIS — A539 Syphilis, unspecified: Secondary | ICD-10-CM

## 2020-06-28 MED ORDER — PENICILLIN G BENZATHINE 1200000 UNIT/2ML IM SUSP
2.4000 10*6.[IU] | Freq: Once | INTRAMUSCULAR | Status: AC
  Administered 2020-06-28: 2.4 10*6.[IU] via INTRAMUSCULAR

## 2020-06-28 NOTE — Progress Notes (Signed)
Patient presents in nurse clinic for Syphilis treatment.  2.4 million units of penicillin divided into two doses. 1.2 million units administered in [place], 1.2 million units administered in [place].  Patient to follow up in 2-3 months for re-screening.   STD report form fax completed and faxed to [facility] Department at [phone] (STD department).

## 2020-07-06 ENCOUNTER — Ambulatory Visit: Payer: Medicare HMO | Admitting: Cardiovascular Disease

## 2020-07-06 ENCOUNTER — Other Ambulatory Visit: Payer: Self-pay | Admitting: Cardiovascular Disease

## 2020-07-08 DIAGNOSIS — R222 Localized swelling, mass and lump, trunk: Secondary | ICD-10-CM | POA: Diagnosis not present

## 2020-07-08 DIAGNOSIS — R109 Unspecified abdominal pain: Secondary | ICD-10-CM | POA: Diagnosis not present

## 2020-07-13 ENCOUNTER — Encounter: Payer: Self-pay | Admitting: Family Medicine

## 2020-07-13 ENCOUNTER — Other Ambulatory Visit: Payer: Self-pay

## 2020-07-13 ENCOUNTER — Ambulatory Visit (INDEPENDENT_AMBULATORY_CARE_PROVIDER_SITE_OTHER): Payer: Medicare HMO | Admitting: Family Medicine

## 2020-07-13 VITALS — BP 132/78 | HR 73 | Ht 70.0 in | Wt 166.6 lb

## 2020-07-13 DIAGNOSIS — R0789 Other chest pain: Secondary | ICD-10-CM | POA: Insufficient documentation

## 2020-07-13 DIAGNOSIS — R0781 Pleurodynia: Secondary | ICD-10-CM

## 2020-07-13 NOTE — Assessment & Plan Note (Signed)
Patient presents with 3 months of left-sided anterior lateral rib tenderness to palpation with occasional pain with inspiration.  No personal history of cancer.  No symptoms concerning for pulmonary disease other than patient's chronic issues that have been worked up in the past.  He is tender to palpation over the area where he endorses pain.  Recent x-ray done at home by [facility] was in Dr. Francesca Oman box and it states the x-ray was normal.  Patient says he was told there was "something" on the x-ray.  He states they also did an ultrasound that was normal.  I cannot find any recent CT scans of his chest.  Most likely MSK or neuralgia but I would like to rule out malignancy given his tenderness without history of trauma.  CT chest with contrast ordered.  We'll follow up with patient after results of CT scan.

## 2020-07-13 NOTE — Progress Notes (Signed)
    SUBJECTIVE:   CHIEF COMPLAINT / HPI:   Patient states that for the past 3 months he has been having left-sided rib tenderness and pain with inhalation that lasts a few seconds.  This is occasional.  No worsening shortness of breath.  Has had shortness of breath since Covid last year.  Has a history of cough that is being worked up with no cause identified.  Cough is not worse now.  No worsening DOE.  He is a non-smoker.  Occasionally used dip tobacco in the past.  Compliant with his HIV medications.  States his viral load has been undetectable.  No fever/chills or N/V/D.  He has no personal history of cancer.  Has a family history of cancer on both sides he states.   PERTINENT  PMH / PSH: HIV  OBJECTIVE:   BP 132/78   Pulse 73   Ht 5' 10"  (1.778 m)   Wt 166 lb 9.6 oz (75.6 kg)   SpO2 99%   BMI 23.90 kg/m   General: Alert and oriented.  No acute distress. CV: Regular rate and rhythm, no murmurs Pulmonary: Some mild inspiratory crackles on the base of right side.  Normal auscultation on the left side. MSK: Tenderness to palpation over the ninth and 10th rib on the left anterior lateral side.  ASSESSMENT/PLAN:   Rib pain Patient presents with 3 months of left-sided anterior lateral rib tenderness to palpation with occasional pain with inspiration.  No personal history of cancer.  No symptoms concerning for pulmonary disease other than patient's chronic issues that have been worked up in the past.  He is tender to palpation over the area where he endorses pain.  Recent x-ray done at home by [facility] was in Dr. Francesca Oman box and it states the x-ray was normal.  Patient says he was told there was "something" on the x-ray.  He states they also did an ultrasound that was normal.  I cannot find any recent CT scans of his chest.  Most likely MSK or neuralgia but I would like to rule out malignancy given his tenderness without history of trauma.  CT chest with contrast ordered.  We'll follow  up with patient after results of CT scan.     Benay Pike, MD [place]

## 2020-07-13 NOTE — Patient Instructions (Signed)
It was nice to meet you today,  I have ordered a CT of the chest to see if we can find a cause of your rib pain for the past 3 months.  Someone will call you to schedule this.  Have a great day,  Clemetine Marker, MD

## 2020-07-22 ENCOUNTER — Other Ambulatory Visit: Payer: Self-pay | Admitting: Family Medicine

## 2020-07-22 NOTE — Progress Notes (Unsigned)
Opened in error

## 2020-08-04 ENCOUNTER — Ambulatory Visit
Admission: RE | Admit: 2020-08-04 | Discharge: 2020-08-04 | Disposition: A | Discharge status: Home / Self Care | Payer: Medicare HMO | Source: Ambulatory Visit | Attending: Family Medicine | Admitting: Family Medicine

## 2020-08-04 DIAGNOSIS — J479 Bronchiectasis, uncomplicated: Secondary | ICD-10-CM | POA: Diagnosis not present

## 2020-08-04 DIAGNOSIS — Z9049 Acquired absence of other specified parts of digestive tract: Secondary | ICD-10-CM | POA: Diagnosis not present

## 2020-08-04 DIAGNOSIS — J8489 Other specified interstitial pulmonary diseases: Secondary | ICD-10-CM | POA: Diagnosis not present

## 2020-08-04 DIAGNOSIS — I2510 Atherosclerotic heart disease of native coronary artery without angina pectoris: Secondary | ICD-10-CM | POA: Diagnosis not present

## 2020-08-04 DIAGNOSIS — R0781 Pleurodynia: Secondary | ICD-10-CM

## 2020-08-04 MED ORDER — IOPAMIDOL (ISOVUE-300) INJECTION 61%
75.0000 mL | Freq: Once | INTRAVENOUS | Status: AC | PRN
  Administered 2020-08-04: 75 mL via INTRAVENOUS

## 2020-08-06 ENCOUNTER — Telehealth: Payer: Self-pay

## 2020-08-06 NOTE — Telephone Encounter (Signed)
I have forwarded to ordering provider.

## 2020-08-06 NOTE — Telephone Encounter (Signed)
Patient calls nurse line requesting results of CT scan from 3/30.  Please advise.   Talbot Grumbling, RN

## 2020-08-08 NOTE — Progress Notes (Signed)
   Subjective:   Patient ID: Jeffrey [name]    DOB: [birth year], 75 y.o. male   MRN: [id number]  Jeffrey [name] is a 75 y.o. male with a history of internal carotid artery stenosis on right, HTN, diastolic CHF, CAD, rectal abnormality, GERD, dysphagia, peripheral vertigo, cervical lymphadenopathy, CKD, BPH, syphilis, h/o PNA, occipital neuralgia, neck pain, HLD, HIV, exertional dyspnea, ED, cholecystectomy, h/o chest pain  here for rib pain follow up  Rib Pain: Patient presents today for left-sided rib pain.  He notes that this is basically resolved.  Endorses intermittent short lasting pain.  Denies any chest pain. Endorses chronic shortness of breath after Covid which has not worsened.  Was thankful that CT scan was negative.  Coronary Calcifications of LAD: CT chest notable for calcifications in LAD coronary artery. Known CAD with carotid artery stenosis. Currently on Crestor 15m QD.  Review of Systems:  Per HPI.   Objective:   BP 140/80   Pulse 83   Ht 5' 10"  (1.778 m)   Wt 170 lb 6.4 oz (77.3 kg)   SpO2 98%   BMI 24.45 kg/m  Vitals and nursing note reviewed.  General: pleasant older male, sitting comfortably in exam chair, well nourished, well developed, in no acute distress with non-toxic appearance CV: regular rate and rhythm without murmurs, rubs, or gallops Lungs: clear to auscultation bilaterally with normal work of breathing on room air, speaking in full sentences Skin: warm, dry, no rashes  MSK:  gait normal, no tender to palpation along left rib cage or sternum  Neuro: Alert and oriented, speech normal   Prior Imaging: CT chest 08/04/20: IMPRESSION: 1. No acute findings and no explanation for patient's chest wall pain. 2. Mild chronic interstitial changes identified within the lungs including cylindrical bronchiectasis and peripheral interstitial reticulation. No signs of acute cardiopulmonary abnormality noted. 3. Coronary artery calcifications including  calcifications in the LAD coronary artery  Assessment & Plan:   CAD (coronary artery disease) Currently on Crestor 20 mg daily.  May benefit from high intensity given evidence of LAD coronary artery calcifications on most recent CT chest.  Will discuss at annual exam.  Rib pain Resolved.  Normal CT chest.  No further evaluation or management indicated.  Suspect most likely MSK in origin.  Recommended heat, oral and topical analgesics as needed.  Return if recurs.  Essential hypertension Chronic. Slightly elevated today. Not assessed. Patient to return for annual exam at earliest convenience. Will continue to monitor and adjust regimen if indicated.  No orders of the defined types were placed in this encounter.  No orders of the defined types were placed in this encounter.     KMina Marble DO PGY-3, [facility] 08/10/2020 2:04 PM

## 2020-08-10 ENCOUNTER — Encounter: Payer: Self-pay | Admitting: Family Medicine

## 2020-08-10 ENCOUNTER — Other Ambulatory Visit: Payer: Self-pay

## 2020-08-10 ENCOUNTER — Ambulatory Visit (INDEPENDENT_AMBULATORY_CARE_PROVIDER_SITE_OTHER): Payer: Medicare HMO | Admitting: Family Medicine

## 2020-08-10 DIAGNOSIS — R0781 Pleurodynia: Secondary | ICD-10-CM | POA: Diagnosis not present

## 2020-08-10 DIAGNOSIS — I2510 Atherosclerotic heart disease of native coronary artery without angina pectoris: Secondary | ICD-10-CM | POA: Diagnosis not present

## 2020-08-10 DIAGNOSIS — I10 Essential (primary) hypertension: Secondary | ICD-10-CM | POA: Diagnosis not present

## 2020-08-10 DIAGNOSIS — R0789 Other chest pain: Secondary | ICD-10-CM

## 2020-08-10 NOTE — Assessment & Plan Note (Signed)
Chronic. Slightly elevated today. Not assessed. Patient to return for annual exam at earliest convenience. Will continue to monitor and adjust regimen if indicated.

## 2020-08-10 NOTE — Assessment & Plan Note (Signed)
Resolved.  Normal CT chest.  No further evaluation or management indicated.  Suspect most likely MSK in origin.  Recommended heat, oral and topical analgesics as needed.  Return if recurs.

## 2020-08-10 NOTE — Patient Instructions (Signed)
It was a pleasure to see you today!  Thank you for choosing [facility] for your primary care.   I am so glad your rib pain feels better!!   To keep you healthy, please keep in mind the following health maintenance items that you are due for:   1. Colonoscopy due in 07/2022 based on GI recommendations 2.  COVID #3   We are checking some labs today, I will call you if they are abnormal will send you a MyChart message or a letter if they are normal.  If you do not hear about your labs in the next 2 weeks please let us know.  BRING ALL OF YOUR MEDICATIONS WITH YOU TO EVERY VISIT   You should return to our clinic in 1-2 months for annual exam and blood pressure  Best Wishes,   Mina Marble, DO

## 2020-08-10 NOTE — Telephone Encounter (Signed)
Called pt and discussed results with him on 4/1.

## 2020-08-10 NOTE — Assessment & Plan Note (Signed)
Currently on Crestor 20 mg daily.  May benefit from high intensity given evidence of LAD coronary artery calcifications on most recent CT chest.  Will discuss at annual exam.

## 2020-09-06 ENCOUNTER — Other Ambulatory Visit: Payer: Self-pay

## 2020-09-06 ENCOUNTER — Inpatient Hospital Stay (HOSPITAL_COMMUNITY)
Admission: EM | Admit: 2020-09-06 | Discharge: 2020-09-09 | DRG: 371 | Disposition: A | Discharge status: Home / Self Care | Payer: Medicare HMO | Attending: Family Medicine | Admitting: Family Medicine

## 2020-09-06 ENCOUNTER — Encounter (HOSPITAL_COMMUNITY): Payer: Self-pay

## 2020-09-06 ENCOUNTER — Emergency Department (HOSPITAL_COMMUNITY): Payer: Medicare HMO

## 2020-09-06 DIAGNOSIS — R109 Unspecified abdominal pain: Secondary | ICD-10-CM | POA: Diagnosis not present

## 2020-09-06 DIAGNOSIS — Z888 Allergy status to other drugs, medicaments and biological substances status: Secondary | ICD-10-CM

## 2020-09-06 DIAGNOSIS — E876 Hypokalemia: Secondary | ICD-10-CM | POA: Diagnosis not present

## 2020-09-06 DIAGNOSIS — Z79899 Other long term (current) drug therapy: Secondary | ICD-10-CM | POA: Diagnosis not present

## 2020-09-06 DIAGNOSIS — R197 Diarrhea, unspecified: Secondary | ICD-10-CM | POA: Diagnosis present

## 2020-09-06 DIAGNOSIS — Z8673 Personal history of transient ischemic attack (TIA), and cerebral infarction without residual deficits: Secondary | ICD-10-CM

## 2020-09-06 DIAGNOSIS — E785 Hyperlipidemia, unspecified: Secondary | ICD-10-CM | POA: Diagnosis present

## 2020-09-06 DIAGNOSIS — Z7902 Long term (current) use of antithrombotics/antiplatelets: Secondary | ICD-10-CM | POA: Diagnosis not present

## 2020-09-06 DIAGNOSIS — B20 Human immunodeficiency virus [HIV] disease: Secondary | ICD-10-CM | POA: Diagnosis present

## 2020-09-06 DIAGNOSIS — U071 COVID-19: Secondary | ICD-10-CM | POA: Diagnosis not present

## 2020-09-06 DIAGNOSIS — I130 Hypertensive heart and chronic kidney disease with heart failure and stage 1 through stage 4 chronic kidney disease, or unspecified chronic kidney disease: Secondary | ICD-10-CM | POA: Diagnosis not present

## 2020-09-06 DIAGNOSIS — Z9049 Acquired absence of other specified parts of digestive tract: Secondary | ICD-10-CM | POA: Diagnosis not present

## 2020-09-06 DIAGNOSIS — I5032 Chronic diastolic (congestive) heart failure: Secondary | ICD-10-CM | POA: Diagnosis present

## 2020-09-06 DIAGNOSIS — N400 Enlarged prostate without lower urinary tract symptoms: Secondary | ICD-10-CM | POA: Diagnosis present

## 2020-09-06 DIAGNOSIS — I6529 Occlusion and stenosis of unspecified carotid artery: Secondary | ICD-10-CM | POA: Diagnosis present

## 2020-09-06 DIAGNOSIS — K219 Gastro-esophageal reflux disease without esophagitis: Secondary | ICD-10-CM | POA: Diagnosis present

## 2020-09-06 DIAGNOSIS — N1831 Chronic kidney disease, stage 3a: Secondary | ICD-10-CM | POA: Diagnosis not present

## 2020-09-06 DIAGNOSIS — E860 Dehydration: Secondary | ICD-10-CM

## 2020-09-06 DIAGNOSIS — A042 Enteroinvasive Escherichia coli infection: Secondary | ICD-10-CM | POA: Diagnosis present

## 2020-09-06 DIAGNOSIS — A084 Viral intestinal infection, unspecified: Secondary | ICD-10-CM | POA: Diagnosis not present

## 2020-09-06 DIAGNOSIS — Z7982 Long term (current) use of aspirin: Secondary | ICD-10-CM | POA: Diagnosis not present

## 2020-09-06 DIAGNOSIS — A039 Shigellosis, unspecified: Secondary | ICD-10-CM | POA: Diagnosis not present

## 2020-09-06 DIAGNOSIS — Z8249 Family history of ischemic heart disease and other diseases of the circulatory system: Secondary | ICD-10-CM | POA: Diagnosis not present

## 2020-09-06 DIAGNOSIS — A09 Infectious gastroenteritis and colitis, unspecified: Secondary | ICD-10-CM

## 2020-09-06 DIAGNOSIS — I2510 Atherosclerotic heart disease of native coronary artery without angina pectoris: Secondary | ICD-10-CM | POA: Diagnosis not present

## 2020-09-06 DIAGNOSIS — R509 Fever, unspecified: Secondary | ICD-10-CM | POA: Diagnosis not present

## 2020-09-06 DIAGNOSIS — I10 Essential (primary) hypertension: Secondary | ICD-10-CM | POA: Diagnosis not present

## 2020-09-06 DIAGNOSIS — K5180 Other ulcerative colitis without complications: Secondary | ICD-10-CM | POA: Diagnosis not present

## 2020-09-06 LAB — COMPREHENSIVE METABOLIC PANEL
ALT: 25 U/L (ref 0–44)
AST: 25 U/L (ref 15–41)
Albumin: 2.5 g/dL — ABNORMAL LOW (ref 3.5–5.0)
Alkaline Phosphatase: 63 U/L (ref 38–126)
Anion gap: 12 (ref 5–15)
BUN: 31 mg/dL — ABNORMAL HIGH (ref 8–23)
CO2: 25 mmol/L (ref 22–32)
Calcium: 8.4 mg/dL — ABNORMAL LOW (ref 8.9–10.3)
Chloride: 94 mmol/L — ABNORMAL LOW (ref 98–111)
Creatinine, Ser: 1.98 mg/dL — ABNORMAL HIGH (ref 0.61–1.24)
GFR, Estimated: 35 mL/min — ABNORMAL LOW (ref >60)
Glucose, Bld: 133 mg/dL — ABNORMAL HIGH (ref 70–99)
Potassium: 2.6 mmol/L — CL (ref 3.5–5.1)
Sodium: 131 mmol/L — ABNORMAL LOW (ref 135–145)
Total Bilirubin: 0.9 mg/dL (ref 0.3–1.2)
Total Protein: 6.4 g/dL — ABNORMAL LOW (ref 6.5–8.1)

## 2020-09-06 LAB — CBC WITH DIFFERENTIAL/PLATELET
Abs Immature Granulocytes: 0 10*3/uL (ref 0.00–0.07)
Basophils Absolute: 0 10*3/uL (ref 0.0–0.1)
Basophils Relative: 0 %
Eosinophils Absolute: 0.1 10*3/uL (ref 0.0–0.5)
Eosinophils Relative: 1 %
HCT: 38.4 % — ABNORMAL LOW (ref 39.0–52.0)
Hemoglobin: 13.1 g/dL (ref 13.0–17.0)
Lymphocytes Relative: 14 %
Lymphs Abs: 1.4 10*3/uL (ref 0.7–4.0)
MCH: 33.1 pg (ref 26.0–34.0)
MCHC: 34.1 g/dL (ref 30.0–36.0)
MCV: 97 fL (ref 80.0–100.0)
Monocytes Absolute: 1.4 10*3/uL — ABNORMAL HIGH (ref 0.1–1.0)
Monocytes Relative: 14 %
Neutro Abs: 7 10*3/uL (ref 1.7–7.7)
Neutrophils Relative %: 71 %
Platelets: 265 10*3/uL (ref 150–400)
RBC: 3.96 MIL/uL — ABNORMAL LOW (ref 4.22–5.81)
RDW: 13.4 % (ref 11.5–15.5)
WBC: 9.9 10*3/uL (ref 4.0–10.5)
nRBC: 0 % (ref 0.0–0.2)
nRBC: 0 /100 WBC

## 2020-09-06 LAB — BASIC METABOLIC PANEL
Anion gap: 13 (ref 5–15)
BUN: 27 mg/dL — ABNORMAL HIGH (ref 8–23)
CO2: 20 mmol/L — ABNORMAL LOW (ref 22–32)
Calcium: 8 mg/dL — ABNORMAL LOW (ref 8.9–10.3)
Chloride: 101 mmol/L (ref 98–111)
Creatinine, Ser: 1.64 mg/dL — ABNORMAL HIGH (ref 0.61–1.24)
GFR, Estimated: 44 mL/min — ABNORMAL LOW (ref >60)
Glucose, Bld: 99 mg/dL (ref 70–99)
Potassium: 4.3 mmol/L (ref 3.5–5.1)
Sodium: 134 mmol/L — ABNORMAL LOW (ref 135–145)

## 2020-09-06 LAB — C DIFFICILE QUICK SCREEN W PCR REFLEX
C Diff antigen: NEGATIVE
C Diff interpretation: NOT DETECTED
C Diff toxin: NEGATIVE

## 2020-09-06 LAB — LIPASE, BLOOD: Lipase: 18 U/L (ref 11–51)

## 2020-09-06 LAB — SARS CORONAVIRUS 2 (TAT 6-24 HRS): SARS Coronavirus 2: POSITIVE — AB

## 2020-09-06 MED ORDER — CIPROFLOXACIN IN D5W 400 MG/200ML IV SOLN
400.0000 mg | Freq: Two times a day (BID) | INTRAVENOUS | Status: DC
  Administered 2020-09-06: 400 mg via INTRAVENOUS
  Filled 2020-09-06: qty 200

## 2020-09-06 MED ORDER — ABACAVIR-DOLUTEGRAVIR-LAMIVUD 600-50-300 MG PO TABS
1.0000 | ORAL_TABLET | Freq: Every day | ORAL | Status: DC
  Administered 2020-09-07 – 2020-09-09 (×3): 1 via ORAL
  Filled 2020-09-06 (×3): qty 1

## 2020-09-06 MED ORDER — POTASSIUM CHLORIDE 10 MEQ/100ML IV SOLN
10.0000 meq | INTRAVENOUS | Status: AC
  Administered 2020-09-06 (×3): 10 meq via INTRAVENOUS
  Filled 2020-09-06 (×3): qty 100

## 2020-09-06 MED ORDER — CLOPIDOGREL BISULFATE 75 MG PO TABS
75.0000 mg | ORAL_TABLET | Freq: Every day | ORAL | Status: DC
  Administered 2020-09-07 – 2020-09-09 (×3): 75 mg via ORAL
  Filled 2020-09-06 (×3): qty 1

## 2020-09-06 MED ORDER — HEPARIN SODIUM (PORCINE) 5000 UNIT/ML IJ SOLN
5000.0000 [IU] | Freq: Three times a day (TID) | INTRAMUSCULAR | Status: DC
  Administered 2020-09-06 – 2020-09-09 (×8): 5000 [IU] via SUBCUTANEOUS
  Filled 2020-09-06 (×8): qty 1

## 2020-09-06 MED ORDER — IOHEXOL 300 MG/ML  SOLN
80.0000 mL | Freq: Once | INTRAMUSCULAR | Status: AC | PRN
  Administered 2020-09-06: 80 mL via INTRAVENOUS

## 2020-09-06 MED ORDER — SODIUM CHLORIDE 0.9 % IV BOLUS
500.0000 mL | Freq: Once | INTRAVENOUS | Status: AC
  Administered 2020-09-06: 500 mL via INTRAVENOUS

## 2020-09-06 MED ORDER — ACETAMINOPHEN 650 MG RE SUPP: 650.0000 mg | Freq: Four times a day (QID) | RECTAL | Status: DC | PRN

## 2020-09-06 MED ORDER — POTASSIUM CHLORIDE CRYS ER 20 MEQ PO TBCR
40.0000 meq | EXTENDED_RELEASE_TABLET | Freq: Once | ORAL | Status: AC
  Administered 2020-09-06: 40 meq via ORAL
  Filled 2020-09-06: qty 2

## 2020-09-06 MED ORDER — SODIUM CHLORIDE 0.9 % IV SOLN
INTRAVENOUS | Status: DC
Start: 2020-09-06 — End: 2020-09-08

## 2020-09-06 MED ORDER — ACETAMINOPHEN 325 MG PO TABS: 650.0000 mg | ORAL_TABLET | Freq: Four times a day (QID) | ORAL | Status: DC | PRN

## 2020-09-06 MED ORDER — ROSUVASTATIN CALCIUM 20 MG PO TABS
20.0000 mg | ORAL_TABLET | Freq: Every day | ORAL | Status: DC
  Administered 2020-09-06 – 2020-09-09 (×4): 20 mg via ORAL
  Filled 2020-09-06 (×4): qty 1

## 2020-09-06 NOTE — ED Notes (Addendum)
IP status assigned for 30 minutes; states bed is being "cleaned". This RN attempted to call report to floor. Per Network engineer, room just cleaned and nurse requesting a few minutes before accepting report. ED Charge nurse notified of the same and this RN will attempt to call report in approximately 5 minutes.

## 2020-09-06 NOTE — ED Provider Notes (Signed)
[place] EMERGENCY DEPARTMENT Provider Note   CSN: [id number] Arrival date & time: 09/06/20  1120     History Chief Complaint  Patient presents with  . Abdominal Pain    Jeffrey [name] is a 75 y.o. male.  The history is provided by the patient.    Patient is a 75 year old male with h/o of HIV, CAD, CKD, HTN, colon polys, GERD and stroke (on plavix) presenting with diarrhea x one week. He came via ems and was wheeled into the Emergency Department by wheel chair.   Diarrhea started acutely one week ago. He had 3-4 episodes of vomiting the same day it started, but has not since then. He reports 20 episodes of diarrhea daily. States diarrhea is clear as he hasn't eaten since Thursday. States there is also RLQ and LLQ abdominal pain that started after the diarrhea. The pain is sharp, intense and comes and goes. It is exacerbated by touch. He has tried taking ibuprofen for the pain. He denies any nausea, fevers, chills, increased urinary frequency or dysuria. He also denies any blood stool, hematemesis or melena.   He has not had any medication changes, eaten at any new restaurants, been on any antibiotics or had any known sick contacts.   His last colonoscopy was in 2017. He is status post cholecystectomy.     Past Medical History:  Diagnosis Date  . Chronic kidney disease   . Colon polyps   . COVID-19    05/2019  . GERD (gastroesophageal reflux disease)   . Heart murmur   . History of kidney stones   . HIV (human immunodeficiency virus infection) ([place]) 1999  . Hypertension   . Pneumonia   . Stroke [facility])    right side weakness  . TIA (transient ischemic attack)     Patient Active Problem List   Diagnosis Date Noted  . Rib pain 07/13/2020  . Syphilis (acquired) 06/22/2020  . Rectal abnormality 05/27/2020  . Neck pain 12/23/2019  . Other headache syndrome 12/23/2019  . Occipital neuralgia of right side 12/23/2019  . CAD (coronary artery disease)  10/27/2019  . Internal carotid artery stenosis, right 05/14/2019  . Peripheral vertigo 11/29/2018  . Chronic kidney disease 12/20/2017  . Diastolic CHF with preserved left ventricular function, NYHA class 2 ([place]) 01/06/2015  . Exertional dyspnea 09/30/2014  . Insomnia 07/22/2014  . BPH with obstruction/lower urinary tract symptoms 06/17/2014  . Hyperlipidemia 07/22/2012  . Erectile dysfunction 07/22/2012  . GERD 05/17/2010  . Dysphagia 03/08/2009  . CERVICAL LYMPHADENOPATHY 09/19/2007  . CHOLECYSTECTOMY, HX OF 05/28/2006  . Human immunodeficiency virus (HIV) disease ([place]) 02/27/2006  . Essential hypertension 02/27/2006  . PNEUMONIA, HX OF 02/27/2006  . ARTHROSCOPY, KNEE, HX OF 02/27/2006    Past Surgical History:  Procedure Laterality Date  . CHOLECYSTECTOMY  1982  . COLONOSCOPY    . ESOPHAGEAL MANOMETRY N/A 09/03/2017   Procedure: ESOPHAGEAL MANOMETRY (EM);  Surgeon: Mauri Pole, MD;  Location: [facility];  Service: Endoscopy;  Laterality: N/A;  . IR ANGIO INTRA EXTRACRAN SEL COM CAROTID INNOMINATE BILAT MOD SED  04/21/2019  . IR ANGIO INTRA EXTRACRAN SEL COM CAROTID INNOMINATE BILAT MOD SED  05/11/2020  . IR ANGIO VERTEBRAL SEL VERTEBRAL BILAT MOD SED  04/21/2019  . IR ANGIO VERTEBRAL SEL VERTEBRAL BILAT MOD SED  05/11/2020  . IR PTA INTRACRANIAL  05/14/2019  . IR US GUIDE VASC ACCESS RIGHT  05/11/2020  . KNEE SURGERY     LEFT  KNEE  . RADIOLOGY WITH ANESTHESIA N/A 05/14/2019   Procedure: IR WITH ANESTHESIA STENTING;  Surgeon: Luanne Bras, MD;  Location: [facility];  Service: Radiology;  Laterality: N/A;  . TONSILLECTOMY         Family History  Problem Relation Age of Onset  . Hypertension Mother   . Heart attack Mother 59  . Colon cancer Neg Hx   . Esophageal cancer Neg Hx   . Pancreatic cancer Neg Hx   . Stomach cancer Neg Hx   . Liver disease Neg Hx     Social History   Tobacco Use  . Smoking status: Never Smoker  . Smokeless tobacco: Former Systems developer     Types: Chew  . Tobacco comment: Rare use, about q 6 months  Vaping Use  . Vaping Use: Never used  Substance Use Topics  . Alcohol use: Yes    Comment: rare  . Drug use: No    Home Medications Prior to Admission medications   Medication Sig Start Date End Date Taking? Authorizing Provider  abacavir-dolutegravir-lamiVUDine (TRIUMEQ) 600-50-300 MG tablet Take 1 tablet by mouth daily. 03/03/20   [name], MD  amLODipine (NORVASC) 10 MG tablet Take 1 tablet (10 mg total) by mouth daily. Pt needs to keep upcoming appt in March for further refills 05/18/20   Josue Hector, MD  amoxicillin-clavulanate (AUGMENTIN) 875-125 MG tablet Take 1 tablet by mouth 2 (two) times daily. 05/26/20   Gifford Shave, MD  Ascorbic Acid (VITAMIN C) 1000 MG tablet Take 1,000 mg by mouth daily at 6 PM.    [provider]  aspirin EC 81 MG tablet Take 1 tablet (81 mg total) by mouth daily. 10/11/15   Karamalegos, Devonne Doughty, DO  Cholecalciferol (VITAMIN D3) 125 MCG (5000 UT) TABS Take 5,000 Units by mouth daily at 6 PM.    [provider]  clopidogrel (PLAVIX) 75 MG tablet Take 1 tablet by mouth once daily 07/06/20   Josue Hector, MD  finasteride (PROSCAR) 5 MG tablet Take 5 mg by mouth daily.    [provider]  pantoprazole (PROTONIX) 20 MG tablet Take 1 tablet (20 mg total) by mouth 2 (two) times daily. 06/11/20   Armbruster, Carlota Raspberry, MD  rosuvastatin (CRESTOR) 20 MG tablet Take 1 tablet (20 mg total) by mouth daily. Patient taking differently: Take 20 mg by mouth daily at 6 PM. 10/27/19   Josue Hector, MD  valACYclovir (VALTREX) 1000 MG tablet Take 1 tablet (1,000 mg total) by mouth 2 (two) times daily. 05/26/20   Gifford Shave, MD  Zinc 50 MG TABS Take 50 mg by mouth daily at 6 PM.    [provider]    Allergies    Baclofen, Efavirenz, and Nevirapine  Review of Systems   Review of Systems  Constitutional: Positive for appetite change and fatigue. Negative for  chills and fever.  HENT: Negative for ear pain and sore throat.   Eyes: Negative for pain and visual disturbance.  Respiratory: Negative for cough and shortness of breath.   Cardiovascular: Negative for chest pain and palpitations.  Gastrointestinal: Positive for abdominal pain, diarrhea and vomiting. Negative for anal bleeding, blood in stool and constipation.  Genitourinary: Negative for dysuria and hematuria.  Musculoskeletal: Negative for arthralgias and back pain.  Skin: Negative for color change and rash.  Neurological: Negative for seizures and syncope.  All other systems reviewed and are negative.   Physical Exam Updated Vital Signs BP 119/79 (BP Location: Right  Arm)   Pulse 90   Temp 97.8 F (36.6 C) (Oral)   Resp 17   SpO2 96%   Physical Exam Vitals and nursing note reviewed. Exam conducted with a chaperone present.  Constitutional:      Appearance: Normal appearance.     Comments: Patient dry.   HENT:     Head: Normocephalic and atraumatic.  Eyes:     General: No scleral icterus.       Right eye: No discharge.        Left eye: No discharge.     Extraocular Movements: Extraocular movements intact.     Pupils: Pupils are equal, round, and reactive to light.  Cardiovascular:     Rate and Rhythm: Normal rate and regular rhythm.     Pulses: Normal pulses.     Heart sounds: Murmur heard.  No friction rub. No gallop.   Pulmonary:     Effort: Pulmonary effort is normal. No respiratory distress.     Breath sounds: Normal breath sounds.  Abdominal:     General: Abdomen is flat. A surgical scar is present. Bowel sounds are increased. There is no distension.     Palpations: Abdomen is soft.     Tenderness: There is abdominal tenderness in the right lower quadrant and left lower quadrant. There is no guarding or rebound.     Hernia: A hernia is present. Hernia is present in the umbilical area.     Comments: Surgical scar appreciated from open cholecystectomy   Skin:     General: Skin is warm and dry.     Coloration: Skin is not jaundiced.  Neurological:     Mental Status: He is alert. Mental status is at baseline.     Coordination: Coordination normal.     ED Results / Procedures / Treatments   Labs (all labs ordered are listed, but only abnormal results are displayed) Labs Reviewed  CBC WITH DIFFERENTIAL/PLATELET  COMPREHENSIVE METABOLIC PANEL  LIPASE, BLOOD  URINALYSIS, ROUTINE W REFLEX MICROSCOPIC    EKG None  Radiology No results found.  Procedures Procedures   Medications Ordered in ED Medications  sodium chloride 0.9 % bolus 500 mL (has no administration in time range)    ED Course  I have reviewed the triage vital signs and the nursing notes.  Pertinent labs & imaging results that were available during my care of the patient were reviewed by me and considered in my medical decision making (see chart for details).  Clinical Course as of 09/06/20 1255  Mon Sep 06, 2020  1158 Hepatic function panel [HS]    Clinical Course User Index [HS] Sherrill Raring, [place]   MDM Rules/Calculators/A&P                          Patient is a 75 year old male with history of HIV, CKD, status post cholecystectomy, HTN, colon polyps, GERD and stroke (on plavix). He appears nontoxic. Tachycardic to 103. Patient is dry. Heart murmur appreciated. TTP to LLQ and RLQ.   Labs ordered:  CBC: no anemia, no elevated WBC.   CMP: Hypokalemic to 2.6. creatinine and bun elevated but not significantly more than his baseline.   Lipase: WNL  UA: pending   C. Diff: pending  Imagining:   Abdominal CT needed given very TTP in LLQ and RLQ. Findings show changes consistent with diffuse colitis worse in the left colon and rectosigmoid without absences or perforation.   EKG:  pending   ED Course:  Patient is clearly dehydrated but has CKD so starting with only 500 mL sodium chloride bolus. Given second bolus of 500 ml at 125 ml/hr.  Hypokalemic - patient given 3  runs of 10 meq potasium and 40 mg K-dur.   A/P: -Hypokalemia: most likely due to severe amounts of diarrhea. Started replenishing potassium level in ED. -Infectious colitis: plan to admit for further management. Holding off on starting antibiotic until c.diff screen results unless directed otherwise by hospitalist.  -Family medicine resident paged.   Discussed HPI, physical exam and plan of care for this patient with attending [facility]. The attending physician evaluated this patient as part of a shared visit and agrees with plan of care.    Final Clinical Impression(s) / ED Diagnoses Final diagnoses:  None    Rx / DC Orders ED Discharge Orders    None       Sherrill Raring, PA-C 09/06/20 1524    Blanchie Dessert, MD 09/10/20 [phone]

## 2020-09-06 NOTE — H&P (Addendum)
[facility] Admission History and Physical Service Pager: [phone]  Patient name: Jeffrey [name] Medical record number: [id number] Date of birth: 23-Mar-1946 Age: 75 y.o. Gender: male  Primary Care Provider: Danna Hefty, DO Consultants: None Code Status: Full  Preferred Emergency Contact: Juanda Crumble (son)   Chief Complaint: Diarrhea   Assessment and Plan: [name] is a 75 y.o. male presenting with diarrhea x 1 week. PMH is significant for R ICA stenosis, HTN,HFpEF (07/24/19), CAD, GERD, dysphagia, peripheral vertigo, cervical lymphadenopathy, CKD, BPH, syphilis, pneumonia, occipital neuralgia,HLD, HIV, COVID, colon polyps, stroke, TIA.  Infectious Colitis Patient presenting with watery diarrhea, 15-20 episodes a day for the past week. On presentation, He appeared volume depleted with dry mucous membranes and was tachycardic to 103. Patient afebrile and without a white count. On physical exam patient tender to palpation in lower right and left quadrants. Labs significant for K+ of 2.6, Na 131, Cr 1.98  In ED received 3 runs of 10 mg potassium and 40 mg K-dur. Received 575m bolus x2.  C.diff  negative. CT abdomen consistent with diffuse colitis worst in the left colon and rectosigmoid without evidence of abscess or perforation. Patient COVID-19 positive which could be contributing to his diarrhea in addition to the infectious colitis. Admitting patient for IV fluids and potentially antibiotics.  - admit [facility] Dr. CErin Hearing- Start Cipro 400 mg IV  - vitals per floor - PT/OT consults - NS 150 ml/h  - am BMP, CBC - Tylenol 650 mg prn for pain   Hypokalemia 2/2 diarrhea K+ on admission 2.6 likely in the setting of severe diarrhea.  - Repeat BMP at 2000 - continue to monitor and replete as needed   Acute on Chronic Kidney Disease  Cr. 1.98 > 1.64. Baseline appears to be around 1.5.  - am BMP  - avoid nephrotoxic agents  -  continue IV hydration, switch to oral hydration when able to tolerate  Hx CVA  TIA Currently taking Plavix 752mdaily.  No residual deficits. Normal neuro exam. - continue plavix   HTN Blood pressure normotensive at 122/69 Home medication: Amlodipine 10 mg daily - Hold home medication - Monitor blood pressure  GERD Follows with [facility] Home medication: Protonix 20 mg twice daily - hold home med   HFpEF  Last echo on 07/24/19 with EF of 60-65% and with normal function and no wall abnormalities - strict Is &Os - daily weights   CAD, HLD Known CAD with carotid artery stenosis. LDL 113 in 2018 Home medication: Crestor 20 mg daily - consider rechecking lipid panel   HIV HIV viral load undetectable 1 year ago Home medication: abacavir-dolutegravir-lamiVUDine  - continue home med  - repeat viral load in am - consider ID consult in am  FEN/GI: NPO Prophylaxis: Heparin  Disposition: Med-tele   History of Present Illness:  ChHOWARD BUNTEs a [age].o. male presenting with 1 week of diarrhea.  Loose bowel movements- reports about 15-20 times a day since Thursday. States he went through a lot of diapers. Diarrhea got better tues/wed after taking pepto bismal and then worsened. Tried Imodium without relief. States he has seen a little bit of blood in the stools, states it is bright red. Endorses lower abdominal pain several nights ago, relieved by ibuprofen  States he lives alone, does not eat out. Denies eating any new food, or recent travel.   Denies N/V, headache, throat pain. States one night had chills,  denies fever, night sweats. Endorses less urination yesterday and today. No blood in urine. Denies chest pain, endorses SOB since COVID   Took BPH medication yesterday but regurgitated back up. Denies history of reflux   Denies tobacco use, recreational drug use, alcohol use  ED course: Patient presented tachycardic to 103, found to be hypokalemic to 2.6.  CT abdomen shows  changes consistent with diffuse colitis worse in the left colon and rectosigmoid without abscess or perforation.  Patient has CKD started on 500 mL sodium chloride bolus, second bolus of 500 mL at 125 mL/h.  Received 3 runs of 10 mg potassium and 40 mg K-dur.   Review Of Systems: Per HPI with the following additions:  Review of Systems  Constitutional: Positive for appetite change and chills.  HENT: Positive for hearing loss.   Eyes: Negative.   Respiratory: Negative.   Cardiovascular: Negative.   Gastrointestinal: Positive for abdominal pain, blood in stool, diarrhea and vomiting.  Endocrine: Negative.   Genitourinary: Positive for difficulty urinating.  Musculoskeletal: Negative.   Skin: Negative.   Neurological: Negative.   Hematological: Bruises/bleeds easily.  Psychiatric/Behavioral: Negative.      Patient Active Problem List   Diagnosis Date Noted  . Diarrhea 09/06/2020  . Rib pain 07/13/2020  . Syphilis (acquired) 06/22/2020  . Rectal abnormality 05/27/2020  . Neck pain 12/23/2019  . Other headache syndrome 12/23/2019  . Occipital neuralgia of right side 12/23/2019  . CAD (coronary artery disease) 10/27/2019  . Internal carotid artery stenosis, right 05/14/2019  . Peripheral vertigo 11/29/2018  . Chronic kidney disease 12/20/2017  . Diastolic CHF with preserved left ventricular function, NYHA class 2 ([place]) 01/06/2015  . Exertional dyspnea 09/30/2014  . Insomnia 07/22/2014  . BPH with obstruction/lower urinary tract symptoms 06/17/2014  . Hyperlipidemia 07/22/2012  . Erectile dysfunction 07/22/2012  . GERD 05/17/2010  . Dysphagia 03/08/2009  . CERVICAL LYMPHADENOPATHY 09/19/2007  . CHOLECYSTECTOMY, HX OF 05/28/2006  . Human immunodeficiency virus (HIV) disease ([place]) 02/27/2006  . Essential hypertension 02/27/2006  . PNEUMONIA, HX OF 02/27/2006  . ARTHROSCOPY, KNEE, HX OF 02/27/2006    Past Medical History: Past Medical History:  Diagnosis Date  . Chronic  kidney disease   . Colon polyps   . COVID-19    05/2019  . GERD (gastroesophageal reflux disease)   . Heart murmur   . History of kidney stones   . HIV (human immunodeficiency virus infection) ([place]) 1999  . Hypertension   . Pneumonia   . Stroke [facility])    right side weakness  . TIA (transient ischemic attack)     Past Surgical History: Past Surgical History:  Procedure Laterality Date  . CHOLECYSTECTOMY  1982  . COLONOSCOPY    . ESOPHAGEAL MANOMETRY N/A 09/03/2017   Procedure: ESOPHAGEAL MANOMETRY (EM);  Surgeon: Mauri Pole, MD;  Location: [facility];  Service: Endoscopy;  Laterality: N/A;  . IR ANGIO INTRA EXTRACRAN SEL COM CAROTID INNOMINATE BILAT MOD SED  04/21/2019  . IR ANGIO INTRA EXTRACRAN SEL COM CAROTID INNOMINATE BILAT MOD SED  05/11/2020  . IR ANGIO VERTEBRAL SEL VERTEBRAL BILAT MOD SED  04/21/2019  . IR ANGIO VERTEBRAL SEL VERTEBRAL BILAT MOD SED  05/11/2020  . IR PTA INTRACRANIAL  05/14/2019  . IR US GUIDE VASC ACCESS RIGHT  05/11/2020  . KNEE SURGERY     LEFT KNEE  . RADIOLOGY WITH ANESTHESIA N/A 05/14/2019   Procedure: IR WITH ANESTHESIA STENTING;  Surgeon: Luanne Bras, MD;  Location: [facility];  Service:  Radiology;  Laterality: N/A;  . TONSILLECTOMY      Social History: Social History   Tobacco Use  . Smoking status: Never Smoker  . Smokeless tobacco: Former Systems developer    Types: Chew  . Tobacco comment: Rare use, about q 6 months  Vaping Use  . Vaping Use: Never used  Substance Use Topics  . Alcohol use: Yes    Comment: rare  . Drug use: No   Please also refer to relevant sections of EMR.  Family History: Family History  Problem Relation Age of Onset  . Hypertension Mother   . Heart attack Mother 83  . Colon cancer Neg Hx   . Esophageal cancer Neg Hx   . Pancreatic cancer Neg Hx   . Stomach cancer Neg Hx   . Liver disease Neg Hx     Allergies and Medications: Allergies  Allergen Reactions  . Baclofen Other (See Comments)    Back pain.  Patient said it made him feel crazy   . Efavirenz Rash  . Nevirapine Rash and Other (See Comments)   No current facility-administered medications on file prior to encounter.   Current Outpatient Medications on File Prior to Encounter  Medication Sig Dispense Refill  . abacavir-dolutegravir-lamiVUDine (TRIUMEQ) 600-50-300 MG tablet Take 1 tablet by mouth daily. 30 tablet 11  . amLODipine (NORVASC) 10 MG tablet Take 1 tablet (10 mg total) by mouth daily. Pt needs to keep upcoming appt in March for further refills 60 tablet 0  . Ascorbic Acid (VITAMIN C) 1000 MG tablet Take 1,000 mg by mouth daily at 6 PM.    . aspirin EC 81 MG tablet Take 1 tablet (81 mg total) by mouth daily.    . Cholecalciferol (VITAMIN D3) 125 MCG (5000 UT) TABS Take 5,000 Units by mouth daily at 6 PM.    . clopidogrel (PLAVIX) 75 MG tablet Take 1 tablet by mouth once daily 90 tablet 0  . finasteride (PROSCAR) 5 MG tablet Take 5 mg by mouth daily.    . pantoprazole (PROTONIX) 20 MG tablet Take 1 tablet (20 mg total) by mouth 2 (two) times daily. (Patient taking differently: Take 20 mg by mouth daily.) 180 tablet 3  . rosuvastatin (CRESTOR) 20 MG tablet Take 1 tablet (20 mg total) by mouth daily. (Patient taking differently: Take 20 mg by mouth daily at 6 PM.) 90 tablet 3  . Zinc 50 MG TABS Take 50 mg by mouth daily at 6 PM.      Objective: BP 122/69 (BP Location: Left Arm)   Pulse 99   Temp 98 F (36.7 C) (Oral)   Resp 16   Ht 5' 10"  (1.778 m)   Wt 70.4 kg   SpO2 99%   BMI 22.27 kg/m  Exam: General: alert, NAD Eyes: EOMI. Normal conjunctiva  ENTM: dry MM. Tongue with white plaques  Neck: supple, normal ROM Cardiovascular: RRR. Murmur appreciated  Respiratory: CTAB normal WOB Gastrointestinal: soft, non distended, tender to palpation in left and right lower quadrants.  Bowels sounds present MSK: normal tone and strength Derm: warm, dry no visible lesions Neuro: no focal deficits. Sensation in tact. Normal  strength  Psych: mood and affect appropriate   Labs and Imaging: CBC BMET  Recent Labs  Lab 09/06/20 1150  WBC 9.9  HGB 13.1  HCT 38.4*  PLT 265   Recent Labs  Lab 09/06/20 1150  NA 131*  K 2.6*  CL 94*  CO2 25  BUN 31*  CREATININE 1.98*  GLUCOSE 133*  CALCIUM 8.4*     EKG: Normal ventricular rate 91 BPM, QTC 463 ms, normal sinus rhythm, no ST segment elevation seen  CT ABDOMEN PELVIS W CONTRAST  Result Date: 09/06/2020 CLINICAL DATA:  Acute abdominal pain for several days EXAM: CT ABDOMEN AND PELVIS WITH CONTRAST TECHNIQUE: Multidetector CT imaging of the abdomen and pelvis was performed using the standard protocol following bolus administration of intravenous contrast. CONTRAST:  45m OMNIPAQUE IOHEXOL 300 MG/ML  SOLN COMPARISON:  09/06/2017 FINDINGS: Lower chest: Minimal scarring is noted in the bases bilaterally. Hepatobiliary: Gallbladder has been surgically removed. Mild prominence of the biliary tree is noted due to the post cholecystectomy state. The liver is well visualized and within normal limits. Pancreas: Unremarkable. No pancreatic ductal dilatation or surrounding inflammatory changes. Spleen: Normal in size without focal abnormality. Adrenals/Urinary Tract: Adrenal glands are within normal limits. Kidneys demonstrate a normal enhancement pattern. No renal calculi or obstructive changes are seen. Normal excretion is noted on delayed images. Bladder is partially distended. Stomach/Bowel: Wall thickening and edema is noted within the rectosigmoid and to a lesser degree throughout the remainder of the colon. These changes are consistent with diffuse colitis and are new from the prior exam. No perforation or abscess formation is noted. The appendix is within normal limits. Small bowel and stomach are unremarkable. Vascular/Lymphatic: No significant vascular findings are present. No enlarged abdominal or pelvic lymph nodes. Reproductive: Prostate is unremarkable. Other: Fat  containing left inguinal hernia is noted. Musculoskeletal: Degenerative changes of lumbar spine are noted. IMPRESSION: Changes consistent with diffuse colitis worst in the left colon and rectosigmoid without evidence of abscess or perforation. This corresponds with the given clinical history of diarrhea and likely represents an infectious colitis. Status post cholecystectomy. Electronically Signed   By: MInez CatalinaM.D.   On: 09/06/2020 14:22    PShary Key DO 09/06/2020, 10:10 PM PGY-1, [place]ottlevilleIntern pager: [phone] text pages welcome   FPTS Upper-Level Resident Addendum   I have independently interviewed and examined the patient. I have discussed the above with the original author and agree with their documentation.  Please see also any attending notes.    TCarollee LeitzMD PGY-2, CBurlingtonFamily Medicine 09/06/2020 10:54 PM  FPTS Service pager: [phone](text pages welcome through [facility]

## 2020-09-06 NOTE — ED Triage Notes (Signed)
Per EMS: Diarrhea x1 week, feels dry. On Plavix.

## 2020-09-07 DIAGNOSIS — Z79899 Other long term (current) drug therapy: Secondary | ICD-10-CM | POA: Diagnosis not present

## 2020-09-07 DIAGNOSIS — I6529 Occlusion and stenosis of unspecified carotid artery: Secondary | ICD-10-CM | POA: Diagnosis present

## 2020-09-07 DIAGNOSIS — R197 Diarrhea, unspecified: Secondary | ICD-10-CM | POA: Diagnosis present

## 2020-09-07 DIAGNOSIS — N400 Enlarged prostate without lower urinary tract symptoms: Secondary | ICD-10-CM | POA: Diagnosis present

## 2020-09-07 DIAGNOSIS — Z8249 Family history of ischemic heart disease and other diseases of the circulatory system: Secondary | ICD-10-CM | POA: Diagnosis not present

## 2020-09-07 DIAGNOSIS — K219 Gastro-esophageal reflux disease without esophagitis: Secondary | ICD-10-CM | POA: Diagnosis present

## 2020-09-07 DIAGNOSIS — A09 Infectious gastroenteritis and colitis, unspecified: Secondary | ICD-10-CM | POA: Diagnosis not present

## 2020-09-07 DIAGNOSIS — I130 Hypertensive heart and chronic kidney disease with heart failure and stage 1 through stage 4 chronic kidney disease, or unspecified chronic kidney disease: Secondary | ICD-10-CM | POA: Diagnosis present

## 2020-09-07 DIAGNOSIS — B20 Human immunodeficiency virus [HIV] disease: Secondary | ICD-10-CM | POA: Diagnosis present

## 2020-09-07 DIAGNOSIS — E876 Hypokalemia: Secondary | ICD-10-CM | POA: Diagnosis present

## 2020-09-07 DIAGNOSIS — U071 COVID-19: Secondary | ICD-10-CM | POA: Diagnosis present

## 2020-09-07 DIAGNOSIS — N1831 Chronic kidney disease, stage 3a: Secondary | ICD-10-CM | POA: Diagnosis present

## 2020-09-07 DIAGNOSIS — I5032 Chronic diastolic (congestive) heart failure: Secondary | ICD-10-CM | POA: Diagnosis present

## 2020-09-07 DIAGNOSIS — Z8673 Personal history of transient ischemic attack (TIA), and cerebral infarction without residual deficits: Secondary | ICD-10-CM | POA: Diagnosis not present

## 2020-09-07 DIAGNOSIS — A039 Shigellosis, unspecified: Secondary | ICD-10-CM | POA: Diagnosis present

## 2020-09-07 DIAGNOSIS — A042 Enteroinvasive Escherichia coli infection: Secondary | ICD-10-CM | POA: Diagnosis present

## 2020-09-07 DIAGNOSIS — Z888 Allergy status to other drugs, medicaments and biological substances status: Secondary | ICD-10-CM | POA: Diagnosis not present

## 2020-09-07 DIAGNOSIS — K5180 Other ulcerative colitis without complications: Secondary | ICD-10-CM | POA: Diagnosis not present

## 2020-09-07 DIAGNOSIS — Z7902 Long term (current) use of antithrombotics/antiplatelets: Secondary | ICD-10-CM | POA: Diagnosis not present

## 2020-09-07 DIAGNOSIS — Z7982 Long term (current) use of aspirin: Secondary | ICD-10-CM | POA: Diagnosis not present

## 2020-09-07 DIAGNOSIS — I2510 Atherosclerotic heart disease of native coronary artery without angina pectoris: Secondary | ICD-10-CM | POA: Diagnosis present

## 2020-09-07 DIAGNOSIS — E785 Hyperlipidemia, unspecified: Secondary | ICD-10-CM | POA: Diagnosis present

## 2020-09-07 DIAGNOSIS — Z9049 Acquired absence of other specified parts of digestive tract: Secondary | ICD-10-CM | POA: Diagnosis not present

## 2020-09-07 DIAGNOSIS — E860 Dehydration: Secondary | ICD-10-CM | POA: Diagnosis not present

## 2020-09-07 LAB — BASIC METABOLIC PANEL
Anion gap: 9 (ref 5–15)
BUN: 23 mg/dL (ref 8–23)
CO2: 20 mmol/L — ABNORMAL LOW (ref 22–32)
Calcium: 7.6 mg/dL — ABNORMAL LOW (ref 8.9–10.3)
Chloride: 100 mmol/L (ref 98–111)
Creatinine, Ser: 1.56 mg/dL — ABNORMAL HIGH (ref 0.61–1.24)
GFR, Estimated: 47 mL/min — ABNORMAL LOW (ref >60)
Glucose, Bld: 99 mg/dL (ref 70–99)
Potassium: 3.1 mmol/L — ABNORMAL LOW (ref 3.5–5.1)
Sodium: 129 mmol/L — ABNORMAL LOW (ref 135–145)

## 2020-09-07 LAB — RESP PANEL BY RT-PCR (FLU A&B, COVID) ARPGX2
Influenza A by PCR: NEGATIVE
Influenza B by PCR: NEGATIVE
SARS Coronavirus 2 by RT PCR: POSITIVE — AB

## 2020-09-07 LAB — CD4/CD8 (T-HELPER/T-SUPPRESSOR CELL)
CD4 absolute: 354 /uL — ABNORMAL LOW (ref 400–1790)
CD4%: 31 % — ABNORMAL LOW (ref 33–65)
CD8 T Cell Abs: 454 /uL (ref 190–1000)
CD8tox: 39 % (ref 12–40)
Ratio: 0.78 — ABNORMAL LOW (ref 1.0–3.0)
Total lymphocyte count: 1150 /uL (ref 1000–4000)

## 2020-09-07 LAB — CBC
HCT: 32.6 % — ABNORMAL LOW (ref 39.0–52.0)
Hemoglobin: 11.3 g/dL — ABNORMAL LOW (ref 13.0–17.0)
MCH: 33.2 pg (ref 26.0–34.0)
MCHC: 34.7 g/dL (ref 30.0–36.0)
MCV: 95.9 fL (ref 80.0–100.0)
Platelets: 231 10*3/uL (ref 150–400)
RBC: 3.4 MIL/uL — ABNORMAL LOW (ref 4.22–5.81)
RDW: 13.5 % (ref 11.5–15.5)
WBC: 7.5 10*3/uL (ref 4.0–10.5)
nRBC: 0 % (ref 0.0–0.2)

## 2020-09-07 LAB — MAGNESIUM: Magnesium: 2.2 mg/dL (ref 1.7–2.4)

## 2020-09-07 MED ORDER — POTASSIUM CHLORIDE CRYS ER 20 MEQ PO TBCR
40.0000 meq | EXTENDED_RELEASE_TABLET | Freq: Once | ORAL | Status: AC
  Administered 2020-09-07: 40 meq via ORAL
  Filled 2020-09-07: qty 2

## 2020-09-07 MED ORDER — METRONIDAZOLE 500 MG PO TABS
500.0000 mg | ORAL_TABLET | Freq: Three times a day (TID) | ORAL | Status: DC
  Administered 2020-09-07 – 2020-09-08 (×3): 500 mg via ORAL
  Filled 2020-09-07 (×3): qty 1

## 2020-09-07 MED ORDER — PANTOPRAZOLE SODIUM 20 MG PO TBEC
20.0000 mg | DELAYED_RELEASE_TABLET | Freq: Every day | ORAL | Status: DC
Start: 2020-09-07 — End: 2020-09-09
  Administered 2020-09-07 – 2020-09-09 (×3): 20 mg via ORAL
  Filled 2020-09-07 (×3): qty 1

## 2020-09-07 MED ORDER — SODIUM CHLORIDE 0.9 % IV SOLN
2.0000 g | INTRAVENOUS | Status: DC
  Administered 2020-09-07: 2 g via INTRAVENOUS
  Filled 2020-09-07: qty 2

## 2020-09-07 NOTE — Progress Notes (Signed)
Report given to RN on 5W.

## 2020-09-07 NOTE — Evaluation (Signed)
Physical Therapy Evaluation Patient Details Name: Jeffrey [name] MRN: [id number] DOB: 1946/05/06 Today's Date: 09/07/2020   History of Present Illness  Pt is a 75 yo male admitted with diarrhea for over one week.  CDIFF -.  Pt covid + in Jan and is Covid + on this admit (just reported during therapy session).  Pt with h/o HIV, TIA, CVA, R ICA stenosis, CAD, GERD, peripheral vertigo, CKD, syphlis, pneumonia, occipital neuralgia.  Clinical Impression  Pt presents to PT with slightly unsteady gait due to illness and inactivity. Expect pt will make good progress back to baseline with mobility. Will follow acutely but pt will not need PT after DC.      Follow Up Recommendations No PT follow up    Equipment Recommendations  None recommended by PT    Recommendations for Other Services       Precautions / Restrictions Precautions Precautions: Other (comment) Precaution Comments: covid precautions Restrictions Weight Bearing Restrictions: No      Mobility  Bed Mobility Overal bed mobility: Independent                  Transfers Overall transfer level: Needs assistance Equipment used: None Transfers: Sit to/from Stand;Stand Pivot Transfers Sit to Stand: Supervision         General transfer comment: supervision for safety. Pt tentative due to weakness  Ambulation/Gait Ambulation/Gait assistance: Supervision Gait Distance (Feet): 125 Feet Assistive device: None Gait Pattern/deviations: Step-through pattern;Decreased stride length Gait velocity: decr Gait velocity interpretation: 1.31 - 2.62 ft/sec, indicative of limited community ambulator (limited due to small room size) General Gait Details: Pt with slightly unsteady gait but no overt loss of balance. Due to covid amb limited to back and forth in room  Stairs            Wheelchair Mobility    Modified Rankin (Stroke Patients Only)       Balance Overall balance assessment: Mild deficits observed, not  formally tested                                           Pertinent Vitals/Pain Pain Assessment: No/denies pain    Home Living Family/patient expects to be discharged to:: Private residence Living Arrangements: Alone Available Help at Discharge: Family Type of Home: House Home Access: Level entry     Home Layout: One level Home Equipment: Environmental consultant - 2 wheels;Cane - single point      Prior Function Level of Independence: Independent         Comments: Pt fully independent. Does not use assistive devices.  Drives and cares for self.     Hand Dominance   Dominant Hand: Right    Extremity/Trunk Assessment   Upper Extremity Assessment Upper Extremity Assessment: Defer to OT evaluation    Lower Extremity Assessment Lower Extremity Assessment: Generalized weakness    Cervical / Trunk Assessment Cervical / Trunk Assessment: Normal  Communication   Communication: No difficulties  Cognition Arousal/Alertness: Awake/alert Behavior During Therapy: WFL for tasks assessed/performed Overall Cognitive Status: Within Functional Limits for tasks assessed                                        General Comments      Exercises     Assessment/Plan  PT Assessment Patient needs continued PT services  PT Problem List Decreased strength;Decreased balance;Decreased mobility;Decreased activity tolerance       PT Treatment Interventions Gait training;Functional mobility training;Therapeutic activities;Therapeutic exercise;Patient/family education;Balance training    PT Goals (Current goals can be found in the Care Plan section)  Acute Rehab PT Goals Patient Stated Goal: to get better PT Goal Formulation: With patient Time For Goal Achievement: 09/10/20 Potential to Achieve Goals: Good    Frequency Min 3X/week   Barriers to discharge Decreased caregiver support lives alone    Co-evaluation               AM-PAC PT "6 Clicks"  Mobility  Outcome Measure Help needed turning from your back to your side while in a flat bed without using bedrails?: None Help needed moving from lying on your back to sitting on the side of a flat bed without using bedrails?: None Help needed moving to and from a bed to a chair (including a wheelchair)?: None Help needed standing up from a chair using your arms (e.g., wheelchair or bedside chair)?: None Help needed to walk in hospital room?: A Little Help needed climbing 3-5 steps with a railing? : A Little 6 Click Score: 22    End of Session   Activity Tolerance: Patient tolerated treatment well Patient left: in bed;with call bell/phone within reach   PT Visit Diagnosis: Unsteadiness on feet (R26.81);Muscle weakness (generalized) (M62.81)    Time: 1031-1050 PT Time Calculation (min) (ACUTE ONLY): 19 min   Charges:   PT Evaluation $PT Eval Low Complexity: [place] [facility] Pager [phone] Office [place] 09/07/2020, 11:12 AM

## 2020-09-07 NOTE — Hospital Course (Addendum)
Infectious Colitis 2/2 Shigella COVID-19 + on 09/06/20 Patient presented with watery diarrhea, 15-20 episodes a day * 1 week. On presentation he appeared volume depleted with dry mucous membranes and was tachycardic to 103. Patient afebrile and without an elevated white count. On physical exam patient was tender to palpation in lower right and left quadrants. Labs significant for K+ of 2.6, Na 131, Cr 1.98  In ED received 3 runs of 10 mg potassium and 40 mg K-dur. Received 535m bolus x2.  C.diff  negative. CT abdomen consistent with diffuse colitis worst in the left colon and rectosigmoid without evidence of abscess or perforation. Patient COVID-19 positive which could be contributing to his diarrhea in addition to the infectious colitis.  ID did not think the diarrhea is HIV related given acute onset, and well-controlled HIV and a compliant patient, possibly COVID-related vs infectious colitis 2/2 Shigella. Received ciprofloxacin 1 dose but discontinued by ID started on ceftriaxone and metronidazole.  When patient tested positive for Shigella, changed to oral levofloxacin.    Hypokalemia 2/2 diarrhea-Resolved K+ on admission 2.6 likely in the setting of severe diarrhea. In ED received 3 runs of 10 mg potassium and 40 mg K-dur.  Received KCL 40 mg as needed   Acute on Chronic Kidney Disease  On presentation Cr. 1.98 > 1.64. Baseline appears to be around 1.5.  It trended down and on the day of discharge 1.33   Hx CVA  TIA Patient taking  Plavix 750mdaily.  No residual deficits. Normal neuro exam.   HTN Blood pressure normotensive at 122/69 Home medication: Amlodipine 10 mg daily    GERD Follows with [facility] Home medication: Protonix 20 mg twice daily Restarted on Protonix 20 mg once daily    CAD, HLD Known CAD with carotid artery stenosis. LDL 113 in 2018 Home medication: Crestor 20 mg daily    HIV HIV viral load undetectable 1 year ago.  Home medication:  abacavir-dolutegravir-lamivudine. [facility], was 469 on 10/21. HIV 1 RNA Quant undetectable on 09/07/20 and was continued on Triumeq

## 2020-09-07 NOTE — Consult Note (Signed)
[facility]                                                                                        Patient Identification: Patient Name: Jeffrey [name] MRN: [id number] [place] Date: 09/06/2020 11:20 AM Today's Date: 09/07/2020 Reason for consult: Diarrhea  Requesting provider: Talbert Cage   Active Problems:   Diarrhea   Antibiotics: ciprofloxacin 5/2   Lines/Tubes: PIVs   Assessment Acute Diarrhea. CT abd/pelvis with diffuse colitis  C diff negative  ? COVID related given positive covid test.  Will repeat SARSCoV2 RT PCT for CT values for possible re- infection  SARS CoV2 Infection -Tested positive in 05/30/19, was seen in the ED, does not seem to have received any form of COVID specific tx and was discharged from ED with COVID remote health set up.  S/p 2 doses of COVID vaccines following infection. Repeat SARScov2 5/2/2 positive. He does not have any respiratory symptoms and is on room air currently.  -CT chest 08/05/19:  Mild chronic interstitial changes identified within the lungs including cylindrical bronchiectasis and peripheral interstitial reticulation.  -No prior CT chest to compare   HIV, well controlled  - on triumeq. Follows Dr Megan Salon at [facility]   Note - do not think the diarrhea is HIV related OI currently given acute onset, well controlled HIV in a compliant patient. ? COVID related vs infectious colitis. Will hold off on aggressive ID related causes of diarrhea given somewhat improving frequency and consistency and well controlled HIV.    Recommendations  DC ciprofloxacin, will start ceftriaxone and metronidazole for now GI PCR panel  Repeat SARcov2/Influenza A and B panel for CT values ( Per micro they are not able to ruin CT values in the prior SARCOV2 test on 09/06/20) for possible re- infection  Monitor CBC and BMP Hydration  Continue Trimueq as is Fu HIV VL and  CD4 COVID 10 isolation precautions per IP  Following   Rest of the management as per the primary team. Please call with questions or concerns.  Thank you for the consult  Rosiland Oz, MD Infectious Disease Physician [facility] [address] [place], [place] [address] Phone: [phone]  Fax: [phone]  __________________________________________________________________________________________________________ HPI and Hospital Course: 75 Y O male with a PMH of HIV ( well controlled, last CD4 25%, 469 02/10/20 and HIV RNA <20 02/10/2020) who presented to the ED yesterday with complaints of diarrhea for a week. Per patient, he started having nausea and few episodes of NBNB vomiting last Tuesday which was followed by multiple episodes of watery stool almost 15-20 times in a day. One time he also saw small amount of blood in stool. No h/o hemorrhoids. Also has soreness in the lower abdomen. Frequency of diarrhea has been decreasing around 8-1o times currently with stool being less watery and more formed. Denies any fevers and sweats. Had one episode of chils. Nausea and vomiting resolved spontaneously. He took imodium to help with the diarrhea but it did not help. Appetite is OK. No recent weight changes. No recent travel, outside food, sick  contacts, covid exposure. No pets at home. Lives by himself. Denies smoking, alcohol and drugs.  In terms of HIV, he had HIV many years ago through a heterosexual encounter and has been well controlled  since 2014. Takes Trimueq consistently without missing any doses. He follows with Dr Megan Salon at [facility]. Patient treated with latent syphilis in 06/2020 with 2.4 million units IM B penicillin.   Colonoscopy in 2-17 -  2 polyps seen which were removed and non bleeding internal hemorrhoids. He says he is supposed to get repeat Colonoscopy in 2024.   ROS: General- Denies fever, chills, loss of appetite and  loss of weight HEENT - Denies headache, blurry vision, neck pain, sinus pain Chest - Denies any chest pain, SOB or cough CVS- Denies any dizziness/lightheadedness, syncopal attacks, palpitations Abdomen- as above  Neuro - Denies any weakness, numbness, tingling sensation Psych - Denies any changes in mood irritability or depressive symptoms GU- Denies any burning, dysuria, hematuria or increased frequency of urination Skin - denies any rashes/lesions MSK - denies any joint pain/swelling or restricted ROM   Past Medical History:  Diagnosis Date  . Chronic kidney disease   . Colon polyps   . COVID-19    05/2019  . GERD (gastroesophageal reflux disease)   . Heart murmur   . History of kidney stones   . HIV (human immunodeficiency virus infection) ([place]) 1999  . Hypertension   . Pneumonia   . Stroke [facility])    right side weakness  . TIA (transient ischemic attack)    Past Surgical History:  Procedure Laterality Date  . CHOLECYSTECTOMY  1982  . COLONOSCOPY    . ESOPHAGEAL MANOMETRY N/A 09/03/2017   Procedure: ESOPHAGEAL MANOMETRY (EM);  Surgeon: Mauri Pole, MD;  Location: [facility];  Service: Endoscopy;  Laterality: N/A;  . IR ANGIO INTRA EXTRACRAN SEL COM CAROTID INNOMINATE BILAT MOD SED  04/21/2019  . IR ANGIO INTRA EXTRACRAN SEL COM CAROTID INNOMINATE BILAT MOD SED  05/11/2020  . IR ANGIO VERTEBRAL SEL VERTEBRAL BILAT MOD SED  04/21/2019  . IR ANGIO VERTEBRAL SEL VERTEBRAL BILAT MOD SED  05/11/2020  . IR PTA INTRACRANIAL  05/14/2019  . IR US GUIDE VASC ACCESS RIGHT  05/11/2020  . KNEE SURGERY     LEFT KNEE  . RADIOLOGY WITH ANESTHESIA N/A 05/14/2019   Procedure: IR WITH ANESTHESIA STENTING;  Surgeon: Luanne Bras, MD;  Location: [place];  Service: Radiology;  Laterality: N/A;  . TONSILLECTOMY       Scheduled Meds: . abacavir-dolutegravir-lamiVUDine  1 tablet Oral Daily  . clopidogrel  75 mg Oral Daily  . heparin  5,000 Units Subcutaneous Q8H  . rosuvastatin  20 mg  Oral Daily   Continuous Infusions: . sodium chloride 150 mL/hr at 09/06/20 1917  . ciprofloxacin 400 mg (09/06/20 2106)   PRN Meds:.acetaminophen **OR** acetaminophen  Allergies  Allergen Reactions  . Baclofen Other (See Comments)    Back pain. Patient said it made him feel crazy   . Efavirenz Rash  . Nevirapine Rash and Other (See Comments)   Social History   Socioeconomic History  . Marital status: Divorced    Spouse name: Not on file  . Number of children: Not on file  . Years of education: Not on file  . Highest education level: Not on file  Occupational History  . Occupation: retired    Fish farm manager: [employer]  Tobacco Use  . Smoking status: Never Smoker  . Smokeless tobacco: Former Systems developer  Types: Chew  . Tobacco comment: Rare use, about q 6 months  Vaping Use  . Vaping Use: Never used  Substance and Sexual Activity  . Alcohol use: Yes    Comment: rare  . Drug use: No  . Sexual activity: Yes    Partners: Female    Comment: declined condoms  Other Topics Concern  . Not on file  Social History Narrative   Right handed    Caffeine use: tea, mainly water   Social Determinants of Health   Financial Resource Strain: Not on file  Food Insecurity: Not on file  Transportation Needs: Not on file  Physical Activity: Not on file  Stress: Not on file  Social Connections: Not on file  Intimate Partner Violence: Not on file    Vitals BP 115/67 (BP Location: Right Arm)   Pulse 95   Temp 98.5 F (36.9 C) (Oral)   Resp 17   Ht 5' 10"  (1.778 m)   Wt 72.1 kg   SpO2 93%   BMI 22.81 kg/m    Physical Exam Constitutional:  not in acute distress, lying in bed     Comments:   Cardiovascular:     Rate and Rhythm: Normal rate and regular rhythm.     Heart sounds: No murmur heard.   Pulmonary:     Effort: Pulmonary effort is normal.     Comments: clear air sounds bilaterally   Abdominal:     Palpations: Abdomen is soft.     Tenderness: Non tender    Musculoskeletal:        General: No swelling or tenderness.   Skin:    Comments: No obvious lesions or rashes   Neurological:     General: No focal deficit present.   Psychiatric:        Mood and Affect: Mood normal.   Pertinent Microbiology Results for orders placed or performed during the hospital encounter of 09/06/20  SARS CORONAVIRUS 2 (TAT 6-24 HRS) Nasopharyngeal Nasopharyngeal Swab     Status: Abnormal   Collection Time: 09/06/20 12:02 PM   Specimen: Nasopharyngeal Swab  Result Value Ref Range Status   SARS Coronavirus 2 POSITIVE (A) NEGATIVE Final    Comment: (NOTE) SARS-CoV-2 target nucleic acids are DETECTED.  The SARS-CoV-2 RNA is generally detectable in upper and lower respiratory specimens during the acute phase of infection. Positive results are indicative of the presence of SARS-CoV-2 RNA. Clinical correlation with patient history and other diagnostic information is  necessary to determine patient infection status. Positive results do not rule out bacterial infection or co-infection with other viruses.  The expected result is Negative.  Fact Sheet for Patients: SugarRoll.be  Fact Sheet for Healthcare Providers: https://www.woods-mathews.com/  This test is not yet approved or cleared by the [place] FDA and  has been authorized for detection and/or diagnosis of SARS-CoV-2 by FDA under an Emergency Use Authorization (EUA). This EUA will remain  in effect (meaning this test can be used) for the duration of the COVID-19 declaration under Section 564(b)(1) of the Act, 21 U. S.C. section 360bbb-3(b)(1), unless the authorization is terminated or revoked sooner.   Performed at [facility], [place] [address] [place],  [address]   C Difficile Quick Screen w PCR reflex     Status: None   Collection Time: 09/06/20  3:38 PM   Specimen: STOOL  Result Value Ref Range Status   C Diff antigen NEGATIVE  NEGATIVE Final   C Diff toxin NEGATIVE NEGATIVE  Final   C Diff interpretation No C. difficile detected.  Final    Comment: Performed at [facility], [place] [address] [place], [place] [address]    Pertinent Lab seen by me: CBC Latest Ref Rng & Units 09/07/2020 09/06/2020 05/11/2020  WBC 4.0 - 10.5 K/uL 7.5 9.9 8.5  Hemoglobin 13.0 - 17.0 g/dL 11.3(L) 13.1 12.8(L)  Hematocrit 39.0 - 52.0 % 32.6(L) 38.4(L) 40.0  Platelets 150 - 400 K/uL 231 265 237   CMP Latest Ref Rng & Units 09/07/2020 09/06/2020 09/06/2020  Glucose 70 - 99 mg/dL 99 99 133(H)  BUN 8 - 23 mg/dL 23 27(H) 31(H)  Creatinine 0.61 - 1.24 mg/dL 1.56(H) 1.64(H) 1.98(H)  Sodium 135 - 145 mmol/L 129(L) 134(L) 131(L)  Potassium 3.5 - 5.1 mmol/L 3.1(L) 4.3 2.6(LL)  Chloride 98 - 111 mmol/L 100 101 94(L)  CO2 22 - 32 mmol/L 20(L) 20(L) 25  Calcium 8.9 - 10.3 mg/dL 7.6(L) 8.0(L) 8.4(L)  Total Protein 6.5 - 8.1 g/dL - - 6.4(L)  Total Bilirubin 0.3 - 1.2 mg/dL - - 0.9  Alkaline Phos 38 - 126 U/L - - 63  AST 15 - 41 U/L - - 25  ALT 0 - 44 U/L - - 25     Pertinent Imagings/Other Imagings Plain films and CT images have been personally visualized and interpreted; radiology reports have been reviewed. Decision making incorporated into the Impression / Recommendations.  CT chest W contrast 08/05/20 FINDINGS: Cardiovascular: Heart size appears upper limits of normal. No pericardial effusion. Coronary artery calcifications identified, including calcification within the proximal LAD coronary artery, image 77/2.  Mediastinum/Nodes: Normal appearance of the thyroid gland. The trachea appears patent and is midline. Normal appearance of the esophagus.  No axillary, supraclavicular, mediastinal, or hilar adenopathy.  Lungs/Pleura: Mild bilateral cylindrical type bronchiectasis. Peripheral predominant interstitial reticulation is identified within both lung bases. No superimposed pleural effusion, airspace consolidation or  atelectasis.  Upper Abdomen: No acute findings within the imaged portions of the upper abdomen. Previous cholecystectomy.  Musculoskeletal: No chest wall abnormality. No acute or significant osseous findings. A fiducial marker was placed over the lower lateral left chest wall indicating area of concern. No underlying rib deformity identified. No soft tissue mass or fluid collection identified.  IMPRESSION: 1. No acute findings and no explanation for patient's chest wall pain. 2. Mild chronic interstitial changes identified within the lungs including cylindrical bronchiectasis and peripheral interstitial reticulation. No signs of acute cardiopulmonary abnormality noted. 3. Coronary artery calcifications including calcifications in the LAD coronary artery   CT abdomen/pelvis 09/06/20 FINDINGS: Lower chest: Minimal scarring is noted in the bases bilaterally.  Hepatobiliary: Gallbladder has been surgically removed. Mild prominence of the biliary tree is noted due to the post cholecystectomy state. The liver is well visualized and within normal limits.  Pancreas: Unremarkable. No pancreatic ductal dilatation or surrounding inflammatory changes.  Spleen: Normal in size without focal abnormality.  Adrenals/Urinary Tract: Adrenal glands are within normal limits. Kidneys demonstrate a normal enhancement pattern. No renal calculi or obstructive changes are seen. Normal excretion is noted on delayed images. Bladder is partially distended.  Stomach/Bowel: Wall thickening and edema is noted within the rectosigmoid and to a lesser degree throughout the remainder of the colon. These changes are consistent with diffuse colitis and are new from the prior exam. No perforation or abscess formation is noted. The appendix is within normal limits. Small bowel and stomach are unremarkable.  Vascular/Lymphatic: No significant vascular findings are present. No enlarged abdominal  or  pelvic lymph nodes.  Reproductive: Prostate is unremarkable.  Other: Fat containing left inguinal hernia is noted.  Musculoskeletal: Degenerative changes of lumbar spine are noted.  IMPRESSION: Changes consistent with diffuse colitis worst in the left colon and rectosigmoid without evidence of abscess or perforation. This corresponds with the given clinical history of diarrhea and likely represents an infectious colitis.  Status post cholecystectomy.  I have spent more than 70  minutes for this patient encounter including review of prior medical records with greater than 50% of time being face to face and coordination of their care.  Electronically signed by:   Rosiland Oz, MD Infectious Disease Physician [facility] for Infectious Disease Pager: [phone]

## 2020-09-07 NOTE — Progress Notes (Signed)
Family Medicine Teaching Service Daily Progress Note Intern Pager: [phone]  Patient name: Jeffrey [name] Medical record number: [id number] Date of birth: [birth year] Age: 75 y.o. Gender: male  Primary Care Provider: Danna Hefty, DO Consultants: ID Code Status: Full  Pt Overview and Major Events to Date:  5/3-admitted to FMTS  Assessment and Plan: Jeffrey [name] is a 75 year old male presenting with diarrhea*1 week.  PMH is significant for R ICA stenosis, HTN,HFpEF (3/21),CAD, GERD, dysphagia, peripheral vertigo, cervical lymphadenopathy, CKD, BPH, sepsis, pneumonia, occipital neuralgia, HLD, HIV,, colon polyps, stroke, TIA.  Infectious colitis SARS CoV2 infection Patient is improving, decreased bowel movements today.  Consulted ID given C. difficile negative, may be possibility COVID-related.  ID suggested repeating [place] RT PCT CT evaluate for possible reinfection.  She does not have any respiratory symptoms and is on room air.  They do not think the diarrhea is HIV related given acute onset, and well-controlled HIV and a compliant patient, possibly COVID-related vs infectious colitis. -- Appreciate ID recommendations -- DC ciprofloxacin -- Start ceftriaxone and metronidazole -- f/u GI PCR panel -- f/u SARcov2/Influenza A and B panel for CT values -- Monitor CBC and BMP -- Continue IV fluids, may decrease after lunch -- Appreciate PT/OT recommendations  Hypokalemia 2/2 diarrhea K+ on admission 2.6 likely in setting of severe diarrhea.  Repeat potassium was 4.3, this morning 3.1 --Kcl 40 mg two times  --f/u mg -Continue to monitor and replete as needed -BMP in a.m.  HIV HIV viral load undetectable 1 year ago.  Home medication: abacavir-dolutegravir-lamivudine. Follows Dr Megan Salon at [facility].  CD4 354, was 469 on 10/21. -Continue Triumeq -f/u HIV-1 RNA quant  Acute on chronic kidney disease Cr. 1.56>1.64>1.98.  It is downtrending and baseline appears to be around  1.5. --Avoid nephrotoxic agents --Continue IV hydration, switch to oral hydration when able to tolerate  Hx CVA/ TIA Normal neuro exam -Continue Plavix  HTN Blood pressure normotensive 115/67 -Continue hold home medications -Monitor blood pressure  GERD Follows with [facility] Home medications: Protonix 20 mg twice daily -Hold home med  HFpEF Last echo 3/21 with EF of 60-65% and with normal functions and no wall abnormalities. -Strict Is & Os -Daily weights  CAD, HLD Known CAD with carotid artery stenosis.  Home medication Crestor 20 mg daily -- Crestor 20 mg      FEN/GI: Protonix PPx: Heparin   Status is: Observation  The patient will require care spanning > 2 midnights and should be moved to inpatient because: Ongoing active pain requiring inpatient pain management  Dispo: The patient is from: Home              Anticipated d/c is to: Home              Patient currently is not medically stable to d/c.   Difficult to place patient No        Subjective:  No acute overnight events. Patient states he is feeling better this morning, still having diarrhea but frequency has decreased.  He is able to tolerate his food orally.  Denies any other complaints.  Objective: Temp:  [97.9 F (36.6 C)-98.8 F (37.1 C)] 97.9 F (36.6 C) (05/03 1220) Pulse Rate:  [81-103] 81 (05/03 1220) Resp:  [13-20] 18 (05/03 1220) BP: (104-122)/(67-102) 109/69 (05/03 1220) SpO2:  [92 %-99 %] 96 % (05/03 1220) Weight:  [155 lb 3.3 oz (70.4 kg)-158 lb 15.2 oz (72.1 kg)] 158 lb 15.2 oz (72.1 kg) (05/03 0357) Physical  Exam: General: Alert, awake, sitting comfortably in the chair Cardiovascular: RRR Respiratory: CTAB Abdomen: Soft, nondistended, mildly tender to palpation in left and right lower quadrants.  Bowel sounds present Extremities: Warm and dry, no edema  Laboratory: Recent Labs  Lab 09/06/20 1150 09/07/20 0130  WBC 9.9 7.5  HGB 13.1 11.3*  HCT 38.4* 32.6*  PLT 265  231   Recent Labs  Lab 09/06/20 1150 09/06/20 2048 09/07/20 0130  NA 131* 134* 129*  K 2.6* 4.3 3.1*  CL 94* 101 100  CO2 25 20* 20*  BUN 31* 27* 23  CREATININE 1.98* 1.64* 1.56*  CALCIUM 8.4* 8.0* 7.6*  PROT 6.4*  --   --   BILITOT 0.9  --   --   ALKPHOS 63  --   --   ALT 25  --   --   AST 25  --   --   GLUCOSE 133* 99 99    Imaging/Diagnostic Tests: CT ABDOMEN PELVIS W CONTRAST  Result Date: 09/06/2020 CLINICAL DATA:  Acute abdominal pain for several days EXAM: CT ABDOMEN AND PELVIS WITH CONTRAST TECHNIQUE: Multidetector CT imaging of the abdomen and pelvis was performed using the standard protocol following bolus administration of intravenous contrast. CONTRAST:  33m OMNIPAQUE IOHEXOL 300 MG/ML  SOLN COMPARISON:  09/06/2017 FINDINGS: Lower chest: Minimal scarring is noted in the bases bilaterally. Hepatobiliary: Gallbladder has been surgically removed. Mild prominence of the biliary tree is noted due to the post cholecystectomy state. The liver is well visualized and within normal limits. Pancreas: Unremarkable. No pancreatic ductal dilatation or surrounding inflammatory changes. Spleen: Normal in size without focal abnormality. Adrenals/Urinary Tract: Adrenal glands are within normal limits. Kidneys demonstrate a normal enhancement pattern. No renal calculi or obstructive changes are seen. Normal excretion is noted on delayed images. Bladder is partially distended. Stomach/Bowel: Wall thickening and edema is noted within the rectosigmoid and to a lesser degree throughout the remainder of the colon. These changes are consistent with diffuse colitis and are new from the prior exam. No perforation or abscess formation is noted. The appendix is within normal limits. Small bowel and stomach are unremarkable. Vascular/Lymphatic: No significant vascular findings are present. No enlarged abdominal or pelvic lymph nodes. Reproductive: Prostate is unremarkable. Other: Fat containing left inguinal  hernia is noted. Musculoskeletal: Degenerative changes of lumbar spine are noted. IMPRESSION: Changes consistent with diffuse colitis worst in the left colon and rectosigmoid without evidence of abscess or perforation. This corresponds with the given clinical history of diarrhea and likely represents an infectious colitis. Status post cholecystectomy. Electronically Signed   By: MInez CatalinaM.D.   On: 09/06/2020 14:22    Maegen Wigle, AMeredith Staggers MD 09/07/2020, 1:24 PM PGY-1, CParisIntern pager: [phone] text pages welcome

## 2020-09-07 NOTE — Evaluation (Signed)
Occupational Therapy Evaluation and Discharge Patient Details Name: Jeffrey [name] MRN: [id number] DOB: Jul 15, 1945 Today's Date: 09/07/2020    History of Present Illness Pt is a 75 yo male admitted with diarrhea for over one week.  CDIFF -.  Pt covid + in Jan and is Covid + on this admit (just reported during therapy session).  Pt with h/o HIV, TIA, CVA, R ICA stenosis, CAD, GERD, peripheral vertigo, CKD, syphlis, pneumonia, occipital neuralgia.   Clinical Impression   Pt admitted with the above diagnosis and overall is close to or at baseline for adls. Pt feels slightly weak from the days of diarrhea but was able to complete basic adls in room. Pt lives alone.  Pt could have son come stay with him if needed at d/c.  No further acute OT needs.    Follow Up Recommendations  No OT follow up;Supervision - Intermittent    Equipment Recommendations  None recommended by OT    Recommendations for Other Services       Precautions / Restrictions Precautions Precautions: Other (comment) Precaution Comments: covid precautions Restrictions Weight Bearing Restrictions: No      Mobility Bed Mobility Overal bed mobility: Independent             General bed mobility comments: no assist needed    Transfers Overall transfer level: Needs assistance Equipment used: None Transfers: Sit to/from Stand;Stand Pivot Transfers Sit to Stand: Supervision Stand pivot transfers: Supervision       General transfer comment: no hands on assist needed but close supervision since pt has not been up and feels weak.    Balance Overall balance assessment: Mild deficits observed, not formally tested                                         ADL either performed or assessed with clinical judgement   ADL Overall ADL's : At baseline                                       General ADL Comments: Pt mildly unsteady when first up but has not been up since arrival.      Vision Baseline Vision/History: Wears glasses Wears Glasses: At all times Patient Visual Report: No change from baseline Vision Assessment?: No apparent visual deficits     Perception Perception Perception Tested?: No   Praxis Praxis Praxis tested?: Within functional limits    Pertinent Vitals/Pain Pain Assessment: No/denies pain     Hand Dominance Right   Extremity/Trunk Assessment Upper Extremity Assessment Upper Extremity Assessment: Overall WFL for tasks assessed   Lower Extremity Assessment Lower Extremity Assessment: Defer to PT evaluation   Cervical / Trunk Assessment Cervical / Trunk Assessment: Normal   Communication Communication Communication: No difficulties   Cognition Arousal/Alertness: Awake/alert Behavior During Therapy: WFL for tasks assessed/performed Overall Cognitive Status: Within Functional Limits for tasks assessed                                     General Comments  Pt states he  feels close to baseline, just weak.  Pt feels stronger then when he was admitted yesterday.    Exercises     Shoulder Instructions  Home Living Family/patient expects to be discharged to:: Private residence Living Arrangements: Alone Available Help at Discharge: Family Type of Home: House Home Access: Level entry     [place]: One level     Bathroom Shower/Tub: Occupational psychologist: [place]: Environmental consultant - 2 wheels;Cane - single point          Prior Functioning/Environment Level of Independence: Independent        Comments: Pt fully independent. Does not use assistive devices.  Drives and cares for self.        OT Problem List:        OT Treatment/Interventions:      OT Goals(Current goals can be found in the care plan section) Acute Rehab OT Goals Patient Stated Goal: to get better OT Goal Formulation: All assessment and education complete, DC therapy  OT Frequency:      Barriers to D/C:            Co-evaluation              AM-PAC OT "6 Clicks" Daily Activity     Outcome Measure Help from another person eating meals?: None Help from another person taking care of personal grooming?: None Help from another person toileting, which includes using toliet, bedpan, or urinal?: None Help from another person bathing (including washing, rinsing, drying)?: None Help from another person to put on and taking off regular upper body clothing?: None Help from another person to put on and taking off regular lower body clothing?: None 6 Click Score: 24   End of Session Nurse Communication: Mobility status  Activity Tolerance: Patient tolerated treatment well Patient left: in chair;with call bell/phone within reach  OT Visit Diagnosis: Unsteadiness on feet (R26.81)                Time: 1115-5208 OT Time Calculation (min): 20 min Charges:  OT General Charges $OT Visit: 1 Visit OT Evaluation $OT Eval Low Complexity: 1 Low  Glenford Peers 09/07/2020, 7:52 AM

## 2020-09-08 DIAGNOSIS — A09 Infectious gastroenteritis and colitis, unspecified: Secondary | ICD-10-CM | POA: Diagnosis not present

## 2020-09-08 DIAGNOSIS — K5180 Other ulcerative colitis without complications: Secondary | ICD-10-CM

## 2020-09-08 LAB — BASIC METABOLIC PANEL
Anion gap: 6 (ref 5–15)
BUN: 19 mg/dL (ref 8–23)
CO2: 21 mmol/L — ABNORMAL LOW (ref 22–32)
Calcium: 7.5 mg/dL — ABNORMAL LOW (ref 8.9–10.3)
Chloride: 107 mmol/L (ref 98–111)
Creatinine, Ser: 1.38 mg/dL — ABNORMAL HIGH (ref 0.61–1.24)
GFR, Estimated: 54 mL/min — ABNORMAL LOW (ref >60)
Glucose, Bld: 101 mg/dL — ABNORMAL HIGH (ref 70–99)
Potassium: 3.1 mmol/L — ABNORMAL LOW (ref 3.5–5.1)
Sodium: 134 mmol/L — ABNORMAL LOW (ref 135–145)

## 2020-09-08 LAB — GASTROINTESTINAL PANEL BY PCR, STOOL (REPLACES STOOL CULTURE)

## 2020-09-08 LAB — CBC
HCT: 33.5 % — ABNORMAL LOW (ref 39.0–52.0)
Hemoglobin: 11.4 g/dL — ABNORMAL LOW (ref 13.0–17.0)
MCH: 33.2 pg (ref 26.0–34.0)
MCHC: 34 g/dL (ref 30.0–36.0)
MCV: 97.7 fL (ref 80.0–100.0)
Platelets: 233 10*3/uL (ref 150–400)
RBC: 3.43 MIL/uL — ABNORMAL LOW (ref 4.22–5.81)
RDW: 13.9 % (ref 11.5–15.5)
WBC: 7.9 10*3/uL (ref 4.0–10.5)
nRBC: 0 % (ref 0.0–0.2)

## 2020-09-08 LAB — HIV-1 RNA QUANT-NO REFLEX-BLD
HIV 1 RNA Quant: <20 copies/mL
LOG10 HIV-1 RNA: UNDETERMINED log10copy/mL

## 2020-09-08 LAB — GLUCOSE, CAPILLARY
Glucose-Capillary: 101 mg/dL — ABNORMAL HIGH (ref 70–99)
Glucose-Capillary: 113 mg/dL — ABNORMAL HIGH (ref 70–99)

## 2020-09-08 MED ORDER — LEVOFLOXACIN 500 MG PO TABS
500.0000 mg | ORAL_TABLET | Freq: Every day | ORAL | Status: DC
  Administered 2020-09-08 – 2020-09-09 (×2): 500 mg via ORAL
  Filled 2020-09-08 (×2): qty 1

## 2020-09-08 MED ORDER — POTASSIUM CHLORIDE CRYS ER 20 MEQ PO TBCR
40.0000 meq | EXTENDED_RELEASE_TABLET | Freq: Once | ORAL | Status: AC
  Administered 2020-09-08: 40 meq via ORAL
  Filled 2020-09-08: qty 2

## 2020-09-08 MED ORDER — POTASSIUM CHLORIDE CRYS ER 20 MEQ PO TBCR
40.0000 meq | EXTENDED_RELEASE_TABLET | Freq: Two times a day (BID) | ORAL | Status: AC
  Administered 2020-09-08 (×2): 40 meq via ORAL
  Filled 2020-09-08 (×2): qty 2

## 2020-09-08 NOTE — Progress Notes (Signed)
[facility] Infectious Diseases Follow Up Note  Patient Identification: Patient Name: Jeffrey [name] MRN: [id number] [place] Date: 09/06/2020 11:20 AM Age: 75 y.o.Today's Date: 09/08/2020   Reason for Visit: Diarrhea   Active Problems:   Diarrhea   Infectious colitis  Antibiotics: ciprofloxacin 5/2                     Ceftriaxone 5/3-                    Metronidazole 5/3-  Lines/Tubes: PIVs    Interval Events: afebrile, no leukcoytosis, GI PCR panel positive for Shigella   Assessment Shigella/Enteroinvasive E coli diarrhea with colitis  SARCov2 infection  CT values 40.9, likely not a re-infection   HIV well controlled - on Triumeq   Recommendations Will change Ceftriaxone and Metronidazole to Levofloxacin, plan for 3 days as patient is able to take PO.  Patient already has a fu appt with Dr Megan Salon on 03/03/21 at 9:45 am for his HIV care.   Rest of the management as per the primary team. Thank you for the consult. Please page with pertinent questions or concerns.  ______________________________________________________________________ Subjective patient seen and examined at the bedside. Had last BM on 1am which was semisolid. Ate all his breakfast this morning. Denies nausea, vomiting, abdominal pain and diarrhea    Vitals BP 123/83 (BP Location: Right Arm)   Pulse 89   Temp 98.7 F (37.1 C) (Oral)   Resp 20   Ht 5' 10"  (1.778 m)   Wt 75.5 kg   SpO2 97%   BMI 23.88 kg/m      Physical Exam Constitutional:  Not in acute distress, comfortable     Comments:   Cardiovascular:     Rate and Rhythm: Normal rate and regular rhythm.     Heart sounds: No murmur heard.   Pulmonary:     Effort: Pulmonary effort is normal.     Comments:   Abdominal:     Palpations: Abdomen is soft.     Tenderness: Non tender, BS+  Musculoskeletal:        General: No swelling or tenderness.   Skin:    Comments: No obvious  lesions or rashes   Neurological:     General: No focal deficit present.   Psychiatric:        Mood and Affect: Mood normal.     Pertinent Microbiology Results for orders placed or performed during the hospital encounter of 09/06/20  SARS CORONAVIRUS 2 (TAT 6-24 HRS) Nasopharyngeal Nasopharyngeal Swab     Status: Abnormal   Collection Time: 09/06/20 12:02 PM   Specimen: Nasopharyngeal Swab  Result Value Ref Range Status   SARS Coronavirus 2 POSITIVE (A) NEGATIVE Final    Comment: (NOTE) SARS-CoV-2 target nucleic acids are DETECTED.  The SARS-CoV-2 RNA is generally detectable in upper and lower respiratory specimens during the acute phase of infection. Positive results are indicative of the presence of SARS-CoV-2 RNA. Clinical correlation with patient history and other diagnostic information is  necessary to determine patient infection status. Positive results do not rule out bacterial infection or co-infection with other viruses.  The expected result is Negative.  Fact Sheet for Patients: SugarRoll.be  Fact Sheet for Healthcare Providers: https://www.woods-mathews.com/  This test is not yet approved or cleared by the [place] FDA and  has been authorized for detection and/or diagnosis of SARS-CoV-2 by FDA under an Emergency Use Authorization (EUA). This EUA will remain  in effect (  meaning this test can be used) for the duration of the COVID-19 declaration under Section 564(b)(1) of the Act, 21 U. S.C. section 360bbb-3(b)(1), unless the authorization is terminated or revoked sooner.   Performed at [facility], [place] [address] [place], [place] [address]   C Difficile Quick Screen w PCR reflex     Status: None   Collection Time: 09/06/20  3:38 PM   Specimen: STOOL  Result Value Ref Range Status   C Diff antigen NEGATIVE NEGATIVE Final   C Diff toxin NEGATIVE NEGATIVE Final   C Diff interpretation No C. difficile  detected.  Final    Comment: Performed at [facility], [place] [address] [place], [place] [address]  Resp Panel by RT-PCR (Flu A&B, Covid) Nasopharyngeal Swab     Status: Abnormal   Collection Time: 09/07/20  1:05 PM   Specimen: Nasopharyngeal Swab; Nasopharyngeal(NP) swabs in vial transport medium  Result Value Ref Range Status   SARS Coronavirus 2 by RT PCR POSITIVE (A) NEGATIVE Final    Comment: RESULT CALLED TO, READ BACK BY AND VERIFIED WITH: M,TETTEH @1544  09/07/20 EB (NOTE) SARS-CoV-2 target nucleic acids are DETECTED.  The SARS-CoV-2 RNA is generally detectable in upper respiratory specimens during the acute phase of infection. Positive results are indicative of the presence of the identified virus, but do not rule out bacterial infection or co-infection with other pathogens not detected by the test. Clinical correlation with patient history and other diagnostic information is necessary to determine patient infection status. The expected result is Negative.  Fact Sheet for Patients: EntrepreneurPulse.com.au  Fact Sheet for Healthcare Providers: IncredibleEmployment.be  This test is not yet approved or cleared by the [place] FDA and  has been authorized for detection and/or diagnosis of SARS-CoV-2 by FDA under an Emergency Use Authorization (EUA).  This EUA will remain in effect (meaning this test can be used) fo r the duration of  the COVID-19 declaration under Section 564(b)(1) of the Act, 21 U.S.C. section 360bbb-3(b)(1), unless the authorization is terminated or revoked sooner.     Influenza A by PCR NEGATIVE NEGATIVE Final   Influenza B by PCR NEGATIVE NEGATIVE Final    Comment: (NOTE) The Xpert Xpress SARS-CoV-2/FLU/RSV plus assay is intended as an aid in the diagnosis of influenza from Nasopharyngeal swab specimens and should not be used as a sole basis for treatment. Nasal washings and aspirates are unacceptable for  Xpert Xpress SARS-CoV-2/FLU/RSV testing.  Fact Sheet for Patients: EntrepreneurPulse.com.au  Fact Sheet for Healthcare Providers: IncredibleEmployment.be  This test is not yet approved or cleared by the [place] FDA and has been authorized for detection and/or diagnosis of SARS-CoV-2 by FDA under an Emergency Use Authorization (EUA). This EUA will remain in effect (meaning this test can be used) for the duration of the COVID-19 declaration under Section 564(b)(1) of the Act, 21 U.S.C. section 360bbb-3(b)(1), unless the authorization is terminated or revoked.  Performed at [facility], [place] [address] [place], [place] [address]   Gastrointestinal Panel by PCR , Stool     Status: Abnormal   Collection Time: 09/07/20  3:36 PM   Specimen: Stool  Result Value Ref Range Status   Campylobacter species NOT DETECTED NOT DETECTED Final   Plesimonas shigelloides NOT DETECTED NOT DETECTED Final   Salmonella species NOT DETECTED NOT DETECTED Final   Yersinia enterocolitica NOT DETECTED NOT DETECTED Final   Vibrio species NOT DETECTED NOT DETECTED Final   Vibrio cholerae NOT DETECTED  NOT DETECTED Final   Enteroaggregative E coli (EAEC) NOT DETECTED NOT DETECTED Final   Enteropathogenic E coli (EPEC) NOT DETECTED NOT DETECTED Final   Enterotoxigenic E coli (ETEC) NOT DETECTED NOT DETECTED Final   Shiga like toxin producing E coli (STEC) NOT DETECTED NOT DETECTED Final   Shigella/Enteroinvasive E coli (EIEC) DETECTED (A) NOT DETECTED Final    Comment: RESULT CALLED TO, READ BACK BY AND VERIFIED WITH: NOTIFIED ISIAH BATOON AT [facility] AT 0028 09/08/20 MF    Cryptosporidium NOT DETECTED NOT DETECTED Final   Cyclospora cayetanensis NOT DETECTED NOT DETECTED Final   Entamoeba histolytica NOT DETECTED NOT DETECTED Final   Giardia lamblia NOT DETECTED NOT DETECTED Final   Adenovirus F40/41 NOT DETECTED NOT DETECTED Final   Astrovirus NOT DETECTED  NOT DETECTED Final   Norovirus GI/GII NOT DETECTED NOT DETECTED Final   Rotavirus A NOT DETECTED NOT DETECTED Final   Sapovirus (I, II, IV, and V) NOT DETECTED NOT DETECTED Final    Comment: Performed at [facility], [address] [place], [place] [address]    Pertinent Lab. CBC Latest Ref Rng & Units 09/08/2020 09/07/2020 09/06/2020  WBC 4.0 - 10.5 K/uL 7.9 7.5 9.9  Hemoglobin 13.0 - 17.0 g/dL 11.4(L) 11.3(L) 13.1  Hematocrit 39.0 - 52.0 % 33.5(L) 32.6(L) 38.4(L)  Platelets 150 - 400 K/uL 233 231 265    CMP Latest Ref Rng & Units 09/08/2020 09/07/2020 09/06/2020  Glucose 70 - 99 mg/dL 101(H) 99 99  BUN 8 - 23 mg/dL 19 23 27(H)  Creatinine 0.61 - 1.24 mg/dL 1.38(H) 1.56(H) 1.64(H)  Sodium 135 - 145 mmol/L 134(L) 129(L) 134(L)  Potassium 3.5 - 5.1 mmol/L 3.1(L) 3.1(L) 4.3  Chloride 98 - 111 mmol/L 107 100 101  CO2 22 - 32 mmol/L 21(L) 20(L) 20(L)  Calcium 8.9 - 10.3 mg/dL 7.5(L) 7.6(L) 8.0(L)  Total Protein 6.5 - 8.1 g/dL - - -  Total Bilirubin 0.3 - 1.2 mg/dL - - -  Alkaline Phos 38 - 126 U/L - - -  AST 15 - 41 U/L - - -  ALT 0 - 44 U/L - - -    Pertinent Imaging today Plain films and CT images have been personally visualized and interpreted; radiology reports have been reviewed. Decision making incorporated into the Impression / Recommendations.  I have spent more than 35 minutes for this patient encounter including review of prior medical records, coordination of care  with greater than 50% of time being face to face/counseling and discussing diagnostics/treatment plan with the patient/family.  Electronically signed by:   Rosiland Oz, MD Infectious Disease Physician [facility] Pager: [phone]

## 2020-09-08 NOTE — Progress Notes (Signed)
Physical Therapy Treatment Patient Details Name: Jeffrey [name] MRN: [id number] DOB: 1945/08/16 Today's Date: 09/08/2020    History of Present Illness Pt is a 75 yo male admitted with diarrhea for over one week.  CDIFF -.  Pt covid + in Jan and is Covid + on this admit (just reported during therapy session).  Pt with h/o HIV, TIA, CVA, R ICA stenosis, CAD, GERD, peripheral vertigo, CKD, syphlis, pneumonia, occipital neuralgia.    PT Comments    Progressing well toward goals, limited by COVID restrictions.  Emphasis on gait stability and standing exercise with monitoring of vitals.   Follow Up Recommendations  No PT follow up     Equipment Recommendations  None recommended by PT    Recommendations for Other Services       Precautions / Restrictions Precautions Precautions: Other (comment) (covid+)    Mobility  Bed Mobility Overal bed mobility: Independent                  Transfers Overall transfer level: Modified independent Equipment used: None   Sit to Stand: Modified independent (Device/Increase time)            Ambulation/Gait Ambulation/Gait assistance: Supervision;Modified independent (Device/Increase time) (limited in the room by lines) Gait Distance (Feet): 25 Feet Assistive device: None       General Gait Details: steady overall   Stairs             Wheelchair Mobility    Modified Rankin (Stroke Patients Only)       Balance Overall balance assessment: No apparent balance deficits (not formally assessed)                                          Cognition Arousal/Alertness: Awake/alert Behavior During Therapy: WFL for tasks assessed/performed Overall Cognitive Status: Within Functional Limits for tasks assessed                                        Exercises General Exercises - Lower Extremity Hip ABduction/ADduction: AROM;Strengthening;Both;Standing Hip Flexion/Marching:  AROM;Strengthening;Both;10 reps;Standing Toe Raises: AROM;Strengthening;10 reps;Standing;Both Heel Raises: AROM;Strengthening;10 reps;Standing Mini-Sqauts: AROM;Strengthening;15 reps;Standing Other Exercises Other Exercises: knee flexion bil in standing x10 reps Other Exercises: standing push ups off sink base x 10 reps Other Exercises: bicep/tricep presses in standing x10 reps.    General Comments General comments (skin integrity, edema, etc.): SpO2 on RA 96% or better,  EHR upper 80's to 112 bpm with moderate standing exercise.      Pertinent Vitals/Pain Pain Assessment: No/denies pain    Home Living                      Prior Function            PT Goals (current goals can now be found in the care plan section) Acute Rehab PT Goals Patient Stated Goal: to get better PT Goal Formulation: With patient Time For Goal Achievement: 09/10/20 Potential to Achieve Goals: Good Progress towards PT goals: Progressing toward goals    Frequency    Min 3X/week      PT Plan Current plan remains appropriate    Co-evaluation              AM-PAC PT "6 Clicks" Mobility   Outcome Measure  Help needed turning from your back to your side while in a flat bed without using bedrails?: None Help needed moving from lying on your back to sitting on the side of a flat bed without using bedrails?: None Help needed moving to and from a bed to a chair (including a wheelchair)?: None Help needed standing up from a chair using your arms (e.g., wheelchair or bedside chair)?: None Help needed to walk in hospital room?: A Little Help needed climbing 3-5 steps with a railing? : A Little 6 Click Score: 22    End of Session   Activity Tolerance: Patient tolerated treatment well Patient left: in bed;with call bell/phone within reach Nurse Communication: Mobility status PT Visit Diagnosis: Muscle weakness (generalized) (M62.81);Other abnormalities of gait and mobility (R26.89)      Time: 2725-3664 PT Time Calculation (min) (ACUTE ONLY): 27 min  Charges:  $Gait Training: 8-22 mins $Therapeutic Exercise: 8-22 mins                     09/08/2020  Ginger Carne., PT Acute Rehabilitation Services [phone]  (pager) [phone]  (office)   [name] 09/08/2020, 2:51 PM

## 2020-09-08 NOTE — Progress Notes (Signed)
Family Medicine Teaching Service Daily Progress Note Intern Pager: [phone]  Patient name: Jeffrey [name] Medical record number: [id number] Date of birth: 07-15-1945 Age: 75 y.o. Gender: male  Primary Care Provider: Danna Hefty, DO Consultants: ID Code Status: Full  Pt Overview and Major Events to Date:  5/3-admitted to FMTS  Assessment and Plan: Jeffrey [name] is a 75 year old male presenting with diarrhea*1 week admitted for infectious colitis .  PMH is significant for R ICA stenosis, HTN,HFpEF (3/21),CAD, GERD, dysphagia, peripheral vertigo, cervical lymphadenopathy, CKD, BPH, sepsis, pneumonia, occipital neuralgia, HLD, HIV,, colon polyps, stroke, TIA.  Infectious colitis 2/2 Shigella SARS CoV2 infection Patient has Shigella infection. Patient is improving, decreased bowel movements today.  Consulted ID and appreciate their recommendations.  Planning to change patient's IV antibiotics to oral today. Repeat SARS COVID is positive but influenza A and B negative. -- Appreciate ID recommendations -- D/C ceftriaxone (3/5) and metronidazole (3/5) -- Start levofloxacin 500 mg daily (3/6) -- Monitor CBC and BMP -- D/C IV fluids -- Appreciate PT/OT recommendations  Hypokalemia 2/2 diarrhea K+ this morning is 3.1, mag normal 2.2 --Kcl 40 mg  -Continue to monitor and replete as needed -BMP in a.m.  HIV HIV viral load undetectable 1 year ago.  Home medication: abacavir-dolutegravir-lamivudine. Follows Dr [facility] at [facility].  CD4 354, was 469 on 10/21. -Continue Triumeq -f/u HIV-1 RNA quant  Acute on chronic kidney disease Cr. 1.38>1.56>1.64>1.98.  It is downtrending and baseline appears to be around 1.5. --Avoid nephrotoxic agents --Continue IV hydration, switch to oral hydration when able to tolerate  Hx CVA/ TIA Normal neuro exam -Continue Plavix  HTN Blood pressure normotensive 123/83 -Continue hold home medications -Monitor blood pressure  GERD Follows with [facility] Home medications: Protonix 20 mg twice daily -Continue protonix 20 mg daily  HFpEF Last echo 3/21 with EF of 60-65% and with normal functions and no wall abnormalities. -Strict Is & Os -Daily weights  CAD, HLD Known CAD with carotid artery stenosis.  Home medication Crestor 20 mg daily -- Crestor 20 mg      FEN/GI: Protonix PPx: Heparin   Status is: Observation  The patient will require care spanning > 2 midnights and should be moved to inpatient because: Ongoing active pain requiring inpatient pain management  Dispo: The patient is from: Home              Anticipated d/c is to: Home              Patient currently is not medically stable to d/c.   Difficult to place patient No        Subjective:  No acute overnight events. Patient states his last bowel movement was at 1:00 night and it was semisolid, did not have any other bowel movement until this morning.  Able to finish his breakfast, does not report any nausea vomiting or regurgitation.  No new complaints.  Objective: Temp:  [97.9 F (36.6 C)-98.7 F (37.1 C)] 98.7 F (37.1 C) (05/04 0411) Pulse Rate:  [80-89] 89 (05/04 0411) Resp:  [18-21] 20 (05/04 0411) BP: (109-123)/(69-83) 123/83 (05/04 0411) SpO2:  [96 %-97 %] 97 % (05/04 0411) Weight:  [162 lb 11.2 oz (73.8 kg)-166 lb 7.2 oz (75.5 kg)] 166 lb 7.2 oz (75.5 kg) (05/04 0500)   Physical Exam: General: Alert, pleasant, sitting comfortably in bed Cardiovascular: Regular rate and rhythm Respiratory: Clear to auscultation bilaterally Abdomen: Soft, nondistended, mildly tender to palpation in lower quadrants bilaterally.  Bowel sounds+  Extremities: Warm and dry, no edema Psychiatric: Normal mood and affect  Laboratory: Recent Labs  Lab 09/06/20 1150 09/07/20 0130 09/08/20 0145  WBC 9.9 7.5 7.9  HGB 13.1 11.3* 11.4*  HCT 38.4* 32.6* 33.5*  PLT 265 231 233   Recent Labs  Lab 09/06/20 1150 09/06/20 2048 09/07/20 0130 09/08/20 0145  NA 131*  134* 129* 134*  K 2.6* 4.3 3.1* 3.1*  CL 94* 101 100 107  CO2 25 20* 20* 21*  BUN 31* 27* 23 19  CREATININE 1.98* 1.64* 1.56* 1.38*  CALCIUM 8.4* 8.0* 7.6* 7.5*  PROT 6.4*  --   --   --   BILITOT 0.9  --   --   --   ALKPHOS 63  --   --   --   ALT 25  --   --   --   AST 25  --   --   --   GLUCOSE 133* 99 99 101*    Imaging/Diagnostic Tests: No results found.  Honor Junes, MD 09/08/2020, 10:49 AM PGY-1, [place] Intern pager: [phone], text pages welcome

## 2020-09-09 ENCOUNTER — Other Ambulatory Visit: Payer: Self-pay

## 2020-09-09 ENCOUNTER — Other Ambulatory Visit (HOSPITAL_COMMUNITY): Payer: Self-pay

## 2020-09-09 DIAGNOSIS — A09 Infectious gastroenteritis and colitis, unspecified: Secondary | ICD-10-CM | POA: Diagnosis not present

## 2020-09-09 DIAGNOSIS — E860 Dehydration: Secondary | ICD-10-CM

## 2020-09-09 DIAGNOSIS — I5030 Unspecified diastolic (congestive) heart failure: Secondary | ICD-10-CM

## 2020-09-09 LAB — CBC
HCT: 31.4 % — ABNORMAL LOW (ref 39.0–52.0)
Hemoglobin: 10.6 g/dL — ABNORMAL LOW (ref 13.0–17.0)
MCH: 33.1 pg (ref 26.0–34.0)
MCHC: 33.8 g/dL (ref 30.0–36.0)
MCV: 98.1 fL (ref 80.0–100.0)
Platelets: 240 10*3/uL (ref 150–400)
RBC: 3.2 MIL/uL — ABNORMAL LOW (ref 4.22–5.81)
RDW: 13.8 % (ref 11.5–15.5)
WBC: 7.2 10*3/uL (ref 4.0–10.5)
nRBC: 0 % (ref 0.0–0.2)

## 2020-09-09 LAB — BASIC METABOLIC PANEL
Anion gap: 7 (ref 5–15)
BUN: 14 mg/dL (ref 8–23)
CO2: 20 mmol/L — ABNORMAL LOW (ref 22–32)
Calcium: 7.7 mg/dL — ABNORMAL LOW (ref 8.9–10.3)
Chloride: 108 mmol/L (ref 98–111)
Creatinine, Ser: 1.33 mg/dL — ABNORMAL HIGH (ref 0.61–1.24)
GFR, Estimated: 57 mL/min — ABNORMAL LOW (ref >60)
Glucose, Bld: 102 mg/dL — ABNORMAL HIGH (ref 70–99)
Potassium: 3.5 mmol/L (ref 3.5–5.1)
Sodium: 135 mmol/L (ref 135–145)

## 2020-09-09 MED ORDER — LEVOFLOXACIN 500 MG PO TABS
500.0000 mg | ORAL_TABLET | Freq: Every day | ORAL | 0 refills | Status: AC
  Filled 2020-09-09: qty 2, 2d supply, fill #0

## 2020-09-09 MED ORDER — POTASSIUM CHLORIDE CRYS ER 20 MEQ PO TBCR
40.0000 meq | EXTENDED_RELEASE_TABLET | Freq: Once | ORAL | Status: AC
Start: 2020-09-09 — End: 2020-09-09
  Administered 2020-09-09: 40 meq via ORAL
  Filled 2020-09-09: qty 2

## 2020-09-09 NOTE — Progress Notes (Signed)
Nsg Discharge Note  Admit Date:  09/06/2020 Discharge date: 09/09/2020   [name] to be D/C'd Home per MD order.  AVS completed.     Discharge Medication: Allergies as of 09/09/2020      Reactions   Baclofen Other (See Comments)   Back pain. Patient said it made him feel crazy   Efavirenz Rash   Nevirapine Rash, Other (See Comments)      Medication List    TAKE these medications   amLODipine 10 MG tablet Commonly known as: NORVASC Take 1 tablet (10 mg total) by mouth daily. Pt needs to keep upcoming appt in March for further refills Notes to patient: Did not receive in hospital; resume as directed.   aspirin EC 81 MG tablet Take 1 tablet (81 mg total) by mouth daily. Notes to patient: Did not receive in hospital; resume as directed.   clopidogrel 75 MG tablet Commonly known as: PLAVIX Take 1 tablet by mouth once daily   finasteride 5 MG tablet Commonly known as: PROSCAR Take 5 mg by mouth daily. Notes to patient: Did not receive in hospital; resume as directed.   levofloxacin 500 MG tablet Commonly known as: LEVAQUIN Take 1 tablet (500 mg total) by mouth daily for 2 doses.   pantoprazole 20 MG tablet Commonly known as: PROTONIX Take 1 tablet (20 mg total) by mouth 2 (two) times daily. What changed: when to take this   rosuvastatin 20 MG tablet Commonly known as: CRESTOR Take 1 tablet (20 mg total) by mouth daily. What changed: when to take this   Triumeq 600-50-300 MG tablet Generic drug: abacavir-dolutegravir-lamiVUDine Take 1 tablet by mouth daily.   vitamin C 1000 MG tablet Take 1,000 mg by mouth daily at 6 PM. Notes to patient: Did not receive in hospital; resume as directed.   Vitamin D3 125 MCG (5000 UT) Tabs Take 5,000 Units by mouth daily at 6 PM. Notes to patient: Did not receive in hospital; resume as directed.   Zinc 50 MG Tabs Take 50 mg by mouth daily at 6 PM. Notes to patient: Did not receive in hospital; resume as directed.        Discharge Assessment: Vitals:   09/08/20 2107 09/09/20 0830  BP: 130/89 117/74  Pulse: 83 89  Resp: 20 18  Temp: 98.3 F (36.8 C) 97.8 F (36.6 C)  SpO2: 98% 94%   Skin clean, dry and intact without evidence of skin break down, no evidence of skin tears noted. IV catheter discontinued intact. Site without signs and symptoms of complications - no redness or edema noted at insertion site, patient denies c/o pain - only slight tenderness at site.  Dressing with slight pressure applied.  D/c Instructions-Education: Discharge instructions given to patient/family with verbalized understanding. D/c education completed with patient/family including follow up instructions, medication list, d/c activities limitations if indicated, with other d/c instructions as indicated by MD - patient able to verbalize understanding, all questions fully answered. Patient instructed to return to ED, call 911, or call MD for any changes in condition.  Patient escorted via [place], and D/C home via private auto.  Hiram Comber, RN 09/09/2020 12:26 PM

## 2020-09-09 NOTE — Discharge Summary (Signed)
[facility] Discharge Summary  Patient name: Jeffrey [name] Medical record number: [id number] Date of birth: 19-May-1945 Age: 75 y.o. Gender: male Date of Admission: 09/06/2020  Date of Discharge: 09/09/2020 Admitting Physician: Lind Covert, MD  Primary Care Provider: Danna Hefty, DO Consultants: ID  Indication for Hospitalization: Hypokalmeia and dehydration 2/2 Infectious colitis  Discharge Diagnoses/Problem List:  1.  Infectious colitis 2/2 Shigella 2.  Hypokalemia 2/2 colitis 3.  Acute on chronic kidney disease   Disposition: Home  Discharge Condition: Stable  Discharge Exam:  General: Well-developed, well-nourished male sitting comfortably in bed, NAD Cardiovascular: Regular rate and rhythm Respiratory: Clear to auscultation bilaterally Abdomen: Soft, nondistended, mildly tender to palpation in lower quadrant, BS+ Extremities: Warm and dry Psychiatric: Normal mood and affect.   Brief Hospital Course:  Infectious Colitis 2/2 Shigella COVID-19 + on 09/06/20 Patient presented with watery diarrhea, 15-20 episodes a day * 1 week. On presentation he appeared volume depleted with dry mucous membranes and was tachycardic to 103. Patient afebrile and without an elevated white count. On physical exam patient was tender to palpation in lower right and left quadrants. Labs significant for K+ of 2.6, Na 131, Cr 1.98  In ED received 3 runs of 10 mg potassium and 40 mg K-dur. Received 58m bolus x2.  C.diff  negative. CT abdomen consistent with diffuse colitis worst in the left colon and rectosigmoid without evidence of abscess or perforation. Patient COVID-19 positive which could be contributing to his diarrhea in addition to the infectious colitis.  ID did not think the diarrhea is HIV related given acute onset, and well-controlled HIV and a compliant patient, possibly COVID-related vs infectious colitis 2/2 Shigella. Received ciprofloxacin 1 dose but  discontinued by ID started on ceftriaxone and metronidazole.  When patient tested positive for Shigella, changed to oral levofloxacin.    Hypokalemia 2/2 diarrhea-Resolved K+ on admission 2.6 likely in the setting of severe diarrhea. In ED received 3 runs of 10 mg potassium and 40 mg K-dur.  Received KCL 40 mg as needed   Acute on Chronic Kidney Disease  On presentation Cr. 1.98 > 1.64. Baseline appears to be around 1.5.  It trended down and on the day of discharge 1.33   Hx CVA  TIA Patient taking  Plavix 740mdaily.  No residual deficits. Normal neuro exam.   HTN Blood pressure normotensive at 122/69 Home medication: Amlodipine 10 mg daily    GERD Follows with [facility] Home medication: Protonix 20 mg twice daily Restarted on Protonix 20 mg once daily    CAD, HLD Known CAD with carotid artery stenosis. LDL 113 in 2018 Home medication: Crestor 20 mg daily    HIV HIV viral load undetectable 1 year ago.  Home medication: abacavir-dolutegravir-lamivudine. [facility], was 469 on 10/21. HIV 1 RNA Quant undetectable on 09/07/20 and was continued on Triumeq     Issues for Follow Up:  1. BMP on next PCP appointment  Significant Procedures: None  Significant Labs and Imaging:  Recent Labs  Lab 09/07/20 0130 09/08/20 0145 09/09/20 0130  WBC 7.5 7.9 7.2  HGB 11.3* 11.4* 10.6*  HCT 32.6* 33.5* 31.4*  PLT 231 233 240   Recent Labs  Lab 09/06/20 1150 09/06/20 2048 09/07/20 0130 09/08/20 0145 09/09/20 0130  NA 131* 134* 129* 134* 135  K 2.6* 4.3 3.1* 3.1* 3.5  CL 94* 101 100 107 108  CO2 25 20* 20* 21* 20*  GLUCOSE 133* 99 99 101* 102*  BUN 31* 27* 23 19 14   CREATININE 1.98* 1.64* 1.56* 1.38* 1.33*  CALCIUM 8.4* 8.0* 7.6* 7.5* 7.7*  MG  --   --  2.2  --   --   ALKPHOS 63  --   --   --   --   AST 25  --   --   --   --   ALT 25  --   --   --   --   ALBUMIN 2.5*  --   --   --   --     Results/Tests Pending at Time of Discharge: CT  Chest W Contrast  Result Date: 08/05/2020 CLINICAL DATA:  Chest wall pain.  Left-sided rib pain for 3 months. EXAM: CT CHEST WITH CONTRAST TECHNIQUE: Multidetector CT imaging of the chest was performed during intravenous contrast administration. CONTRAST:  82m ISOVUE-300 IOPAMIDOL (ISOVUE-300) INJECTION 61% COMPARISON:  Chest radiograph from 05/30/2019. FINDINGS: Cardiovascular: Heart size appears upper limits of normal. No pericardial effusion. Coronary artery calcifications identified, including calcification within the proximal LAD coronary artery, image 77/2. Mediastinum/Nodes: Normal appearance of the thyroid gland. The trachea appears patent and is midline. Normal appearance of the esophagus. No axillary, supraclavicular, mediastinal, or hilar adenopathy. Lungs/Pleura: Mild bilateral cylindrical type bronchiectasis. Peripheral predominant interstitial reticulation is identified within both lung bases. No superimposed pleural effusion, airspace consolidation or atelectasis. Upper Abdomen: No acute findings within the imaged portions of the upper abdomen. Previous cholecystectomy. Musculoskeletal: No chest wall abnormality. No acute or significant osseous findings. A fiducial marker was placed over the lower lateral left chest wall indicating area of concern. No underlying rib deformity identified. No soft tissue mass or fluid collection identified. IMPRESSION: 1. No acute findings and no explanation for patient's chest wall pain. 2. Mild chronic interstitial changes identified within the lungs including cylindrical bronchiectasis and peripheral interstitial reticulation. No signs of acute cardiopulmonary abnormality noted. 3. Coronary artery calcifications including calcifications in the LAD coronary artery Electronically Signed   By: TKerby MoorsM.D.   On: 08/05/2020 10:51   CT ABDOMEN PELVIS W CONTRAST  Result Date: 09/06/2020 CLINICAL DATA:  Acute abdominal pain for several days EXAM: CT ABDOMEN AND  PELVIS WITH CONTRAST TECHNIQUE: Multidetector CT imaging of the abdomen and pelvis was performed using the standard protocol following bolus administration of intravenous contrast. CONTRAST:  871mOMNIPAQUE IOHEXOL 300 MG/ML  SOLN COMPARISON:  09/06/2017 FINDINGS: Lower chest: Minimal scarring is noted in the bases bilaterally. Hepatobiliary: Gallbladder has been surgically removed. Mild prominence of the biliary tree is noted due to the post cholecystectomy state. The liver is well visualized and within normal limits. Pancreas: Unremarkable. No pancreatic ductal dilatation or surrounding inflammatory changes. Spleen: Normal in size without focal abnormality. Adrenals/Urinary Tract: Adrenal glands are within normal limits. Kidneys demonstrate a normal enhancement pattern. No renal calculi or obstructive changes are seen. Normal excretion is noted on delayed images. Bladder is partially distended. Stomach/Bowel: Wall thickening and edema is noted within the rectosigmoid and to a lesser degree throughout the remainder of the colon. These changes are consistent with diffuse colitis and are new from the prior exam. No perforation or abscess formation is noted. The appendix is within normal limits. Small bowel and stomach are unremarkable. Vascular/Lymphatic: No significant vascular findings are present. No enlarged abdominal or pelvic lymph nodes. Reproductive: Prostate is unremarkable. Other: Fat containing left inguinal hernia is noted. Musculoskeletal: Degenerative changes of lumbar spine are noted. IMPRESSION: Changes consistent with  diffuse colitis worst in the left colon and rectosigmoid without evidence of abscess or perforation. This corresponds with the given clinical history of diarrhea and likely represents an infectious colitis. Status post cholecystectomy. Electronically Signed   By: Inez Catalina M.D.   On: 09/06/2020 14:22    Discharge Medications:  Allergies as of 09/09/2020      Reactions   Baclofen  Other (See Comments)   Back pain. Patient said it made him feel crazy   Efavirenz Rash   Nevirapine Rash, Other (See Comments)      Medication List    TAKE these medications   amLODipine 10 MG tablet Commonly known as: NORVASC Take 1 tablet (10 mg total) by mouth daily. Pt needs to keep upcoming appt in March for further refills   aspirin EC 81 MG tablet Take 1 tablet (81 mg total) by mouth daily.   clopidogrel 75 MG tablet Commonly known as: PLAVIX Take 1 tablet by mouth once daily   finasteride 5 MG tablet Commonly known as: PROSCAR Take 5 mg by mouth daily.   levofloxacin 500 MG tablet Commonly known as: LEVAQUIN Take 1 tablet (500 mg total) by mouth daily for 2 doses.   pantoprazole 20 MG tablet Commonly known as: PROTONIX Take 1 tablet (20 mg total) by mouth 2 (two) times daily. What changed: when to take this   rosuvastatin 20 MG tablet Commonly known as: CRESTOR Take 1 tablet (20 mg total) by mouth daily. What changed: when to take this   Triumeq 600-50-300 MG tablet Generic drug: abacavir-dolutegravir-lamiVUDine Take 1 tablet by mouth daily.   vitamin C 1000 MG tablet Take 1,000 mg by mouth daily at 6 PM.   Vitamin D3 125 MCG (5000 UT) Tabs Take 5,000 Units by mouth daily at 6 PM.   Zinc 50 MG Tabs Take 50 mg by mouth daily at 6 PM.       Discharge Instructions: Please refer to Patient Instructions section of EMR for full details.  Patient was counseled important signs and symptoms that should prompt return to medical care, changes in medications, dietary instructions, activity restrictions, and follow up appointments.   Follow-Up Appointments:   On 09/13/2020 with Dr. Tarry Kos @ 3:10 pm  Barrie Sigmund, Meredith Staggers, MD 09/09/2020, 12:06 PM PGY-1, [place]

## 2020-09-09 NOTE — Discharge Instructions (Signed)
Dear Mr. Jeffrey [name],  You presented with diarrhea and we found that a bug called Shigella is causing it.  You received antibiotics in hospital and please continue taking it for next 2 days.  Levofloxacin 500 mg on 09/10/2020 and 09/11/2020.  Please see your PCP on Monday at 3:10pm.  It was pleasure taking care of you! Dr. Demaris Callander

## 2020-09-09 NOTE — Consult Note (Signed)
   [facility] Inpatient Consult   09/09/2020  Jeffrey [name] [birth year] [id number]  [facility] [employer] [ACO] Patient: [name]  Patient was screened for [place] Management services. Patient will have the transition of care call conducted by the primary care provider. This patient is also in an Embedded practice which has a chronic disease management Embedded Care Management team.  Plan: Patient to be referred to Embedded CCM program for chronic care management follow up. High risk for unplanned readmission.  Please contact for further questions,  Natividad Brood, RN BSN [facility] Liaison  [phone] business mobile phone Toll free office [phone]  Fax number: [phone] [email or web address]  .

## 2020-09-10 ENCOUNTER — Telehealth: Payer: Self-pay | Admitting: *Deleted

## 2020-09-10 NOTE — Chronic Care Management (AMB) (Signed)
  Care Management   Note  09/10/2020 Name: Jeffrey [name] MRN: [id number] DOB: [birth year]  Jeffrey [name] is a 75 y.o. year old male who is a primary care patient of Danna Hefty, DO. I reached out to Jeffrey [name] by phone today in response to a referral sent by Mr. Kayin Osment Cham's PCP, Danna Hefty, DO.    Mr. Habib was given information about care management services today including:  1. Care management services include personalized support from designated clinical staff supervised by his physician, including individualized plan of care and coordination with other care providers 2. 24/7 contact phone numbers for assistance for urgent and routine care needs. 3. The patient may stop care management services at any time by phone call to the office staff.  Patient agreed to services and verbal consent obtained.   Follow up plan: Telephone appointment with care management team member scheduled for:09/15/2020  [place] Management

## 2020-09-10 NOTE — Chronic Care Management (AMB) (Signed)
  Care Management   Outreach Note  09/10/2020 Name: SHAYAN BRAMHALL MRN: [id number] DOB: 1946/01/05  Referred by: Danna Hefty, DO Reason for referral : Care Coordination (Initial outreach to schedule referral with RNCM was unsuccessful)   An unsuccessful telephone outreach was attempted today. The patient was referred to the case management team for assistance with care management and care coordination.   Follow Up Plan: A HIPAA compliant phone message was left for the patient providing contact information and requesting a return call. The care management team will reach out to the patient again over the next 7 days. If patient returns call to provider office, please advise to call [place] at [phone].  [facility] Management

## 2020-09-13 ENCOUNTER — Other Ambulatory Visit: Payer: Self-pay

## 2020-09-13 ENCOUNTER — Inpatient Hospital Stay: Payer: Medicare HMO | Admitting: Family Medicine

## 2020-09-13 ENCOUNTER — Ambulatory Visit (INDEPENDENT_AMBULATORY_CARE_PROVIDER_SITE_OTHER): Payer: Medicare HMO | Admitting: Family Medicine

## 2020-09-13 VITALS — BP 121/73 | HR 89 | Temp 98.1°F | Ht 70.0 in

## 2020-09-13 DIAGNOSIS — A09 Infectious gastroenteritis and colitis, unspecified: Secondary | ICD-10-CM

## 2020-09-13 DIAGNOSIS — N179 Acute kidney failure, unspecified: Secondary | ICD-10-CM

## 2020-09-13 DIAGNOSIS — D649 Anemia, unspecified: Secondary | ICD-10-CM | POA: Diagnosis not present

## 2020-09-13 NOTE — Assessment & Plan Note (Signed)
Hospital follow-up for Shigella colitis.  No longer having diarrhea.  No abdominal pain.  We will get BMP for rechecking kidney function as patient had an AKI on admission.  Also had slowly decreasing hemoglobin (likely delusional) so we will get CBC.  Advised patient to follow-up with PCP for annual physical soon.

## 2020-09-13 NOTE — Progress Notes (Signed)
    SUBJECTIVE:   CHIEF COMPLAINT / HPI:   Hospital f/u: Patient is here for hospital follow-up of diarrhea.  His diarrhea has resolved since discharge.  He had 1 bowel movement yesterday and 1 today.  They were soft, but not diarrhea.  No abdominal pain.  Patient still states he has not regained his sense of smell or taste since initially having COVID a year ago.  Recently diagnosed with COVID again on admission to the hospital.  Complains of brain fog leg symptoms (losing his motivation to do things) and some lingering decreased cardiovascular capacity.   PERTINENT  PMH / PSH: COVID, Shigella colitis HIV  OBJECTIVE:   BP 121/73   Pulse 89   Temp 98.1 F (36.7 C) (Oral)   Ht 5' 10"  (1.778 m)   SpO2 99%   BMI 22.52 kg/m   Dental: Alert and oriented.  No acute distress. CV: Regular rate and rhythm, no murmurs Pulmonary: Lungs clear to auscultation bilaterally, no wheezes or crackles. GI: Soft, nontender.  Normal bowel sounds.  ASSESSMENT/PLAN:   Diarrhea Hospital follow-up for Shigella colitis.  No longer having diarrhea.  No abdominal pain.  We will get BMP for rechecking kidney function as patient had an AKI on admission.  Also had slowly decreasing hemoglobin (likely delusional) so we will get CBC.  Advised patient to follow-up with PCP for annual physical soon.     Benay Pike, MD [place]

## 2020-09-13 NOTE — Patient Instructions (Signed)
It was nice to see you today,  We will get some blood work today.  If they are abnormal I will follow-up the results with you when I get them.  Please schedule appointment with your PCP, Dr. Tarry Kos, at your earliest convenience.  Have a great day,  Clemetine Marker, MD

## 2020-09-14 LAB — CBC
Hematocrit: 36.5 % — ABNORMAL LOW (ref 37.5–51.0)
Hemoglobin: 12.2 g/dL — ABNORMAL LOW (ref 13.0–17.7)
MCH: 32.3 pg (ref 26.6–33.0)
MCHC: 33.4 g/dL (ref 31.5–35.7)
MCV: 97 fL (ref 79–97)
Platelets: 368 10*3/uL (ref 150–450)
RBC: 3.78 x10E6/uL — ABNORMAL LOW (ref 4.14–5.80)
RDW: 13.7 % (ref 11.6–15.4)
WBC: 6.3 10*3/uL (ref 3.4–10.8)

## 2020-09-14 LAB — BASIC METABOLIC PANEL
BUN/Creatinine Ratio: 8 — ABNORMAL LOW (ref 10–24)
BUN: 11 mg/dL (ref 8–27)
CO2: 24 mmol/L (ref 20–29)
Calcium: 8.8 mg/dL (ref 8.6–10.2)
Chloride: 102 mmol/L (ref 96–106)
Creatinine, Ser: 1.31 mg/dL — ABNORMAL HIGH (ref 0.76–1.27)
Glucose: 105 mg/dL — ABNORMAL HIGH (ref 65–99)
Potassium: 4 mmol/L (ref 3.5–5.2)
Sodium: 139 mmol/L (ref 134–144)
eGFR: 58 mL/min/{1.73_m2} — ABNORMAL LOW (ref >59)

## 2020-09-15 ENCOUNTER — Ambulatory Visit: Payer: Medicare HMO

## 2020-09-17 NOTE — Chronic Care Management (AMB) (Signed)
09/17/2020  KENDERICK KOBLER [birth year] [id number]   Care Management   RN Case Manager InitialNote  09/17/2020 Late Entry Name: CHANG TIGGS MRN: [id number] DOB: 1945-05-28 Jeffrey [name] is a 75 y.o. year old male who sees Danna Hefty, DO for primary care.  Patient is enrolled in a Managed Medicaid plan: No.  The Care Management team was consulted by [facility] Liaision to assist the patient with. Disease Management Educational Needs.   RNCM engaged with Jeffrey [name] today Engaged with patient by telephone  in response to referral for RN case management and/or care coordination services. See care plan below for details during this encounter.  The patient was referred to [facility] by [facility] Liaison for follow up and assessment after recent hospital stay 09/06/20-09/09/20.  After speaking with the patient, he stated that he felt he has his chronic conditions under control, He stated he takes his medication (which we reviewed) religiously.  He had no other needs and did not wish to be in the program currently. His  one concern that he plans to speak with the physician about is having some shortness of breath and confusion about doing certain task that he did not have problems with before.  He stated that all of this started after he had Covid in February of 2021. He has been dealing with this for a long time and it has not gotten better. He is scheduled to have a Physical on the 17 th of May at 930 am with Dr Ardelia Mems.  I advised him that I would inform her so she would be aware.   Follow Up Plan: If further intervention is needed the care management team is available to follow up after a formal CCM referral is placed   Advanced Directives Status:Not addressed in this encounter.     SDOH (Social Determinants of Health) assessments performed: No        Outpatient Encounter Medications as of 09/15/2020  Medication Sig Note  . abacavir-dolutegravir-lamiVUDine (TRIUMEQ) 600-50-300 MG tablet  Take 1 tablet by mouth daily.   Marland Kitchen amLODipine (NORVASC) 10 MG tablet Take 1 tablet (10 mg total) by mouth daily. Pt needs to keep upcoming appt in March for further refills   . Ascorbic Acid (VITAMIN C) 1000 MG tablet Take 1,000 mg by mouth daily at 6 PM.   . aspirin EC 81 MG tablet Take 1 tablet (81 mg total) by mouth daily.   . Cholecalciferol (VITAMIN D3) 125 MCG (5000 UT) TABS Take 5,000 Units by mouth daily at 6 PM.   . clopidogrel (PLAVIX) 75 MG tablet Take 1 tablet by mouth once daily   . finasteride (PROSCAR) 5 MG tablet Take 5 mg by mouth daily.   . pantoprazole (PROTONIX) 20 MG tablet Take 1 tablet (20 mg total) by mouth 2 (two) times daily. (Patient taking differently: Take 20 mg by mouth daily.) 09/15/2020: Patient takes it one time daily   . rosuvastatin (CRESTOR) 20 MG tablet Take 1 tablet (20 mg total) by mouth daily. (Patient taking differently: Take 20 mg by mouth daily at 6 PM.)   . vitamin B-12 (CYANOCOBALAMIN) 500 MCG tablet Take 500 mcg by mouth daily.   . Zinc 50 MG TABS Take 50 mg by mouth daily at 6 PM.    No facility-administered encounter medications on file as of 09/15/2020.    Review of patient status, including review of consultants reports, relevant laboratory and other test results, and collaboration with appropriate care team members  and the patient's provider was performed as part of comprehensive patient evaluation and provision of chronic care management services.       Information about Care Management services was shared with Mr.  Menken today including:  1. Care Management services include personalized support from designated clinical staff supervised by his physician, including individualized plan of care and coordination with other care providers 2. Remind patient of 24/7 contact phone numbers to provider's office for assistance with urgent and routine care needs. 3. Care Management services are voluntary and patient may stop at any time .    Lazaro Arms RN,  BSN, [facility] Care Management Coordinator [place] Phone: [phone] I Fax: [phone]

## 2020-09-20 NOTE — Progress Notes (Signed)
CARDIOLOGY CONSULT NOTE       Patient ID: Jeffrey [name] MRN: [id number] DOB/AGE: February 07, 1946 75 y.o.  Admit date: (Not on file) Referring Physician: Marcella Dubs  Primary Physician: Danna Hefty, DO Primary Cardiologist: New Reason for Consultation: Dyspnea/HTN    HPI: 75 y.o. seen for f/u ? Diastolic dysfunction , dyspnea chest pain. History of HIV Echo 03/05/19 with EF 55-60% only impaired relaxation no significant valve disease. Cardiac CTA 03/21/19 calcium score 122 45 th percentile no obstructive disease BP improved on norvasc April 2021 had paresthesias left arm and abnormal gait. Resulted in intervention by Dr Estanislado Pandy to right supraclinoid carotid COVID positive 05/30/19 Rx with oxygen and steroids  Continues to have some cognitive decline since COVID Continues to have some dyspnea Non contrast CT 08/04/20 with some chronic interstitial Changes with peripheral reticulation and bronchiectasis   Hospitalized 09/09/20 with diarrhea and colitis tested positive for Shigella and Rx cipro Tested positive for COVID again this admission   Doing some better. Lives alone. 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 Chronic kidney disease   . Colon polyps   . COVID-19    05/2019  . GERD (gastroesophageal reflux disease)   . Heart murmur   . History of kidney stones   . HIV (human immunodeficiency virus infection) ([place]) 1999  . Hypertension   . Pneumonia   . Stroke [facility])    right side weakness  . TIA (transient ischemic attack)     Family History  Problem Relation Age of Onset  . Hypertension Mother   . Heart attack Mother 17  . Colon cancer Neg Hx   . Esophageal cancer Neg Hx   . Pancreatic cancer Neg Hx   . Stomach cancer Neg Hx   . Liver disease Neg Hx     Social History   Socioeconomic History  . Marital status: Divorced     Spouse name: Not on file  . Number of children: Not on file  . Years of education: Not on file  . Highest education level: Not on file  Occupational History  . Occupation: retired    Fish farm manager: [employer]  Tobacco Use  . Smoking status: Never Smoker  . Smokeless tobacco: Former Systems developer    Types: Chew  . Tobacco comment: Rare use, about q 6 months  Vaping Use  . Vaping Use: Never used  Substance and Sexual Activity  . Alcohol use: Yes    Comment: rare  . Drug use: No  . Sexual activity: Yes    Partners: Female    Comment: declined condoms  Other Topics Concern  . Not on file  Social History Narrative   Right handed    Caffeine use: tea, mainly water   Social Determinants of Health   Financial Resource Strain: Not on file  Food Insecurity: Not on file  Transportation Needs: Not on file  Physical Activity: Not on file  Stress: Not on file  Social Connections: Not on file  Intimate Partner Violence: Not on file    Past Surgical History:  Procedure Laterality Date  . CHOLECYSTECTOMY  1982  . COLONOSCOPY    . ESOPHAGEAL MANOMETRY N/A 09/03/2017   Procedure: ESOPHAGEAL MANOMETRY (EM);  Surgeon: Mauri Pole, MD;  Location: [facility];  Service: Endoscopy;  Laterality: N/A;  . IR ANGIO INTRA EXTRACRAN  SEL COM CAROTID INNOMINATE BILAT MOD SED  04/21/2019  . IR ANGIO INTRA EXTRACRAN SEL COM CAROTID INNOMINATE BILAT MOD SED  05/11/2020  . IR ANGIO VERTEBRAL SEL VERTEBRAL BILAT MOD SED  04/21/2019  . IR ANGIO VERTEBRAL SEL VERTEBRAL BILAT MOD SED  05/11/2020  . IR PTA INTRACRANIAL  05/14/2019  . IR US GUIDE VASC ACCESS RIGHT  05/11/2020  . KNEE SURGERY     LEFT KNEE  . RADIOLOGY WITH ANESTHESIA N/A 05/14/2019   Procedure: IR WITH ANESTHESIA STENTING;  Surgeon: Luanne Bras, MD;  Location: [facility];  Service: Radiology;  Laterality: N/A;  . TONSILLECTOMY          Physical Exam: There were no vitals taken for this visit.   Affect appropriate Healthy:  appears stated  age 68: normal Neck supple with no adenopathy JVP normal no bruits no thyromegaly Lungs clear with no wheezing and good diaphragmatic motion Heart:  S1/S2 no murmur, no rub, gallop or click PMI normal Abdomen: benighn, BS positve, no tenderness, no AAA no bruit.  No HSM or HJR Distal pulses intact with no bruits No edema Neuro non-focal Skin warm and dry No muscular weakness   Labs:   Lab Results  Component Value Date   WBC 6.3 09/13/2020   HGB 12.2 (L) 09/13/2020   HCT 36.5 (L) 09/13/2020   MCV 97 09/13/2020   PLT 368 09/13/2020    No results for input(s): NA, K, CL, CO2, BUN, CREATININE, CALCIUM, PROT, BILITOT, ALKPHOS, ALT, AST, GLUCOSE in the last 168 hours.  Invalid input(s): LABALBU Lab Results  Component Value Date   CKTOTAL 51 04/12/2010   CKMB 1.6 04/12/2010   TROPONINI <0.01        NO INDICATION OF MYOCARDIAL INJURY. 04/12/2010    Lab Results  Component Value Date   CHOL 119 02/10/2020   CHOL 139 01/26/2020   CHOL 147 10/27/2019   Lab Results  Component Value Date   HDL 45 02/10/2020   HDL 54 01/26/2020   HDL 43 10/27/2019   Lab Results  Component Value Date   LDLCALC 53 02/10/2020   LDLCALC 65 01/26/2020   LDLCALC 82 10/27/2019   Lab Results  Component Value Date   TRIG 128 02/10/2020   TRIG 112 01/26/2020   TRIG 121 10/27/2019   Lab Results  Component Value Date   CHOLHDL 2.6 02/10/2020   CHOLHDL 2.6 01/26/2020   CHOLHDL 3.4 10/27/2019   No results found for: LDLDIRECT    Radiology: CT ABDOMEN PELVIS W CONTRAST  Result Date: 09/06/2020 CLINICAL DATA:  Acute abdominal pain for several days EXAM: CT ABDOMEN AND PELVIS WITH CONTRAST TECHNIQUE: Multidetector CT imaging of the abdomen and pelvis was performed using the standard protocol following bolus administration of intravenous contrast. CONTRAST:  42m OMNIPAQUE IOHEXOL 300 MG/ML  SOLN COMPARISON:  09/06/2017 FINDINGS: Lower chest: Minimal scarring is noted in the bases  bilaterally. Hepatobiliary: Gallbladder has been surgically removed. Mild prominence of the biliary tree is noted due to the post cholecystectomy state. The liver is well visualized and within normal limits. Pancreas: Unremarkable. No pancreatic ductal dilatation or surrounding inflammatory changes. Spleen: Normal in size without focal abnormality. Adrenals/Urinary Tract: Adrenal glands are within normal limits. Kidneys demonstrate a normal enhancement pattern. No renal calculi or obstructive changes are seen. Normal excretion is noted on delayed images. Bladder is partially distended. Stomach/Bowel: Wall thickening and edema is noted within the rectosigmoid and to a lesser degree throughout the remainder of the colon. These  changes are consistent with diffuse colitis and are new from the prior exam. No perforation or abscess formation is noted. The appendix is within normal limits. Small bowel and stomach are unremarkable. Vascular/Lymphatic: No significant vascular findings are present. No enlarged abdominal or pelvic lymph nodes. Reproductive: Prostate is unremarkable. Other: Fat containing left inguinal hernia is noted. Musculoskeletal: Degenerative changes of lumbar spine are noted. IMPRESSION: Changes consistent with diffuse colitis worst in the left colon and rectosigmoid without evidence of abscess or perforation. This corresponds with the given clinical history of diarrhea and likely represents an infectious colitis. Status post cholecystectomy. Electronically Signed   By: Inez Catalina M.D.   On: 09/06/2020 14:22    EKG: SR rate 61 normal 01/21/19    ASSESSMENT AND PLAN:   1. Dyspnea:  Post COVID with some interstitial lung changes no evidence of cardiac etiology 2. Chest Pain: non cardiac cardiac CTA 03/21/19 non obstructive CAD  3. HIV:  Continue anti-retroviral Rx f/u Dr Wendie Simmer ID RX Triumeq 4. Prostate:  On proscar f/u PSA with primary  5. TIA:  In April subsequent IR intervention to right  intracranial ICA by Dr Sallyanne Havers 05/14/19 continue DAT with Plavix  6. HTN:  Improved with norvasc   7. HLD   Continue statin LDL 52 02/10/20  8. COVID: positive 05/30/19 and 09/07/20  CXR with faint bilateral patchy lung opacities Rx as outpatient had deltasone BNP and troponin negative Mild elevation in inflammatory markersF/u with primary seems to have some permanent interstitial changes contributing to dyspnea as well as cognitive decline   F/U in a year     Signed: Jenkins Rouge 09/27/2020, 8:15 AM

## 2020-09-21 ENCOUNTER — Other Ambulatory Visit: Payer: Self-pay

## 2020-09-21 ENCOUNTER — Ambulatory Visit (INDEPENDENT_AMBULATORY_CARE_PROVIDER_SITE_OTHER): Payer: Medicare HMO | Admitting: Family Medicine

## 2020-09-21 ENCOUNTER — Encounter: Payer: Self-pay | Admitting: Family Medicine

## 2020-09-21 VITALS — BP 110/72 | HR 85 | Wt 161.6 lb

## 2020-09-21 DIAGNOSIS — R809 Proteinuria, unspecified: Secondary | ICD-10-CM

## 2020-09-21 DIAGNOSIS — A539 Syphilis, unspecified: Secondary | ICD-10-CM

## 2020-09-21 DIAGNOSIS — R351 Nocturia: Secondary | ICD-10-CM

## 2020-09-21 DIAGNOSIS — G5712 Meralgia paresthetica, left lower limb: Secondary | ICD-10-CM | POA: Diagnosis not present

## 2020-09-21 DIAGNOSIS — Z113 Encounter for screening for infections with a predominantly sexual mode of transmission: Secondary | ICD-10-CM | POA: Diagnosis not present

## 2020-09-21 DIAGNOSIS — R3581 Nocturnal polyuria: Secondary | ICD-10-CM | POA: Diagnosis not present

## 2020-09-21 LAB — POCT GLYCOSYLATED HEMOGLOBIN (HGB A1C): Hemoglobin A1C: 5.5 % (ref 4.0–5.6)

## 2020-09-21 LAB — POCT URINALYSIS DIP (MANUAL ENTRY)
Bilirubin, UA: NEGATIVE
Blood, UA: NEGATIVE
Glucose, UA: NEGATIVE mg/dL
Ketones, POC UA: NEGATIVE mg/dL
Leukocytes, UA: NEGATIVE
Nitrite, UA: NEGATIVE
Protein Ur, POC: 100 mg/dL — AB
Spec Grav, UA: 1.025 (ref 1.010–1.025)
Urobilinogen, UA: 0.2 E.U./dL
pH, UA: 5.5 (ref 5.0–8.0)

## 2020-09-21 LAB — POCT UA - MICROSCOPIC ONLY

## 2020-09-21 NOTE — Assessment & Plan Note (Addendum)
Treated in February for syphilis (last sexual encounter was in 2018). Guidelines suggest following titers at 3, 6, 9, 12, and 24 months post treatment. No new rashes. Patient context includes HIV.  Will collect 3 month RPR titer today  RTC in July to meet new PCP

## 2020-09-21 NOTE — Assessment & Plan Note (Signed)
Follows with urology annually in October for BPH- has been stable on Finasteride. No red flag symptoms. Last A1C normal and no polydipsia.   Will check Urinalysis to rule out infection If negative, recommend reaching out to Dr. Diona Fanti in Urology

## 2020-09-21 NOTE — Patient Instructions (Addendum)
See handout on your thigh - meralgia paresthetica  No signs of infection on urine today, but do see protein Checking some additional labs, also for syphilis  Recommend following up with urology for the extra urination at night  Schedule an appointment in July with your new doctor to meet them  Be well, Dr. Ardelia Mems

## 2020-09-21 NOTE — Assessment & Plan Note (Signed)
Classic distribution and history in this patient with weight loss after shigella colitis admission who has been wearing his belt more tightly in the last two weeks. No new weakness, back pain, genital numbness or incontinence.  Recommend loosening belt and wearing loose fitting clothing with elastic bands.

## 2020-09-21 NOTE — Progress Notes (Signed)
SUBJECTIVE:   CHIEF COMPLAINT / HPI:   Jeffrey [name] returns today to discuss frequent urination and L leg numbness since his hospital discharge on 09/09/20 for shigella colitis.  Frequent Urination [name] notes that he has been urinating up to 4-5 times per night since his hospital discharge on 09/09/20. Prior to this he had been urinating 0-2 time per night. He is urinating 4-5 times during the day as well. Last consumes water around 8pm before going to bed at 11:30pm. Drinks 7-9 glasses of water during the day, no other beverages. Denies back pain, suprapubic pain, dysuria, blood in the urine, fevers, or excessive thirst.   Lateral Leg Numbness The pt also notes that he has had lateral leg numbness in his left thigh since a day or two after his discharge. Describes numbness as superficial, constant, and occasionally feels like pins and needles, without being overtly painful. He lost some weight after his hospital admission and has been wearing his belt more tightly. Denies back pain, new left leg weakness (has history of chronic left sided weakness after previous stroke), right leg symptoms, genital numbness, or incontinence.  Syphilis The pt was treated with Penicillin in February of this year for primary syphilis with anal sores. Denies being sexually active since 2018. Guidelines suggest monitoring titers at 3, 6, 9, 12 and 24 months post treatment given his + HIV status. He had a negative RPR within the last year prior to testing positive.  OBJECTIVE:   BP 110/72   Pulse 85   Wt 161 lb 9.6 oz (73.3 kg)   SpO2 94%   BMI 23.19 kg/m   Physical Exam Vitals reviewed.  Constitutional:      Appearance: Normal appearance.  Cardiovascular:     Rate and Rhythm: Normal rate and regular rhythm.     Heart sounds: Normal heart sounds.  Pulmonary:     Effort: Pulmonary effort is normal.     Breath sounds: Normal breath sounds.  Abdominal:     Palpations: Abdomen is soft.      Tenderness: There is no abdominal tenderness. There is no right CVA tenderness or left CVA tenderness.  Skin:    General: Skin is warm and dry.  Neurological:     Mental Status: He is alert and oriented to person, place, and time.  Psychiatric:        Mood and Affect: Mood normal.        Behavior: Behavior normal.      ASSESSMENT/PLAN:   Health maintenance - advised he is due for colonoscopy, he will contact GI office - encouraged COVID booster, he will get at pharmacy (prefers [facility] and we only have pfizer here)  Nocturnal polyuria Follows with urology annually in October for BPH- has been stable on Finasteride. No red flag symptoms. Last A1C normal and no polydipsia.   Will check Urinalysis to rule out infection If negative, recommend reaching out to Dr. Diona Fanti in Urology  Meralgia paresthetica, left lower limb Classic distribution and history in this patient with weight loss after shigella colitis admission who has been wearing his belt more tightly in the last two weeks. No new weakness, back pain, genital numbness or incontinence.  Recommend loosening belt and wearing loose fitting clothing with elastic bands.  Syphilis (acquired) Treated in February for syphilis (last sexual encounter was in 2018). Guidelines suggest following titers at 3, 6, 9, 12, and 24 months post treatment. No new rashes. Patient context includes HIV.  Will  collect 3 month RPR titer today  RTC in July to meet new PCP  Proteinuria UA with persistent proteinuria, noted on prior UAs as well. Check A1c and UPC ratio. May need nephrology referral.     Jeffrey [name], Jeffrey [name]    Patient seen along with MS3 student Jeffrey [name]. I personally evaluated this patient along with the student, and verified all aspects of the history, physical exam, and medical decision making as documented by the student. I agree with the student's documentation and have  made all necessary edits.  Chrisandra Netters, MD  [place]

## 2020-09-22 LAB — PROTEIN / CREATININE RATIO, URINE
Creatinine, Urine: 124.1 mg/dL
Protein, Ur: 60.6 mg/dL
Protein/Creat Ratio: 488 mg/g creat — ABNORMAL HIGH (ref 0–200)

## 2020-09-23 DIAGNOSIS — R809 Proteinuria, unspecified: Secondary | ICD-10-CM | POA: Insufficient documentation

## 2020-09-23 LAB — RPR: RPR Ser Ql: REACTIVE — AB

## 2020-09-23 LAB — RPR, QUANT+TP ABS (REFLEX)
Rapid Plasma Reagin, Quant: 1:1 {titer} — ABNORMAL HIGH
T Pallidum Abs: REACTIVE — AB

## 2020-09-23 NOTE — Assessment & Plan Note (Signed)
UA with persistent proteinuria, noted on prior UAs as well. Check A1c and UPC ratio. May need nephrology referral.

## 2020-09-27 ENCOUNTER — Encounter: Payer: Self-pay | Admitting: Cardiovascular Disease

## 2020-09-27 ENCOUNTER — Ambulatory Visit (INDEPENDENT_AMBULATORY_CARE_PROVIDER_SITE_OTHER): Payer: Medicare HMO | Admitting: Cardiovascular Disease

## 2020-09-27 ENCOUNTER — Other Ambulatory Visit: Payer: Self-pay

## 2020-09-27 VITALS — BP 110/78 | HR 73 | Ht 70.0 in | Wt 162.0 lb

## 2020-09-27 DIAGNOSIS — I2510 Atherosclerotic heart disease of native coronary artery without angina pectoris: Secondary | ICD-10-CM | POA: Diagnosis not present

## 2020-09-27 DIAGNOSIS — Z7189 Other specified counseling: Secondary | ICD-10-CM | POA: Diagnosis not present

## 2020-09-27 DIAGNOSIS — G459 Transient cerebral ischemic attack, unspecified: Secondary | ICD-10-CM

## 2020-09-27 NOTE — Patient Instructions (Signed)
Medication Instructions:  NO CHANGES *If you need a refill on your cardiac medications before your next appointment, please call your pharmacy*   Lab Work: NONE If you have labs (blood work) drawn today and your tests are completely normal, you will receive your results only by: Marland Kitchen MyChart Message (if you have MyChart) OR . A paper copy in the mail If you have any lab test that is abnormal or we need to change your treatment, we will call you to review the results.   Testing/Procedures: NONE   Follow-Up: At [facility], you and your health needs are our priority.  As part of our continuing mission to provide you with exceptional heart care, we have created designated Provider Care Teams.  These Care Teams include your primary Cardiologist (physician) and Advanced Practice Providers (APPs -  Physician Assistants and Nurse Practitioners) who all work together to provide you with the care you need, when you need it.  We recommend signing up for the patient portal called "MyChart".  Sign up information is provided on this After Visit Summary.  MyChart is used to connect with patients for Virtual Visits (Telemedicine).  Patients are able to view lab/test results, encounter notes, upcoming appointments, etc.  Non-urgent messages can be sent to your provider as well.   To learn more about what you can do with MyChart, go to [email or web address].    Your next appointment:   12 month(s)  The format for your next appointment:   In Person  Provider:   Jenkins Rouge, MD   Other Instructions NONE

## 2020-09-28 ENCOUNTER — Other Ambulatory Visit: Payer: Self-pay | Admitting: Cardiovascular Disease

## 2020-09-28 DIAGNOSIS — R351 Nocturia: Secondary | ICD-10-CM | POA: Diagnosis not present

## 2020-09-28 DIAGNOSIS — N401 Enlarged prostate with lower urinary tract symptoms: Secondary | ICD-10-CM | POA: Diagnosis not present

## 2020-09-28 DIAGNOSIS — R3915 Urgency of urination: Secondary | ICD-10-CM | POA: Diagnosis not present

## 2020-10-05 ENCOUNTER — Other Ambulatory Visit: Payer: Self-pay | Admitting: Gastroenterology

## 2020-10-12 ENCOUNTER — Other Ambulatory Visit: Payer: Self-pay | Admitting: Cardiovascular Disease

## 2020-10-15 ENCOUNTER — Telehealth: Payer: Self-pay | Admitting: Family Medicine

## 2020-10-15 ENCOUNTER — Ambulatory Visit: Payer: Medicare HMO | Admitting: Licensed Clinical Social Worker

## 2020-10-15 ENCOUNTER — Telehealth: Payer: Self-pay | Admitting: Licensed Clinical Social Worker

## 2020-10-15 DIAGNOSIS — R809 Proteinuria, unspecified: Secondary | ICD-10-CM

## 2020-10-15 DIAGNOSIS — Z719 Counseling, unspecified: Secondary | ICD-10-CM

## 2020-10-15 NOTE — Chronic Care Management (AMB) (Signed)
    Clinical Social Work  Care Management   Phone Outreach    10/15/2020 Name: KAAMIL MOREFIELD MRN: [id number] DOB: November 18, 1945  [name] is a 75 y.o. year old male who is a primary care patient of Danna Hefty, DO .   CCM LCSW reached out to patient today by phone to introduce self, assess needs due to to discontinuing services with [facility].    Telephone outreach was unsuccessful A HIPPA compliant phone message was left for the patient providing contact information and requesting a return call.   Plan:CCM LCSW will wait for return call. If no return call is received, Will reach out to patient again in the next 7 to 14 days .   Review of patient status, including review of consultants reports, relevant laboratory and other test results, and collaboration with appropriate care team members and the patient's provider was performed as part of comprehensive patient evaluation and provision of care management services.    Casimer Lanius, [place] / [place]   [phone] 3:03 PM

## 2020-10-15 NOTE — Telephone Encounter (Signed)
Called patient about results from recent visit Urine protein:creat ratio elevated, appears to have had proteinuria for quite some time Will refer to nephrology, he is agreeable. Referral placed  Leeanne Rio, MD

## 2020-10-15 NOTE — Chronic Care Management (AMB) (Signed)
  Care Management   Clinical Social Work Phone Note  10/15/2020 Name: Jeffrey [name] MRN: [id number] DOB: Aug 31, 1945 Jeffrey [name] is a 75 y.o. year old male who sees Danna Hefty, DO for primary care.    The Care Management team was consulted to assess patient's needs after disconnection with services provided by [facility].  Assessment: Patient is engaged in conversation, continues to maintain positive progress with care. Review and discussed services and support options with patient.  Patient reports he is doing well, able to meet ADL's , cooks and cleans for himself, cuts his own lawn with riding mower and drives self to all appointments. Children live out of town but has greats support with [place].  Recommendation: None.  Patient declines services at this time.   Follow up Plan: Patient does not require or desire continued follow-up. Will contact the office if needed   Review of patient status, including review of consultants reports, relevant laboratory and other test results, and collaboration with appropriate care team members and the patient's provider was performed as part of comprehensive patient evaluation and provision of chronic care management services.       Information about Care Management services was shared with Jeffrey [name] today including:  Care Management services include personalized support from designated clinical staff supervised by his physician, including individualized plan of care and coordination with other care providers Remind patient of 24/7 contact phone numbers to provider's office for assistance with urgent and routine care needs. Care Management services are voluntary and patient may stop at any time .   Patient agreed to services provided today and verbal consent obtained.       Casimer Lanius, [place] / [place]   [phone] 3:22 PM

## 2020-11-12 DIAGNOSIS — R351 Nocturia: Secondary | ICD-10-CM | POA: Diagnosis not present

## 2020-11-12 DIAGNOSIS — N401 Enlarged prostate with lower urinary tract symptoms: Secondary | ICD-10-CM | POA: Diagnosis not present

## 2020-11-12 DIAGNOSIS — Z125 Encounter for screening for malignant neoplasm of prostate: Secondary | ICD-10-CM | POA: Diagnosis not present

## 2020-11-12 DIAGNOSIS — R3915 Urgency of urination: Secondary | ICD-10-CM | POA: Diagnosis not present

## 2020-11-22 ENCOUNTER — Ambulatory Visit (INDEPENDENT_AMBULATORY_CARE_PROVIDER_SITE_OTHER): Payer: Medicare HMO | Admitting: Family Medicine

## 2020-11-22 ENCOUNTER — Other Ambulatory Visit: Payer: Self-pay

## 2020-11-22 DIAGNOSIS — I10 Essential (primary) hypertension: Secondary | ICD-10-CM | POA: Diagnosis not present

## 2020-11-22 DIAGNOSIS — N1831 Chronic kidney disease, stage 3a: Secondary | ICD-10-CM | POA: Diagnosis not present

## 2020-11-22 NOTE — Patient Instructions (Addendum)
It was great to see you today.  Great job on staying on top of your medications and health overall.  I will reach out to out referral coordinator to make sure that you are still on [place].  Please reach out anytime you need anything.  Thanks, Dr Manus Rudd

## 2020-11-23 MED ORDER — PANTOPRAZOLE SODIUM 20 MG PO TBEC: 40.0000 mg | DELAYED_RELEASE_TABLET | Freq: Every day | ORAL | 5 refills | Status: DC

## 2020-11-23 NOTE — Assessment & Plan Note (Addendum)
Well controlled today at 124/70.  Last LDL  On 10/21 was 53. - Continue Amlodipine and Rosuvastatin - Follow-up in 1 year

## 2020-11-23 NOTE — Progress Notes (Signed)
    SUBJECTIVE:   CHIEF COMPLAINT / HPI:   Patient indicates feeling well at this time.  Had referral placed to [facility] but hasn't heard back.  Given previous testing appears to have CKD Stage 3a.  Is being seen by Urology and recently started on Myrbetric 50 mg to help with urge incontinence at night.  Also seen by ID for HIV and liver treatment.  Patient spoke to [facility] was and who told him was not due for colonoscopy until 2023.   OBJECTIVE:   BP 124/70   Pulse 85   Ht 5' 10"  (1.778 m)   Wt 165 lb 6 oz (75 kg)   SpO2 97%   BMI 23.73 kg/m    Physical Exam HENT:     Head: Normocephalic and atraumatic.     Mouth/Throat:     Mouth: Mucous membranes are moist.  Cardiovascular:     Rate and Rhythm: Normal rate and regular rhythm.     Pulses: Normal pulses.  Pulmonary:     Effort: Pulmonary effort is normal.     Breath sounds: Normal breath sounds.  Musculoskeletal:     Right lower leg: No edema.     Left lower leg: No edema.  Neurological:     Mental Status: He is alert.     ASSESSMENT/PLAN:   Essential hypertension Well controlled today at 124/70.  Last LDL  On 10/21 was 53. - Continue Amlodipine and Rosuvastatin - Follow-up in 1 year  Chronic kidney disease Appears is Stage 3a based on previous study.  Referral placed to [facility].  Likely taking time to get back to given less severe progression of renal disease. - Avoid nephrotoxic medications like NSAIDs  - Establish [place] Maintenance Went through patient's current medications list and updated.  Patient has good knowledge of health problems and current medications.   - Colonoscopy with [facility] in 2023  Delora Fuel, MD [place]

## 2020-11-23 NOTE — Assessment & Plan Note (Signed)
Appears is Stage 3a based on previous study.  Referral placed to [facility].  Likely taking time to get back to given less severe progression of renal disease. - Avoid nephrotoxic medications like NSAIDs  - Establish [facility]

## 2020-11-24 ENCOUNTER — Other Ambulatory Visit (HOSPITAL_COMMUNITY): Payer: Self-pay | Admitting: Interventional Radiology

## 2020-11-24 ENCOUNTER — Telehealth (HOSPITAL_COMMUNITY): Payer: Self-pay

## 2020-11-24 DIAGNOSIS — I639 Cerebral infarction, unspecified: Secondary | ICD-10-CM

## 2020-11-24 NOTE — Telephone Encounter (Signed)
Called to schedule mra, no answer, left vm. AW

## 2020-11-29 ENCOUNTER — Other Ambulatory Visit: Payer: Self-pay | Admitting: Cardiovascular Disease

## 2020-11-29 DIAGNOSIS — E785 Hyperlipidemia, unspecified: Secondary | ICD-10-CM

## 2020-12-03 ENCOUNTER — Other Ambulatory Visit: Payer: Self-pay

## 2020-12-03 ENCOUNTER — Ambulatory Visit (HOSPITAL_COMMUNITY)
Admission: RE | Admit: 2020-12-03 | Discharge: 2020-12-03 | Disposition: A | Discharge status: Home / Self Care | Payer: Medicare HMO | Source: Ambulatory Visit | Attending: Interventional Radiology | Admitting: Interventional Radiology

## 2020-12-03 DIAGNOSIS — Z8673 Personal history of transient ischemic attack (TIA), and cerebral infarction without residual deficits: Secondary | ICD-10-CM | POA: Diagnosis not present

## 2020-12-03 DIAGNOSIS — I6623 Occlusion and stenosis of bilateral posterior cerebral arteries: Secondary | ICD-10-CM | POA: Diagnosis not present

## 2020-12-03 DIAGNOSIS — Q283 Other malformations of cerebral vessels: Secondary | ICD-10-CM | POA: Diagnosis not present

## 2020-12-03 DIAGNOSIS — I6521 Occlusion and stenosis of right carotid artery: Secondary | ICD-10-CM | POA: Diagnosis not present

## 2020-12-03 DIAGNOSIS — I639 Cerebral infarction, unspecified: Secondary | ICD-10-CM | POA: Insufficient documentation

## 2020-12-08 ENCOUNTER — Telehealth (HOSPITAL_COMMUNITY): Payer: Self-pay

## 2020-12-08 NOTE — Telephone Encounter (Signed)
Pt agreed to f/u in 6 months with mra. AW

## 2020-12-16 ENCOUNTER — Telehealth: Payer: Self-pay

## 2020-12-16 NOTE — Telephone Encounter (Signed)
LVM to have pt call back to schedule AWV.   RE: confirm insurance and schedule AWV on my schedule if times are convenient for patient or other AWV schedule as template permits.   

## 2020-12-17 ENCOUNTER — Ambulatory Visit (INDEPENDENT_AMBULATORY_CARE_PROVIDER_SITE_OTHER): Payer: Medicare HMO

## 2020-12-17 DIAGNOSIS — Z0000 Encounter for general adult medical examination without abnormal findings: Secondary | ICD-10-CM

## 2020-12-17 DIAGNOSIS — Z1211 Encounter for screening for malignant neoplasm of colon: Secondary | ICD-10-CM

## 2020-12-17 NOTE — Progress Notes (Signed)
Subjective:   Jeffrey [name] is a 75 y.o. male who presents for Medicare Annual/Subsequent preventive examination.  I connected with  Jeffrey [name] on 12/18/20 by audio enabled telemedicine application and verified that I am speaking with the correct person using two identifiers.   I discussed the limitations of evaluation and management by telemedicine. The patient expressed understanding and agreed to proceed.   Review of Systems    Defer to PCP   Cardiac Risk Factors include: family history of premature cardiovascular disease;advanced age (>62mn, >>65women);hypertension;male gender     Objective:    Today's Vitals   12/17/20 1716  PainSc: 5    There is no height or weight on file to calculate BMI.  Advanced Directives 12/17/2020 11/22/2020 09/21/2020 09/06/2020 09/06/2020 09/06/2020 05/11/2020  Does Patient Have a Medical Advance Directive? Yes No No - No No Yes  Type of Advance Directive [place] Does patient want to make changes to medical advance directive? Yes (MAU/Ambulatory/Procedural Areas - Information given) - - - - - No - Patient declined  Copy of [place] Chart? No - copy requested - - - - - -  Would patient like information on creating a medical advance directive? No - Patient declined No - Patient declined No - Patient declined No - Patient declined - - -    Current Medications (verified) Outpatient Encounter Medications as of 12/17/2020  Medication Sig   abacavir-dolutegravir-lamiVUDine (TRIUMEQ) 600-50-300 MG tablet Take 1 tablet by mouth daily.   amLODipine (NORVASC) 10 MG tablet TAKE 1 TABLET BY MOUTH ONCE DAILY([facility])   Ascorbic Acid (VITAMIN C) 1000 MG tablet Take 1,000 mg by mouth daily at 6 PM.   aspirin EC 81 MG tablet Take 1 tablet (81 mg total) by mouth daily.   Cholecalciferol (VITAMIN D3) 125 MCG (5000 UT) TABS Take 5,000 Units by mouth daily at 6  PM.   clopidogrel (PLAVIX) 75 MG tablet Take 1 tablet by mouth once daily   finasteride (PROSCAR) 5 MG tablet Take 5 mg by mouth daily.   MYRBETRIQ 50 MG TB24 tablet    pantoprazole (PROTONIX) 20 MG tablet Take 2 tablets (40 mg total) by mouth daily.   rosuvastatin (CRESTOR) 20 MG tablet Take 1 tablet by mouth once daily   vitamin B-12 (CYANOCOBALAMIN) 500 MCG tablet Take 500 mcg by mouth daily.   Zinc 50 MG TABS Take 50 mg by mouth daily at 6 PM.   No facility-administered encounter medications on file as of 12/17/2020.    Allergies (verified) Baclofen, Efavirenz, and Nevirapine   History: Past Medical History:  Diagnosis Date   Chronic kidney disease    Colon polyps    COVID-19    05/2019   GERD (gastroesophageal reflux disease)    Heart murmur    History of kidney stones    HIV [place] immunodeficiency virus infection[place]   Hypertension    Pneumonia    Stroke ([place]    right side weakness   TIA (transient ischemic attack)    Past Surgical History:  Procedure Laterality Date   CHOLECYSTECTOMY  1982   COLONOSCOPY     ESOPHAGEAL MANOMETRY N/A 09/03/2017   Procedure: ESOPHAGEAL MANOMETRY (EM);  Surgeon: NMauri Pole MD;  Location: [facility];  Service: Endoscopy;  Laterality: N/A;   IR ANGIO INTRA EXTRACRAN SEL COM CAROTID INNOMINATE BILAT  MOD SED  04/21/2019   IR ANGIO INTRA EXTRACRAN SEL COM CAROTID INNOMINATE BILAT MOD SED  05/11/2020   IR ANGIO VERTEBRAL SEL VERTEBRAL BILAT MOD SED  04/21/2019   IR ANGIO VERTEBRAL SEL VERTEBRAL BILAT MOD SED  05/11/2020   IR PTA INTRACRANIAL  05/14/2019   IR US GUIDE VASC ACCESS RIGHT  05/11/2020   KNEE SURGERY     LEFT KNEE   RADIOLOGY WITH ANESTHESIA N/A 05/14/2019   Procedure: IR WITH ANESTHESIA STENTING;  Surgeon: Luanne Bras, MD;  Location: [facility];  Service: Radiology;  Laterality: N/A;   TONSILLECTOMY     Family History  Problem Relation Age of Onset   Hypertension Mother    Heart attack Mother 32   Alzheimer's  disease Sister    Heart disease Sister    Alzheimer's disease Sister    Drug abuse Sister    COPD Brother    Kidney cancer Brother    Colon cancer Neg Hx    Esophageal cancer Neg Hx    Pancreatic cancer Neg Hx    Stomach cancer Neg Hx    Liver disease Neg Hx    Social History   Socioeconomic History   Marital status: Divorced    Spouse name: Not on file   Number of children: 2   Years of education: 14   Highest education level: Some college, no degree  Occupational History   Occupation: retired    Fish farm manager: [employer]  Tobacco Use   Smoking status: Never   Smokeless tobacco: Former    Types: Chew    Quit date: 2019  Vaping Use   Vaping Use: Never used  Substance and Sexual Activity   Alcohol use: Yes    Comment: rare - few per year   Drug use: No   Sexual activity: Not Currently    Partners: Female    Comment: declined condoms  Other Topics Concern   Not on file  Social History Narrative   Right handed    Caffeine use: tea, mainly water      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, environmental   2 children - 1 boy and 1 girl   Social Determinants of Radio broadcast assistant Strain: Low Risk    Difficulty of Paying Living Expenses: Not hard at all  Food Insecurity: No Food Insecurity   Worried About Charity fundraiser in the Last Year: Never true   Arboriculturist in the Last Year: Never true  Transportation Needs: No Transportation Needs   Lack of Transportation (Medical): No   Lack of Transportation (Non-Medical): No  Physical Activity: Sufficiently Active   Days of Exercise per Week: 5 days   Minutes of Exercise per Session: 30 min  Stress: No Stress Concern Present   Feeling of Stress : Only a little  Social Connections: Moderately Isolated   Frequency of Communication with Friends and Family: Twice a week   Frequency of Social Gatherings with Friends and Family: Once a week   Attends Religious Services: Never   Marine scientist or Organizations: Yes    Attends Archivist Meetings: Never   Marital Status: Divorced    Tobacco Counseling Counseling given: Yes   Clinical Intake:  Pre-visit preparation completed: Yes  Pain : 0-10 Pain Score: 5  Pain Type: Acute pain Pain Location: Neck Pain Orientation: Lateral, Right, Left Pain Radiating Towards: Pain can radiate down back and left arm at times. Pain Descriptors /  Indicators: Sharp Pain Onset: More than a month ago Pain Frequency: Occasional Pain Relieving Factors: Ibuprofen does help with the pain Effect of Pain on Daily Activities: No effect on ADL's  Pain Relieving Factors: Ibuprofen does help with the pain  Nutritional Risks: None Diabetes: No  How often do you need to have someone help you when you read instructions, pamphlets, or other written materials from your doctor or pharmacy?: 1 - Never What is the last grade level you completed in school?: Some College  Diabetic? NO  Interpreter Needed?: No  Information entered by :: [employer], [place]   Activities of Daily Living In your present state of health, do you have any difficulty performing the following activities: 12/17/2020 09/06/2020  Hearing? N Y  Vision? N Y  Difficulty concentrating or making decisions? N N  Walking or climbing stairs? N N  Dressing or bathing? N N  Doing errands, shopping? N N  Preparing Food and eating ? N -  Using the Toilet? N -  In the past six months, have you accidently leaked urine? N -  Do you have problems with loss of bowel control? N -  Managing your Medications? N -  Housekeeping or managing your Housekeeping? N -  Some recent data might be hidden    Patient Care Team: Delora Fuel, MD as PCP - General (Family Medicine) [name], MD as PCP - Infectious Diseases (Infectious Diseases) Lazaro Arms, RN as [place] any recent Medical Services you may have received from other than [facility] providers in  the past year (date may be approximate).     Assessment:   This is a routine wellness examination for [place].  Hearing/Vision screen No results found.  Dietary issues and exercise activities discussed: Current Exercise Habits: Home exercise routine, Type of exercise: strength training/weights;walking, Time (Minutes): 40, Frequency (Times/Week): 7, Weekly Exercise (Minutes/Week): 280, Intensity: Mild, Exercise limited by: respiratory conditions(s)   Goals Addressed               This Visit's Progress     Healthy Nutrition Achieved (pt-stated)        Notes: Goals for the next year:   Patient wants to stay as healthy as he is or even get better. Patient states that his breathing has been different since having COVID. Not able to walk without dyspnea. It has changed his ability to complete tasks and hobbies like yard work. Patient is still motivated to do as much as he can to maintain his current health status.       Depression Screen PHQ 2/9 Scores 12/17/2020 11/22/2020 09/21/2020 08/10/2020 07/13/2020 06/21/2020 06/08/2020  PHQ - 2 Score 0 0 0 0 0 0 0  PHQ- 9 Score 0 2 1 1 1  0 6  Exception Documentation - - - - - - -    Fall Risk Fall Risk  12/17/2020 11/22/2020 09/21/2020 09/15/2020 06/21/2020  Falls in the past year? 0 0 0 0 0  Number falls in past yr: 0 0 0 - 0  Injury with Fall? 0 0 0 - -  Risk for fall due to : No Fall Risks - - - -  Follow up Falls evaluation completed - - - Falls evaluation completed    FALL RISK PREVENTION PERTAINING TO THE HOME:  Any stairs in or around the home? Yes  If so, are there any without handrails? No  Home free of loose throw rugs in walkways, pet beds, electrical  cords, etc? Yes  Adequate lighting in your home to reduce risk of falls? Yes   ASSISTIVE DEVICES UTILIZED TO PREVENT FALLS:  Life alert? No  Use of a cane, walker or w/c? No  Grab bars in the bathroom? No  Shower chair or bench in shower? No  Elevated toilet seat or a handicapped  toilet? No   TIMED UP AND GO:  Was the test performed?  N/A .  Length of time to ambulate 10 feet: N/A sec.   Per patient statement: Gait steady and fast without use of assistive device  Cognitive Function:     6CIT Screen 12/17/2020  What Year? 0 points  What month? 0 points  What time? 0 points  Count back from 20 0 points  Months in reverse 0 points  Repeat phrase 0 points  Total Score 0    Immunizations Immunization History  Administered Date(s) Administered   H1N1 09/22/2008   Hepatitis B 09/24/2001, 11/07/2001, 05/26/2002   Influenza Whole 02/20/2006, 03/21/2007, 03/31/2008, 03/08/2009, 01/26/2010, 12/28/2010   Influenza,inj,Quad PF,6+ Mos 04/10/2013, 01/27/2014, 01/21/2015, 01/20/2016, 01/23/2017, 02/07/2018, 01/21/2019   Moderna Sars-Covid-2 Vaccination 09/17/2019, 10/15/2019   Pneumococcal Conjugate-13 01/06/2015   Pneumococcal Polysaccharide-23 09/22/2008, 08/07/2013   Tdap 01/06/2015    TDAP status: Up to date  Flu Vaccine status: Due, Education has been provided regarding the importance of this vaccine. Advised may receive this vaccine at local pharmacy or Health Dept. Aware to provide a copy of the vaccination record if obtained from local pharmacy or Health Dept. Verbalized acceptance and understanding.  Patient agrees to any recommended vaccines.   Pneumococcal vaccine status: Up to date  Covid-19 vaccine status: Information provided on how to obtain vaccines.   Qualifies for Shingles Vaccine? Yes   Zostavax completed No   Shingrix Completed?: No.    Education has been provided regarding the importance of this vaccine. Patient has been advised to call insurance company to determine out of pocket expense if they have not yet received this vaccine. Advised may also receive vaccine at local pharmacy or Health Dept. Verbalized acceptance and understanding.  Screening Tests Health Maintenance  Topic Date Due   Zoster Vaccines- Shingrix (1 of 2) Never done    COVID-19 Vaccine (3 - Moderna risk series) 11/12/2019   COLONOSCOPY (Pts 45-50yr Insurance coverage will need to be confirmed)  08/03/2020   INFLUENZA VACCINE  08/05/2021 (Originally 12/06/2020)   TETANUS/TDAP  01/05/2025   Hepatitis C Screening  Completed   PNA vac Low Risk Adult  Completed   HPV VACCINES  Aged Out    Health Maintenance  Health Maintenance Due  Topic Date Due   Zoster Vaccines- Shingrix (1 of 2) Never done   COVID-19 Vaccine (3 - Moderna risk series) 11/12/2019   COLONOSCOPY (Pts 45-483yrInsurance coverage will need to be confirmed)  08/03/2020    Colorectal cancer screening: Referral to GI placed to [facility]. Pt aware the office will call re: appt.  Lung Cancer Screening: (Low Dose CT Chest recommended if Age 73-80ears, 30 pack-year currently smoking OR have quit w/in 15years.) does not qualify.   Lung Cancer Screening Referral: n/a  Additional Screening:  Hepatitis C Screening: does qualify; Completed 07/02/2006  Vision Screening: Recommended annual ophthalmology exams for early detection of glaucoma and other disorders of the eye. Is the patient up to date with their annual eye exam?  Yes  Who is the provider or what is the name of the office in which the patient attends annual  eye exams?  If pt is not established with a provider, would they like to be referred to a provider to establish care?  N/A .   Dental Screening: Recommended annual dental exams for proper oral hygiene  Community Resource Referral / Chronic Care Management: CRR required this visit?  No   CCM required this visit?  Yes      Plan:     I have personally reviewed and noted the following in the patient's chart:   Medical and social history Use of alcohol, tobacco or illicit drugs  Current medications and supplements including opioid prescriptions. Patient is not currently taking opioid prescriptions. Functional ability and status Nutritional status Physical  activity Advanced directives List of other physicians Hospitalizations, surgeries, and ER visits in previous 12 months Vitals Screenings to include cognitive, depression, and falls Referrals and appointments  In addition, I have reviewed and discussed with patient certain preventive protocols, quality metrics, and best practice recommendations. A written personalized care plan for preventive services as well as general preventive health recommendations were provided to patient.     Tawni Pummel, [place]   12/18/2020   Nurse Notes:    Mr. Pallone , Thank you for taking time to come for your Medicare Wellness Visit. I appreciate your ongoing commitment to your health goals. Please review the following plan we discussed and let me know if I can assist you in the future.   These are the goals we discussed:  Goals       Healthy Nutrition Achieved (pt-stated)      Notes: Goals for the next year:   Patient wants to stay as healthy as he is or even get better. Patient states that his breathing has been different since having COVID. Not able to walk without dyspnea. It has changed his ability to complete tasks and hobbies like yard work. Patient is still motivated to do as much as he can to maintain his current health status.         This is a list of the screening recommended for you and due dates:  Health Maintenance  Topic Date Due   Zoster (Shingles) Vaccine (1 of 2) Never done   COVID-19 Vaccine (3 - Moderna risk series) 11/12/2019   Colon Cancer Screening  08/03/2020   Flu Shot  08/05/2021*   Tetanus Vaccine  01/05/2025   Hepatitis C Screening: USPSTF Recommendation to screen - Ages 18-79 yo.  Completed   Pneumonia vaccines  Completed   HPV Vaccine  Aged Out  *Topic was postponed. The date shown is not the original due date.

## 2020-12-18 NOTE — Patient Instructions (Signed)
  Mr. Gallaga , Thank you for taking time to come for your Medicare Wellness Visit. I appreciate your ongoing commitment to your health goals. Please review the following plan we discussed and let me know if I can assist you in the future.   These are the goals we discussed:  Goals       Healthy Nutrition Achieved (pt-stated)      Notes: Goals for the next year:   Patient wants to stay as healthy as he is or even get better. Patient states that his breathing has been different since having COVID. Not able to walk without dyspnea. It has changed his ability to complete tasks and hobbies like yard work. Patient is still motivated to do as much as he can to maintain his current health status.         This is a list of the screening recommended for you and due dates:  Health Maintenance  Topic Date Due   Zoster (Shingles) Vaccine (1 of 2) Never done   COVID-19 Vaccine (3 - Moderna risk series) 11/12/2019   Colon Cancer Screening  08/03/2020   Flu Shot  08/05/2021*   Tetanus Vaccine  01/05/2025   Hepatitis C Screening: USPSTF Recommendation to screen - Ages 18-79 yo.  Completed   Pneumonia vaccines  Completed   HPV Vaccine  Aged Out  *Topic was postponed. The date shown is not the original due date.

## 2020-12-18 NOTE — Telephone Encounter (Signed)
Pt states that his children has HCPOA but may need to update the form. I  was not able to find it in the chart. Patient can bring in a copy if needed.   Pt has stated interest in completing a ACP and other forms as recommended by PCP.  Pt has also expressed concern and interested regarding financial needs surrounding housing.

## 2020-12-28 NOTE — Progress Notes (Signed)
I discussed the limitations, risks, security and privacy concerns of performing an evaluation and management service by telephone and the availability of in person appointments. I also discussed with the patient that there may be a patient responsible charge related to this service. The patient expressed understanding and verbally consented to this telephonic visit.  Location of Patient: Home Location of Provider: Office  List any persons and their role that are participating in the visit with the patient: None

## 2021-01-31 DIAGNOSIS — I129 Hypertensive chronic kidney disease with stage 1 through stage 4 chronic kidney disease, or unspecified chronic kidney disease: Secondary | ICD-10-CM | POA: Diagnosis not present

## 2021-01-31 DIAGNOSIS — N189 Chronic kidney disease, unspecified: Secondary | ICD-10-CM | POA: Diagnosis not present

## 2021-01-31 DIAGNOSIS — D631 Anemia in chronic kidney disease: Secondary | ICD-10-CM | POA: Diagnosis not present

## 2021-01-31 DIAGNOSIS — N2581 Secondary hyperparathyroidism of renal origin: Secondary | ICD-10-CM | POA: Diagnosis not present

## 2021-01-31 DIAGNOSIS — R809 Proteinuria, unspecified: Secondary | ICD-10-CM | POA: Diagnosis not present

## 2021-01-31 DIAGNOSIS — N1831 Chronic kidney disease, stage 3a: Secondary | ICD-10-CM | POA: Diagnosis not present

## 2021-02-02 ENCOUNTER — Other Ambulatory Visit: Payer: Self-pay | Admitting: Nephrology

## 2021-02-02 DIAGNOSIS — N1831 Chronic kidney disease, stage 3a: Secondary | ICD-10-CM

## 2021-02-14 ENCOUNTER — Ambulatory Visit
Admission: RE | Admit: 2021-02-14 | Discharge: 2021-02-14 | Disposition: A | Discharge status: Home / Self Care | Payer: Medicare HMO | Source: Ambulatory Visit | Attending: Nephrology | Admitting: Nephrology

## 2021-02-14 DIAGNOSIS — N1831 Chronic kidney disease, stage 3a: Secondary | ICD-10-CM

## 2021-02-14 DIAGNOSIS — N189 Chronic kidney disease, unspecified: Secondary | ICD-10-CM | POA: Diagnosis not present

## 2021-02-15 DIAGNOSIS — I129 Hypertensive chronic kidney disease with stage 1 through stage 4 chronic kidney disease, or unspecified chronic kidney disease: Secondary | ICD-10-CM | POA: Diagnosis not present

## 2021-02-17 ENCOUNTER — Other Ambulatory Visit: Payer: Self-pay

## 2021-02-17 ENCOUNTER — Other Ambulatory Visit: Payer: Medicare HMO

## 2021-02-17 DIAGNOSIS — B20 Human immunodeficiency virus [HIV] disease: Secondary | ICD-10-CM

## 2021-02-17 DIAGNOSIS — Z79899 Other long term (current) drug therapy: Secondary | ICD-10-CM | POA: Diagnosis not present

## 2021-02-17 DIAGNOSIS — Z113 Encounter for screening for infections with a predominantly sexual mode of transmission: Secondary | ICD-10-CM | POA: Diagnosis not present

## 2021-02-18 LAB — T-HELPER CELL (CD4) - (RCID CLINIC ONLY)
CD4 % Helper T Cell: 20 % — ABNORMAL LOW (ref 33–65)
CD4 T Cell Abs: 396 /uL — ABNORMAL LOW (ref 400–1790)

## 2021-02-19 LAB — COMPREHENSIVE METABOLIC PANEL
AG Ratio: 1.6 (calc) (ref 1.0–2.5)
ALT: 15 U/L (ref 9–46)
AST: 13 U/L (ref 10–35)
Albumin: 4.4 g/dL (ref 3.6–5.1)
Alkaline phosphatase (APISO): 82 U/L (ref 35–144)
BUN/Creatinine Ratio: 19 (calc) (ref 6–22)
BUN: 30 mg/dL — ABNORMAL HIGH (ref 7–25)
CO2: 27 mmol/L (ref 20–32)
Calcium: 9.7 mg/dL (ref 8.6–10.3)
Chloride: 105 mmol/L (ref 98–110)
Creat: 1.54 mg/dL — ABNORMAL HIGH (ref 0.70–1.28)
Globulin: 2.7 g/dL (calc) (ref 1.9–3.7)
Glucose, Bld: 100 mg/dL — ABNORMAL HIGH (ref 65–99)
Potassium: 4.1 mmol/L (ref 3.5–5.3)
Sodium: 141 mmol/L (ref 135–146)
Total Bilirubin: 0.3 mg/dL (ref 0.2–1.2)
Total Protein: 7.1 g/dL (ref 6.1–8.1)

## 2021-02-19 LAB — LIPID PANEL
Cholesterol: 131 mg/dL (ref <200)
HDL: 43 mg/dL (ref >=40)
LDL Cholesterol (Calc): 63 mg/dL (calc)
Non-HDL Cholesterol (Calc): 88 mg/dL (calc) (ref <130)
Total CHOL/HDL Ratio: 3 (calc) (ref <5.0)
Triglycerides: 186 mg/dL — ABNORMAL HIGH (ref <150)

## 2021-02-19 LAB — HIV-1 RNA QUANT-NO REFLEX-BLD
HIV 1 RNA Quant: 35 Copies/mL — ABNORMAL HIGH
HIV-1 RNA Quant, Log: 1.54 Log cps/mL — ABNORMAL HIGH

## 2021-02-19 LAB — CBC
HCT: 43.1 % (ref 38.5–50.0)
Hemoglobin: 14.8 g/dL (ref 13.2–17.1)
MCH: 33.6 pg — ABNORMAL HIGH (ref 27.0–33.0)
MCHC: 34.3 g/dL (ref 32.0–36.0)
MCV: 98 fL (ref 80.0–100.0)
MPV: 8.8 fL (ref 7.5–12.5)
Platelets: 189 10*3/uL (ref 140–400)
RBC: 4.4 10*6/uL (ref 4.20–5.80)
RDW: 13 % (ref 11.0–15.0)
WBC: 10.7 10*3/uL (ref 3.8–10.8)

## 2021-02-19 LAB — RPR: RPR Ser Ql: NONREACTIVE

## 2021-02-28 ENCOUNTER — Other Ambulatory Visit: Payer: Self-pay | Admitting: Family Medicine

## 2021-02-28 ENCOUNTER — Ambulatory Visit (INDEPENDENT_AMBULATORY_CARE_PROVIDER_SITE_OTHER): Payer: Medicare HMO | Admitting: Family Medicine

## 2021-02-28 ENCOUNTER — Encounter: Payer: Self-pay | Admitting: Family Medicine

## 2021-02-28 ENCOUNTER — Other Ambulatory Visit: Payer: Self-pay

## 2021-02-28 ENCOUNTER — Other Ambulatory Visit: Payer: Self-pay | Admitting: Cardiovascular Disease

## 2021-02-28 VITALS — BP 137/85 | HR 72 | Ht 70.0 in | Wt 172.6 lb

## 2021-02-28 DIAGNOSIS — N644 Mastodynia: Secondary | ICD-10-CM

## 2021-02-28 DIAGNOSIS — E785 Hyperlipidemia, unspecified: Secondary | ICD-10-CM

## 2021-02-28 DIAGNOSIS — N62 Hypertrophy of breast: Secondary | ICD-10-CM | POA: Diagnosis not present

## 2021-02-28 NOTE — Patient Instructions (Signed)
It was nice seeing you today. Given that you are on Proscar, this could be benign gynecomastia. However, I will like to rule out breast cancer. I have placed referral for breast ultrasound. Please call [facility] to schedule your appointment at [phone].

## 2021-02-28 NOTE — Assessment & Plan Note (Signed)
Left breast. Likely related to his chronic Proscar use for BPH. Counseling provided regarding risk for Gynecomastia and breast cancer from this medication. Breast US discussed and was ordered. Instruction provided to contact GI breast center to schedule his appointment. He agreed with the plan.

## 2021-02-28 NOTE — Progress Notes (Signed)
    SUBJECTIVE:   CHIEF COMPLAINT / HPI:   HPI Breast pain: The patient c/o left breast pain x 1 yr. Sometimes it swells up, and occasionally painful. He is uncertain if there is a knot or not. He experiences intermittent sharp pain with or without touch. He feels his symptoms are getting worse. No nipple discharge. His older brother has kidney cancer.   PERTINENT  PMH / PSH: PMHx reviewed  OBJECTIVE:   BP 137/85   Pulse 72   Ht 5' 10"  (1.778 m)   Wt 172 lb 9.6 oz (78.3 kg)   SpO2 100%   BMI 24.77 kg/m   Physical Exam Vitals and nursing note reviewed. Exam conducted with a chaperone present Lavell Anchors).  Cardiovascular:     Rate and Rhythm: Normal rate and regular rhythm.     Heart sounds: Normal heart sounds. No murmur heard. Pulmonary:     Effort: Pulmonary effort is normal. No respiratory distress.     Breath sounds: Normal breath sounds. No wheezing.  Chest:     Comments: Left breast slightly larger than the right. No palpable nodules in both breast with no nipple discharge. +tenderness of his left breast. Abdominal:     General: Bowel sounds are normal.     Palpations: Abdomen is soft. There is no mass.     Tenderness: There is no abdominal tenderness.     ASSESSMENT/PLAN:   Gynecomastia Left breast. Likely related to his chronic Proscar use for BPH. Counseling provided regarding risk for Gynecomastia and breast cancer from this medication. Breast US discussed and was ordered. Instruction provided to contact GI breast center to schedule his appointment. He agreed with the plan.    Andrena Mews, MD [place]

## 2021-03-03 ENCOUNTER — Ambulatory Visit (INDEPENDENT_AMBULATORY_CARE_PROVIDER_SITE_OTHER): Payer: Medicare HMO | Admitting: Internal Medicine

## 2021-03-03 ENCOUNTER — Ambulatory Visit
Admission: RE | Admit: 2021-03-03 | Discharge: 2021-03-03 | Disposition: A | Discharge status: Home / Self Care | Payer: Medicare HMO | Source: Ambulatory Visit | Attending: Family Medicine | Admitting: Family Medicine

## 2021-03-03 ENCOUNTER — Ambulatory Visit: Payer: Medicare HMO

## 2021-03-03 ENCOUNTER — Encounter: Payer: Self-pay | Admitting: Internal Medicine

## 2021-03-03 ENCOUNTER — Other Ambulatory Visit: Payer: Self-pay

## 2021-03-03 DIAGNOSIS — B20 Human immunodeficiency virus [HIV] disease: Secondary | ICD-10-CM

## 2021-03-03 DIAGNOSIS — N62 Hypertrophy of breast: Secondary | ICD-10-CM | POA: Diagnosis not present

## 2021-03-03 DIAGNOSIS — N644 Mastodynia: Secondary | ICD-10-CM

## 2021-03-03 DIAGNOSIS — R922 Inconclusive mammogram: Secondary | ICD-10-CM | POA: Diagnosis not present

## 2021-03-03 DIAGNOSIS — A539 Syphilis, unspecified: Secondary | ICD-10-CM

## 2021-03-03 MED ORDER — TRIUMEQ 600-50-300 MG PO TABS: 1.0000 | ORAL_TABLET | Freq: Every day | ORAL | 11 refills | Status: DC

## 2021-03-03 NOTE — Assessment & Plan Note (Signed)
His early syphilis was treated appropriately and cured.

## 2021-03-03 NOTE — Assessment & Plan Note (Signed)
Gynecomastia can be seen occasionally in well-controlled HIV infected patients.  I will await the results of his mammogram.

## 2021-03-03 NOTE — Progress Notes (Signed)
Patient Active Problem List   Diagnosis Date Noted   Human immunodeficiency virus (HIV) disease ([place]) 02/27/2006    Priority: 1.   Hyperlipidemia 07/22/2012    Priority: 2.   Erectile dysfunction 07/22/2012    Priority: 2.   GERD 05/17/2010    Priority: 2.   Dysphagia 03/08/2009    Priority: 2.   Essential hypertension 02/27/2006    Priority: 2.   Gynecomastia 02/28/2021   Proteinuria 09/23/2020   Nocturnal polyuria 09/21/2020   Meralgia paresthetica, left lower limb 09/21/2020   Dehydration    Infectious colitis    Rib pain 07/13/2020   Syphilis (acquired) 06/22/2020   Rectal abnormality 05/27/2020   Neck pain 12/23/2019   Other headache syndrome 12/23/2019   Occipital neuralgia of right side 12/23/2019   CAD (coronary artery disease) 10/27/2019   Internal carotid artery stenosis, right 05/14/2019   Peripheral vertigo 11/29/2018   Chronic kidney disease 50/93/2671   Diastolic CHF with preserved left ventricular function, NYHA class 2 ([place]) 01/06/2015   Exertional dyspnea 09/30/2014   Insomnia 07/22/2014   BPH with obstruction/lower urinary tract symptoms 06/17/2014   CERVICAL LYMPHADENOPATHY 09/19/2007   CHOLECYSTECTOMY, HX OF 05/28/2006   PNEUMONIA, HX OF 02/27/2006   ARTHROSCOPY, KNEE, HX OF 02/27/2006    Patient's Medications  New Prescriptions   No medications on file  Previous Medications   ASCORBIC ACID (VITAMIN C) 1000 MG TABLET    Take 1,000 mg by mouth daily at 6 PM.   ASPIRIN EC 81 MG TABLET    Take 1 tablet (81 mg total) by mouth daily.   CHOLECALCIFEROL (VITAMIN D3) 125 MCG (5000 UT) TABS    Take 5,000 Units by mouth daily at 6 PM.   CLOPIDOGREL (PLAVIX) 75 MG TABLET    Take 1 tablet by mouth once daily   FINASTERIDE (PROSCAR) 5 MG TABLET    Take 5 mg by mouth daily.   LOSARTAN (COZAAR) 50 MG TABLET    Take 50 mg by mouth daily. Started by his kidney doctor- Dr. Candiss Norse - [facility]   MYRBETRIQ 50 MG TB24 TABLET       PANTOPRAZOLE  (PROTONIX) 20 MG TABLET    Take 2 tablets (40 mg total) by mouth daily.   ROSUVASTATIN (CRESTOR) 20 MG TABLET    Take 1 tablet by mouth once daily   VITAMIN B-12 (CYANOCOBALAMIN) 500 MCG TABLET    Take 500 mcg by mouth daily.   ZINC 50 MG TABS    Take 50 mg by mouth daily at 6 PM.  Modified Medications   Modified Medication Previous Medication   ABACAVIR-DOLUTEGRAVIR-LAMIVUDINE (TRIUMEQ) 600-50-300 MG TABLET abacavir-dolutegravir-lamiVUDine (TRIUMEQ) 600-50-300 MG tablet      Take 1 tablet by mouth daily.    Take 1 tablet by mouth daily.  Discontinued Medications   No medications on file    Subjective: Jeffrey [name] Is in for his routine HIV follow-up visit.  He has not had any problems obtaining, taking or tolerating his Triumeq and does not recall missing doses.  He says that he has not been sexually active for many years but he was diagnosed with early syphilis in February after developing a diffuse rash.  He was treated with penicillin and his rash resolved promptly.  He was hospitalized in May with E. coli colitis but has recovered without any residual side effects.  He continues to be followed for his chronic renal insufficiency.  He has also been having  some swelling and tenderness around his left nipple.  He is scheduled for a mammogram later this afternoon.  Review of Systems: Review of Systems  Constitutional:  Negative for fever and weight loss.  Respiratory:  Negative for cough.   Cardiovascular:  Negative for chest pain.  Gastrointestinal:  Negative for diarrhea, nausea and vomiting.  Skin:  Negative for rash.  Psychiatric/Behavioral:  Negative for depression.    Past Medical History:  Diagnosis Date   Chronic kidney disease    Colon polyps    COVID-19    05/2019   GERD (gastroesophageal reflux disease)    Heart murmur    History of kidney stones    HIV (human immunodeficiency virus infection) ([place]) 1999   Hypertension    Pneumonia    Stroke ([place])    right side weakness    TIA (transient ischemic attack)     Social History   Tobacco Use   Smoking status: Never   Smokeless tobacco: Former    Types: Chew    Quit date: 2019  Vaping Use   Vaping Use: Never used  Substance Use Topics   Alcohol use: Yes    Comment: rare - few per year   Drug use: No    Family History  Problem Relation Age of Onset   Hypertension Mother    Heart attack Mother 19   Alzheimer's disease Sister    Heart disease Sister    Alzheimer's disease Sister    Drug abuse Sister    COPD Brother    Kidney cancer Brother    Colon cancer Neg Hx    Esophageal cancer Neg Hx    Pancreatic cancer Neg Hx    Stomach cancer Neg Hx    Liver disease Neg Hx     Allergies  Allergen Reactions   Baclofen Other (See Comments)    Back pain. Patient said it made him feel crazy    Efavirenz Rash   Nevirapine Rash and Other (See Comments)    Health Maintenance  Topic Date Due   Zoster Vaccines- Shingrix (1 of 2) Never done   COLONOSCOPY (Pts 45-80yr Insurance coverage will need to be confirmed)  08/03/2020   COVID-19 Vaccine (3 - Moderna risk series) 03/16/2021 (Originally 11/12/2019)   TETANUS/TDAP  01/05/2025   Pneumonia Vaccine 75 Years old  Completed   INFLUENZA VACCINE  Completed   Hepatitis C Screening  Completed   HPV VACCINES  Aged Out    Objective:  Vitals:   03/03/21 0946  BP: 118/78  Pulse: 72  Resp: 16  Temp: 98 F (36.7 C)  TempSrc: Temporal  SpO2: 98%  Weight: 169 lb (76.7 kg)  Height: 5' 10"  (1.778 m)   Body mass index is 24.25 kg/m.  Physical Exam Constitutional:      Comments: He is in good spirits.  Cardiovascular:     Rate and Rhythm: Normal rate.  Pulmonary:     Effort: Pulmonary effort is normal.  Chest:    Skin:    Findings: No rash.  Psychiatric:        Mood and Affect: Mood normal.    Lab Results Lab Results  Component Value Date   WBC 10.7 02/17/2021   HGB 14.8 02/17/2021   HCT 43.1 02/17/2021   MCV 98.0 02/17/2021   PLT 189  02/17/2021    Lab Results  Component Value Date   CREATININE 1.54 (H) 02/17/2021   BUN 30 (H) 02/17/2021   NA 141 02/17/2021  K 4.1 02/17/2021   CL 105 02/17/2021   CO2 27 02/17/2021    Lab Results  Component Value Date   ALT 15 02/17/2021   AST 13 02/17/2021   ALKPHOS 63 09/06/2020   BILITOT 0.3 02/17/2021    Lab Results  Component Value Date   CHOL 131 02/17/2021   HDL 43 02/17/2021   LDLCALC 63 02/17/2021   TRIG 186 (H) 02/17/2021   CHOLHDL 3.0 02/17/2021   Lab Results  Component Value Date   LABRPR NON-REACTIVE 02/17/2021   HIV 1 RNA Quant  Date Value  02/17/2021 35 Copies/mL (H)  09/07/2020 <20 copies/mL  02/10/2020 <20 Copies/mL   CD4 T Cell Abs (/uL)  Date Value  02/17/2021 396 (L)  02/10/2020 469  02/10/2019 567     Problem List Items Addressed This Visit       1.   Human immunodeficiency virus (HIV) disease ([place])    His infection remains under good, long-term control.  He will continue Triumeq and follow-up after lab work in 1 year.  He has already received his annual influenza vaccine and is scheduled for his COVID booster vaccine next month.      Relevant Medications   abacavir-dolutegravir-lamiVUDine (TRIUMEQ) 600-50-300 MG tablet   Other Relevant Orders   CBC   T-helper cell (CD4)- ([facility] clinic only)   Comprehensive metabolic panel   Lipid panel   RPR   HIV-1 RNA quant-no reflex-bld     Unprioritized   Gynecomastia    Gynecomastia can be seen occasionally in well-controlled HIV infected patients.  I will await the results of his mammogram.         [name], MD [facility] [place] [phone] pager   [phone] cell 03/03/2021, 10:04 AM

## 2021-03-03 NOTE — Assessment & Plan Note (Signed)
His infection remains under good, long-term control.  He will continue Triumeq and follow-up after lab work in 1 year.  He has already received his annual influenza vaccine and is scheduled for his COVID booster vaccine next month.

## 2021-03-04 ENCOUNTER — Telehealth: Payer: Self-pay | Admitting: Family Medicine

## 2021-03-04 NOTE — Telephone Encounter (Signed)
HIPAA compliant callback message left.   Please advise him that his mammogram is negative for cancer. He does have gynecomastia (enlarged breast) as previously discussed and this is likely related to his Proscar.  For now, he may use Tylenol as needed for breast pain and f/u with PCP if symptoms worsens.

## 2021-03-04 NOTE — Telephone Encounter (Signed)
Patient returns call to nurse line. Advised of below.   Talbot Grumbling, RN

## 2021-04-06 ENCOUNTER — Other Ambulatory Visit: Payer: Self-pay

## 2021-04-06 ENCOUNTER — Ambulatory Visit (INDEPENDENT_AMBULATORY_CARE_PROVIDER_SITE_OTHER): Payer: Medicare HMO | Admitting: Family Medicine

## 2021-04-06 VITALS — BP 119/78 | HR 75 | Ht 70.0 in | Wt 172.2 lb

## 2021-04-06 DIAGNOSIS — M5481 Occipital neuralgia: Secondary | ICD-10-CM | POA: Diagnosis not present

## 2021-04-06 NOTE — Patient Instructions (Signed)
It was great seeing you today!  Today we discussed your neck pain, I am sorry that you are not doing well. I believe this is due to nerve pain in your head and neck causing back pain. I have placed a referral to neurology, please maintain follow up with them.  Please follow up at your next scheduled appointment, if anything arises between now and then, please don't hesitate to contact our office.   Thank you for allowing [place] to be a part of your medical care!  Thank you, Dr. Larae Grooms

## 2021-04-06 NOTE — Assessment & Plan Note (Signed)
-  given history, symptoms and exam, most consistent with occipital neuralgia, low concern for cervical radiculopathy or MSK related etiology  -neurology referral placed to [facility], Dr. Felecia Shelling, instructed patient to call our office if he does not hear from anyone within a week to ensure referral is successful, patient would likely benefit from botox injections  -avoid NSAIDs given CKD 3 -repeat imaging Feb 2023 or per neurology  -follow up as appropriate

## 2021-04-06 NOTE — Progress Notes (Signed)
    SUBJECTIVE:   Patient presents with ongoing neck pain over the past few months that has worsened. He reports that it is most painful along the back of his head and along the side of his neck bilaterally. Denies any numbness or tingling. Denies any other radiating pain. During these episodes of neck pain, he also gets a headache along the back of his head. Denies vision changes, photophobia, nausea and vomiting. Shower makes it better and nothing really makes it worse. He has tried using a heating pad in the past but was told not to use one as instructed by the neurologist. MRA from August 2022 notable for multifocal atheromatous narrowing without progression from 2021, right ICA narrowing and mild tight PCA bifurcation stenosis which is negative for an aneurysm. He shares that he is scheduled for imaging in February. Endorses that this has occurred before and at the time he saw a neurologist who gave him botox injections which helped. He has not contacted the neurology office since he does not have the contact information but was directed to be seen at our clinic instead. He wants to be seen by neurology outpatient as well.   OBJECTIVE:   BP 119/78   Pulse 75   Ht 5' 10"  (1.778 m)   Wt 172 lb 3.2 oz (78.1 kg)   SpO2 99%   BMI 24.71 kg/m   General: Patient well-appearing, in no acute distress. CV: RRR, no murmurs or gallops auscultated Resp: CTAB Ext: radial pulses strong and equal bilaterally  MSK: slight restriction of neck ROM to the left, otherwise normal ROM along neck, tenderness on palpation of lateral neck bilaterally, no rashes or lesions noted, tenderness along occipital region, negative Spurling's testing bilaterally   Neuro: gross sensation intact, CN 2-12 grossly intact, 5/5 UE strength noted bilaterally   ASSESSMENT/PLAN:   Bilateral occipital neuralgia -given history, symptoms and exam, most consistent with occipital neuralgia, low concern for cervical radiculopathy or MSK  related etiology  -neurology referral placed to [facility], Dr. Felecia Shelling, instructed patient to call our office if he does not hear from anyone within a week to ensure referral is successful, patient would likely benefit from botox injections  -avoid NSAIDs given CKD 3 -repeat imaging Feb 2023 or per neurology  -follow up as appropriate      Donney Dice, [place]

## 2021-04-11 ENCOUNTER — Telehealth: Payer: Self-pay

## 2021-04-11 NOTE — Telephone Encounter (Signed)
error 

## 2021-04-12 ENCOUNTER — Encounter: Payer: Self-pay | Admitting: Neurology

## 2021-04-12 ENCOUNTER — Ambulatory Visit (INDEPENDENT_AMBULATORY_CARE_PROVIDER_SITE_OTHER): Payer: Medicare HMO | Admitting: Neurology

## 2021-04-12 VITALS — BP 150/82 | HR 74 | Ht 70.0 in | Wt 172.8 lb

## 2021-04-12 DIAGNOSIS — I6521 Occlusion and stenosis of right carotid artery: Secondary | ICD-10-CM | POA: Diagnosis not present

## 2021-04-12 DIAGNOSIS — M5481 Occipital neuralgia: Secondary | ICD-10-CM | POA: Diagnosis not present

## 2021-04-12 DIAGNOSIS — G4489 Other headache syndrome: Secondary | ICD-10-CM | POA: Diagnosis not present

## 2021-04-12 DIAGNOSIS — M542 Cervicalgia: Secondary | ICD-10-CM

## 2021-04-12 DIAGNOSIS — B20 Human immunodeficiency virus [HIV] disease: Secondary | ICD-10-CM | POA: Diagnosis not present

## 2021-04-12 NOTE — Progress Notes (Signed)
[facility]  PATIENT: Jeffrey [name] DOB: [birth year]  REFERRING DOCTOR OR PCP: Theotis Burrow, MD; Mina Marble (PCP) SOURCE: Patient, notes from primary care  _________________________________   HISTORICAL  CHIEF COMPLAINT:  Chief Complaint  Patient presents with   Consult    Rm 16, alone. Internal referral for bilateral occipital neuralgia. Pt continues to have neck pn. Pn is mainly on left side, unable to look to his left at times. Pn runs down to L shoulder.     HISTORY OF PRESENT ILLNESS:  Jeffrey [name] is a 75 - year-old man with HA.  Update 04/12/2021. He reports a headache and left > right sided neck pain.   The neck is stiff, turning to left is painful and restricted.    Pain is similar to pain he had 12/2019 when we did an occipital nerve block/ splenius capitus injeciton.    That helped remndously x 4 months and then pan gradually came back,, worsening a lot last momth associated with the stiffness.      Last year pain was right > left but now is left > right.   He has no change in vision.  He had balloon angioplasty supraclinoid ICA 05/14/2019.  An MRA 10/30/2019 showed 60% paraclinoid segment stenosis of the right ICA and moderate stenosis of the M2 segment of the right MCA and left MCA, and moderate stenosis of the left PCA.     He takes Plavix x 1 year   CT scan 12/16/19 showed mild SVID and expanded Tawny Hopping Robbin space next to the basal ganglia on the left   He is HIV positive.  He has stage 3 chronic renal disease.   Creatinine is 1.54.     REVIEW OF SYSTEMS: Constitutional: No fevers, chills, sweats, or change in appetite Eyes: No visual changes, double vision, eye pain Ear, nose and throat: No hearing loss, ear pain, nasal congestion, sore throat Cardiovascular: No chest pain, palpitations Respiratory:  No shortness of breath at rest or with exertion.   No wheezes GastrointestinaI: No nausea, vomiting, diarrhea, abdominal pain, fecal  incontinence Genitourinary:  No dysuria, urinary retention or frequency.  No nocturia. Musculoskeletal:  No neck pain, back pain Integumentary: No rash, pruritus, skin lesions Neurological: as above Psychiatric: No depression at this time.  No anxiety Endocrine: No palpitations, diaphoresis, change in appetite, change in weigh or increased thirst Hematologic/Lymphatic:  No anemia, purpura, petechiae. Allergic/Immunologic: No itchy/runny eyes, nasal congestion, recent allergic reactions, rashes  ALLERGIES: Allergies  Allergen Reactions   Baclofen Other (See Comments)    Back pain. Patient said it made him feel crazy    Efavirenz Rash   Nevirapine Rash and Other (See Comments)    HOME MEDICATIONS:  Current Outpatient Medications:    abacavir-dolutegravir-lamiVUDine (TRIUMEQ) 600-50-300 MG tablet, Take 1 tablet by mouth daily., Disp: 30 tablet, Rfl: 11   Ascorbic Acid (VITAMIN C) 1000 MG tablet, Take 1,000 mg by mouth daily at 6 PM., Disp: , Rfl:    aspirin EC 81 MG tablet, Take 1 tablet (81 mg total) by mouth daily., Disp: , Rfl:    Cholecalciferol (VITAMIN D3) 125 MCG (5000 UT) TABS, Take 5,000 Units by mouth daily at 6 PM., Disp: , Rfl:    clopidogrel (PLAVIX) 75 MG tablet, Take 1 tablet by mouth once daily, Disp: 90 tablet, Rfl: 3   finasteride (PROSCAR) 5 MG tablet, Take 5 mg by mouth daily., Disp: , Rfl:    losartan (COZAAR) 50 MG tablet, Take 50  mg by mouth daily. Started by his kidney doctor- Dr. Candiss Norse - [facility], Disp: , Rfl:    MYRBETRIQ 50 MG TB24 tablet, , Disp: , Rfl:    pantoprazole (PROTONIX) 20 MG tablet, Take 2 tablets (40 mg total) by mouth daily., Disp: 60 tablet, Rfl: 5   rosuvastatin (CRESTOR) 20 MG tablet, Take 1 tablet by mouth once daily, Disp: 90 tablet, Rfl: 2   vitamin B-12 (CYANOCOBALAMIN) 500 MCG tablet, Take 500 mcg by mouth daily., Disp: , Rfl:    Zinc 50 MG TABS, Take 50 mg by mouth daily at 6 PM., Disp: , Rfl:   PAST MEDICAL HISTORY: Past  Medical History:  Diagnosis Date   Chronic kidney disease    Colon polyps    COVID-19    05/2019   GERD (gastroesophageal reflux disease)    Heart murmur    History of kidney stones    HIV (human immunodeficiency virus infection) ([place]) 1999   Hypertension    Pneumonia    Stroke ([place])    right side weakness   TIA (transient ischemic attack)     PAST SURGICAL HISTORY: Past Surgical History:  Procedure Laterality Date   CHOLECYSTECTOMY  1982   COLONOSCOPY     ESOPHAGEAL MANOMETRY N/A 09/03/2017   Procedure: ESOPHAGEAL MANOMETRY (EM);  Surgeon: Mauri Pole, MD;  Location: [facility];  Service: Endoscopy;  Laterality: N/A;   IR ANGIO INTRA EXTRACRAN SEL COM CAROTID INNOMINATE BILAT MOD SED  04/21/2019   IR ANGIO INTRA EXTRACRAN SEL COM CAROTID INNOMINATE BILAT MOD SED  05/11/2020   IR ANGIO VERTEBRAL SEL VERTEBRAL BILAT MOD SED  04/21/2019   IR ANGIO VERTEBRAL SEL VERTEBRAL BILAT MOD SED  05/11/2020   IR PTA INTRACRANIAL  05/14/2019   IR US GUIDE VASC ACCESS RIGHT  05/11/2020   KNEE SURGERY     LEFT KNEE   RADIOLOGY WITH ANESTHESIA N/A 05/14/2019   Procedure: IR WITH ANESTHESIA STENTING;  Surgeon: Luanne Bras, MD;  Location: [facility];  Service: Radiology;  Laterality: N/A;   TONSILLECTOMY      FAMILY HISTORY: Family History  Problem Relation Age of Onset   Hypertension Mother    Heart attack Mother 42   Alzheimer's disease Sister    Heart disease Sister    Alzheimer's disease Sister    Drug abuse Sister    COPD Brother    Kidney cancer Brother    Colon cancer Neg Hx    Esophageal cancer Neg Hx    Pancreatic cancer Neg Hx    Stomach cancer Neg Hx    Liver disease Neg Hx     SOCIAL HISTORY:  Social History   Socioeconomic History   Marital status: Divorced    Spouse name: Not on file   Number of children: 2   Years of education: 14   Highest education level: Some college, no degree  Occupational History   Occupation: retired    Fish farm manager: [employer]   Tobacco Use   Smoking status: Never   Smokeless tobacco: Former    Types: Chew    Quit date: 2019  Vaping Use   Vaping Use: Never used  Substance and Sexual Activity   Alcohol use: Yes    Comment: rare - few per year   Drug use: No   Sexual activity: Not Currently    Partners: Female    Comment: declined condoms  Other Topics Concern   Not on file  Social History Narrative   Lives alone  Right handed    Caffeine use: tea, mainly water      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, environmental   2 children - 1 boy and 1 girl   Social Determinants of Radio broadcast assistant Strain: Low Risk    Difficulty of Paying Living Expenses: Not hard at all  Food Insecurity: No Food Insecurity   Worried About Charity fundraiser in the Last Year: Never true   Arboriculturist in the Last Year: Never true  Transportation Needs: No Transportation Needs   Lack of Transportation (Medical): No   Lack of Transportation (Non-Medical): No  Physical Activity: Sufficiently Active   Days of Exercise per Week: 5 days   Minutes of Exercise per Session: 30 min  Stress: No Stress Concern Present   Feeling of Stress : Only a little  Social Connections: Moderately Isolated   Frequency of Communication with Friends and Family: Twice a week   Frequency of Social Gatherings with Friends and Family: Once a week   Attends Religious Services: Never   Marine scientist or Organizations: Yes   Attends Archivist Meetings: Never   Marital Status: Divorced  Human resources officer Violence: Not At Risk   Fear of Current or Ex-Partner: No   Emotionally Abused: No   Physically Abused: No   Sexually Abused: No     PHYSICAL EXAM  Vitals:   04/12/21 1551  BP: (!) 150/82  Pulse: 74  Weight: 172 lb 12.8 oz (78.4 kg)  Height: 5' 10"  (1.778 m)    Body mass index is 24.79 kg/m.   General: The patient is well-developed and well-nourished and in no acute distress  HEENT:  Head is [place]/AT.  Sclera are  anicteric.  Funduscopic examination is normal.  Neck: The neck is tender at the left greater than right occiput..  Reduced range of motion looking to the left   Neurologic Exam  Mental status: The patient is alert and oriented x 3 at the time of the examination. The patient has apparent normal recent and remote memory, with an apparently normal attention span and concentration ability.   Speech is normal.  Cranial nerves: Extraocular movements are full.   Facial symmetry is present.  Facial strength and sensation was normal.  No obvious hearing deficits are noted.  Motor:  Muscle bulk is normal.   Tone is normal. Strength is  5 / 5 in all 4 extremities.   Sensory: Sensory testing is intact to pinprick, soft touch and vibration sensation in all 4 extremities.  Coordination: Cerebellar testing reveals good finger-nose-finger and heel-to-shin bilaterally.  Gait and station: Station is normal.   Gait is normal. Tandem gait is mildly wide. Romberg is negative.   Reflexes: Deep tendon reflexes are symmetric and normal bilaterally.  Marland Kitchen    DIAGNOSTIC DATA (LABS, IMAGING, TESTING) - I reviewed patient records, labs, notes, testing and imaging myself where available.  Lab Results  Component Value Date   WBC 10.7 02/17/2021   HGB 14.8 02/17/2021   HCT 43.1 02/17/2021   MCV 98.0 02/17/2021   PLT 189 02/17/2021      Component Value Date/Time   NA 141 02/17/2021 0954   NA 139 09/13/2020 1718   K 4.1 02/17/2021 0954   CL 105 02/17/2021 0954   CO2 27 02/17/2021 0954   GLUCOSE 100 (H) 02/17/2021 0954   BUN 30 (H) 02/17/2021 0954   BUN 11 09/13/2020 1718   CREATININE 1.54 (H)  02/17/2021 0954   CALCIUM 9.7 02/17/2021 0954   PROT 7.1 02/17/2021 0954   PROT 6.4 01/26/2020 0805   ALBUMIN 2.5 (L) 09/06/2020 1150   ALBUMIN 4.3 01/26/2020 0805   AST 13 02/17/2021 0954   ALT 15 02/17/2021 0954   ALKPHOS 63 09/06/2020 1150   BILITOT 0.3 02/17/2021 0954   BILITOT 0.4 01/26/2020 0805    GFRNONAA 57 (L) 09/09/2020 0130   GFRNONAA 39 (L) 10/16/2014 1421   GFRAA >60 12/16/2019 1826   GFRAA 45 (L) 10/16/2014 1421   Lab Results  Component Value Date   CHOL 131 02/17/2021   HDL 43 02/17/2021   LDLCALC 63 02/17/2021   TRIG 186 (H) 02/17/2021   CHOLHDL 3.0 02/17/2021   Lab Results  Component Value Date   HGBA1C 5.5 09/21/2020        ASSESSMENT AND PLAN  Occipital neuralgia of right side  Other headache syndrome  Neck pain  Internal carotid artery stenosis, right  Human immunodeficiency virus (HIV) disease ([place])  In summary, Mr. Symanski is a 75 year old man with headache off-and-on over the last month that intensified 8 days ago.  The characteristics of the headache are most consistent with right occipital neuralgia.  1.   Bilateral splenius capitus and C3C4 paraspinal muscle trigger point injections with 80 mg Depo-Medrol in [place] using sterile technique.     2.    If pain returns, consider nortriptyline 25 mg 3..   RTC as needed.     Thank you for asking me to see Mr. Stayer.  Please let me know if I can be of further assistance with him or the patients in the future.  Promise Bushong A. Felecia Shelling, MD, [facility] 38/06/5051, 9:76 PM Certified in Neurology, Clinical Neurophysiology, Sleep Medicine and Neuroimaging  [facility] [address], [place], [place] [address] [phone]

## 2021-05-24 DIAGNOSIS — N1831 Chronic kidney disease, stage 3a: Secondary | ICD-10-CM | POA: Diagnosis not present

## 2021-06-01 DIAGNOSIS — I110 Hypertensive heart disease with heart failure: Secondary | ICD-10-CM | POA: Diagnosis not present

## 2021-06-01 DIAGNOSIS — N1831 Chronic kidney disease, stage 3a: Secondary | ICD-10-CM | POA: Diagnosis not present

## 2021-06-01 DIAGNOSIS — D631 Anemia in chronic kidney disease: Secondary | ICD-10-CM | POA: Diagnosis not present

## 2021-06-01 DIAGNOSIS — I129 Hypertensive chronic kidney disease with stage 1 through stage 4 chronic kidney disease, or unspecified chronic kidney disease: Secondary | ICD-10-CM | POA: Diagnosis not present

## 2021-06-01 DIAGNOSIS — I509 Heart failure, unspecified: Secondary | ICD-10-CM | POA: Diagnosis not present

## 2021-06-01 DIAGNOSIS — R809 Proteinuria, unspecified: Secondary | ICD-10-CM | POA: Diagnosis not present

## 2021-06-01 DIAGNOSIS — N2581 Secondary hyperparathyroidism of renal origin: Secondary | ICD-10-CM | POA: Diagnosis not present

## 2021-06-16 ENCOUNTER — Institutional Professional Consult (permissible substitution): Payer: Medicare HMO | Admitting: Neurology

## 2021-07-17 ENCOUNTER — Other Ambulatory Visit: Payer: Self-pay | Admitting: Gastroenterology

## 2021-07-27 ENCOUNTER — Other Ambulatory Visit (HOSPITAL_COMMUNITY): Payer: Self-pay | Admitting: Interventional Radiology

## 2021-07-27 ENCOUNTER — Telehealth (HOSPITAL_COMMUNITY): Payer: Self-pay

## 2021-07-27 DIAGNOSIS — I771 Stricture of artery: Secondary | ICD-10-CM

## 2021-07-27 NOTE — Telephone Encounter (Signed)
Called to schedule mra, no answer, left vm. AW  ?

## 2021-09-06 ENCOUNTER — Telehealth (HOSPITAL_COMMUNITY): Payer: Self-pay

## 2021-09-06 NOTE — Telephone Encounter (Signed)
Called to schedule mra, no answer, left vm. AW  ?

## 2021-09-07 ENCOUNTER — Ambulatory Visit (INDEPENDENT_AMBULATORY_CARE_PROVIDER_SITE_OTHER): Payer: Medicare HMO | Admitting: Student

## 2021-09-07 ENCOUNTER — Encounter: Payer: Self-pay | Admitting: Student

## 2021-09-07 VITALS — BP 140/88 | HR 75 | Ht 70.0 in | Wt 172.4 lb

## 2021-09-07 DIAGNOSIS — G4489 Other headache syndrome: Secondary | ICD-10-CM | POA: Diagnosis not present

## 2021-09-07 DIAGNOSIS — H538 Other visual disturbances: Secondary | ICD-10-CM | POA: Diagnosis not present

## 2021-09-07 DIAGNOSIS — Z113 Encounter for screening for infections with a predominantly sexual mode of transmission: Secondary | ICD-10-CM | POA: Diagnosis not present

## 2021-09-07 DIAGNOSIS — R42 Dizziness and giddiness: Secondary | ICD-10-CM | POA: Insufficient documentation

## 2021-09-07 DIAGNOSIS — I509 Heart failure, unspecified: Secondary | ICD-10-CM | POA: Diagnosis not present

## 2021-09-07 NOTE — Patient Instructions (Signed)
It was great to see you today! Thank you for choosing [facility] for your primary care. Jeffrey [name] was seen for dizzy spells with vision blurriness and headaches. ? ?Today we addressed: ?I am concerned of new onset strokes which may be affecting.  I would like to get blood work and an MRI and have you follow-up with neurology given your previous symptomatology.  He has more of the combination of dizzy spells and blurry vision that concerns me.  Please contact your neurologist to have an appointment sooner than you currently have scheduled. ? ? ?Orders Placed This Encounter  ?Procedures  ? MR Brain W Wo Contrast  ?  Standing Status:   Future  ?  Standing Expiration Date:   09/08/2022  ?  Order Specific Question:   If indicated for the ordered procedure, I authorize the administration of contrast media per Radiology protocol  ?  Answer:   Yes  ?  Order Specific Question:   What is the patient's sedation requirement?  ?  Answer:   No Sedation  ?  Order Specific Question:   Does the patient have a pacemaker or implanted devices?  ?  Answer:   No  ?  Order Specific Question:   Preferred imaging location?  ?  Answer:   [facility] (table limit - 500 lbs)  ? Basic Metabolic Panel  ? CBC with Differential  ? TSH  ? RPR  ? ?No orders of the defined types were placed in this encounter. ? ? ?We are checking some labs today. If they are abnormal, I will call you. If they are normal, I will send you a MyChart message (if it is active) or a letter in the mail. If you do not hear about your labs in the next 2 weeks, please call the office. ? ?You should return to our clinic No follow-ups on file.. ? ?I recommend that you always bring your medications to each appointment as this makes it easy to ensure you are on the correct medications and helps [place] not miss refills when you need them. ? ?Please arrive 15 minutes before your appointment to ensure smooth check in process.  We appreciate your efforts in making  this happen. ? ?Take care and seek immediate care sooner if you develop any concerns.  ? ?Thank you for allowing me to participate in your care, ?Wells Guiles, DO ?09/07/2021, 4:17 PM ?PGY-1, [place] ?  ?

## 2021-09-07 NOTE — Progress Notes (Signed)
SUBJECTIVE:   CHIEF COMPLAINT / HPI:   Patient presents with dizzy spells, blurry vision, headaches which are never at the same time.  Dizzy spells: Denies room spinning sensation but are disorienting enough they lead him to fall. He notes it causes him to lose balance and fall sometimes. It comes on immediately, couple times a week. Notes he has good water intake (5 bottles a day at least). Denies accompanying nausea.  Patient notes he has fallen 3 times in the last 2 weeks and is oriented when this happens. Denies tinnitus.  Blurry vision: He notes he gets blurry vision sometimes as well (3x/week). Blurry vision lasts 3-5 min. Denies vision loss. Notes usually his right eye blurriness will clear before the left eye. Denies photophobia.   Headaches: These present on the right, back portion of his head (always the same location).  They usually come on at night, describes it as a thumping pain. Gets these 3-4x/week. He takes tylenol 1014m which resolves the headaches.  These symptoms have been going on for about 4 months. He checks his BP at home: ranges from 110s-140s/70-80s.  He notes he had headaches after the balloon surgery he had in the past.  PERTINENT  PMH / PSH: HTN, HIV, CKD, GERD, occipital neuralgia, stroke  OBJECTIVE:  BP 140/88   Pulse 75   Ht 5' 10"  (1.778 m)   Wt 172 lb 6.4 oz (78.2 kg)   SpO2 94%   BMI 24.74 kg/m   General: NAD, pleasant, able to participate in exam Cardiac: RRR, no murmurs auscultated. Respiratory: CTAB, normal effort, no wheezes, rales or rhonchi Extremities: warm and well perfused, no edema or cyanosis.  4/5 strength of left upper extremity (residual from stroke) Skin: warm and dry, no rashes noted Neuro: alert, no obvious focal deficits, speech normal, CN II-X intact with exception of trace facial numbness of left side (residual from stroke) and CN VIII (unable to elicit) Psych: Normal affect and mood  ASSESSMENT/PLAN:  Dizziness Patient  presents with myriad of spontaneous onset dizzy spells, blurry vision, headaches which do not overlap each other.  Additionally concerning given frequency of falls due to dizzy spells.  History not consistent with syncopal events or cardiac etiology. Given that headaches resolved with minimal Tylenol use, most concerning signs at this time are the dizzy spells and blurry vision.  These may represent ophthalmic migraine although would suspect these to have long-lasting effects and not easily improved with Tylenol consistently. Blood pressure appears well controlled based on home measures (elevated today to 140/88) however, in the setting of history of stroke, CAD, symptoms may be consistent with TIA.  Further, HIV appears well controlled per chart review and given chronicity, less likely to be infectious etiology.  Will obtain BMP, CBC with differential, RPR, TSH and MR brain with and without contrast.  Advised patient to call neurology and attempt to get earlier appointment than currently planned.  Blurry vision, bilateral See plan under dizziness.   Orders Placed This Encounter  Procedures   MR Brain W Wo Contrast    Standing Status:   Future    Standing Expiration Date:   09/08/2022    Order Specific Question:   If indicated for the ordered procedure, I authorize the administration of contrast media per Radiology protocol    Answer:   Yes    Order Specific Question:   What is the patient's sedation requirement?    Answer:   No Sedation    Order Specific Question:  Does the patient have a pacemaker or implanted devices?    Answer:   No    Order Specific Question:   Preferred imaging location?    Answer:   [facility] (table limit - 500 lbs)   Basic Metabolic Panel   CBC with Differential   TSH   RPR   Wells Guiles, DO 09/08/2021, 8:16 AM PGY-1, [place]

## 2021-09-08 ENCOUNTER — Telehealth: Payer: Self-pay | Admitting: Student

## 2021-09-08 LAB — CBC WITH DIFFERENTIAL/PLATELET
Basophils Absolute: 0 10*3/uL (ref 0.0–0.2)
Basos: 0 %
EOS (ABSOLUTE): 0 10*3/uL (ref 0.0–0.4)
Eos: 1 %
Hematocrit: 39.5 % (ref 37.5–51.0)
Hemoglobin: 13.9 g/dL (ref 13.0–17.7)
Immature Grans (Abs): 0 10*3/uL (ref 0.0–0.1)
Immature Granulocytes: 0 %
Lymphocytes Absolute: 2.2 10*3/uL (ref 0.7–3.1)
Lymphs: 42 %
MCH: 34.3 pg — ABNORMAL HIGH (ref 26.6–33.0)
MCHC: 35.2 g/dL (ref 31.5–35.7)
MCV: 98 fL — ABNORMAL HIGH (ref 79–97)
Monocytes Absolute: 0.4 10*3/uL (ref 0.1–0.9)
Monocytes: 8 %
Neutrophils Absolute: 2.5 10*3/uL (ref 1.4–7.0)
Neutrophils: 49 %
Platelets: 177 10*3/uL (ref 150–450)
RBC: 4.05 x10E6/uL — ABNORMAL LOW (ref 4.14–5.80)
RDW: 12.8 % (ref 11.6–15.4)
WBC: 5.1 10*3/uL (ref 3.4–10.8)

## 2021-09-08 LAB — BASIC METABOLIC PANEL
BUN/Creatinine Ratio: 17 (ref 10–24)
BUN: 24 mg/dL (ref 8–27)
CO2: 21 mmol/L (ref 20–29)
Calcium: 9.3 mg/dL (ref 8.6–10.2)
Chloride: 104 mmol/L (ref 96–106)
Creatinine, Ser: 1.42 mg/dL — ABNORMAL HIGH (ref 0.76–1.27)
Glucose: 96 mg/dL (ref 70–99)
Potassium: 4.1 mmol/L (ref 3.5–5.2)
Sodium: 141 mmol/L (ref 134–144)
eGFR: 52 mL/min/{1.73_m2} — ABNORMAL LOW (ref >59)

## 2021-09-08 LAB — TSH: TSH: 1.86 u[IU]/mL (ref 0.450–4.500)

## 2021-09-08 LAB — RPR: RPR Ser Ql: NONREACTIVE

## 2021-09-08 NOTE — Assessment & Plan Note (Addendum)
Patient presents with myriad of spontaneous onset dizzy spells, blurry vision, headaches which do not overlap each other.  Additionally concerning given frequency of falls due to dizzy spells.  History not consistent with syncopal events or cardiac etiology. Given that headaches resolved with minimal Tylenol use, most concerning signs at this time are the dizzy spells and blurry vision.  These may represent ophthalmic migraine although would suspect these to have long-lasting effects and not easily improved with Tylenol consistently. Blood pressure appears well controlled based on home measures (elevated today to 140/88) however, in the setting of history of stroke, CAD, symptoms may be consistent with TIA.  Further, HIV appears well controlled per chart review and given chronicity, less likely to be infectious etiology.  Will obtain BMP, CBC with differential, RPR, TSH and MR brain with and without contrast.  Advised patient to call neurology and attempt to get earlier appointment than currently planned. ?

## 2021-09-08 NOTE — Telephone Encounter (Signed)
Discussed nonconcerning lab results with patient.  Advised calling neurology today for earlier appointment and still proceed with MRI brain. ?

## 2021-09-08 NOTE — Assessment & Plan Note (Signed)
See plan under dizziness. ?

## 2021-09-12 ENCOUNTER — Other Ambulatory Visit: Payer: Self-pay

## 2021-09-30 ENCOUNTER — Ambulatory Visit (HOSPITAL_COMMUNITY)
Admission: RE | Admit: 2021-09-30 | Discharge: 2021-09-30 | Disposition: A | Discharge status: Home / Self Care | Payer: Medicare HMO | Source: Ambulatory Visit | Attending: Family Medicine | Admitting: Family Medicine

## 2021-09-30 DIAGNOSIS — I6622 Occlusion and stenosis of left posterior cerebral artery: Secondary | ICD-10-CM | POA: Insufficient documentation

## 2021-09-30 DIAGNOSIS — I771 Stricture of artery: Secondary | ICD-10-CM | POA: Insufficient documentation

## 2021-09-30 DIAGNOSIS — R42 Dizziness and giddiness: Secondary | ICD-10-CM | POA: Diagnosis not present

## 2021-09-30 DIAGNOSIS — I6523 Occlusion and stenosis of bilateral carotid arteries: Secondary | ICD-10-CM | POA: Insufficient documentation

## 2021-09-30 DIAGNOSIS — H538 Other visual disturbances: Secondary | ICD-10-CM | POA: Insufficient documentation

## 2021-09-30 DIAGNOSIS — R519 Headache, unspecified: Secondary | ICD-10-CM | POA: Diagnosis not present

## 2021-09-30 MED ORDER — GADOBUTROL 1 MMOL/ML IV SOLN
10.0000 mL | Freq: Once | INTRAVENOUS | Status: AC | PRN
  Administered 2021-09-30: 10 mL via INTRAVENOUS

## 2021-10-04 ENCOUNTER — Encounter: Payer: Self-pay | Admitting: Student

## 2021-10-05 ENCOUNTER — Telehealth (HOSPITAL_COMMUNITY): Payer: Self-pay

## 2021-10-05 NOTE — Telephone Encounter (Signed)
Pt agreed to f/u in 6 months with mra. AW

## 2021-10-10 ENCOUNTER — Other Ambulatory Visit: Payer: Self-pay | Admitting: Cardiovascular Disease

## 2021-10-10 ENCOUNTER — Other Ambulatory Visit: Payer: Self-pay | Admitting: Gastroenterology

## 2021-10-13 ENCOUNTER — Other Ambulatory Visit: Payer: Self-pay | Admitting: Gastroenterology

## 2021-10-17 NOTE — Progress Notes (Signed)
CARDIOLOGY CONSULT NOTE       Patient ID: Jeffrey [name] MRN: [id number] DOB/AGE: 05-27-1945 76 y.o.  Referring Physician: Marcella Dubs  Primary Physician: Gerrit Heck, MD Primary Cardiologist: Johnsie Cancel    HPI: 76 y.o. seen for f/u ? Diastolic dysfunction , dyspnea chest pain. History of HIV Echo 03/05/19 with EF 55-60% only impaired relaxation no significant valve disease. Cardiac CTA 03/21/19 calcium score 122 45 th percentile no obstructive disease BP improved on norvasc April 2021 had paresthesias left arm and abnormal gait. Resulted in intervention by Dr Estanislado Pandy to right supraclinoid carotid COVID positive 05/30/19 Rx with oxygen and steroids  Continues to have some cognitive decline since COVID  Continues to have some dyspnea Non contrast CT 08/04/20 with some chronic interstitial Changes with peripheral reticulation and bronchiectasis   Hospitalized 09/09/20 with diarrhea and colitis tested positive for Shigella and Rx cipro Tested positive for COVID again this admission   Lives alone. 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, advertising  Seen by [facility] 09/07/21 complaining of dizzy spells , blurry vision and headaches MRI head done 10/03/21 benign MRA no large vessel stenosis/occlusion He's had PCI of supraclinoid ICA in 2021 Sees Dr Felecia Shelling from Neurology  Discussed referral to pulmonary since he still has a lot of dyspnea not related to his heart    ROS All other systems reviewed and negative except as noted above  Past Medical History:  Diagnosis Date   Chronic kidney disease    Colon polyps    COVID-19    05/2019   GERD (gastroesophageal reflux disease)    Heart murmur    History of kidney stones    HIV (human immunodeficiency virus infection) ([place]) 1999   Hypertension    Pneumonia    Stroke ([place])    right side weakness   TIA (transient ischemic attack)     Family History  Problem Relation Age of Onset   Hypertension Mother     Heart attack Mother 8   Alzheimer's disease Sister    Heart disease Sister    Alzheimer's disease Sister    Drug abuse Sister    COPD Brother    Kidney cancer Brother    Colon cancer Neg Hx    Esophageal cancer Neg Hx    Pancreatic cancer Neg Hx    Stomach cancer Neg Hx    Liver disease Neg Hx     Social History   Socioeconomic History   Marital status: Divorced    Spouse name: Not on file   Number of children: 2   Years of education: 14   Highest education level: Some college, no degree  Occupational History   Occupation: retired    Fish farm manager: [employer]  Tobacco Use   Smoking status: Never   Smokeless tobacco: Former    Types: Chew    Quit date: 2019  Vaping Use   Vaping Use: Never used  Substance and Sexual Activity   Alcohol use: Yes    Comment: rare - few per year   Drug use: No   Sexual activity: Not Currently    Partners: Female    Comment: declined condoms  Other Topics Concern   Not on file  Social History Narrative   Lives alone   Right handed    Caffeine use: tea, mainly water      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, environmental   2 children - 1 boy and 1 girl  Social Determinants of Health   Financial Resource Strain: Low Risk  (12/18/2020)   Overall Financial Resource Strain (CARDIA)    Difficulty of Paying Living Expenses: Not hard at all  Food Insecurity: No Food Insecurity (12/18/2020)   Hunger Vital Sign    Worried About Running Out of Food in the Last Year: Never true    Ran Out of Food in the Last Year: Never true  Transportation Needs: No Transportation Needs (12/18/2020)   PRAPARE - Hydrologist (Medical): No    Lack of Transportation (Non-Medical): No  Physical Activity: Sufficiently Active (12/18/2020)   Exercise Vital Sign    Days of Exercise per Week: 5 days    Minutes of Exercise per Session: 30 min  Stress: No Stress Concern Present (12/18/2020)   [place]    Feeling of Stress : Only a little  Social Connections: Moderately Isolated (12/18/2020)   Social Connection and Isolation Panel [NHANES]    Frequency of Communication with Friends and Family: Twice a week    Frequency of Social Gatherings with Friends and Family: Once a week    Attends Religious Services: Never    Marine scientist or Organizations: Yes    Attends Archivist Meetings: Never    Marital Status: Divorced  Human resources officer Violence: Not At Risk (12/18/2020)   Humiliation, Afraid, Rape, and Kick questionnaire    Fear of Current or Ex-Partner: No    Emotionally Abused: No    Physically Abused: No    Sexually Abused: No    Past Surgical History:  Procedure Laterality Date   CHOLECYSTECTOMY  1982   COLONOSCOPY     ESOPHAGEAL MANOMETRY N/A 09/03/2017   Procedure: ESOPHAGEAL MANOMETRY (EM);  Surgeon: Mauri Pole, MD;  Location: [facility];  Service: Endoscopy;  Laterality: N/A;   IR ANGIO INTRA EXTRACRAN SEL COM CAROTID INNOMINATE BILAT MOD SED  04/21/2019   IR ANGIO INTRA EXTRACRAN SEL COM CAROTID INNOMINATE BILAT MOD SED  05/11/2020   IR ANGIO VERTEBRAL SEL VERTEBRAL BILAT MOD SED  04/21/2019   IR ANGIO VERTEBRAL SEL VERTEBRAL BILAT MOD SED  05/11/2020   IR PTA INTRACRANIAL  05/14/2019   IR US GUIDE VASC ACCESS RIGHT  05/11/2020   KNEE SURGERY     LEFT KNEE   RADIOLOGY WITH ANESTHESIA N/A 05/14/2019   Procedure: IR WITH ANESTHESIA STENTING;  Surgeon: Luanne Bras, MD;  Location: [facility];  Service: Radiology;  Laterality: N/A;   TONSILLECTOMY          Physical Exam: Blood pressure 120/88, pulse 65, height 5' 10"  (1.778 m), weight 171 lb (77.6 kg), SpO2 96 %.   Affect appropriate Healthy:  appears stated age 76: normal Neck supple with no adenopathy JVP normal no bruits no thyromegaly Lungs clear with no wheezing and good diaphragmatic motion Heart:  S1/S2 no murmur, no rub, gallop or click PMI normal Abdomen: benighn, BS  positve, no tenderness, no AAA no bruit.  No HSM or HJR Distal pulses intact with no bruits No edema Neuro non-focal Skin warm and dry No muscular weakness   Labs:   Lab Results  Component Value Date   WBC 5.1 09/07/2021   HGB 13.9 09/07/2021   HCT 39.5 09/07/2021   MCV 98 (H) 09/07/2021   PLT 177 09/07/2021    No results for input(s): "NA", "K", "CL", "CO2", "BUN", "CREATININE", "CALCIUM", "PROT", "BILITOT", "ALKPHOS", "ALT", "AST", "GLUCOSE" in  the last 168 hours.  Invalid input(s): "LABALBU" Lab Results  Component Value Date   CKTOTAL 51 04/12/2010   CKMB 1.6 04/12/2010   TROPONINI <0.01        NO INDICATION OF MYOCARDIAL INJURY. 04/12/2010    Lab Results  Component Value Date   CHOL 131 02/17/2021   CHOL 119 02/10/2020   CHOL 139 01/26/2020   Lab Results  Component Value Date   HDL 43 02/17/2021   HDL 45 02/10/2020   HDL 54 01/26/2020   Lab Results  Component Value Date   LDLCALC 63 02/17/2021   LDLCALC 53 02/10/2020   LDLCALC 65 01/26/2020   Lab Results  Component Value Date   TRIG 186 (H) 02/17/2021   TRIG 128 02/10/2020   TRIG 112 01/26/2020   Lab Results  Component Value Date   CHOLHDL 3.0 02/17/2021   CHOLHDL 2.6 02/10/2020   CHOLHDL 2.6 01/26/2020   No results found for: "LDLDIRECT"    Radiology: MR ANGIO HEAD WO CONTRAST  Result Date: 10/03/2021 CLINICAL DATA:  Dizzy spells, blurry vision, headaches EXAM: MRI HEAD WITHOUT AND WITH CONTRAST MRA HEAD WITHOUT CONTRAST TECHNIQUE: Multiplanar, multi-echo pulse sequences of the brain and surrounding structures were acquired without and with intravenous contrast. Angiographic images of the Circle of Willis were acquired using MRA technique without intravenous contrast. CONTRAST:  34m GADAVIST GADOBUTROL 1 MMOL/ML IV SOLN COMPARISON:  03/26/2020 MRI/MRA, 12/03/2020 MRA FINDINGS: MRI HEAD FINDINGS Brain: No restricted diffusion to suggest acute or subacute infarct. No acute hemorrhage, mass, mass  effect, or midline shift. No hydrocephalus or extra-axial collection. No abnormal parenchymal or meningeal enhancement. Scattered T2 hyperintense signal in the periventricular white matter, likely the sequela of mild chronic small vessel ischemic disease. Vascular: Please see MRA findings below. Skull and upper cervical spine: Normal marrow signal. Sinuses/Orbits: Mild mucosal thickening in the maxillary sinuses and ethmoid air cells. The orbits are unremarkable. Other: Trace fluid in left mastoid air cells. MRA HEAD FINDINGS Anterior circulation: Both internal carotid arteries are patent to the termini, with unchanged moderate stenosis in the right supraclinoid segment. Unchanged small infundibulum at the origin of the left posterior communicating artery. A1 segments patent, hypoplastic on the right with stenosis at the origin. Normal anterior communicating artery. Anterior cerebral arteries are patent to their distal aspects. No M1 stenosis or occlusion. Normal MCA bifurcations. Unchanged moderate stenosis in the bilateral M2 segments. Distal MCA branches otherwise perfused and symmetric. Posterior circulation: Vertebral arteries patent to the vertebrobasilar junction without stenosis. Posterior inferior cerebral arteries patent bilaterally. Basilar patent to its distal aspect. Superior cerebellar arteries patent bilaterally. Patent P1 segments. Unchanged moderate to severe stenosis in the left P2 segment and mild narrowing in the proximal left P3 segment. PCAs otherwise perfused to their distal aspects without stenosis. The bilateral posterior communicating arteries are not visualized. Anatomic variants: None significant IMPRESSION: 1.  No acute intracranial process. 2. No intracranial large vessel occlusion or new stenosis. Multifocal atherosclerotic narrowing without progression from the prior exam. Electronically Signed   By: AMerilyn BabaM.D.   On: 10/03/2021 01:47   MR Brain W Wo Contrast  Result Date:  10/03/2021 CLINICAL DATA:  Dizzy spells, blurry vision, headaches EXAM: MRI HEAD WITHOUT AND WITH CONTRAST MRA HEAD WITHOUT CONTRAST TECHNIQUE: Multiplanar, multi-echo pulse sequences of the brain and surrounding structures were acquired without and with intravenous contrast. Angiographic images of the Circle of Willis were acquired using MRA technique without intravenous contrast. CONTRAST:  160mGADAVIST GADOBUTROL 1  MMOL/ML IV SOLN COMPARISON:  03/26/2020 MRI/MRA, 12/03/2020 MRA FINDINGS: MRI HEAD FINDINGS Brain: No restricted diffusion to suggest acute or subacute infarct. No acute hemorrhage, mass, mass effect, or midline shift. No hydrocephalus or extra-axial collection. No abnormal parenchymal or meningeal enhancement. Scattered T2 hyperintense signal in the periventricular white matter, likely the sequela of mild chronic small vessel ischemic disease. Vascular: Please see MRA findings below. Skull and upper cervical spine: Normal marrow signal. Sinuses/Orbits: Mild mucosal thickening in the maxillary sinuses and ethmoid air cells. The orbits are unremarkable. Other: Trace fluid in left mastoid air cells. MRA HEAD FINDINGS Anterior circulation: Both internal carotid arteries are patent to the termini, with unchanged moderate stenosis in the right supraclinoid segment. Unchanged small infundibulum at the origin of the left posterior communicating artery. A1 segments patent, hypoplastic on the right with stenosis at the origin. Normal anterior communicating artery. Anterior cerebral arteries are patent to their distal aspects. No M1 stenosis or occlusion. Normal MCA bifurcations. Unchanged moderate stenosis in the bilateral M2 segments. Distal MCA branches otherwise perfused and symmetric. Posterior circulation: Vertebral arteries patent to the vertebrobasilar junction without stenosis. Posterior inferior cerebral arteries patent bilaterally. Basilar patent to its distal aspect. Superior cerebellar arteries  patent bilaterally. Patent P1 segments. Unchanged moderate to severe stenosis in the left P2 segment and mild narrowing in the proximal left P3 segment. PCAs otherwise perfused to their distal aspects without stenosis. The bilateral posterior communicating arteries are not visualized. Anatomic variants: None significant IMPRESSION: 1.  No acute intracranial process. 2. No intracranial large vessel occlusion or new stenosis. Multifocal atherosclerotic narrowing without progression from the prior exam. Electronically Signed   By: Merilyn Baba M.D.   On: 10/03/2021 01:47     EKG: SR rate 61 normal 01/21/19    ASSESSMENT AND PLAN:   1. Dyspnea:  Post COVID with some interstitial lung changes no evidence of cardiac etiology Per primary consider PFTls and f/u with pulmonary  2. Chest Pain: non cardiac cardiac CTA 03/21/19 non obstructive CAD  3. HIV:  Continue anti-retroviral Rx f/u Dr Wendie Simmer ID RX Triumeq 4. Prostate:  On proscar f/u PSA with primary  5. TIA:  In April subsequent IR intervention to right intracranial ICA by Dr Sallyanne Havers 05/14/19 continue DAT with Plavix  6. HTN:  Improved with norvasc   7. HLD   Continue statin LDL 52 02/10/20  8. COVID: positive 05/30/19 and 09/07/20  CXR with faint bilateral patchy lung opacities Rx as outpatient had deltasone BNP and troponin negative Mild elevation in inflammatory markersF/u with primary seems to have some permanent interstitial changes contributing to dyspnea as well as cognitive decline   PFTls Refer to pulmonary   F/U in a year     Signed: Jenkins Rouge 10/20/2021, 8:45 AM

## 2021-10-18 ENCOUNTER — Ambulatory Visit (INDEPENDENT_AMBULATORY_CARE_PROVIDER_SITE_OTHER): Payer: Medicare HMO | Admitting: Neurology

## 2021-10-18 ENCOUNTER — Encounter: Payer: Self-pay | Admitting: Neurology

## 2021-10-18 VITALS — BP 136/83 | HR 82 | Ht 70.0 in | Wt 171.0 lb

## 2021-10-18 DIAGNOSIS — M5481 Occipital neuralgia: Secondary | ICD-10-CM

## 2021-10-18 DIAGNOSIS — M542 Cervicalgia: Secondary | ICD-10-CM

## 2021-10-18 DIAGNOSIS — B20 Human immunodeficiency virus [HIV] disease: Secondary | ICD-10-CM

## 2021-10-18 DIAGNOSIS — G4489 Other headache syndrome: Secondary | ICD-10-CM

## 2021-10-18 DIAGNOSIS — I6521 Occlusion and stenosis of right carotid artery: Secondary | ICD-10-CM | POA: Diagnosis not present

## 2021-10-18 NOTE — Progress Notes (Signed)
[facility]  PATIENT: Jeffrey [name] DOB: 1946-02-01  REFERRING DOCTOR OR PCP: Theotis Burrow, MD; Mina Marble (PCP) SOURCE: Patient, notes from primary care  _________________________________   HISTORICAL  CHIEF COMPLAINT:  Chief Complaint  Patient presents with   Follow-up    Rm 2, alone. Here to f/u for occipital neuralgia. Pt reports doing well up until a month ago. Started to have neck pn. Unable to turn to side. Continues to have blurred vision in the morning.     HISTORY OF PRESENT ILLNESS:  Jeffrey [name] is a 76 - year-old man with HA.  Update 10/18/2021. He reports thesplenius capitus/occipitla nerve injections/blocks helped x 5 months but over the last month the pain has returned.    He also notes reduced ROM in the neck.   Pain is in the upper neck/occiput and radiates to the back of the head towards the vertex.   Pain is worse on the left but is bilateral.      Some mornings he has blurry but not double vision.   This clears up over the next hour.    Repeat MRI/MRA 09/30/2021 showed no occlusion or new stenosis but he has chronic multifocal aterosclerotic narrowing.    No acute findings in brain.     Pain is similar to pain he had 04/2021 ad 12/2019 when we did occipital nerve block/ splenius capitus injeciton.     He had balloon angioplasty supraclinoid ICA 05/14/2019.  An MRA 10/30/2019 showed 60% paraclinoid segment stenosis of the right ICA and moderate stenosis of the M2 segment of the right MCA and left MCA, and moderate stenosis of the left PCA.     He takes Plavix x 1 year   CT scan 12/16/19 showed mild SVID and expanded Tawny Hopping Robbin space next to the basal ganglia on the left   He is HIV positive.  He has stage 3 chronic renal disease.   Creatinine is 1.54.     REVIEW OF SYSTEMS: Constitutional: No fevers, chills, sweats, or change in appetite Eyes: No visual changes, double vision, eye pain Ear, nose and throat: No hearing loss, ear  pain, nasal congestion, sore throat Cardiovascular: No chest pain, palpitations Respiratory:  No shortness of breath at rest or with exertion.   No wheezes GastrointestinaI: No nausea, vomiting, diarrhea, abdominal pain, fecal incontinence Genitourinary:  No dysuria, urinary retention or frequency.  No nocturia. Musculoskeletal:  No neck pain, back pain Integumentary: No rash, pruritus, skin lesions Neurological: as above Psychiatric: No depression at this time.  No anxiety Endocrine: No palpitations, diaphoresis, change in appetite, change in weigh or increased thirst Hematologic/Lymphatic:  No anemia, purpura, petechiae. Allergic/Immunologic: No itchy/runny eyes, nasal congestion, recent allergic reactions, rashes  ALLERGIES: Allergies  Allergen Reactions   Baclofen Other (See Comments)    Back pain. Patient said it made him feel crazy    Efavirenz Rash   Nevirapine Rash and Other (See Comments)    HOME MEDICATIONS:  Current Outpatient Medications:    abacavir-dolutegravir-lamiVUDine (TRIUMEQ) 600-50-300 MG tablet, Take 1 tablet by mouth daily., Disp: 30 tablet, Rfl: 11   Ascorbic Acid (VITAMIN C) 1000 MG tablet, Take 1,000 mg by mouth daily at 6 PM., Disp: , Rfl:    aspirin EC 81 MG tablet, Take 1 tablet (81 mg total) by mouth daily., Disp: , Rfl:    Cholecalciferol (VITAMIN D3) 125 MCG (5000 UT) TABS, Take 5,000 Units by mouth daily at 6 PM., Disp: , Rfl:    clopidogrel (  PLAVIX) 75 MG tablet, Take 1 tablet by mouth once daily, Disp: 30 tablet, Rfl: 0   finasteride (PROSCAR) 5 MG tablet, Take 5 mg by mouth daily., Disp: , Rfl:    losartan (COZAAR) 50 MG tablet, Take 50 mg by mouth daily. Started by his kidney doctor- Dr. Candiss Norse - [facility], Disp: , Rfl:    pantoprazole (PROTONIX) 20 MG tablet, Take 1 tablet (20 mg total) by mouth 2 (two) times daily. Please call to schedule an appointment for further refills. Thanks., Disp: 60 tablet, Rfl: 1   rosuvastatin (CRESTOR) 20 MG  tablet, Take 1 tablet by mouth once daily, Disp: 90 tablet, Rfl: 2   solifenacin (VESICARE) 10 MG tablet, Take by mouth., Disp: , Rfl:    vitamin B-12 (CYANOCOBALAMIN) 500 MCG tablet, Take 500 mcg by mouth daily., Disp: , Rfl:    Zinc 50 MG TABS, Take 50 mg by mouth daily at 6 PM., Disp: , Rfl:   PAST MEDICAL HISTORY: Past Medical History:  Diagnosis Date   Chronic kidney disease    Colon polyps    COVID-19    05/2019   GERD (gastroesophageal reflux disease)    Heart murmur    History of kidney stones    HIV (human immunodeficiency virus infection) ([place]) 1999   Hypertension    Pneumonia    Stroke ([place])    right side weakness   TIA (transient ischemic attack)     PAST SURGICAL HISTORY: Past Surgical History:  Procedure Laterality Date   CHOLECYSTECTOMY  1982   COLONOSCOPY     ESOPHAGEAL MANOMETRY N/A 09/03/2017   Procedure: ESOPHAGEAL MANOMETRY (EM);  Surgeon: Mauri Pole, MD;  Location: [facility];  Service: Endoscopy;  Laterality: N/A;   IR ANGIO INTRA EXTRACRAN SEL COM CAROTID INNOMINATE BILAT MOD SED  04/21/2019   IR ANGIO INTRA EXTRACRAN SEL COM CAROTID INNOMINATE BILAT MOD SED  05/11/2020   IR ANGIO VERTEBRAL SEL VERTEBRAL BILAT MOD SED  04/21/2019   IR ANGIO VERTEBRAL SEL VERTEBRAL BILAT MOD SED  05/11/2020   IR PTA INTRACRANIAL  05/14/2019   IR US GUIDE VASC ACCESS RIGHT  05/11/2020   KNEE SURGERY     LEFT KNEE   RADIOLOGY WITH ANESTHESIA N/A 05/14/2019   Procedure: IR WITH ANESTHESIA STENTING;  Surgeon: Luanne Bras, MD;  Location: [place];  Service: Radiology;  Laterality: N/A;   TONSILLECTOMY      FAMILY HISTORY: Family History  Problem Relation Age of Onset   Hypertension Mother    Heart attack Mother 59   Alzheimer's disease Sister    Heart disease Sister    Alzheimer's disease Sister    Drug abuse Sister    COPD Brother    Kidney cancer Brother    Colon cancer Neg Hx    Esophageal cancer Neg Hx    Pancreatic cancer Neg Hx    Stomach cancer Neg  Hx    Liver disease Neg Hx     SOCIAL HISTORY:  Social History   Socioeconomic History   Marital status: Divorced    Spouse name: Not on file   Number of children: 2   Years of education: 14   Highest education level: Some college, no degree  Occupational History   Occupation: retired    Fish farm manager: [employer]  Tobacco Use   Smoking status: Never   Smokeless tobacco: Former    Types: Chew    Quit date: 2019  Vaping Use   Vaping Use: Never used  Substance and Sexual Activity   Alcohol use: Yes    Comment: rare - few per year   Drug use: No   Sexual activity: Not Currently    Partners: Female    Comment: declined condoms  Other Topics Concern   Not on file  Social History Narrative   Lives alone   Right handed    Caffeine use: tea, mainly water      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, environmental   2 children - 1 boy and 1 girl   Social Determinants of Health   Financial Resource Strain: Low Risk  (12/18/2020)   Overall Financial Resource Strain (CARDIA)    Difficulty of Paying Living Expenses: Not hard at all  Food Insecurity: No Food Insecurity (12/18/2020)   Hunger Vital Sign    Worried About Running Out of Food in the Last Year: Never true    Ran Out of Food in the Last Year: Never true  Transportation Needs: No Transportation Needs (12/18/2020)   PRAPARE - Hydrologist (Medical): No    Lack of Transportation (Non-Medical): No  Physical Activity: Sufficiently Active (12/18/2020)   Exercise Vital Sign    Days of Exercise per Week: 5 days    Minutes of Exercise per Session: 30 min  Stress: No Stress Concern Present (12/18/2020)   [place]    Feeling of Stress : Only a little  Social Connections: Moderately Isolated (12/18/2020)   Social Connection and Isolation Panel [NHANES]    Frequency of Communication with Friends and Family: Twice a week    Frequency of Social Gatherings  with Friends and Family: Once a week    Attends Religious Services: Never    Marine scientist or Organizations: Yes    Attends Archivist Meetings: Never    Marital Status: Divorced  Human resources officer Violence: Not At Risk (12/18/2020)   Humiliation, Afraid, Rape, and Kick questionnaire    Fear of Current or Ex-Partner: No    Emotionally Abused: No    Physically Abused: No    Sexually Abused: No     PHYSICAL EXAM  Vitals:   10/18/21 1439  BP: 136/83  Pulse: 82  Weight: 171 lb (77.6 kg)  Height: 5' 10"  (1.778 m)    Body mass index is 24.54 kg/m.   General: The patient is well-developed and well-nourished and in no acute distress  HEENT:  Head is [place]/AT.     Neck: The neck is tender at the left greater than right occiput.Marland Kitchen  He also has reduced range of motion looking to the left   Neurologic Exam  Mental status: The patient is alert and oriented x 3 at the time of the examination. The patient has apparent normal recent and remote memory, with an apparently normal attention span and concentration ability.   Speech is normal.  Cranial nerves: Extraocular movements are full.   Facial symmetry is present.  Facial strength and sensation was normal.  No obvious hearing deficits are noted.  Motor:  Muscle bulk is normal.   Tone is normal. Strength is  5 / 5 in all 4 extremities.   Sensory: Intact to vibration and touch x4.  Coordination: Cerebellar testing reveals good finger-nose-finger and heel-to-shin bilaterally.  Gait and station: Station is normal.   Gait is normal. Tandem gait is mildly wide. Romberg is negative.   Reflexes: Deep tendon reflexes are symmetric and normal bilaterally.  Marland Kitchen  DIAGNOSTIC DATA (LABS, IMAGING, TESTING) - I reviewed patient records, labs, notes, testing and imaging myself where available.  Lab Results  Component Value Date   WBC 5.1 09/07/2021   HGB 13.9 09/07/2021   HCT 39.5 09/07/2021   MCV 98 (H) 09/07/2021   PLT  177 09/07/2021      Component Value Date/Time   NA 141 09/07/2021 1631   K 4.1 09/07/2021 1631   CL 104 09/07/2021 1631   CO2 21 09/07/2021 1631   GLUCOSE 96 09/07/2021 1631   GLUCOSE 100 (H) 02/17/2021 0954   BUN 24 09/07/2021 1631   CREATININE 1.42 (H) 09/07/2021 1631   CREATININE 1.54 (H) 02/17/2021 0954   CALCIUM 9.3 09/07/2021 1631   PROT 7.1 02/17/2021 0954   PROT 6.4 01/26/2020 0805   ALBUMIN 2.5 (L) 09/06/2020 1150   ALBUMIN 4.3 01/26/2020 0805   AST 13 02/17/2021 0954   ALT 15 02/17/2021 0954   ALKPHOS 63 09/06/2020 1150   BILITOT 0.3 02/17/2021 0954   BILITOT 0.4 01/26/2020 0805   GFRNONAA 57 (L) 09/09/2020 0130   GFRNONAA 39 (L) 10/16/2014 1421   GFRAA >60 12/16/2019 1826   GFRAA 45 (L) 10/16/2014 1421   Lab Results  Component Value Date   CHOL 131 02/17/2021   HDL 43 02/17/2021   LDLCALC 63 02/17/2021   TRIG 186 (H) 02/17/2021   CHOLHDL 3.0 02/17/2021   Lab Results  Component Value Date   HGBA1C 5.5 09/21/2020        ASSESSMENT AND PLAN  Occipital neuralgia of right side  Other headache syndrome  Neck pain  Internal carotid artery stenosis, right  Human immunodeficiency virus (HIV) disease ([place])   1.   Bilateral splenius capitus and bilateral C3C4 paraspinal muscle trigger point injections with 80 mg Depo-Medrol in [place] using sterile technique.     2.    If pain returns within a couple months, consider nortriptyline 25 mg.  Otherwise, we can do trigger point injections with nerve block once or twice a year as needed. 3..   RTC 6 months or as needed.       Desarae Placide A. Felecia Shelling, MD, [facility] 0/34/0352, 4:81 PM Certified in Neurology, Clinical Neurophysiology, Sleep Medicine and Neuroimaging  [facility] [address] [place], [place] [address] [phone]

## 2021-10-20 ENCOUNTER — Encounter: Payer: Self-pay | Admitting: Cardiovascular Disease

## 2021-10-20 ENCOUNTER — Ambulatory Visit (INDEPENDENT_AMBULATORY_CARE_PROVIDER_SITE_OTHER): Payer: Medicare HMO | Admitting: Cardiovascular Disease

## 2021-10-20 VITALS — BP 120/88 | HR 65 | Ht 70.0 in | Wt 171.0 lb

## 2021-10-20 DIAGNOSIS — E785 Hyperlipidemia, unspecified: Secondary | ICD-10-CM | POA: Diagnosis not present

## 2021-10-20 DIAGNOSIS — G459 Transient cerebral ischemic attack, unspecified: Secondary | ICD-10-CM

## 2021-10-20 DIAGNOSIS — J849 Interstitial pulmonary disease, unspecified: Secondary | ICD-10-CM | POA: Diagnosis not present

## 2021-10-20 DIAGNOSIS — I2510 Atherosclerotic heart disease of native coronary artery without angina pectoris: Secondary | ICD-10-CM

## 2021-10-20 NOTE — Patient Instructions (Signed)
Medication Instructions:  Your physician recommends that you continue on your current medications as directed. Please refer to the Current Medication list given to you today.  *If you need a refill on your cardiac medications before your next appointment, please call your pharmacy*  Lab Work: If you have labs (blood work) drawn today and your tests are completely normal, you will receive your results only by: [place] (if you have MyChart) OR A paper copy in the mail If you have any lab test that is abnormal or we need to change your treatment, we will call you to review the results.  Testing/Procedure Your physician has recommended that you have a pulmonary function test. Pulmonary Function Tests are a group of tests that measure how well air moves in and out of your lungs.  Follow-Up: At [facility], you and your health needs are our priority.  As part of our continuing mission to provide you with exceptional heart care, we have created designated Provider Care Teams.  These Care Teams include your primary Cardiologist (physician) and Advanced Practice Providers (APPs -  Physician Assistants and Nurse Practitioners) who all work together to provide you with the care you need, when you need it.  We recommend signing up for the patient portal called "MyChart".  Sign up information is provided on this After Visit Summary.  MyChart is used to connect with patients for Virtual Visits (Telemedicine).  Patients are able to view lab/test results, encounter notes, upcoming appointments, etc.  Non-urgent messages can be sent to your provider as well.   To learn more about what you can do with MyChart, go to [email or web address].    Your next appointment:   1 year(s)  The format for your next appointment:   In Person  Provider:   Jenkins Rouge, MD {  You have been referred to Pulmonology.    Important Information About Sugar

## 2021-11-06 ENCOUNTER — Other Ambulatory Visit: Payer: Self-pay | Admitting: Cardiovascular Disease

## 2021-11-14 DIAGNOSIS — R351 Nocturia: Secondary | ICD-10-CM | POA: Diagnosis not present

## 2021-11-14 DIAGNOSIS — N5201 Erectile dysfunction due to arterial insufficiency: Secondary | ICD-10-CM | POA: Diagnosis not present

## 2021-11-14 DIAGNOSIS — N401 Enlarged prostate with lower urinary tract symptoms: Secondary | ICD-10-CM | POA: Diagnosis not present

## 2021-11-22 ENCOUNTER — Telehealth: Payer: Self-pay | Admitting: *Deleted

## 2021-11-22 ENCOUNTER — Ambulatory Visit (INDEPENDENT_AMBULATORY_CARE_PROVIDER_SITE_OTHER): Payer: Medicare HMO | Admitting: Nurse Practitioner

## 2021-11-22 ENCOUNTER — Encounter: Payer: Self-pay | Admitting: Nurse Practitioner

## 2021-11-22 VITALS — BP 120/80 | HR 73 | Ht 70.0 in | Wt 166.0 lb

## 2021-11-22 DIAGNOSIS — R1310 Dysphagia, unspecified: Secondary | ICD-10-CM

## 2021-11-22 DIAGNOSIS — K219 Gastro-esophageal reflux disease without esophagitis: Secondary | ICD-10-CM | POA: Diagnosis not present

## 2021-11-22 MED ORDER — PANTOPRAZOLE SODIUM 20 MG PO TBEC: 20.0000 mg | DELAYED_RELEASE_TABLET | Freq: Two times a day (BID) | ORAL | 3 refills | Status: DC

## 2021-11-22 NOTE — Telephone Encounter (Signed)
Per Dr Johnsie Cancel note  "TIA:  In April subsequent IR intervention to right intracranial ICA by Dr Sallyanne Havers 05/14/19 continue DAT with Plavix "  Please review Plavix clearance from PCP or neurology.

## 2021-11-22 NOTE — Progress Notes (Signed)
Chief Complaint:  medication refill, worsening swallowing problems   Assessment &  Plan   Chronic GERD without Barrett's esophagus. Maintained on pantoprazole 20 mg twice daily (last year he was able to decrease to once daily but ultimately had to go back to twice daily dosing due to recurrent symptoms) Discussed antireflux measures and he seems to be adherent to those Refill pantoprazole 20 mg twice daily.  Will give 90-day supply with 4 refills  Chronic Dysphagia, worsening.  Foods such as chicken, beef and bread are particularly problematic. He had prior evaluation with barium study, endoscopy with dilation and esophageal manometry. There has been no obvious evidence of dysmotility, no overt stricturing. Until we can better understand what is going on with your swallowing be sure to take swallowing precautions. Eat slowly, chew food well before swallowing. Drink  liquids in between each bite to avoid food impaction. He recalls improvement of dysphagia following empiric esophageal dilation in 2018 and is interested in proceeding with another dilation. Schedule for EGD. The risks and benefits of EGD with possible biopsies were discussed with the patient who agrees to proceed.    Carotid artery disease status post intervention to right ICA in 2021. He has a history of TIAs / CVA.  He is maintained on Plavix Hold Plavix for 5 days before EGD - will instruct when and how to resume after procedure. Patient understands that there is a low but real risk of cardiovascular event such as heart attack, stroke, or embolism /  thrombosis, or ischemia while off Plavix. The patient consents to proceed. Will communicate by phone or EMR with patient's prescribing provider to confirm that holding Plavix is reasonable in this case.   History of adenomatous colon polyps.  A 5 mm ascending colon tubular adenoma was removed in 2017.  A 5 mm cecal polyp was removed but not retrieved.  7-year interval colonoscopy  planned for March 2020   HPI   Jeffrey [name] is a 76 year old male with history of chronic GERD, adenomatous colon polyps, CKD, carotid artery disease, TIAs on plavix, CAD, diastolic heart failure, HIV, hyperlipidemia, gynecomastia  [name] follows with Dr. Johnsie Cancel for history of CAD, CHF.  He was last seen by cardiology in mid June, was complaining of dyspnea time.  This was felt not to be cardiac related.  He has an upcoming appointment with Pulmonary.   Jeffrey [name] was last seen in our office in February 2022 for follow-up on GERD.  He was having mild dysphagia at that time.  His dysphagia is chronic. He has had prior evaluation with barium study, endoscopy with dilation and esophageal manometry. There has been no obvious evidence of dysmotility, no overt stricturing.  Interval History:  Jeffrey [name] is here for refill on pantoprazole.  As suggested at time of last visit he did decrease pantoprazole to 20 mg daily.  This worked for a while but at some point (he cannot remember when) he had to resume twice daily dosing due to recurrent symptoms including heartburn and regurgitation.  For the last few months he does feel like his GERD symptoms have been back under fairly good control.  He goes to bed on an empty stomach, avoids trigger foods.  He consumes very little caffeine.  He does have chronic dysphagia and says that this has gotten much worse lately.  He is particularly having problems swallowing chicken, beef and bread.  No odynophagia  Previous GI Evaluation   Esophageal manometry 09/03/2017 - low resting LES pressure with appropriate  relaxation, no evidence of dysmotility Barium swallow 11/21/2017 - small HH, evidence of reflux, no obvious stricture, normal motility EGD 10/16/2016 - normal esophagus, empiric dilation to 30m, biopsies to rule out EoE, gastritis noted - H pylori negative, no EoE Colonoscopy 08/04/2015 - 2 small polyps, internal hemorrhoids - adenomas - due for repeat 07/2022  Labs:     Latest Ref  Rng & Units 09/07/2021    4:31 PM 02/17/2021    9:54 AM 09/13/2020    5:18 PM  CBC  WBC 3.4 - 10.8 x10E3/uL 5.1  10.7  6.3   Hemoglobin 13.0 - 17.7 g/dL 13.9  14.8  12.2   Hematocrit 37.5 - 51.0 % 39.5  43.1  36.5   Platelets 150 - 450 x10E3/uL 177  189  368        Latest Ref Rng & Units 02/17/2021    9:54 AM 09/06/2020   11:50 AM 02/10/2020    9:18 AM  Hepatic Function  Total Protein 6.1 - 8.1 g/dL 7.1  6.4  6.0   Albumin 3.5 - 5.0 g/dL  2.5    AST 10 - 35 U/L 13  25  15    ALT 9 - 46 U/L 15  25  12    Alk Phosphatase 38 - 126 U/L  63    Total Bilirubin 0.2 - 1.2 mg/dL 0.3  0.9  0.3      Past Medical History:  Diagnosis Date   Chronic kidney disease    Colon polyps    COVID-19    05/2019   GERD (gastroesophageal reflux disease)    Heart murmur    History of kidney stones    [place] 1999   Hypertension    Pneumonia    Stroke ([place]    right side weakness   TIA (transient ischemic attack)     Past Surgical History:  Procedure Laterality Date   CHOLECYSTECTOMY  1982   COLONOSCOPY     ESOPHAGEAL MANOMETRY N/A 09/03/2017   Procedure: ESOPHAGEAL MANOMETRY (EM);  Surgeon: NMauri Pole MD;  Location[facility];  Service: Endoscopy;  Laterality: N/A;   IR ANGIO INTRA EXTRACRAN SEL COM CAROTID INNOMINATE BILAT MOD SED  04/21/2019   IR ANGIO INTRA EXTRACRAN SEL COM CAROTID INNOMINATE BILAT MOD SED  05/11/2020   IR ANGIO VERTEBRAL SEL VERTEBRAL BILAT MOD SED  04/21/2019   IR ANGIO VERTEBRAL SEL VERTEBRAL BILAT MOD SED  05/11/2020   IR PTA INTRACRANIAL  05/14/2019   IR [place] VASC ACCESS RIGHT  05/11/2020   KNEE SURGERY     LEFT KNEE   RADIOLOGY WITH ANESTHESIA N/A 05/14/2019   Procedure: IR WITH ANESTHESIA STENTING;  Surgeon: DLuanne Bras MD;  [facility]  Service: Radiology;  Laterality: N/A;   TONSILLECTOMY      Current Medications, Allergies, Family History and Social History were reviewed in [facility].     Current Outpatient Medications  Medication Sig Dispense Refill   abacavir-dolutegravir-lamiVUDine (TRIUMEQ) 600-50-300 MG tablet Take 1 tablet by mouth daily. 30 tablet 11   Ascorbic Acid (VITAMIN C) 1000 MG tablet Take 1,000 mg by mouth daily at 6 PM.     aspirin EC 81 MG tablet Take 1 tablet (81 mg total) by mouth daily.     Cholecalciferol (VITAMIN D3) 125 MCG (5000 UT) TABS Take 5,000 Units by mouth daily at 6 PM.     clopidogrel (PLAVIX) 75 MG tablet Take 1 tablet (75  mg total) by mouth daily. 90 tablet 3   finasteride (PROSCAR) 5 MG tablet Take 5 mg by mouth daily.     losartan (COZAAR) 50 MG tablet Take 50 mg by mouth daily. Started by his kidney doctor- Dr. Candiss Norse - [facility]     pantoprazole (PROTONIX) 20 MG tablet Take 1 tablet (20 mg total) by mouth 2 (two) times daily. Please call to schedule an appointment for further refills. Thanks. 60 tablet 1   rosuvastatin (CRESTOR) 20 MG tablet Take 1 tablet by mouth once daily 90 tablet 2   solifenacin (VESICARE) 10 MG tablet Take by mouth.     vitamin B-12 (CYANOCOBALAMIN) 500 MCG tablet Take 500 mcg by mouth daily.     Zinc 50 MG TABS Take 50 mg by mouth daily at 6 PM.     No current facility-administered medications for this visit.    Review of Systems: No chest pain. No shortness of breath. No urinary complaints.    Physical Exam  Wt Readings from Last 3 Encounters:  10/20/21 171 lb (77.6 kg)  10/18/21 171 lb (77.6 kg)  09/07/21 172 lb 6.4 oz (78.2 kg)    BP 120/80   Pulse 73   Ht 5' 10"  (1.778 m)   Wt 166 lb (75.3 kg)   BMI 23.82 kg/m  Constitutional:  Generally well appearing male in no acute distress. Psychiatric: Pleasant. Normal mood and affect. Behavior is normal. EENT: Pupils normal.  Conjunctivae are normal. No scleral icterus. Neck supple.  Cardiovascular: Normal rate, regular rhythm. No edema Pulmonary/chest: Effort normal and breath sounds normal. No wheezing, rales or  rhonchi. Abdominal: Soft, nondistended, nontender. Bowel sounds active throughout. There are no masses palpable. No hepatomegaly. Neurological: Alert and oriented to person place and time. Skin: Skin is warm and dry. No rashes noted.  [name], NP  11/22/2021, 8:19 AM

## 2021-11-22 NOTE — Telephone Encounter (Signed)
 [facility] Pre-operative Risk Assessment     Request for surgical clearance:     Endoscopy Procedure  What type of surgery is being performed?     EGD  When is this surgery scheduled?     Monday 12/05/21  What type of clearance is required ?   Pharmacy  Are there any medications that need to be held prior to surgery and how long? Plavix 5 days  Practice name and name of physician performing surgery?      [facility]  What is your office phone and fax number?      Phone- [phone]  [phone]  Anesthesia type (None, local, MAC, general) ?       MAC

## 2021-11-22 NOTE — Patient Instructions (Signed)
We have sent the following medications to your pharmacy for you to pick up at your convenience: Pantoprazole 20 mg twice daily  30-60 minutes before breakfast and dinner.   You have been scheduled for an endoscopy. Please follow written instructions given to you at your visit today. If you use inhalers (even only as needed), please bring them with you on the day of your procedure.  If you are age 74 or older, your body mass index should be between 23-30. Your Body mass index is 23.82 kg/m. If this is out of the aforementioned range listed, please consider follow up with your Primary Care Provider.  If you are age 5 or younger, your body mass index should be between 19-25. Your Body mass index is 23.82 kg/m. If this is out of the aformentioned range listed, please consider follow up with your Primary Care Provider.   ________________________________________________________  The [facility] providers would like to encourage you to use [facility] to communicate with providers for non-urgent requests or questions.  Due to long hold times on the telephone, sending your provider a message by [facility] may be a faster and more efficient way to get a response.  Please allow 48 business hours for a response.  Please remember that this is for non-urgent requests.  _______________________________________________________

## 2021-11-23 NOTE — Telephone Encounter (Signed)
Please advise on Plavix hold for 5 days for upcoming procedure.    Thank you,  Caryl Asp, [place]

## 2021-11-23 NOTE — Progress Notes (Signed)
Agree with assessment and plan as outlined.  

## 2021-11-24 NOTE — Telephone Encounter (Signed)
Patient informed of Plavix hold and voiced understanding.

## 2021-11-28 ENCOUNTER — Other Ambulatory Visit: Payer: Self-pay | Admitting: Cardiovascular Disease

## 2021-11-28 DIAGNOSIS — E785 Hyperlipidemia, unspecified: Secondary | ICD-10-CM

## 2021-12-05 ENCOUNTER — Ambulatory Visit (AMBULATORY_SURGERY_CENTER): Payer: Medicare HMO | Admitting: Gastroenterology

## 2021-12-05 ENCOUNTER — Encounter: Payer: Self-pay | Admitting: Gastroenterology

## 2021-12-05 VITALS — BP 99/67 | HR 65 | Temp 97.3°F | Resp 16 | Ht 70.0 in | Wt 166.0 lb

## 2021-12-05 DIAGNOSIS — K449 Diaphragmatic hernia without obstruction or gangrene: Secondary | ICD-10-CM

## 2021-12-05 DIAGNOSIS — K219 Gastro-esophageal reflux disease without esophagitis: Secondary | ICD-10-CM | POA: Diagnosis not present

## 2021-12-05 DIAGNOSIS — R1310 Dysphagia, unspecified: Secondary | ICD-10-CM | POA: Diagnosis not present

## 2021-12-05 MED ORDER — SODIUM CHLORIDE 0.9 % IV SOLN: 500.0000 mL | Freq: Once | INTRAVENOUS | Status: DC

## 2021-12-05 NOTE — Progress Notes (Signed)
Sedate, gd SR, tolerated procedure well, VSS, report to RN 

## 2021-12-05 NOTE — Op Note (Signed)
[place] Patient Name: Jeffrey [name] Procedure Date: 12/05/2021 10:19 AM MRN: [id number] Endoscopist: Remo Lipps P. Havery Moros , MD Age: 76 Referring MD:  Date of Birth: 10-28-1945 Gender: Male Account #: [id number] Procedure:                Upper GI endoscopy Indications:              Dysphagia, history of GERD on protonix. Recurrent                            dysphagia, extensive evaluation negative in 2018                            with empiric dilation at that time Medicines:                Monitored Anesthesia Care Procedure:                Pre-Anesthesia Assessment:                           - Prior to the procedure, a History and Physical                            was performed, and patient medications and                            allergies were reviewed. The patient's tolerance of                            previous anesthesia was also reviewed. The risks                            and benefits of the procedure and the sedation                            options and risks were discussed with the patient.                            All questions were answered, and informed consent                            was obtained. Prior Anticoagulants: The patient has                            taken Plavix (clopidogrel), last dose was 5 days                            prior to procedure. ASA Grade Assessment: III - A                            patient with severe systemic disease. After                            reviewing the risks and benefits, the patient was  deemed in satisfactory condition to undergo the                            procedure.                           After obtaining informed consent, the endoscope was                            passed under direct vision. Throughout the                            procedure, the patient's blood pressure, pulse, and                            oxygen saturations were monitored continuously. The                             Endoscope was introduced through the mouth, and                            advanced to the second part of duodenum. The upper                            GI endoscopy was accomplished without difficulty.                            The patient tolerated the procedure well. Scope In: Scope Out: Findings:                 Esophagogastric landmarks were identified: the                            Z-line was found at 38 cm, the gastroesophageal                            junction was found at 38 cm and the upper extent of                            the gastric folds was found at 39 cm from the                            incisors. 1cm hiatal hernia.                           The Z-line was slightly irregular but did not meet                            criteria for Barrett's.                           The exam of the esophagus was otherwise normal. No                            focal  stenosis / stricture appreciated.                           A guidewire was placed and the scope was withdrawn.                            Empiric dilation was performed in the entire                            esophagus with a Savary dilator with mild                            resistance at 17 mm and 18 mm. Relook endoscopy                            showed no mucosal wrents.                           The entire examined stomach was normal.                           The examined duodenum was normal. Complications:            No immediate complications. Estimated blood loss:                            None. Estimated Blood Loss:     Estimated blood loss: none. Impression:               - Esophagogastric landmarks identified.                           - 1 cm hiatal hernia                           - Z-line slightly irregular but did not meet                            criteria for Barrett's.                           - Normal esophagus otherwise - empiric dilation                             performed to 55m                           - Normal stomach.                           - Normal examined duodenum. Recommendation:           - Patient has a contact number available for                            emergencies. The signs and symptoms of potential  delayed complications were discussed with the                            patient. Return to normal activities tomorrow.                            Written discharge instructions were provided to the                            patient.                           - Resume previous diet.                           - Continue present medications including Plavix                            today.                           - Follow up if symptoms persist despite dilation Remo Lipps P. Faraz Ponciano, MD 12/05/2021 10:39:04 AM This report has been signed electronically.

## 2021-12-05 NOTE — Patient Instructions (Signed)
Handouts on hernia. Await pathology of biopsies taken today. Resume previous diet and continue present medications - including Plavix today  Follow up if symptoms persist despite dilation    YOU HAD AN ENDOSCOPIC PROCEDURE TODAY AT [facility]:   Refer to the procedure report that was given to you for any specific questions about what was found during the examination.  If the procedure report does not answer your questions, please call your gastroenterologist to clarify.  If you requested that your care partner not be given the details of your procedure findings, then the procedure report has been included in a sealed envelope for you to review at your convenience later.  YOU SHOULD EXPECT: Some feelings of bloating in the abdomen. Passage of more gas than usual.  Walking can help get rid of the air that was put into your GI tract during the procedure and reduce the bloating. If you had a lower endoscopy (such as a colonoscopy or flexible sigmoidoscopy) you may notice spotting of blood in your stool or on the toilet paper. If you underwent a bowel prep for your procedure, you may not have a normal bowel movement for a few days.  Please Note:  You might notice some irritation and congestion in your nose or some drainage.  This is from the oxygen used during your procedure.  There is no need for concern and it should clear up in a day or so.  SYMPTOMS TO REPORT IMMEDIATELY:  Following upper endoscopy (EGD)  Vomiting of blood or coffee ground material  New chest pain or pain under the shoulder blades  Painful or persistently difficult swallowing  New shortness of breath  Fever of 100F or higher  Black, tarry-looking stools  For urgent or emergent issues, a gastroenterologist can be reached at any hour by calling [phone]. Do not use MyChart messaging for urgent concerns.    DIET:  We do recommend a small meal at first, but then you may proceed to your regular diet.   Drink plenty of fluids but you should avoid alcoholic beverages for 24 hours.  ACTIVITY:  You should plan to take it easy for the rest of today and you should NOT DRIVE or use heavy machinery until tomorrow (because of the sedation medicines used during the test).    FOLLOW UP: Our staff will call the number listed on your records the next business day following your procedure.  We will call around 7:15- 8:00 am to check on you and address any questions or concerns that you may have regarding the information given to you following your procedure. If we do not reach you, we will leave a message.  If you develop any symptoms (ie: fever, flu-like symptoms, shortness of breath, cough etc.) before then, please call [phone].  If you test positive for Covid 19 in the 2 weeks post procedure, please call and report this information to [place].    If any biopsies were taken you will be contacted by phone or by letter within the next 1-3 weeks.  Please call us at [phone] if you have not heard about the biopsies in 3 weeks.    SIGNATURES/CONFIDENTIALITY: You and/or your care partner have signed paperwork which will be entered into your electronic medical record.  These signatures attest to the fact that that the information above on your After Visit Summary has been reviewed and is understood.  Full responsibility of the confidentiality of this discharge information lies with you and/or  your care-partner.

## 2021-12-05 NOTE — Progress Notes (Signed)
History and Physical Interval Note: Here for EGD, see last note 11/22/21 - history of GERD and dysphagia, on protonix 68m / day. History of longstanding dysphagia with extensive evaluation - last examined in 2018 and had dilation empirically, he thinks it helped him. He is here for repeat EGD today. Off Plavix 5 days. No cardiopulmonary symptoms. He wishes to proceed.  12/05/2021 10:21 AM  CSula Rumple has presented today for endoscopic procedure(s), with the diagnosis of  Encounter Diagnoses  Name Primary?   Gastroesophageal reflux disease without esophagitis Yes   Dysphagia, unspecified type   .  The various methods of evaluation and treatment have been discussed with the patient and/or family. After consideration of risks, benefits and other options for treatment, the patient has consented to  the endoscopic procedure(s).   The patient's history has been reviewed, patient examined, no change in status, stable for surgery.  I have reviewed the patient's chart and labs.  Questions were answered to the patient's satisfaction.    SJolly Mango MD [facility]

## 2021-12-05 NOTE — Progress Notes (Signed)
Called to room to assist during endoscopic procedure.  Patient ID and intended procedure confirmed with present staff. Received instructions for my participation in the procedure from the performing physician.  

## 2021-12-06 ENCOUNTER — Telehealth: Payer: Self-pay

## 2021-12-06 NOTE — Telephone Encounter (Signed)
  Follow up Call-     12/05/2021    9:28 AM  Call back number  Post procedure Call Back phone  # [phone]  Permission to leave phone message Yes     Patient questions:  Do you have a fever, pain , or abdominal swelling? No. Pain Score  0 *  Have you tolerated food without any problems? Yes.    Have you been able to return to your normal activities? Yes.    Do you have any questions about your discharge instructions: Diet   No. Medications  No. Follow up visit  No.  Do you have questions or concerns about your Care? No.  Actions: * If pain score is 4 or above: No action needed, pain <4.

## 2021-12-20 ENCOUNTER — Ambulatory Visit: Payer: Self-pay

## 2021-12-20 NOTE — Patient Outreach (Addendum)
  Care Coordination   Initial Visit Note   12/20/2021 Name: Jeffrey [name] MRN: [id number] DOB: 1945-12-17  Jeffrey [name] is a 76 y.o. year old male who sees Gerrit Heck, MD for primary care. I spoke with  Jeffrey [name] by phone today  What matters to the patients health and wellness today?  My breathing, I have scarring on my lungs. Patient seeing pulmonologist next week.    Goals Addressed   None     SDOH assessments and interventions completed:  Yes     Care Coordination Interventions Activated:  Yes  Care Coordination Interventions:  Yes, provided-notified [facility] patient needs AWV.   Follow up plan: No further intervention required.   Encounter Outcome:  Pt. Visit Completed

## 2022-01-03 ENCOUNTER — Ambulatory Visit (INDEPENDENT_AMBULATORY_CARE_PROVIDER_SITE_OTHER): Payer: Medicare HMO | Admitting: Emergency Medicine

## 2022-01-03 ENCOUNTER — Encounter: Payer: Self-pay | Admitting: Emergency Medicine

## 2022-01-03 DIAGNOSIS — I2510 Atherosclerotic heart disease of native coronary artery without angina pectoris: Secondary | ICD-10-CM

## 2022-01-03 DIAGNOSIS — R0609 Other forms of dyspnea: Secondary | ICD-10-CM

## 2022-01-03 DIAGNOSIS — G459 Transient cerebral ischemic attack, unspecified: Secondary | ICD-10-CM

## 2022-01-03 DIAGNOSIS — J849 Interstitial pulmonary disease, unspecified: Secondary | ICD-10-CM

## 2022-01-03 DIAGNOSIS — E785 Hyperlipidemia, unspecified: Secondary | ICD-10-CM

## 2022-01-03 LAB — PULMONARY FUNCTION TEST
DL/VA % pred: 68 %
DL/VA: 2.75 ml/min/mmHg/L
DLCO cor % pred: 65 %
DLCO cor: 15.1 ml/min/mmHg
DLCO unc % pred: 65 %
DLCO unc: 15.1 ml/min/mmHg
FEF 25-75 Post: 2.11 L/sec
FEF 25-75 Pre: 2.63 L/sec
FEF2575-%Change-Post: -19 %
FEF2575-%Pred-Post: 105 %
FEF2575-%Pred-Pre: 131 %
FEV1-%Change-Post: -16 %
FEV1-%Pred-Post: 92 %
FEV1-%Pred-Pre: 111 %
FEV1-Post: 2.54 L
FEV1-Pre: 3.05 L
FEV1FVC-%Change-Post: -18 %
FEV1FVC-%Pred-Pre: 102 %
FEV6-%Change-Post: 1 %
FEV6-%Pred-Post: 117 %
FEV6-%Pred-Pre: 114 %
FEV6-Post: 4.13 L
FEV6-Pre: 4.06 L
FEV6FVC-%Change-Post: 0 %
FEV6FVC-%Pred-Post: 106 %
FEV6FVC-%Pred-Pre: 106 %
FVC-%Change-Post: 2 %
FVC-%Pred-Post: 110 %
FVC-%Pred-Pre: 108 %
FVC-Post: 4.17 L
FVC-Pre: 4.09 L
Post FEV1/FVC ratio: 61 %
Post FEV6/FVC ratio: 99 %
Pre FEV1/FVC ratio: 75 %
Pre FEV6/FVC Ratio: 99 %
RV % pred: 89 %
RV: 2.12 L
TLC % pred: 95 %
TLC: 6.14 L

## 2022-01-03 MED ORDER — ALBUTEROL SULFATE HFA 108 (90 BASE) MCG/ACT IN AERS: 2.0000 | INHALATION_SPRAY | Freq: Four times a day (QID) | RESPIRATORY_TRACT | 6 refills | Status: DC | PRN

## 2022-01-03 NOTE — Assessment & Plan Note (Signed)
Reviewed his pulmonary function testing today.  There is evidence for some mild obstruction, consider background fixed asthma.  His flow-volume loop has truncation of his expiratory airflows that could be consistent with a variable intrathoracic obstruction.  He is not a frequent cough or, no reason to suspect tracheobronchomalacia.  I do not see any evidence of an endobronchial lesion on his CT chest from 07/2020.  We may decide to perform an airway inspection going forward to better evaluate.  I think he needs a repeat CT chest to evaluate for any evidence of evolving interstitial disease due to his multiple bouts of COVID-19.  We will perform a walking oximetry today.  I will also ask him to try using albuterol to see if he gets benefit given the mild airflow obstruction.

## 2022-01-03 NOTE — Patient Instructions (Signed)
We will perform a repeat CT scan of the chest to compare with 07/2020 We will perform a walking oximetry today Try using albuterol 2 puffs up to every 4 hours if you need it for shortness of breath.  You can try treating about 10 minutes before exertion to see if you get benefit.  Keep track of whether medication helps you so we can review. Follow with Dr Lamonte Sakai in 1 month or next available after your CT scan

## 2022-01-03 NOTE — Patient Instructions (Signed)
Full PFT Performed Today  

## 2022-01-03 NOTE — Progress Notes (Signed)
Subjective:    Patient ID: Jeffrey [name], male    DOB: 02-Sep-1945, 76 y.o.   MRN: [id number]  HPI 76 year old never smoker with a history of HIV, CKD, hypertension with diastolic dysfunction, cerebrovascular disease.  He had COVID-19 in 2021 and has had some associated cognitive decline, progressive dyspnea since that time.  He was hospitalized in May 2022 with colitis, [place] and treated again for COVID at that time.  He is referred today for evaluation of his progressive dyspnea.  He had pulmonary function testing as below.  Not currently on bronchodilator therapy. He describes exertional SOB, after walking about 100 ft. He has a chronic cough, non-productive. No real wheeze, maybe hears some UA noise at night. Has some controled GERD.   Pulmonary function testing performed today and reviewed by me shows evidence for mild obstruction, normal lung volumes, decreased diffusion capacity that does not fully correct to the normal range when adjusted for his alveolar volume.  His flow-volume loop has a very truncated expiratory component suggestive of possible variable intrathoracic obstruction  CT chest 08/04/2020 reviewed by me shows no mediastinal or hilar adenopathy, mild bilateral cylindrical bronchiectasis with some peripheral predominant interstitial reticulation principally at the bases   Review of Systems As per HPI  Past Medical History:  Diagnosis Date   Chronic kidney disease    Colon polyps    COVID-19    05/2019   GERD (gastroesophageal reflux disease)    Heart murmur    History of kidney stones    HIV (human immunodeficiency virus infection) ([place]) 1999   Hypertension    Pneumonia    Stroke ([place])    right side weakness   TIA (transient ischemic attack)      Family History  Problem Relation Age of Onset   Hypertension Mother    Heart attack Mother 70   Alzheimer's disease Sister    Heart disease Sister    Alzheimer's disease Sister    Drug abuse Sister    COPD  Brother    Kidney cancer Brother    Colon cancer Neg Hx    Esophageal cancer Neg Hx    Pancreatic cancer Neg Hx    Stomach cancer Neg Hx    Liver disease Neg Hx      Social History   Socioeconomic History   Marital status: Divorced    Spouse name: Not on file   Number of children: 2   Years of education: 14   Highest education level: Some college, no degree  Occupational History   Occupation: retired    Fish farm manager: [employer]  Tobacco Use   Smoking status: Never   Smokeless tobacco: Former    Types: Chew    Quit date: 2019  Vaping Use   Vaping Use: Never used  Substance and Sexual Activity   Alcohol use: Yes    Comment: rare - few per year   Drug use: No   Sexual activity: Not Currently    Partners: Female    Comment: declined condoms  Other Topics Concern   Not on file  Social History Narrative   Lives alone   Right handed    Caffeine use: tea, mainly water      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, environmental   2 children - 1 boy and 1 girl   Social Determinants of Health   Financial Resource Strain: Low Risk  (12/18/2020)   Overall Financial Resource Strain (CARDIA)    Difficulty of Paying  Living Expenses: Not hard at all  Food Insecurity: No Food Insecurity (12/18/2020)   Hunger Vital Sign    Worried About Running Out of Food in the Last Year: Never true    Ran Out of Food in the Last Year: Never true  Transportation Needs: No Transportation Needs (12/18/2020)   PRAPARE - Hydrologist (Medical): No    Lack of Transportation (Non-Medical): No  Physical Activity: Sufficiently Active (12/18/2020)   Exercise Vital Sign    Days of Exercise per Week: 5 days    Minutes of Exercise per Session: 30 min  Stress: No Stress Concern Present (12/18/2020)   [place]    Feeling of Stress : Only a little  Social Connections: Moderately Isolated (12/18/2020)   Social Connection and Isolation  Panel [NHANES]    Frequency of Communication with Friends and Family: Twice a week    Frequency of Social Gatherings with Friends and Family: Once a week    Attends Religious Services: Never    Marine scientist or Organizations: Yes    Attends Archivist Meetings: Never    Marital Status: Divorced  Human resources officer Violence: Not At Risk (12/18/2020)   Humiliation, Afraid, Rape, and Kick questionnaire    Fear of Current or Ex-Partner: No    Emotionally Abused: No    Physically Abused: No    Sexually Abused: No     Allergies  Allergen Reactions   Baclofen Other (See Comments)    Back pain. Patient said it made him feel crazy    Efavirenz Rash   Nevirapine Rash and Other (See Comments)     Outpatient Medications Prior to Visit  Medication Sig Dispense Refill   abacavir-dolutegravir-lamiVUDine (TRIUMEQ) 600-50-300 MG tablet Take 1 tablet by mouth daily. 30 tablet 11   Ascorbic Acid (VITAMIN C) 1000 MG tablet Take 1,000 mg by mouth daily at 6 PM.     aspirin EC 81 MG tablet Take 1 tablet (81 mg total) by mouth daily.     Cholecalciferol (VITAMIN D3) 125 MCG (5000 UT) TABS Take 5,000 Units by mouth daily at 6 PM.     clopidogrel (PLAVIX) 75 MG tablet Take 1 tablet (75 mg total) by mouth daily. 90 tablet 3   finasteride (PROSCAR) 5 MG tablet Take 5 mg by mouth daily.     losartan (COZAAR) 50 MG tablet Take 50 mg by mouth daily. Started by his kidney doctor- Dr. Candiss Norse - [facility]     pantoprazole (PROTONIX) 20 MG tablet Take 1 tablet (20 mg total) by mouth 2 (two) times daily. 180 tablet 3   rosuvastatin (CRESTOR) 20 MG tablet Take 1 tablet by mouth once daily 90 tablet 3   solifenacin (VESICARE) 10 MG tablet Take by mouth.     vitamin B-12 (CYANOCOBALAMIN) 500 MCG tablet Take 500 mcg by mouth daily.     Zinc 50 MG TABS Take 50 mg by mouth daily at 6 PM.     No facility-administered medications prior to visit.         Objective:   Physical Exam  Vitals:    01/03/22 1601  BP: 126/74  Pulse: 96  Temp: 98.7 F (37.1 C)  TempSrc: Oral  SpO2: 95%  Weight: 166 lb 12.8 oz (75.7 kg)  Height: 5' 7"  (1.702 m)   Gen: Pleasant, well-nourished, in no distress,  normal affect  ENT: No lesions,  mouth clear,  oropharynx clear, no postnasal drip  Neck: No JVD, no stridor  Lungs: No use of accessory muscles, no crackles or wheezing on normal respiration, no wheeze on forced expiration  Cardiovascular: RRR, heart sounds normal, no murmur or gallops, no peripheral edema  Musculoskeletal: No deformities, no cyanosis or clubbing  Neuro: alert, awake, non focal  Skin: Warm, no lesions or rash     Assessment & Plan:  Dyspnea on exertion Reviewed his pulmonary function testing today.  There is evidence for some mild obstruction, consider background fixed asthma.  His flow-volume loop has truncation of his expiratory airflows that could be consistent with a variable intrathoracic obstruction.  He is not a frequent cough or, no reason to suspect tracheobronchomalacia.  I do not see any evidence of an endobronchial lesion on his CT chest from 07/2020.  We may decide to perform an airway inspection going forward to better evaluate.  I think he needs a repeat CT chest to evaluate for any evidence of evolving interstitial disease due to his multiple bouts of COVID-19.  We will perform a walking oximetry today.  I will also ask him to try using albuterol to see if he gets benefit given the mild airflow obstruction.    Baltazar Apo, MD, PhD 01/03/2022, 4:36 PM [facility] [phone] or if no answer before 7:00PM call [phone] For any issues after 7:00PM please call eLink [phone]

## 2022-01-03 NOTE — Addendum Note (Signed)
Addended by: Gavin Potters R on: 01/03/2022 04:45 PM   Modules accepted: Orders

## 2022-01-03 NOTE — Addendum Note (Signed)
Addended by: Gavin Potters R on: 01/03/2022 04:49 PM   Modules accepted: Orders

## 2022-01-03 NOTE — Progress Notes (Signed)
Full PFT Performed Today  

## 2022-01-12 ENCOUNTER — Ambulatory Visit (HOSPITAL_BASED_OUTPATIENT_CLINIC_OR_DEPARTMENT_OTHER)
Admission: RE | Admit: 2022-01-12 | Discharge: 2022-01-12 | Disposition: A | Discharge status: Home / Self Care | Payer: Medicare HMO | Source: Ambulatory Visit | Attending: Emergency Medicine | Admitting: Emergency Medicine

## 2022-01-12 DIAGNOSIS — J479 Bronchiectasis, uncomplicated: Secondary | ICD-10-CM | POA: Diagnosis not present

## 2022-01-12 DIAGNOSIS — R911 Solitary pulmonary nodule: Secondary | ICD-10-CM | POA: Diagnosis not present

## 2022-01-12 DIAGNOSIS — R0609 Other forms of dyspnea: Secondary | ICD-10-CM | POA: Diagnosis not present

## 2022-02-16 ENCOUNTER — Encounter: Payer: Self-pay | Admitting: Emergency Medicine

## 2022-02-16 ENCOUNTER — Ambulatory Visit (INDEPENDENT_AMBULATORY_CARE_PROVIDER_SITE_OTHER): Payer: Medicare HMO | Admitting: Emergency Medicine

## 2022-02-16 DIAGNOSIS — R0609 Other forms of dyspnea: Secondary | ICD-10-CM

## 2022-02-16 MED ORDER — STIOLTO RESPIMAT 2.5-2.5 MCG/ACT IN AERS: 2.0000 | INHALATION_SPRAY | Freq: Every day | RESPIRATORY_TRACT | 0 refills | Status: DC

## 2022-02-16 NOTE — Progress Notes (Signed)
Subjective:    Patient ID: Jeffrey [name], male    DOB: 01/11/1946, 76 y.o.   MRN: [id number]  HPI 76 year old never smoker with a history of HIV, CKD, hypertension with diastolic dysfunction, cerebrovascular disease.  He had COVID-19 in 2021 and has had some associated cognitive decline, progressive dyspnea since that time.  He was hospitalized in May 2022 with colitis, Sh[place]ella and treated again for COVID at that time.  He is referred today for evaluation of his progressive dyspnea.  He had pulmonary function testing as below.  Not currently on bronchodilator therapy. He describes exertional SOB, after walking about 100 ft. He has a chronic cough, non-productive. No real wheeze, maybe hears some UA noise at night. Has some controled GERD.   Pulmonary function testing performed today and reviewed by me shows evidence for mild obstruction, normal lung volumes, decreased diffusion capacity that does not fully correct to the normal range when adjusted for his alveolar volume.  His flow-volume loop has a very truncated expiratory component suggestive of possible variable intrathoracic obstruction  CT chest 08/04/2020 reviewed by me shows no mediastinal or hilar adenopathy, mild bilateral cylindrical bronchiectasis with some peripheral predominant interstitial reticulation principally at the bases  ROV 02/16/22 --76 year old man, never smoker with a history of HIV, CKD, hypertension and cerebrovascular disease.  He had COVID-19 in 2021 and then again in May 2876, complicated by some associated cognitive decline and progressive dyspnea.  Pulmonary function testing with mild obstruction and a decreased diffusion capacity.  He has mild bilateral bronchiectasis on prior chest imaging.  Spirometry with questionable variable intrathoracic obstruction, question tracheal bronchomalacia.  We planned for a repeat CT scan of the chest to look for interstitial disease or any endobronchial abnormality.  He did not  desaturate on ambulatory oximetry at his last visit.  He is not on any scheduled BD therapy, does have albuterol to use if needed.  He reports using it - does seem to help him some   Review of Systems As per HPI  Past Medical History:  Diagnosis Date   Chronic kidney disease    Colon polyps    COVID-19    05/2019   GERD (gastroesophageal reflux disease)    Heart murmur    History of kidney stones    HIV (human immunodeficiency virus infection) ([place]) 1999   Hypertension    Pneumonia    Stroke ([place])    right side weakness   TIA (transient ischemic attack)      Family History  Problem Relation Age of Onset   Hypertension Mother    Heart attack Mother 52   Alzheimer's disease Sister    Heart disease Sister    Alzheimer's disease Sister    Drug abuse Sister    COPD Brother    Kidney cancer Brother    Colon cancer Neg Hx    Esophageal cancer Neg Hx    Pancreatic cancer Neg Hx    Stomach cancer Neg Hx    Liver disease Neg Hx      Social History   Socioeconomic History   Marital status: Divorced    Spouse name: Not on file   Number of children: 2   Years of education: 14   Highest education level: Some college, no degree  Occupational History   Occupation: retired    Fish farm manager: [employer]  Tobacco Use   Smoking status: Never   Smokeless tobacco: Former    Types: Chew    Quit date: 2019  Vaping Use   Vaping Use: Never used  Substance and Sexual Activity   Alcohol use: Yes    Comment: rare - few per year   Drug use: No   Sexual activity: Not Currently    Partners: Female    Comment: declined condoms  Other Topics Concern   Not on file  Social History Narrative   Lives alone   Right handed    Caffeine use: tea, mainly water      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, environmental   2 children - 1 boy and 1 girl   Social Determinants of Health   Financial Resource Strain: Low Risk  (12/18/2020)   Overall Financial Resource Strain (CARDIA)    Difficulty of Paying Living  Expenses: Not hard at all  Food Insecurity: No Food Insecurity (12/18/2020)   Hunger Vital Sign    Worried About Running Out of Food in the Last Year: Never true    [place] in the Last Year: Never true  Transportation Needs: No Transportation Needs (12/18/2020)   PRAPARE - Hydrologist (Medical): No    Lack of Transportation (Non-Medical): No  Physical Activity: Sufficiently Active (12/18/2020)   Exercise Vital Sign    Days of Exercise per Week: 5 days    Minutes of Exercise per Session: 30 min  Stress: No Stress Concern Present (12/18/2020)   [place]    Feeling of Stress : Only a little  Social Connections: Moderately Isolated (12/18/2020)   Social Connection and Isolation Panel [NHANES]    Frequency of Communication with Friends and Family: Twice a week    Frequency of Social Gatherings with Friends and Family: Once a week    Attends Religious Services: Never    Marine scientist or Organizations: Yes    Attends Archivist Meetings: Never    Marital Status: Divorced  Human resources officer Violence: Not At Risk (12/18/2020)   Humiliation, Afraid, Rape, and Kick questionnaire    Fear of Current or Ex-Partner: No    Emotionally Abused: No    Physically Abused: No    Sexually Abused: No     Allergies  Allergen Reactions   Baclofen Other (See Comments)    Back pain. Patient said it made him feel crazy    Efavirenz Rash   Nevirapine Rash and Other (See Comments)     Outpatient Medications Prior to Visit  Medication Sig Dispense Refill   abacavir-dolutegravir-lamiVUDine (TRIUMEQ) 600-50-300 MG tablet Take 1 tablet by mouth daily. 30 tablet 11   albuterol (VENTOLIN HFA) 108 (90 Base) MCG/ACT inhaler Inhale 2 puffs into the lungs every 6 (six) hours as needed for wheezing or shortness of breath. 8 g 6   Ascorbic Acid (VITAMIN C) 1000 MG tablet Take 1,000 mg by mouth daily  at 6 PM.     aspirin EC 81 MG tablet Take 1 tablet (81 mg total) by mouth daily.     Cholecalciferol (VITAMIN D3) 125 MCG (5000 UT) TABS Take 5,000 Units by mouth daily at 6 PM.     clopidogrel (PLAVIX) 75 MG tablet Take 1 tablet (75 mg total) by mouth daily. 90 tablet 3   finasteride (PROSCAR) 5 MG tablet Take 5 mg by mouth daily.     losartan (COZAAR) 50 MG tablet Take 50 mg by mouth daily. Started by his kidney doctor- Dr. Candiss Norse - [facility]  pantoprazole (PROTONIX) 20 MG tablet Take 1 tablet (20 mg total) by mouth 2 (two) times daily. 180 tablet 3   rosuvastatin (CRESTOR) 20 MG tablet Take 1 tablet by mouth once daily 90 tablet 3   solifenacin (VESICARE) 10 MG tablet Take by mouth.     vitamin B-12 (CYANOCOBALAMIN) 500 MCG tablet Take 500 mcg by mouth daily.     Zinc 50 MG TABS Take 50 mg by mouth daily at 6 PM.     No facility-administered medications prior to visit.         Objective:   Physical Exam  Vitals:   02/16/22 1544  BP: 128/74  Pulse: 64  Temp: (!) 97.4 F (36.3 C)  TempSrc: Oral  SpO2: 99%  Weight: 174 lb 12.8 oz (79.3 kg)  Height: 5' 10"  (1.778 m)   Gen: Pleasant, well-nourished, in no distress,  normal affect  ENT: No lesions,  mouth clear,  oropharynx clear, no postnasal drip  Neck: No JVD, no stridor  Lungs: No use of accessory muscles, no crackles or wheezing on normal respiration, no wheeze on forced expiration  Cardiovascular: RRR, heart sounds normal, no murmur or gallops, no peripheral edema  Musculoskeletal: No deformities, no cyanosis or clubbing  Neuro: alert, awake, non focal  Skin: Warm, no lesions or rash     Assessment & Plan:  Dyspnea on exertion Get subjective benefit from albuterol.  I think it is reasonable to do a trial of Stiolto.  I do not see anything on his CT scan of the chest that would be consistent with an endobronchial lesion or would cause a variable intrathoracic obstruction.  If his dyspnea continues to be  problematic then I think bronchoscopy with airway inspection would be indicated.  His CT does show very subtle residual from COVID, basilar scar and bronchiectatic change.  No evidence of active inflammation.  We reviewed your CT scan of the chest today.  There are some very subtle changes present that are likely due to the effects of COVID-19 infection.  No concerning findings. We will try starting Stiolto 2 puffs once daily.  Keep track of how this medication helps you so we can discuss it next time. Keep albuterol available to use 2 puffs if needed for shortness of breath, chest tightness, wheezing. Follow with Dr Lamonte Sakai in 6 months or sooner if you have any problems  Time spent 30 minutes  Baltazar Apo, MD, PhD 02/16/2022, 4:11 PM [facility] [phone] or if no answer before 7:00PM call [phone] For any issues after 7:00PM please call eLink [phone]

## 2022-02-16 NOTE — Patient Instructions (Signed)
We reviewed your CT scan of the chest today.  There are some very subtle changes present that are likely due to the effects of COVID-19 infection.  No concerning findings. We will try starting Stiolto 2 puffs once daily.  Keep track of how this medication helps you so we can discuss it next time. Keep albuterol available to use 2 puffs if needed for shortness of breath, chest tightness, wheezing. Follow with Dr Lamonte Sakai in 6 months or sooner if you have any problems

## 2022-02-16 NOTE — Assessment & Plan Note (Signed)
Get subjective benefit from albuterol.  I think it is reasonable to do a trial of Stiolto.  I do not see anything on his CT scan of the chest that would be consistent with an endobronchial lesion or would cause a variable intrathoracic obstruction.  If his dyspnea continues to be problematic then I think bronchoscopy with airway inspection would be indicated.  His CT does show very subtle residual from COVID, basilar scar and bronchiectatic change.  No evidence of active inflammation.  We reviewed your CT scan of the chest today.  There are some very subtle changes present that are likely due to the effects of COVID-19 infection.  No concerning findings. We will try starting Stiolto 2 puffs once daily.  Keep track of how this medication helps you so we can discuss it next time. Keep albuterol available to use 2 puffs if needed for shortness of breath, chest tightness, wheezing. Follow with Dr Lamonte Sakai in 6 months or sooner if you have any problems

## 2022-02-16 NOTE — Addendum Note (Signed)
Addended by: Gavin Potters R on: 02/16/2022 04:14 PM   Modules accepted: Orders

## 2022-02-20 ENCOUNTER — Other Ambulatory Visit: Payer: Self-pay

## 2022-02-20 ENCOUNTER — Other Ambulatory Visit: Payer: Medicare HMO

## 2022-02-20 DIAGNOSIS — Z79899 Other long term (current) drug therapy: Secondary | ICD-10-CM | POA: Diagnosis not present

## 2022-02-20 DIAGNOSIS — B20 Human immunodeficiency virus [HIV] disease: Secondary | ICD-10-CM | POA: Diagnosis not present

## 2022-02-21 LAB — T-HELPER CELL (CD4) - (RCID CLINIC ONLY)
CD4 % Helper T Cell: 25 % — ABNORMAL LOW (ref 33–65)
CD4 T Cell Abs: 499 /uL (ref 400–1790)

## 2022-02-23 LAB — COMPREHENSIVE METABOLIC PANEL
AG Ratio: 1.6 (calc) (ref 1.0–2.5)
ALT: 14 U/L (ref 9–46)
AST: 12 U/L (ref 10–35)
Albumin: 4.3 g/dL (ref 3.6–5.1)
Alkaline phosphatase (APISO): 86 U/L (ref 35–144)
BUN/Creatinine Ratio: 14 (calc) (ref 6–22)
BUN: 26 mg/dL — ABNORMAL HIGH (ref 7–25)
CO2: 29 mmol/L (ref 20–32)
Calcium: 9.8 mg/dL (ref 8.6–10.3)
Chloride: 104 mmol/L (ref 98–110)
Creat: 1.91 mg/dL — ABNORMAL HIGH (ref 0.70–1.28)
Globulin: 2.7 g/dL (calc) (ref 1.9–3.7)
Glucose, Bld: 109 mg/dL — ABNORMAL HIGH (ref 65–99)
Potassium: 4.2 mmol/L (ref 3.5–5.3)
Sodium: 141 mmol/L (ref 135–146)
Total Bilirubin: 0.4 mg/dL (ref 0.2–1.2)
Total Protein: 7 g/dL (ref 6.1–8.1)

## 2022-02-23 LAB — CBC
HCT: 43.8 % (ref 38.5–50.0)
Hemoglobin: 14.9 g/dL (ref 13.2–17.1)
MCH: 34.9 pg — ABNORMAL HIGH (ref 27.0–33.0)
MCHC: 34 g/dL (ref 32.0–36.0)
MCV: 102.6 fL — ABNORMAL HIGH (ref 80.0–100.0)
MPV: 8.9 fL (ref 7.5–12.5)
Platelets: 175 10*3/uL (ref 140–400)
RBC: 4.27 10*6/uL (ref 4.20–5.80)
RDW: 12.2 % (ref 11.0–15.0)
WBC: 5.9 10*3/uL (ref 3.8–10.8)

## 2022-02-23 LAB — LIPID PANEL
Cholesterol: 127 mg/dL (ref <200)
HDL: 41 mg/dL (ref >=40)
LDL Cholesterol (Calc): 59 mg/dL (calc)
Non-HDL Cholesterol (Calc): 86 mg/dL (calc) (ref <130)
Total CHOL/HDL Ratio: 3.1 (calc) (ref <5.0)
Triglycerides: 203 mg/dL — ABNORMAL HIGH (ref <150)

## 2022-02-23 LAB — HIV-1 RNA QUANT-NO REFLEX-BLD
HIV 1 RNA Quant: NOT DETECTED Copies/mL
HIV-1 RNA Quant, Log: NOT DETECTED Log cps/mL

## 2022-02-23 LAB — RPR: RPR Ser Ql: NONREACTIVE

## 2022-03-07 ENCOUNTER — Ambulatory Visit (INDEPENDENT_AMBULATORY_CARE_PROVIDER_SITE_OTHER): Payer: Medicare HMO | Admitting: Internal Medicine

## 2022-03-07 ENCOUNTER — Ambulatory Visit (INDEPENDENT_AMBULATORY_CARE_PROVIDER_SITE_OTHER): Payer: Medicare HMO

## 2022-03-07 ENCOUNTER — Other Ambulatory Visit: Payer: Self-pay

## 2022-03-07 VITALS — BP 149/92 | HR 65

## 2022-03-07 DIAGNOSIS — B20 Human immunodeficiency virus [HIV] disease: Secondary | ICD-10-CM | POA: Diagnosis not present

## 2022-03-07 DIAGNOSIS — N1831 Chronic kidney disease, stage 3a: Secondary | ICD-10-CM | POA: Diagnosis not present

## 2022-03-07 DIAGNOSIS — J449 Chronic obstructive pulmonary disease, unspecified: Secondary | ICD-10-CM | POA: Diagnosis not present

## 2022-03-07 DIAGNOSIS — Z23 Encounter for immunization: Secondary | ICD-10-CM

## 2022-03-07 DIAGNOSIS — K219 Gastro-esophageal reflux disease without esophagitis: Secondary | ICD-10-CM

## 2022-03-07 DIAGNOSIS — N62 Hypertrophy of breast: Secondary | ICD-10-CM

## 2022-03-07 MED ORDER — TRIUMEQ 600-50-300 MG PO TABS: 1.0000 | ORAL_TABLET | Freq: Every day | ORAL | 11 refills | Status: DC

## 2022-03-07 NOTE — Assessment & Plan Note (Addendum)
His infection remains under excellent, long-term control.  He will continue Triumeq and follow-up after lab work in 1 year.  He received his annual influenza vaccine and updated COVID vaccine here today.

## 2022-03-07 NOTE — Assessment & Plan Note (Signed)
HIV could be playing a role in his chronic renal insufficiency but, if so, the recommendation is to keep his infection suppressed as it has been with Triumeq.  Triumeq is not associated with any nephrotoxicity.

## 2022-03-07 NOTE — Progress Notes (Signed)
Patient Active Problem List   Diagnosis Date Noted   Human immunodeficiency virus (HIV) disease ([place]) 02/27/2006    Priority: High   Gynecomastia 02/28/2021    Priority: Medium    Chronic kidney disease 12/20/2017    Priority: Medium    Hyperlipidemia 07/22/2012    Priority: Medium    Erectile dysfunction 07/22/2012    Priority: Medium    GERD 05/17/2010    Priority: Medium    Dysphagia 03/08/2009    Priority: Medium    Essential hypertension 02/27/2006    Priority: Medium    Dizziness 09/07/2021   Blurry vision, bilateral 09/07/2021   Bilateral occipital neuralgia 04/06/2021   Proteinuria 09/23/2020   Nocturnal polyuria 09/21/2020   Meralgia paresthetica, left lower limb 09/21/2020   Dehydration    Infectious colitis    Rib pain 07/13/2020   Syphilis (acquired) 06/22/2020   Rectal abnormality 05/27/2020   Neck pain 12/23/2019   Other headache syndrome 12/23/2019   Occipital neuralgia of right side 12/23/2019   CAD (coronary artery disease) 10/27/2019   Internal carotid artery stenosis, right 05/14/2019   Peripheral vertigo 74/99/8338   Diastolic CHF with preserved left ventricular function, NYHA class 2 ([place]) 01/06/2015   Dyspnea on exertion 09/30/2014   Insomnia 07/22/2014   BPH with obstruction/lower urinary tract symptoms 06/17/2014   CERVICAL LYMPHADENOPATHY 09/19/2007   CHOLECYSTECTOMY, HX OF 05/28/2006   PNEUMONIA, HX OF 02/27/2006   ARTHROSCOPY, KNEE, HX OF 02/27/2006    Patient's Medications  New Prescriptions   No medications on file  Previous Medications   ALBUTEROL (VENTOLIN HFA) 108 (90 BASE) MCG/ACT INHALER    Inhale 2 puffs into the lungs every 6 (six) hours as needed for wheezing or shortness of breath.   ASCORBIC ACID (VITAMIN C) 1000 MG TABLET    Take 1,000 mg by mouth daily at 6 PM.   ASPIRIN EC 81 MG TABLET    Take 1 tablet (81 mg total) by mouth daily.   CHOLECALCIFEROL (VITAMIN D3) 125 MCG (5000 UT) TABS    Take 5,000 Units by  mouth daily at 6 PM.   CLOPIDOGREL (PLAVIX) 75 MG TABLET    Take 1 tablet (75 mg total) by mouth daily.   FINASTERIDE (PROSCAR) 5 MG TABLET    Take 5 mg by mouth daily.   LOSARTAN (COZAAR) 50 MG TABLET    Take 50 mg by mouth daily. Started by his kidney doctor- Dr. Candiss Norse - [facility]   PANTOPRAZOLE (PROTONIX) 20 MG TABLET    Take 1 tablet (20 mg total) by mouth 2 (two) times daily.   ROSUVASTATIN (CRESTOR) 20 MG TABLET    Take 1 tablet by mouth once daily   SOLIFENACIN (VESICARE) 10 MG TABLET    Take by mouth.   TIOTROPIUM BROMIDE-OLODATEROL (STIOLTO RESPIMAT) 2.5-2.5 MCG/ACT AERS    Inhale 2 puffs into the lungs daily.   VITAMIN B-12 (CYANOCOBALAMIN) 500 MCG TABLET    Take 500 mcg by mouth daily.   ZINC 50 MG TABS    Take 50 mg by mouth daily at 6 PM.  Modified Medications   Modified Medication Previous Medication   ABACAVIR-DOLUTEGRAVIR-LAMIVUDINE (TRIUMEQ) 600-50-300 MG TABLET abacavir-dolutegravir-lamiVUDine (TRIUMEQ) 600-50-300 MG tablet      Take 1 tablet by mouth daily.    Take 1 tablet by mouth daily.  Discontinued Medications   No medications on file    Subjective: Jeffrey [name] is in for his routine HIV follow-up visit.  He denies any problems obtaining, taking or tolerating his Triumeq.  He has not been missing doses.  History of COPD and started on inhalers last month.  He is feeling much better.  His cough and shortness of breath are greatly improved and he is able to be much more active.  He has had COVID infection on 3 occasions in the past few years.  He has not had an updated COVID-vaccine or an annual influenza vaccine.  He has doubled his PPI dose recently and his GERD is back to reasonable control.  He had a mammogram shortly after his last visit with me last year which did confirm some slight right gynecomastia.  He has a little bit of tenderness but has not had any increase in breast size since last year.  Review of Systems: Review of Systems  Constitutional:  Negative for  fever and weight loss.  Respiratory:  Positive for cough and shortness of breath. Negative for sputum production.   Cardiovascular:  Negative for chest pain.  Gastrointestinal:  Positive for heartburn. Negative for abdominal pain, diarrhea, nausea and vomiting.  Psychiatric/Behavioral:  Negative for depression.     Past Medical History:  Diagnosis Date   Chronic kidney disease    Colon polyps    COVID-19    05/2019   GERD (gastroesophageal reflux disease)    Heart murmur    History of kidney stones    HIV (human immunodeficiency virus infection) ([place]) 1999   Hypertension    Pneumonia    Stroke ([place])    right side weakness   TIA (transient ischemic attack)     Social History   Tobacco Use   Smoking status: Never   Smokeless tobacco: Former    Types: Chew    Quit date: 2019  Vaping Use   Vaping Use: Never used  Substance Use Topics   Alcohol use: Yes    Comment: rare - few per year   Drug use: No    Family History  Problem Relation Age of Onset   Hypertension Mother    Heart attack Mother 79   Alzheimer's disease Sister    Heart disease Sister    Alzheimer's disease Sister    Drug abuse Sister    COPD Brother    Kidney cancer Brother    Colon cancer Neg Hx    Esophageal cancer Neg Hx    Pancreatic cancer Neg Hx    Stomach cancer Neg Hx    Liver disease Neg Hx     Allergies  Allergen Reactions   Baclofen Other (See Comments)    Back pain. Patient said it made him feel crazy    Efavirenz Rash   Nevirapine Rash and Other (See Comments)    Health Maintenance  Topic Date Due   Zoster Vaccines- Shingrix (1 of 2) Never done   COLONOSCOPY (Pts 45-79yr Insurance coverage will need to be confirmed)  08/03/2020   COVID-19 Vaccine (4 - Mixed Product risk series) 07/15/2021   Medicare Annual Wellness (AWV)  12/17/2021   INFLUENZA VACCINE  08/06/2022 (Originally 12/06/2021)   TETANUS/TDAP  01/05/2025   Pneumonia Vaccine 76 Years old  Completed   Hepatitis C  Screening  Completed   HPV VACCINES  Aged Out    Objective:  There were no vitals filed for this visit. There is no height or weight on file to calculate BMI.  Physical Exam Constitutional:      Comments: His spirits are good as usual.  Cardiovascular:  Rate and Rhythm: Normal rate.  Pulmonary:     Effort: Pulmonary effort is normal.  Psychiatric:        Mood and Affect: Mood normal.     Lab Results Lab Results  Component Value Date   WBC 5.9 02/20/2022   HGB 14.9 02/20/2022   HCT 43.8 02/20/2022   MCV 102.6 (H) 02/20/2022   PLT 175 02/20/2022    Lab Results  Component Value Date   CREATININE 1.91 (H) 02/20/2022   BUN 26 (H) 02/20/2022   NA 141 02/20/2022   K 4.2 02/20/2022   CL 104 02/20/2022   CO2 29 02/20/2022    Lab Results  Component Value Date   ALT 14 02/20/2022   AST 12 02/20/2022   ALKPHOS 63 09/06/2020   BILITOT 0.4 02/20/2022    Lab Results  Component Value Date   CHOL 127 02/20/2022   HDL 41 02/20/2022   LDLCALC 59 02/20/2022   TRIG 203 (H) 02/20/2022   CHOLHDL 3.1 02/20/2022   Lab Results  Component Value Date   LABRPR NON-REACTIVE 02/20/2022   HIV 1 RNA Quant  Date Value  02/20/2022 Not Detected Copies/mL  02/17/2021 35 Copies/mL (H)  09/07/2020 <20 copies/mL   CD4 T Cell Abs (/uL)  Date Value  02/20/2022 499  02/17/2021 396 (L)  02/10/2020 469     Problem List Items Addressed This Visit       High   Human immunodeficiency virus (HIV) disease ([place])    His infection remains under excellent, long-term control.  He will continue Triumeq and follow-up after lab work in 1 year.  He received his annual influenza vaccine and updated COVID vaccine here today.      Relevant Medications   abacavir-dolutegravir-lamiVUDine (TRIUMEQ) 600-50-300 MG tablet   Other Relevant Orders   CBC   T-helper cells (CD4) count (not at [facility])   Comprehensive metabolic panel   Lipid panel   RPR   HIV-1 RNA quant-no reflex-bld     Medium     GERD    He denies any significant difficulty swallowing Triumeq.      Chronic kidney disease    HIV could be playing a role in his chronic renal insufficiency but, if so, the recommendation is to keep his infection suppressed as it has been with Triumeq.  Triumeq is not associated with any nephrotoxicity.      Gynecomastia    He has stable gynecomastia which may be HIV related.         [name], MD [facility] [place] [facility] [phone] pager   [phone] cell 03/07/2022, 2:01 PM

## 2022-03-07 NOTE — Assessment & Plan Note (Signed)
He denies any significant difficulty swallowing Triumeq.

## 2022-03-07 NOTE — Assessment & Plan Note (Signed)
He has stable gynecomastia which may be HIV related.

## 2022-03-18 ENCOUNTER — Other Ambulatory Visit: Payer: Self-pay | Admitting: Emergency Medicine

## 2022-04-21 ENCOUNTER — Other Ambulatory Visit (HOSPITAL_COMMUNITY): Payer: Self-pay | Admitting: Interventional Radiology

## 2022-04-21 ENCOUNTER — Telehealth (HOSPITAL_COMMUNITY): Payer: Self-pay

## 2022-04-21 DIAGNOSIS — I771 Stricture of artery: Secondary | ICD-10-CM

## 2022-04-21 NOTE — Telephone Encounter (Signed)
Called to schedule an mra, no answer, left vm. AW

## 2022-04-22 ENCOUNTER — Other Ambulatory Visit: Payer: Self-pay | Admitting: Emergency Medicine

## 2022-04-28 ENCOUNTER — Ambulatory Visit (HOSPITAL_COMMUNITY)
Admission: EM | Admit: 2022-04-28 | Discharge: 2022-04-28 | Disposition: A | Discharge status: Home / Self Care | Payer: Medicare HMO | Attending: Physician Assistant | Admitting: Physician Assistant

## 2022-04-28 ENCOUNTER — Encounter (HOSPITAL_COMMUNITY): Payer: Self-pay

## 2022-04-28 DIAGNOSIS — R0602 Shortness of breath: Secondary | ICD-10-CM | POA: Diagnosis not present

## 2022-04-28 DIAGNOSIS — R051 Acute cough: Secondary | ICD-10-CM

## 2022-04-28 DIAGNOSIS — J069 Acute upper respiratory infection, unspecified: Secondary | ICD-10-CM

## 2022-04-28 MED ORDER — DM-GUAIFENESIN ER 30-600 MG PO TB12: 1.0000 | ORAL_TABLET | Freq: Two times a day (BID) | ORAL | 0 refills | Status: DC

## 2022-04-28 MED ORDER — PREDNISONE 10 MG PO TABS: 10.0000 mg | ORAL_TABLET | Freq: Three times a day (TID) | ORAL | 0 refills | Status: DC

## 2022-04-28 MED ORDER — ALBUTEROL SULFATE (2.5 MG/3ML) 0.083% IN NEBU
2.5000 mg | INHALATION_SOLUTION | Freq: Once | RESPIRATORY_TRACT | Status: AC
  Administered 2022-04-28: 2.5 mg via RESPIRATORY_TRACT

## 2022-04-28 MED ORDER — ALBUTEROL SULFATE (2.5 MG/3ML) 0.083% IN NEBU
INHALATION_SOLUTION | RESPIRATORY_TRACT | Status: AC
  Filled 2022-04-28: qty 3

## 2022-04-28 MED ORDER — TRIAMCINOLONE ACETONIDE 40 MG/ML IJ SUSP
INTRAMUSCULAR | Status: AC
  Filled 2022-04-28: qty 1

## 2022-04-28 MED ORDER — TRIAMCINOLONE ACETONIDE 40 MG/ML IJ SUSP
40.0000 mg | Freq: Once | INTRAMUSCULAR | Status: AC
  Administered 2022-04-28: 40 mg via INTRAMUSCULAR

## 2022-04-28 NOTE — Discharge Instructions (Addendum)
Advised to continue to use the albuterol rescue inhaler, 2 puffs every 6 hours for wheezing and shortness of breath. Advised take prednisone 10 mg 3 times a day for 5 days only to help decrease the respiratory inflammatory process. Advised take Mucinex DM every 12 hours to help decrease the cough and chest congestion. Advised follow-up PCP or return to urgent care if symptoms fail to improve.

## 2022-04-28 NOTE — ED Triage Notes (Signed)
Pt states cough and SOB for the past 4 days. States he has been using his inhaler at home.

## 2022-04-28 NOTE — ED Provider Notes (Signed)
[place]    CSN: [id number] Arrival date & time: 04/28/22  1244      History   Chief Complaint Chief Complaint  Patient presents with   Cough    HPI Jeffrey [name] is a 76 y.o. male.   76 year old male presents with cough and shortness of breath.  Patient indicates that he has a history of having COPD.  Patient indicates for the past 4 days he has been having recurrent cough with mild chest congestion with clear production.  He indicates having shortness of breath with mild intermittent wheezing.  Patient indicates he has been using his albuterol inhaler but this has not been given relief from his symptoms.  Patient denies having any fever or chills.  Patient indicates he does take his Stioto on a regular basis for COPD.  Patient without nausea or vomiting.   Cough Associated symptoms: shortness of breath     Past Medical History:  Diagnosis Date   Chronic kidney disease    Colon polyps    COVID-19    05/2019   GERD (gastroesophageal reflux disease)    Heart murmur    History of kidney stones    HIV (human immunodeficiency virus infection) ([place]) 1999   Hypertension    Pneumonia    Stroke [facility])    right side weakness   TIA (transient ischemic attack)     Patient Active Problem List   Diagnosis Date Noted   Dizziness 09/07/2021   Blurry vision, bilateral 09/07/2021   Bilateral occipital neuralgia 04/06/2021   Gynecomastia 02/28/2021   Proteinuria 09/23/2020   Nocturnal polyuria 09/21/2020   Meralgia paresthetica, left lower limb 09/21/2020   Dehydration    Infectious colitis    Rib pain 07/13/2020   Syphilis (acquired) 06/22/2020   Rectal abnormality 05/27/2020   Neck pain 12/23/2019   Other headache syndrome 12/23/2019   Occipital neuralgia of right side 12/23/2019   CAD (coronary artery disease) 10/27/2019   Internal carotid artery stenosis, right 05/14/2019   Peripheral vertigo 11/29/2018   Chronic kidney disease 84/53/6468   Diastolic  CHF with preserved left ventricular function, NYHA class 2 ([place]) 01/06/2015   Dyspnea on exertion 09/30/2014   Insomnia 07/22/2014   BPH with obstruction/lower urinary tract symptoms 06/17/2014   Hyperlipidemia 07/22/2012   Erectile dysfunction 07/22/2012   GERD 05/17/2010   Dysphagia 03/08/2009   CERVICAL LYMPHADENOPATHY 09/19/2007   CHOLECYSTECTOMY, HX OF 05/28/2006   Human immunodeficiency virus (HIV) disease ([place]) 02/27/2006   Essential hypertension 02/27/2006   PNEUMONIA, HX OF 02/27/2006   ARTHROSCOPY, KNEE, HX OF 02/27/2006    Past Surgical History:  Procedure Laterality Date   CHOLECYSTECTOMY  1982   COLONOSCOPY     ESOPHAGEAL MANOMETRY N/A 09/03/2017   Procedure: ESOPHAGEAL MANOMETRY (EM);  Surgeon: Mauri Pole, MD;  [facility];  Service: Endoscopy;  Laterality: N/A;   IR ANGIO INTRA EXTRACRAN SEL COM CAROTID INNOMINATE BILAT MOD SED  04/21/2019   IR ANGIO INTRA EXTRACRAN SEL COM CAROTID INNOMINATE BILAT MOD SED  05/11/2020   IR ANGIO VERTEBRAL SEL VERTEBRAL BILAT MOD SED  04/21/2019   IR ANGIO VERTEBRAL SEL VERTEBRAL BILAT MOD SED  05/11/2020   IR PTA INTRACRANIAL  05/14/2019   IR US GUIDE VASC ACCESS RIGHT  05/11/2020   KNEE SURGERY     LEFT KNEE   RADIOLOGY WITH ANESTHESIA N/A 05/14/2019   Procedure: IR WITH ANESTHESIA STENTING;  Surgeon: Luanne Bras, MD;  Location: [place];  Service: Radiology;  Laterality: N/A;   TONSILLECTOMY         Home Medications    Prior to Admission medications   Medication Sig Start Date End Date Taking? Authorizing Provider  dextromethorphan-guaiFENesin (MUCINEX DM) 30-600 MG 12hr tablet Take 1 tablet by mouth 2 (two) times daily. 04/28/22  Yes Nyoka Lint, PA-C  predniSONE (DELTASONE) 10 MG tablet Take 1 tablet (10 mg total) by mouth in the morning, at noon, and at bedtime. 04/28/22  Yes Nyoka Lint, PA-C  abacavir-dolutegravir-lamiVUDine (TRIUMEQ) 600-50-300 MG tablet Take 1 tablet by mouth daily. 03/07/22    [name], MD  albuterol (VENTOLIN HFA) 108 (90 Base) MCG/ACT inhaler Inhale 2 puffs into the lungs every 6 (six) hours as needed for wheezing or shortness of breath. 01/03/22   Byrum, Rose Fillers, MD  Ascorbic Acid (VITAMIN C) 1000 MG tablet Take 1,000 mg by mouth daily at 6 PM.    [provider]  aspirin EC 81 MG tablet Take 1 tablet (81 mg total) by mouth daily. 10/11/15   Karamalegos, Devonne Doughty, DO  Cholecalciferol (VITAMIN D3) 125 MCG (5000 UT) TABS Take 5,000 Units by mouth daily at 6 PM.    [provider]  clopidogrel (PLAVIX) 75 MG tablet Take 1 tablet (75 mg total) by mouth daily. 11/09/21   Josue Hector, MD  finasteride (PROSCAR) 5 MG tablet Take 5 mg by mouth daily.    [provider]  losartan (COZAAR) 50 MG tablet Take 50 mg by mouth daily. Started by his kidney doctor- Dr. Candiss Norse - [facility]    [provider]  pantoprazole (PROTONIX) 20 MG tablet Take 1 tablet (20 mg total) by mouth 2 (two) times daily. 11/22/21   Willia Craze, NP  rosuvastatin (CRESTOR) 20 MG tablet Take 1 tablet by mouth once daily 11/28/21   Josue Hector, MD  solifenacin (VESICARE) 10 MG tablet Take by mouth. 09/04/21   [provider]  STIOLTO RESPIMAT 2.5-2.5 MCG/ACT AERS INHALE 2 PUFFS BY MOUTH ONCE DAILY 04/24/22   Collene Gobble, MD  vitamin B-12 (CYANOCOBALAMIN) 500 MCG tablet Take 500 mcg by mouth daily.    [provider]  Zinc 50 MG TABS Take 50 mg by mouth daily at 6 PM.    [provider]    Family History Family History  Problem Relation Age of Onset   Hypertension Mother    Heart attack Mother 62   Alzheimer's disease Sister    Heart disease Sister    Alzheimer's disease Sister    Drug abuse Sister    COPD Brother    Kidney cancer Brother    Colon cancer Neg Hx    Esophageal cancer Neg Hx    Pancreatic cancer Neg Hx    Stomach cancer Neg Hx    Liver disease Neg Hx     Social History Social History    Tobacco Use   Smoking status: Never   Smokeless tobacco: Former    Types: Chew    Quit date: 2019  Vaping Use   Vaping Use: Never used  Substance Use Topics   Alcohol use: Yes    Comment: rare - few per year   Drug use: No     Allergies   Baclofen, Efavirenz, and Nevirapine   Review of Systems Review of Systems  Respiratory:  Positive for cough and shortness of breath.      Physical Exam Triage Vital Signs ED Triage Vitals  Enc Vitals Group  BP 04/28/22 1337 (!) 146/87     Pulse Rate 04/28/22 1337 86     Resp 04/28/22 1337 16     Temp 04/28/22 1337 97.8 F (36.6 C)     Temp Source 04/28/22 1337 Oral     SpO2 04/28/22 1337 94 %     Weight --      Height --      Head Circumference --      Peak Flow --      Pain Score 04/28/22 1338 0     Pain Loc --      Pain Edu? --      Excl. in [place]? --    No data found.  Updated Vital Signs BP (!) 146/87 (BP Location: Left Arm)   Pulse 86   Temp 97.8 F (36.6 C) (Oral)   Resp 16   SpO2 94%   Visual Acuity Right Eye Distance:   Left Eye Distance:   Bilateral Distance:    Right Eye Near:   Left Eye Near:    Bilateral Near:     Physical Exam Constitutional:      Appearance: Normal appearance.  HENT:     Right Ear: Tympanic membrane and ear canal normal.     Left Ear: Tympanic membrane and ear canal normal.     Mouth/Throat:     Mouth: Mucous membranes are moist.     Pharynx: Oropharynx is clear.  Cardiovascular:     Rate and Rhythm: Normal rate and regular rhythm.     Heart sounds: Normal heart sounds.  Pulmonary:     Effort: Pulmonary effort is normal.     Breath sounds: Normal breath sounds and air entry. No wheezing, rhonchi or rales.  Lymphadenopathy:     Cervical: No cervical adenopathy.  Neurological:     Mental Status: He is alert.      UC Treatments / Results  Labs (all labs ordered are listed, but only abnormal results are displayed) Labs Reviewed - No data to  display  EKG   Radiology No results found.  Procedures Procedures (including critical care time)  Medications Ordered in UC Medications  albuterol (PROVENTIL) (2.5 MG/3ML) 0.083% nebulizer solution 2.5 mg (2.5 mg Nebulization Given 04/28/22 1407)  triamcinolone acetonide (KENALOG-40) injection 40 mg (40 mg Intramuscular Given 04/28/22 1408)    Initial Impression / Assessment and Plan / UC Course  I have reviewed the triage vital signs and the nursing notes.  Pertinent labs & imaging results that were available during my care of the patient were reviewed by me and considered in my medical decision making (see chart for details).    Plan: 1.  The acute cough will be treated with the following: A.  Mucinex DM every 12 hours to control the cough and chest congestion. 2.  The shortness of breath be treated with the following: A.  Kenalog 40 mg IM given today in the office. B.  Prednisone 10 mg every 8 hours for 5 days only to help reduce the respiratory inflammatory component. C.  Patient was given albuterol nebulizer DuoNeb treatment 0.083% in the office today. 3.  Patient advised to continue using albuterol inhaler as needed along with his [place] inhaler for COPD. 4.  Patient advised follow-up PCP or return to urgent care as needed. Final Clinical Impressions(s) / UC Diagnoses   Final diagnoses:  Acute cough  Viral upper respiratory tract infection  Shortness of breath     Discharge Instructions  Advised to continue to use the albuterol rescue inhaler, 2 puffs every 6 hours for wheezing and shortness of breath. Advised take prednisone 10 mg 3 times a day for 5 days only to help decrease the respiratory inflammatory process. Advised take Mucinex DM every 12 hours to help decrease the cough and chest congestion. Advised follow-up PCP or return to urgent care if symptoms fail to improve.    ED Prescriptions     Medication Sig Dispense Auth. Provider   predniSONE  (DELTASONE) 10 MG tablet Take 1 tablet (10 mg total) by mouth in the morning, at noon, and at bedtime. 15 tablet Nyoka Lint, PA-C   dextromethorphan-guaiFENesin [facility] DM) 30-600 MG 12hr tablet Take 1 tablet by mouth 2 (two) times daily. 20 tablet Nyoka Lint, PA-C      PDMP not reviewed this encounter.   Nyoka Lint, PA-C 04/28/22 1449

## 2022-05-02 ENCOUNTER — Ambulatory Visit (INDEPENDENT_AMBULATORY_CARE_PROVIDER_SITE_OTHER): Payer: Medicare HMO | Admitting: Neurology

## 2022-05-02 ENCOUNTER — Encounter: Payer: Self-pay | Admitting: Neurology

## 2022-05-02 VITALS — BP 144/94 | HR 92 | Ht 70.0 in | Wt 170.1 lb

## 2022-05-02 DIAGNOSIS — G4489 Other headache syndrome: Secondary | ICD-10-CM

## 2022-05-02 DIAGNOSIS — M5481 Occipital neuralgia: Secondary | ICD-10-CM | POA: Diagnosis not present

## 2022-05-02 DIAGNOSIS — B20 Human immunodeficiency virus [HIV] disease: Secondary | ICD-10-CM

## 2022-05-02 DIAGNOSIS — M542 Cervicalgia: Secondary | ICD-10-CM

## 2022-05-02 DIAGNOSIS — I6521 Occlusion and stenosis of right carotid artery: Secondary | ICD-10-CM

## 2022-05-02 NOTE — Progress Notes (Signed)
[facility]  PATIENT: Jeffrey [name] DOB: 04/24/1946  REFERRING DOCTOR OR PCP: Theotis Burrow, MD; Mina Marble (PCP) SOURCE: Patient, notes from primary care  _________________________________   HISTORICAL  CHIEF COMPLAINT:  Chief Complaint  Patient presents with   Follow-up    Pt in room #11 and alone. Pt here today to f/u with his occipital neuralgia.    HISTORY OF PRESENT ILLNESS:  Jeffrey [name] is a 76 - year-old man with HA.  Update 05/02/2022. He reports the splenius capitus/occipitla nerve injections/blocks helped x 5 months but over the last month the pain has returned.    He also notes reduced ROM in the neck.   Pain is in the upper neck/occiput and radiates to the back of the head towards the vertex.   Pain is worse on the left but is bilateral.      He was doing well until a few weeks ago and HA's returned.  However, he was placed ona asteroid pack (and shot first) last week and pain is much better.    Some mornings he has blurry but not double vision.   This clears up over the next hour.    MRI/MRA 09/30/2021 showed no occlusion or new stenosis but he has chronic multifocal aterosclerotic narrowing.    No acute findings in brain.     He is still on ASA and plavix   He denies new stroke lie symptoms.    He had balloon angioplasty supraclinoid ICA 05/14/2019.  An MRA 10/30/2019 showed 60% paraclinoid segment stenosis of the right ICA and moderate stenosis of the M2 segment of the right MCA and left MCA, and moderate stenosis of the left PCA.     CT scan 12/16/19 showed mild SVID and expanded Tawny Hopping Robbin space next to the basal ganglia on the left   He is HIV positive.  He has stage 3 chronic renal disease.   Creatinine is 1.54.     REVIEW OF SYSTEMS: Constitutional: No fevers, chills, sweats, or change in appetite Eyes: No visual changes, double vision, eye pain Ear, nose and throat: No hearing loss, ear pain, nasal congestion, sore  throat Cardiovascular: No chest pain, palpitations Respiratory:  No shortness of breath at rest or with exertion.   No wheezes GastrointestinaI: No nausea, vomiting, diarrhea, abdominal pain, fecal incontinence Genitourinary:  No dysuria, urinary retention or frequency.  No nocturia. Musculoskeletal:  No neck pain, back pain Integumentary: No rash, pruritus, skin lesions Neurological: as above Psychiatric: No depression at this time.  No anxiety Endocrine: No palpitations, diaphoresis, change in appetite, change in weigh or increased thirst Hematologic/Lymphatic:  No anemia, purpura, petechiae. Allergic/Immunologic: No itchy/runny eyes, nasal congestion, recent allergic reactions, rashes  ALLERGIES: Allergies  Allergen Reactions   Baclofen Other (See Comments)    Back pain. Patient said it made him feel crazy    Efavirenz Rash   Nevirapine Rash and Other (See Comments)    HOME MEDICATIONS:  Current Outpatient Medications:    abacavir-dolutegravir-lamiVUDine (TRIUMEQ) 600-50-300 MG tablet, Take 1 tablet by mouth daily., Disp: 30 tablet, Rfl: 11   albuterol (VENTOLIN HFA) 108 (90 Base) MCG/ACT inhaler, Inhale 2 puffs into the lungs every 6 (six) hours as needed for wheezing or shortness of breath., Disp: 8 g, Rfl: 6   Ascorbic Acid (VITAMIN C) 1000 MG tablet, Take 1,000 mg by mouth daily at 6 PM., Disp: , Rfl:    aspirin EC 81 MG tablet, Take 1 tablet (81 mg total) by mouth  daily., Disp: , Rfl:    Cholecalciferol (VITAMIN D3) 125 MCG (5000 UT) TABS, Take 5,000 Units by mouth daily at 6 PM., Disp: , Rfl:    clopidogrel (PLAVIX) 75 MG tablet, Take 1 tablet (75 mg total) by mouth daily., Disp: 90 tablet, Rfl: 3   dextromethorphan-guaiFENesin (MUCINEX DM) 30-600 MG 12hr tablet, Take 1 tablet by mouth 2 (two) times daily., Disp: 20 tablet, Rfl: 0   finasteride (PROSCAR) 5 MG tablet, Take 5 mg by mouth daily., Disp: , Rfl:    losartan (COZAAR) 50 MG tablet, Take 50 mg by mouth daily.  Started by his kidney doctor- Dr. Candiss Norse - [facility], Disp: , Rfl:    pantoprazole (PROTONIX) 20 MG tablet, Take 1 tablet (20 mg total) by mouth 2 (two) times daily., Disp: 180 tablet, Rfl: 3   predniSONE (DELTASONE) 10 MG tablet, Take 1 tablet (10 mg total) by mouth in the morning, at noon, and at bedtime., Disp: 15 tablet, Rfl: 0   rosuvastatin (CRESTOR) 20 MG tablet, Take 1 tablet by mouth once daily, Disp: 90 tablet, Rfl: 3   solifenacin (VESICARE) 10 MG tablet, Take by mouth., Disp: , Rfl:    STIOLTO RESPIMAT 2.5-2.5 MCG/ACT AERS, INHALE 2 PUFFS BY MOUTH ONCE DAILY, Disp: 4 g, Rfl: 0   vitamin B-12 (CYANOCOBALAMIN) 500 MCG tablet, Take 500 mcg by mouth daily., Disp: , Rfl:    Zinc 50 MG TABS, Take 50 mg by mouth daily at 6 PM., Disp: , Rfl:   PAST MEDICAL HISTORY: Past Medical History:  Diagnosis Date   Chronic kidney disease    Colon polyps    COVID-19    05/2019   GERD (gastroesophageal reflux disease)    Heart murmur    History of kidney stones    HIV (human immunodeficiency virus infection) ([place]) 1999   Hypertension    Pneumonia    Stroke ([place])    right side weakness   TIA (transient ischemic attack)     PAST SURGICAL HISTORY: Past Surgical History:  Procedure Laterality Date   CHOLECYSTECTOMY  1982   COLONOSCOPY     ESOPHAGEAL MANOMETRY N/A 09/03/2017   Procedure: ESOPHAGEAL MANOMETRY (EM);  Surgeon: Mauri Pole, MD;  [facility];  Service: Endoscopy;  Laterality: N/A;   IR ANGIO INTRA EXTRACRAN SEL COM CAROTID INNOMINATE BILAT MOD SED  04/21/2019   IR ANGIO INTRA EXTRACRAN SEL COM CAROTID INNOMINATE BILAT MOD SED  05/11/2020   IR ANGIO VERTEBRAL SEL VERTEBRAL BILAT MOD SED  04/21/2019   IR ANGIO VERTEBRAL SEL VERTEBRAL BILAT MOD SED  05/11/2020   IR PTA INTRACRANIAL  05/14/2019   IR US GUIDE VASC ACCESS RIGHT  05/11/2020   KNEE SURGERY     LEFT KNEE   RADIOLOGY WITH ANESTHESIA N/A 05/14/2019   Procedure: IR WITH ANESTHESIA STENTING;  Surgeon:  Luanne Bras, MD;  Location: [place];  Service: Radiology;  Laterality: N/A;   TONSILLECTOMY      FAMILY HISTORY: Family History  Problem Relation Age of Onset   Hypertension Mother    Heart attack Mother 20   Alzheimer's disease Sister    Heart disease Sister    Alzheimer's disease Sister    Drug abuse Sister    COPD Brother    Kidney cancer Brother    Colon cancer Neg Hx    Esophageal cancer Neg Hx    Pancreatic cancer Neg Hx    Stomach cancer Neg Hx    Liver disease Neg Hx  SOCIAL HISTORY:  Social History   Socioeconomic History   Marital status: Divorced    Spouse name: Not on file   Number of children: 2   Years of education: 14   Highest education level: Some college, no degree  Occupational History   Occupation: retired    Fish farm manager: [employer]  Tobacco Use   Smoking status: Never   Smokeless tobacco: Former    Types: Chew    Quit date: 2019  Vaping Use   Vaping Use: Never used  Substance and Sexual Activity   Alcohol use: Yes    Comment: rare - few per year   Drug use: No   Sexual activity: Not Currently    Partners: Female    Comment: declined condoms  Other Topics Concern   Not on file  Social History Narrative   Lives alone   Right handed    Caffeine use: tea, mainly water      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, environmental   2 children - 1 boy and 1 girl   Social Determinants of Health   Financial Resource Strain: Low Risk  (12/18/2020)   Overall Financial Resource Strain (CARDIA)    Difficulty of Paying Living Expenses: Not hard at all  Food Insecurity: No Food Insecurity (12/18/2020)   Hunger Vital Sign    Worried About Running Out of Food in the Last Year: Never true    [place] in the Last Year: Never true  Transportation Needs: No Transportation Needs (12/18/2020)   PRAPARE - Hydrologist (Medical): No    Lack of Transportation (Non-Medical): No  Physical Activity: Sufficiently Active (12/18/2020)    Exercise Vital Sign    Days of Exercise per Week: 5 days    Minutes of Exercise per Session: 30 min  Stress: No Stress Concern Present (12/18/2020)   [place]    Feeling of Stress : Only a little  Social Connections: Moderately Isolated (12/18/2020)   Social Connection and Isolation Panel [NHANES]    Frequency of Communication with Friends and Family: Twice a week    Frequency of Social Gatherings with Friends and Family: Once a week    Attends Religious Services: Never    Marine scientist or Organizations: Yes    Attends Archivist Meetings: Never    Marital Status: Divorced  Human resources officer Violence: Not At Risk (12/18/2020)   Humiliation, Afraid, Rape, and Kick questionnaire    Fear of Current or Ex-Partner: No    Emotionally Abused: No    Physically Abused: No    Sexually Abused: No     PHYSICAL EXAM  Vitals:   05/02/22 1305  BP: (!) 144/94  Pulse: 92  Weight: 170 lb 1.6 oz (77.2 kg)  Height: 5' 10"  (1.778 m)    Body mass index is 24.41 kg/m.   General: The patient is well-developed and well-nourished and in no acute distress  HEENT:  Head is [place]/AT.     Neck: The neck does not have much tenderness currently.  Range of motion was mildly reduced...    Neurologic Exam  Mental status: The patient is alert and oriented x 3 at the time of the examination. The patient has apparent normal recent and remote memory, with an apparently normal attention span and concentration ability.   Speech is normal.  Cranial nerves: Extraocular movements are full.   Facial symmetry is present.  Facial strength and sensation was normal.  No obvious hearing deficits are noted.  Motor:  Muscle bulk is normal.   Tone is normal. Strength is  5 / 5 in all 4 extremities.   Sensory: Intact to vibration and touch x4.  Coordination: Cerebellar testing reveals good finger-nose-finger and heel-to-shin  bilaterally.  Gait and station: Station is normal.   Gait is slightly wide. Tandem gait is wide. Romberg is negative.   Reflexes: Deep tendon reflexes are symmetric and normal bilaterally.  Marland Kitchen    DIAGNOSTIC DATA (LABS, IMAGING, TESTING) - I reviewed patient records, labs, notes, testing and imaging myself where available.  Lab Results  Component Value Date   WBC 5.9 02/20/2022   HGB 14.9 02/20/2022   HCT 43.8 02/20/2022   MCV 102.6 (H) 02/20/2022   PLT 175 02/20/2022      Component Value Date/Time   NA 141 02/20/2022 0851   NA 141 09/07/2021 1631   K 4.2 02/20/2022 0851   CL 104 02/20/2022 0851   CO2 29 02/20/2022 0851   GLUCOSE 109 (H) 02/20/2022 0851   BUN 26 (H) 02/20/2022 0851   BUN 24 09/07/2021 1631   CREATININE 1.91 (H) 02/20/2022 0851   CALCIUM 9.8 02/20/2022 0851   PROT 7.0 02/20/2022 0851   PROT 6.4 01/26/2020 0805   ALBUMIN 2.5 (L) 09/06/2020 1150   ALBUMIN 4.3 01/26/2020 0805   AST 12 02/20/2022 0851   ALT 14 02/20/2022 0851   ALKPHOS 63 09/06/2020 1150   BILITOT 0.4 02/20/2022 0851   BILITOT 0.4 01/26/2020 0805   GFRNONAA 57 (L) 09/09/2020 0130   GFRNONAA 39 (L) 10/16/2014 1421   GFRAA >60 12/16/2019 1826   GFRAA 45 (L) 10/16/2014 1421   Lab Results  Component Value Date   CHOL 127 02/20/2022   HDL 41 02/20/2022   LDLCALC 59 02/20/2022   TRIG 203 (H) 02/20/2022   CHOLHDL 3.1 02/20/2022   Lab Results  Component Value Date   HGBA1C 5.5 09/21/2020        ASSESSMENT AND PLAN  Human immunodeficiency virus (HIV) disease (HCC)  Occipital neuralgia of right side  Other headache syndrome  Neck pain  Internal carotid artery stenosis, right   1.  Headaches are currently doing fairly well.  I discussed with him that if pain flares up again we could repeat the occipital nerve block/trigger point injection or a tricyclic. 2.  Stay active and exercise as tolerated. 3.   Continue Plavix and aspirin 4.   filled out DMV form.  RTC 6 months or as  needed.       Aphrodite Harpenau A. Felecia Shelling, MD, [facility] 03/00/9233, 0:07 PM Certified in Neurology, Clinical Neurophysiology, Sleep Medicine and Neuroimaging  [facility] [address], [place], [place] [address] [phone]

## 2022-05-03 ENCOUNTER — Ambulatory Visit (HOSPITAL_COMMUNITY)
Admission: RE | Admit: 2022-05-03 | Discharge: 2022-05-03 | Disposition: A | Discharge status: Home / Self Care | Payer: Medicare HMO | Source: Ambulatory Visit | Attending: Interventional Radiology | Admitting: Interventional Radiology

## 2022-05-03 DIAGNOSIS — I771 Stricture of artery: Secondary | ICD-10-CM | POA: Diagnosis present

## 2022-05-03 DIAGNOSIS — I6611 Occlusion and stenosis of right anterior cerebral artery: Secondary | ICD-10-CM | POA: Diagnosis not present

## 2022-05-03 DIAGNOSIS — I6622 Occlusion and stenosis of left posterior cerebral artery: Secondary | ICD-10-CM | POA: Insufficient documentation

## 2022-05-03 DIAGNOSIS — I6521 Occlusion and stenosis of right carotid artery: Secondary | ICD-10-CM | POA: Diagnosis not present

## 2022-05-04 ENCOUNTER — Ambulatory Visit: Payer: Medicare HMO | Admitting: Neurology

## 2022-05-15 ENCOUNTER — Telehealth (HOSPITAL_COMMUNITY): Payer: Self-pay

## 2022-05-15 DIAGNOSIS — N1831 Chronic kidney disease, stage 3a: Secondary | ICD-10-CM | POA: Diagnosis not present

## 2022-05-15 NOTE — Telephone Encounter (Signed)
Called pt regarding recent imaging, no answer, left vm. AW  

## 2022-05-16 ENCOUNTER — Telehealth (HOSPITAL_COMMUNITY): Payer: Self-pay

## 2022-05-16 NOTE — Telephone Encounter (Signed)
Pt agreed to f/u in 6 months with a mra head wo. AW

## 2022-05-24 DIAGNOSIS — N2581 Secondary hyperparathyroidism of renal origin: Secondary | ICD-10-CM | POA: Diagnosis not present

## 2022-05-24 DIAGNOSIS — D631 Anemia in chronic kidney disease: Secondary | ICD-10-CM | POA: Diagnosis not present

## 2022-05-24 DIAGNOSIS — I129 Hypertensive chronic kidney disease with stage 1 through stage 4 chronic kidney disease, or unspecified chronic kidney disease: Secondary | ICD-10-CM | POA: Diagnosis not present

## 2022-05-24 DIAGNOSIS — R809 Proteinuria, unspecified: Secondary | ICD-10-CM | POA: Diagnosis not present

## 2022-05-24 DIAGNOSIS — N1831 Chronic kidney disease, stage 3a: Secondary | ICD-10-CM | POA: Diagnosis not present

## 2022-05-28 ENCOUNTER — Other Ambulatory Visit: Payer: Self-pay | Admitting: Emergency Medicine

## 2022-06-19 NOTE — Progress Notes (Unsigned)
I connected with  Jeffrey [name] on 06/20/2022 by a audio enabled telemedicine application and verified that I am speaking with the correct person using two identifiers.  Patient Location: Home  Provider Location: Home Office  I discussed the limitations of evaluation and management by telemedicine. The patient expressed understanding and agreed to proceed.  Subjective:   Jeffrey [name] is a 77 y.o. male who presents for Medicare Annual/Subsequent preventive examination.  Review of Systems    Per HPI unless specifically indicated below.  Cardiac Risk Factors include: advanced age (>76mn, >>60women);male gender, Essential Hypertension, CAD, and  Hyperlipidemia.          Objective:       05/02/2022    1:05 PM 04/28/2022    1:37 PM 03/07/2022    2:02 PM  Vitals with BMI  Height 5' 10"$     Weight 170 lbs 2 oz    BMI [id number]   Systolic [id number] Diastolic 94 87 92  Pulse 92 86 65    There were no vitals filed for this visit. There is no height or weight on file to calculate BMI.     09/07/2021    3:33 PM 04/06/2021    2:51 PM 02/28/2021    9:24 AM 12/17/2020    5:32 PM 11/22/2020    3:54 PM 09/21/2020    9:28 AM 09/06/2020   10:00 PM  Advanced Directives  Does Patient Have a Medical Advance Directive? No No No Yes No No   Type of Advance Directive    Jeffrey [name]    Does patient want to make changes to medical advance directive?    Yes (MAU/Ambulatory/Procedural Areas - Information given)     [place]?    No - copy requested     Would patient like information on creating a medical advance directive? No - Patient declined No - Patient declined No - Patient declined No - Patient declined No - Patient declined No - Patient declined No - Patient declined    Current Medications (verified) Outpatient Encounter Medications as of 06/20/2022  Medication Sig   abacavir-dolutegravir-lamiVUDine (TRIUMEQ) 600-50-300 MG tablet  Take 1 tablet by mouth daily.   albuterol (VENTOLIN HFA) 108 (90 Base) MCG/ACT inhaler Inhale 2 puffs into the lungs every 6 (six) hours as needed for wheezing or shortness of breath.   Ascorbic Acid (VITAMIN C) 1000 MG tablet Take 1,000 mg by mouth daily at 6 PM.   aspirin EC 81 MG tablet Take 1 tablet (81 mg total) by mouth daily.   Cholecalciferol (VITAMIN D3) 125 MCG (5000 UT) TABS Take 5,000 Units by mouth daily at 6 PM.   clopidogrel (PLAVIX) 75 MG tablet Take 1 tablet (75 mg total) by mouth daily.   dextromethorphan-guaiFENesin (MUCINEX DM) 30-600 MG 12hr tablet Take 1 tablet by mouth 2 (two) times daily.   finasteride (PROSCAR) 5 MG tablet Take 5 mg by mouth daily.   losartan (COZAAR) 50 MG tablet Take 50 mg by mouth daily. Started by his kidney doctor- [facility]   pantoprazole (PROTONIX) 20 MG tablet Take 1 tablet (20 mg total) by mouth 2 (two) times daily.   predniSONE (DELTASONE) 10 MG tablet Take 1 tablet (10 mg total) by mouth in the morning, at noon, and at bedtime.   rosuvastatin (CRESTOR) 20 MG tablet Take 1 tablet by mouth once daily   solifenacin (VESICARE) 10  MG tablet Take by mouth.   STIOLTO RESPIMAT 2.5-2.5 MCG/ACT AERS INHALE 2 PUFFS BY MOUTH ONCE DAILY   vitamin B-12 (CYANOCOBALAMIN) 500 MCG tablet Take 500 mcg by mouth daily.   Zinc 50 MG TABS Take 50 mg by mouth daily at 6 PM.   No facility-administered encounter medications on file as of 06/20/2022.    Allergies (verified) Baclofen, Efavirenz, and Nevirapine   History: Past Medical History:  Diagnosis Date   Chronic kidney disease    Colon polyps    COVID-19    05/2019   GERD (gastroesophageal reflux disease)    Heart murmur    History of kidney stones    HIV (human immunodeficiency virus infection) ([place]) 1999   Hypertension    Pneumonia    Stroke ([place])    right side weakness   TIA (transient ischemic attack)    Past Surgical History:  Procedure Laterality Date   CHOLECYSTECTOMY   1982   COLONOSCOPY     ESOPHAGEAL MANOMETRY N/A 09/03/2017   Procedure: ESOPHAGEAL MANOMETRY (EM);  Surgeon: Mauri Pole, MD;  Location: [facility];  Service: Endoscopy;  Laterality: N/A;   IR ANGIO INTRA EXTRACRAN SEL COM CAROTID INNOMINATE BILAT MOD SED  04/21/2019   IR ANGIO INTRA EXTRACRAN SEL COM CAROTID INNOMINATE BILAT MOD SED  05/11/2020   IR ANGIO VERTEBRAL SEL VERTEBRAL BILAT MOD SED  04/21/2019   IR ANGIO VERTEBRAL SEL VERTEBRAL BILAT MOD SED  05/11/2020   IR PTA INTRACRANIAL  05/14/2019   IR US GUIDE VASC ACCESS RIGHT  05/11/2020   KNEE SURGERY     LEFT KNEE   RADIOLOGY WITH ANESTHESIA N/A 05/14/2019   Procedure: IR WITH ANESTHESIA STENTING;  Surgeon: Luanne Bras, MD;  Location: [facility];  Service: Radiology;  Laterality: N/A;   TONSILLECTOMY     Family History  Problem Relation Age of Onset   Hypertension Mother    Heart attack Mother 59   Alzheimer's disease Sister    Heart disease Sister    Alzheimer's disease Sister    Drug abuse Sister    COPD Brother    Kidney cancer Brother    Colon cancer Neg Hx    Esophageal cancer Neg Hx    Pancreatic cancer Neg Hx    Stomach cancer Neg Hx    Liver disease Neg Hx    Social History   Socioeconomic History   Marital status: Divorced    Spouse name: Not on file   Number of children: 2   Years of education: 14   Highest education level: Some college, no degree  Occupational History   Occupation: retired    Fish farm manager: [employer]  Tobacco Use   Smoking status: Never   Smokeless tobacco: Former    Types: Chew    Quit date: 2019  Vaping Use   Vaping Use: Never used  Substance and Sexual Activity   Alcohol use: Yes    Comment: rare - few per year   Drug use: No   Sexual activity: Not Currently    Partners: Female    Comment: declined condoms  Other Topics Concern   Not on file  Social History Narrative   Lives alone   Right handed    Caffeine use: tea, mainly water      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, environmental    2 children - 1 boy and 1 girl   Social Determinants of Health   Financial Resource Strain: Low Risk  (12/18/2020)  Overall Financial Resource Strain (CARDIA)    Difficulty of Paying Living Expenses: Not hard at all  Food Insecurity: No Food Insecurity (12/18/2020)   Hunger Vital Sign    Worried About Running Out of Food in the Last Year: Never true    Ran Out of Food in the Last Year: Never true  Transportation Needs: No Transportation Needs (12/18/2020)   PRAPARE - Hydrologist (Medical): No    Lack of Transportation (Non-Medical): No  Physical Activity: Sufficiently Active (12/18/2020)   Exercise Vital Sign    Days of Exercise per Week: 5 days    Minutes of Exercise per Session: 30 min  Stress: No Stress Concern Present (12/18/2020)   [place]    Feeling of Stress : Only a little  Social Connections: Moderately Isolated (12/18/2020)   Social Connection and Isolation Panel [NHANES]    Frequency of Communication with Friends and Family: Twice a week    Frequency of Social Gatherings with Friends and Family: Once a week    Attends Religious Services: Never    Marine scientist or Organizations: Yes    Attends Archivist Meetings: Never    Marital Status: Divorced    Tobacco Counseling Counseling given: Not Answered   Clinical Intake:                 Diabetic?no         Activities of Daily Living     No data to display          Patient Care Team: Gerrit Heck, MD as PCP - General (Family Medicine) [name], MD as PCP - Infectious Diseases (Infectious Diseases) Josue Hector, MD as PCP - Cardiology (Cardiology)  Indicate any recent Medical Services you may have received from other than [facility] providers in the past year (date may be approximate).     Assessment:   This is a routine wellness examination for [place].  Hearing/Vision  screen Denies any hearing issues. Denies any vision changes. Annual Eye Exam,  Dietary issues and exercise activities discussed:     Goals Addressed   None    Depression Screen    03/07/2022    1:56 PM 09/07/2021    3:32 PM 04/06/2021    2:52 PM 02/28/2021    9:25 AM 12/17/2020    5:15 PM 11/22/2020    3:54 PM 09/21/2020   10:37 AM  PHQ 2/9 Scores  PHQ - 2 Score 0 0 0 0 0 0 0  PHQ- 9 Score  4 2 3 $ 0 2 1    Fall Risk    03/07/2022    1:56 PM 04/06/2021    2:50 PM 03/03/2021    9:48 AM 02/28/2021    9:24 AM 12/17/2020    5:15 PM  Fall Risk   Falls in the past year? 0 0 0 0 0  Number falls in past yr: 0 0 0 0 0  Injury with Fall? 0 0 0 0 0  Risk for fall due to :     No Fall Risks  Follow up     Falls evaluation completed    FALL RISK PREVENTION PERTAINING TO THE HOME:  Any stairs in or around the home? {YES/NO:21197} If so, are there any without handrails? {YES/NO:21197} Home free of loose throw rugs in walkways, pet beds, electrical cords, etc? {YES/NO:21197} Adequate lighting in your home to reduce risk of falls? {  YES/NO:21197}  ASSISTIVE DEVICES UTILIZED TO PREVENT FALLS:  Life alert? {YES/NO:21197} Use of a cane, walker or w/c? {YES/NO:21197} Grab bars in the bathroom? {YES/NO:21197} Shower chair or bench in shower? {YES/NO:21197} Elevated toilet seat or a handicapped toilet? {YES/NO:21197}  TIMED UP AND GO:  Was the test performed? Unable to perform, virtual appointment   Cognitive Function:        12/17/2020    5:29 PM  6CIT Screen  What Year? 0 points  What month? 0 points  What time? 0 points  Count back from 20 0 points  Months in reverse 0 points  Repeat phrase 0 points  Total Score 0 points    Immunizations Immunization History  Administered Date(s) Administered   COVID-19, mRNA, vaccine(Comirnaty)12 years and older 03/07/2022   Fluad Quad(high Dose 65+) 03/07/2022   H1N1 09/22/2008   Hepatitis B 09/24/2001, 11/07/2001, 05/26/2002    Influenza Whole 02/20/2006, 03/21/2007, 03/31/2008, 03/08/2009, 01/26/2010, 12/28/2010   Influenza,inj,Quad PF,6+ Mos 04/10/2013, 01/27/2014, 01/21/2015, 01/20/2016, 01/23/2017, 02/07/2018, 01/21/2019, 01/31/2021   Influenza-Unspecified 03/13/2013   Moderna Sars-Covid-2 Vaccination 09/17/2019, 10/15/2019   PFIZER Comirnaty(Gray Top)Covid-19 Tri-Sucrose Vaccine 05/20/2021   Pneumococcal Conjugate-13 01/06/2015   Pneumococcal Polysaccharide-23 09/22/2008, 08/07/2013   Tdap 01/06/2015    TDAP status: Up to date  Flu Vaccine status: Up to date  Pneumococcal vaccine status: Up to date  Covid-19 vaccine status: Information provided on how to obtain vaccines.   Qualifies for Shingles Vaccine? Yes   Zostavax completed No   Shingrix Completed?: No.    Education has been provided regarding the importance of this vaccine. Patient has been advised to call insurance company to determine out of pocket expense if they have not yet received this vaccine. Advised may also receive vaccine at local pharmacy or Health Dept. Verbalized acceptance and understanding.  Screening Tests Health Maintenance  Topic Date Due   Zoster Vaccines- Shingrix (1 of 2) Never done   COLONOSCOPY (Pts 45-40yr Insurance coverage will need to be confirmed)  08/03/2020   Medicare Annual Wellness (AWV)  12/17/2021   COVID-19 Vaccine (5 - 2023-24 season) 05/02/2022   DTaP/Tdap/Td (2 - Td or Tdap) 01/05/2025   Pneumonia Vaccine 75 Years old  Completed   INFLUENZA VACCINE  Completed   Hepatitis C Screening  Completed   HPV VACCINES  Aged Out    Health Maintenance  Health Maintenance Due  Topic Date Due   Zoster Vaccines- Shingrix (1 of 2) Never done   COLONOSCOPY (Pts 45-486yrInsurance coverage will need to be confirmed)  08/03/2020   Medicare Annual Wellness (AWV)  12/17/2021   COVID-19 Vaccine (5 - 2023-24 season) 05/02/2022    Colorectal screening: Due for a colonoscopy, last exam 08/04/2015, recommended that he  repeat in 5 yrs.  Upper Endoscopy: 07/132023  Lung Cancer Screening: (Low Dose CT Chest recommended if Age 75-80ears, 30 pack-year currently smoking OR have quit w/in 15years.) does not qualify.   Lung Cancer Screening Referral: not applicable   Additional Screening:  Hepatitis C Screening: does qualify; Completed: 07/02/2006  Vision Screening: Recommended annual ophthalmology exams for early detection of glaucoma and other disorders of the eye. Is the patient up to date with their annual eye exam?  Yes  Who is the provider or what is the name of the office in which the patient attends annual eye exams? *** If pt is not established with a provider, would they like to be referred to a provider to establish care? No .   Dental Screening: Recommended  annual dental exams for proper oral hygiene  Community Resource Referral / Chronic Care Management: CRR required this visit?  No   CCM required this visit?  No      Plan:     I have personally reviewed and noted the following in the patient's chart:   Medical and social history Use of alcohol, tobacco or illicit drugs  Current medications and supplements including opioid prescriptions. Patient is not currently taking opioid prescriptions. Functional ability and status Nutritional status Physical activity Advanced directives List of other physicians Hospitalizations, surgeries, and ER visits in previous 12 months Vitals Screenings to include cognitive, depression, and falls Referrals and appointments  In addition, I have reviewed and discussed with patient certain preventive protocols, quality metrics, and best practice recommendations. A written personalized care plan for preventive services as well as general preventive health recommendations were provided to patient.    Mr. Tuchman , Thank you for taking time to come for your Medicare Wellness Visit. I appreciate your ongoing commitment to your health goals. Please review the  following plan we discussed and let me know if I can assist you in the future.   These are the goals we discussed:  Goals       Healthy Nutrition Achieved (pt-stated)      Notes: Goals for the next year:   Patient wants to stay as healthy as he is or even get better. Patient states that his breathing has been different since having COVID. Not able to walk without dyspnea. It has changed his ability to complete tasks and hobbies like yard work. Patient is still motivated to do as much as he can to maintain his current health status.         This is a list of the screening recommended for you and due dates:  Health Maintenance  Topic Date Due   Zoster (Shingles) Vaccine (1 of 2) Never done   Colon Cancer Screening  08/03/2020   COVID-19 Vaccine (5 - 2023-24 season) 05/02/2022   Medicare Annual Wellness Visit  06/21/2023   DTaP/Tdap/Td vaccine (2 - Td or Tdap) 01/05/2025   Pneumonia Vaccine  Completed   Flu Shot  Completed   Hepatitis C Screening: USPSTF Recommendation to screen - Ages 18-79 yo.  Completed   HPV Vaccine  Aged [address], [place]   06/20/2022  Nurse Notes: Approximately 30 minute Non-Face -To-Face Medicare Wellness Visit

## 2022-06-19 NOTE — Patient Instructions (Signed)
Health Maintenance, Male Adopting a healthy lifestyle and getting preventive care are important in promoting health and wellness. Ask your health care provider about: The right schedule for you to have regular tests and exams. Things you can do on your own to prevent diseases and keep yourself healthy. What should I know about diet, weight, and exercise? Eat a healthy diet  Eat a diet that includes plenty of vegetables, fruits, low-fat dairy products, and lean protein. Do not eat a lot of foods that are high in solid fats, added sugars, or sodium. Maintain a healthy weight Body mass index (BMI) is a measurement that can be used to identify possible weight problems. It estimates body fat based on height and weight. Your health care provider can help determine your BMI and help you achieve or maintain a healthy weight. Get regular exercise Get regular exercise. This is one of the most important things you can do for your health. Most adults should: Exercise for at least 150 minutes each week. The exercise should increase your heart rate and make you sweat (moderate-intensity exercise). Do strengthening exercises at least twice a week. This is in addition to the moderate-intensity exercise. Spend less time sitting. Even light physical activity can be beneficial. Watch cholesterol and blood lipids Have your blood tested for lipids and cholesterol at 75 years of age, then have this test every 5 years. You may need to have your cholesterol levels checked more often if: Your lipid or cholesterol levels are high. You are older than 75 years of age. You are at high risk for heart disease. What should I know about cancer screening? Many types of cancers can be detected early and may often be prevented. Depending on your health history and family history, you may need to have cancer screening at various ages. This may include screening for: Colorectal cancer. Prostate cancer. Skin cancer. Lung  cancer. What should I know about heart disease, diabetes, and high blood pressure? Blood pressure and heart disease High blood pressure causes heart disease and increases the risk of stroke. This is more likely to develop in people who have high blood pressure readings or are overweight. Talk with your health care provider about your target blood pressure readings. Have your blood pressure checked: Every 3-5 years if you are 18-39 years of age. Every year if you are 40 years old or older. If you are between the ages of 65 and 75 and are a current or former smoker, ask your health care provider if you should have a one-time screening for abdominal aortic aneurysm (AAA). Diabetes Have regular diabetes screenings. This checks your fasting blood sugar level. Have the screening done: Once every three years after age 45 if you are at a normal weight and have a low risk for diabetes. More often and at a younger age if you are overweight or have a high risk for diabetes. What should I know about preventing infection? Hepatitis B If you have a higher risk for hepatitis B, you should be screened for this virus. Talk with your health care provider to find out if you are at risk for hepatitis B infection. Hepatitis C Blood testing is recommended for: Everyone born from 1945 through 1965. Anyone with known risk factors for hepatitis C. Sexually transmitted infections (STIs) You should be screened each year for STIs, including gonorrhea and chlamydia, if: You are sexually active and are younger than 75 years of age. You are older than 75 years of age and your   health care provider tells you that you are at risk for this type of infection. Your sexual activity has changed since you were last screened, and you are at increased risk for chlamydia or gonorrhea. Ask your health care provider if you are at risk. Ask your health care provider about whether you are at high risk for HIV. Your health care provider  may recommend a prescription medicine to help prevent HIV infection. If you choose to take medicine to prevent HIV, you should first get tested for HIV. You should then be tested every 3 months for as long as you are taking the medicine. Follow these instructions at home: Alcohol use Do not drink alcohol if your health care provider tells you not to drink. If you drink alcohol: Limit how much you have to 0-2 drinks a day. Know how much alcohol is in your drink. In the [place] one drink equals one 12 oz bottle of beer (355 mL), one 5 oz glass of wine (148 mL), or one 1 oz glass of hard liquor (44 mL). Lifestyle Do not use any products that contain nicotine or tobacco. These products include cigarettes, chewing tobacco, and vaping devices, such as e-cigarettes. If you need help quitting, ask your health care provider. Do not use street drugs. Do not share needles. Ask your health care provider for help if you need support or information about quitting drugs. General instructions Schedule regular health, dental, and eye exams. Stay current with your vaccines. Tell your health care provider if: You often feel depressed. You have ever been abused or do not feel safe at home. Summary Adopting a healthy lifestyle and getting preventive care are important in promoting health and wellness. Follow your health care provider's instructions about healthy diet, exercising, and getting tested or screened for diseases. Follow your health care provider's instructions on monitoring your cholesterol and blood pressure. This information is not intended to replace advice given to you by your health care provider. Make sure you discuss any questions you have with your health care provider. Document Revised: 09/13/2020 Document Reviewed: 09/13/2020 Elsevier Patient Education  2023 [employer]  

## 2022-06-20 ENCOUNTER — Ambulatory Visit (INDEPENDENT_AMBULATORY_CARE_PROVIDER_SITE_OTHER): Payer: Medicare HMO

## 2022-06-20 DIAGNOSIS — Z0000 Encounter for general adult medical examination without abnormal findings: Secondary | ICD-10-CM

## 2022-06-26 ENCOUNTER — Telehealth: Payer: Self-pay | Admitting: Student

## 2022-06-26 NOTE — Telephone Encounter (Signed)
[place] to schedule their annual wellness visit. Appointment made for 07/03/2022 at [place]

## 2022-06-29 ENCOUNTER — Other Ambulatory Visit: Payer: Self-pay | Admitting: Emergency Medicine

## 2022-07-02 NOTE — Patient Instructions (Signed)
Health Maintenance, Male Adopting a healthy lifestyle and getting preventive care are important in promoting health and wellness. Ask your health care provider about: The right schedule for you to have regular tests and exams. Things you can do on your own to prevent diseases and keep yourself healthy. What should I know about diet, weight, and exercise? Eat a healthy diet  Eat a diet that includes plenty of vegetables, fruits, low-fat dairy products, and lean protein. Do not eat a lot of foods that are high in solid fats, added sugars, or sodium. Maintain a healthy weight Body mass index (BMI) is a measurement that can be used to identify possible weight problems. It estimates body fat based on height and weight. Your health care provider can help determine your BMI and help you achieve or maintain a healthy weight. Get regular exercise Get regular exercise. This is one of the most important things you can do for your health. Most adults should: Exercise for at least 150 minutes each week. The exercise should increase your heart rate and make you sweat (moderate-intensity exercise). Do strengthening exercises at least twice a week. This is in addition to the moderate-intensity exercise. Spend less time sitting. Even light physical activity can be beneficial. Watch cholesterol and blood lipids Have your blood tested for lipids and cholesterol at 75 years of age, then have this test every 5 years. You may need to have your cholesterol levels checked more often if: Your lipid or cholesterol levels are high. You are older than 75 years of age. You are at high risk for heart disease. What should I know about cancer screening? Many types of cancers can be detected early and may often be prevented. Depending on your health history and family history, you may need to have cancer screening at various ages. This may include screening for: Colorectal cancer. Prostate cancer. Skin cancer. Lung  cancer. What should I know about heart disease, diabetes, and high blood pressure? Blood pressure and heart disease High blood pressure causes heart disease and increases the risk of stroke. This is more likely to develop in people who have high blood pressure readings or are overweight. Talk with your health care provider about your target blood pressure readings. Have your blood pressure checked: Every 3-5 years if you are 18-39 years of age. Every year if you are 40 years old or older. If you are between the ages of 65 and 75 and are a current or former smoker, ask your health care provider if you should have a one-time screening for abdominal aortic aneurysm (AAA). Diabetes Have regular diabetes screenings. This checks your fasting blood sugar level. Have the screening done: Once every three years after age 45 if you are at a normal weight and have a low risk for diabetes. More often and at a younger age if you are overweight or have a high risk for diabetes. What should I know about preventing infection? Hepatitis B If you have a higher risk for hepatitis B, you should be screened for this virus. Talk with your health care provider to find out if you are at risk for hepatitis B infection. Hepatitis C Blood testing is recommended for: Everyone born from 1945 through 1965. Anyone with known risk factors for hepatitis C. Sexually transmitted infections (STIs) You should be screened each year for STIs, including gonorrhea and chlamydia, if: You are sexually active and are younger than 75 years of age. You are older than 75 years of age and your   health care provider tells you that you are at risk for this type of infection. Your sexual activity has changed since you were last screened, and you are at increased risk for chlamydia or gonorrhea. Ask your health care provider if you are at risk. Ask your health care provider about whether you are at high risk for HIV. Your health care provider  may recommend a prescription medicine to help prevent HIV infection. If you choose to take medicine to prevent HIV, you should first get tested for HIV. You should then be tested every 3 months for as long as you are taking the medicine. Follow these instructions at home: Alcohol use Do not drink alcohol if your health care provider tells you not to drink. If you drink alcohol: Limit how much you have to 0-2 drinks a day. Know how much alcohol is in your drink. In the [place] one drink equals one 12 oz bottle of beer (355 mL), one 5 oz glass of wine (148 mL), or one 1 oz glass of hard liquor (44 mL). Lifestyle Do not use any products that contain nicotine or tobacco. These products include cigarettes, chewing tobacco, and vaping devices, such as e-cigarettes. If you need help quitting, ask your health care provider. Do not use street drugs. Do not share needles. Ask your health care provider for help if you need support or information about quitting drugs. General instructions Schedule regular health, dental, and eye exams. Stay current with your vaccines. Tell your health care provider if: You often feel depressed. You have ever been abused or do not feel safe at home. Summary Adopting a healthy lifestyle and getting preventive care are important in promoting health and wellness. Follow your health care provider's instructions about healthy diet, exercising, and getting tested or screened for diseases. Follow your health care provider's instructions on monitoring your cholesterol and blood pressure. This information is not intended to replace advice given to you by your health care provider. Make sure you discuss any questions you have with your health care provider. Document Revised: 09/13/2020 Document Reviewed: 09/13/2020 Elsevier Patient Education  2023 [employer]  

## 2022-07-02 NOTE — Progress Notes (Unsigned)
I connected with  [name] on 07/03/2022 by a audio enabled telemedicine application and verified that I am speaking with the correct person using two identifiers.  Patient Location: Home  Provider Location: Home Office  I discussed the limitations of evaluation and management by telemedicine. The patient expressed understanding and agreed to proceed.  Subjective:   Jeffrey [name] is a 77 y.o. male who presents for Medicare Annual/Subsequent preventive examination.  Review of Systems     Per HPI unless specifically indicated below.  Cardiac Risk Factors include: advanced age (>42mn, >>79women);male gender, Essential Hypertension, CAD, and Hyperlipidemia.           Objective:       05/02/2022    1:05 PM 04/28/2022    1:37 PM 03/07/2022    2:02 PM  Vitals with BMI  Height '5\' 10"'$     Weight 170 lbs 2 oz    BMI [id number]   Systolic [id number] Diastolic 94 87 92  Pulse 92 86 65    Today's Vitals   07/03/22 0942  PainSc: 7    There is no height or weight on file to calculate BMI.     07/03/2022    9:47 AM 09/07/2021    3:33 PM 04/06/2021    2:51 PM 02/28/2021    9:24 AM 12/17/2020    5:32 PM 11/22/2020    3:54 PM 09/21/2020    9:28 AM  Advanced Directives  Does Patient Have a Medical Advance Directive? Yes No No No Yes No No  Type of AParamedicof ASidneyLiving will    [place]   Does patient want to make changes to medical advance directive?     Yes (MAU/Ambulatory/Procedural Areas - Information given)    [place]? No - copy requested    No - copy requested    Would patient like information on creating a medical advance directive? No - Patient declined No - Patient declined No - Patient declined No - Patient declined No - Patient declined No - Patient declined No - Patient declined    Current Medications (verified) Outpatient Encounter Medications as of 07/03/2022  Medication Sig    abacavir-dolutegravir-lamiVUDine (TRIUMEQ) 600-50-300 MG tablet Take 1 tablet by mouth daily.   albuterol (VENTOLIN HFA) 108 (90 Base) MCG/ACT inhaler Inhale 2 puffs into the lungs every 6 (six) hours as needed for wheezing or shortness of breath.   Ascorbic Acid (VITAMIN C) 1000 MG tablet Take 1,000 mg by mouth daily at 6 PM.   aspirin EC 81 MG tablet Take 1 tablet (81 mg total) by mouth daily.   Cholecalciferol (VITAMIN D3) 125 MCG (5000 UT) TABS Take 5,000 Units by mouth daily at 6 PM.   clopidogrel (PLAVIX) 75 MG tablet Take 1 tablet (75 mg total) by mouth daily.   finasteride (PROSCAR) 5 MG tablet Take 5 mg by mouth in the morning and at bedtime.   losartan (COZAAR) 50 MG tablet Take 50 mg by mouth daily. Started by his kidney doctor- Dr. [facility]   pantoprazole (PROTONIX) 20 MG tablet Take 1 tablet (20 mg total) by mouth 2 (two) times daily.   rosuvastatin (CRESTOR) 20 MG tablet Take 1 tablet by mouth once daily   solifenacin (VESICARE) 10 MG tablet Take by mouth.   Tiotropium Bromide-Olodaterol (STIOLTO RESPIMAT) 2.5-2.5 MCG/ACT AERS INHALE 2 PUFFS BY MOUTH ONCE DAILY  vitamin B-12 (CYANOCOBALAMIN) 500 MCG tablet Take 500 mcg by mouth daily.   Zinc 50 MG TABS Take 50 mg by mouth daily at 6 PM.   [DISCONTINUED] dextromethorphan-guaiFENesin (MUCINEX DM) 30-600 MG 12hr tablet Take 1 tablet by mouth 2 (two) times daily. (Patient not taking: Reported on 07/03/2022)   [DISCONTINUED] predniSONE (DELTASONE) 10 MG tablet Take 1 tablet (10 mg total) by mouth in the morning, at noon, and at bedtime. (Patient not taking: Reported on 07/03/2022)   No facility-administered encounter medications on file as of 07/03/2022.    Allergies (verified) Baclofen, Efavirenz, and Nevirapine   History: Past Medical History:  Diagnosis Date   Chronic kidney disease    Colon polyps    COVID-19    05/2019   GERD (gastroesophageal reflux disease)    Heart murmur    History of kidney stones     HIV (human immunodeficiency virus infection) ([place]) 1999   Hypertension    Pneumonia    Stroke ([place])    right side weakness   TIA (transient ischemic attack)    Past Surgical History:  Procedure Laterality Date   CHOLECYSTECTOMY  1982   COLONOSCOPY     ESOPHAGEAL MANOMETRY N/A 09/03/2017   Procedure: ESOPHAGEAL MANOMETRY (EM);  Surgeon: Mauri Pole, MD;  Location: [facility];  Service: Endoscopy;  Laterality: N/A;   IR ANGIO INTRA EXTRACRAN SEL COM CAROTID INNOMINATE BILAT MOD SED  04/21/2019   IR ANGIO INTRA EXTRACRAN SEL COM CAROTID INNOMINATE BILAT MOD SED  05/11/2020   IR ANGIO VERTEBRAL SEL VERTEBRAL BILAT MOD SED  04/21/2019   IR ANGIO VERTEBRAL SEL VERTEBRAL BILAT MOD SED  05/11/2020   IR PTA INTRACRANIAL  05/14/2019   IR US GUIDE VASC ACCESS RIGHT  05/11/2020   KNEE SURGERY     LEFT KNEE   RADIOLOGY WITH ANESTHESIA N/A 05/14/2019   Procedure: IR WITH ANESTHESIA STENTING;  Surgeon: Luanne Bras, MD;  Location: [facility];  Service: Radiology;  Laterality: N/A;   TONSILLECTOMY     Family History  Problem Relation Age of Onset   Hypertension Mother    Heart attack Mother 76   Alzheimer's disease Sister    Heart disease Sister    Alzheimer's disease Sister    Drug abuse Sister    COPD Brother    Kidney cancer Brother    Colon cancer Neg Hx    Esophageal cancer Neg Hx    Pancreatic cancer Neg Hx    Stomach cancer Neg Hx    Liver disease Neg Hx    Social History   Socioeconomic History   Marital status: Divorced    Spouse name: Not on file   Number of children: 2   Years of education: 14   Highest education level: Some college, no degree  Occupational History   Occupation: retired    Fish farm manager: [employer]  Tobacco Use   Smoking status: Never   Smokeless tobacco: Former    Types: Chew    Quit date: 2019  Vaping Use   Vaping Use: Never used  Substance and Sexual Activity   Alcohol use: Yes    Comment: rare - few per year   Drug use: No   Sexual activity:  Not Currently    Partners: Female    Comment: declined condoms  Other Topics Concern   Not on file  Social History Narrative   Lives alone   Right handed    Caffeine use: tea, mainly water  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, environmental   2 children - 1 boy and 1 girl   Social Determinants of Health   Financial Resource Strain: Medium Risk (07/03/2022)   Overall Financial Resource Strain (CARDIA)    Difficulty of Paying Living Expenses: Somewhat hard  Food Insecurity: No Food Insecurity (07/03/2022)   Hunger Vital Sign    Worried About Running Out of Food in the Last Year: Never true    [place] in the Last Year: Never true  Transportation Needs: No Transportation Needs (07/03/2022)   PRAPARE - Hydrologist (Medical): No    Lack of Transportation (Non-Medical): No  Physical Activity: Sufficiently Active (07/03/2022)   Exercise Vital Sign    Days of Exercise per Week: 7 days    Minutes of Exercise per Session: 60 min  Stress: No Stress Concern Present (07/03/2022)   [place]    Feeling of Stress : Not at all  Social Connections: Socially Isolated (07/03/2022)   Social Connection and Isolation Panel [NHANES]    Frequency of Communication with Friends and Family: More than three times a week    Frequency of Social Gatherings with Friends and Family: Once a week    Attends Religious Services: Never    Printmaker: Patient refused    Attends Archivist Meetings: Never    Marital Status: Divorced    Tobacco Counseling Counseling given: No   Clinical Intake:  Pre-visit preparation completed: No  Pain : 0-10 Pain Score: 7  Pain Type: Chronic pain Pain Location: Neck Pain Orientation: Right Pain Descriptors / Indicators: Aching Pain Onset: More than a month ago Pain Frequency: Occasional     Nutritional Risks: None Diabetes: No  How  often do you need to have someone help you when you read instructions, pamphlets, or other written materials from your doctor or pharmacy?: 1 - Never  Diabetic?No  Interpreter Needed?: No  Information entered by :: Donnie Mesa, CMA   Activities of Daily Living    07/03/2022    9:37 AM  In your present state of health, do you have any difficulty performing the following activities:  Hearing? 0  Vision? 1  Difficulty concentrating or making decisions? 1  Walking or climbing stairs? 1  Dressing or bathing? 0  Doing errands, shopping? 0    Patient Care Team: Gerrit Heck, MD as PCP - General (Family Medicine) [name], MD as PCP - Infectious Diseases (Infectious Diseases) Josue Hector, MD as PCP - Cardiology (Cardiology)  Indicate any recent Medical Services you may have received from other than [facility] providers in the past year (date may be approximate).     Assessment:   This is a routine wellness examination for [place].   Hearing/Vision screen Denies any hearing issues. Admits to some vision changes. He is going to call and schedule an Eye Exam. Last Eye Exam was 2 yrs ago.   Dietary issues and exercise activities discussed: Current Exercise Habits: Structured exercise class, Type of exercise: walking;Other - see comments, Time (Minutes): 60, Frequency (Times/Week): 7, Weekly Exercise (Minutes/Week): 420, Intensity: Moderate, Exercise limited by: None identified   Goals Addressed   None    Depression Screen    07/03/2022    9:36 AM 03/07/2022    1:56 PM 09/07/2021    3:32 PM 04/06/2021    2:52 PM 02/28/2021    9:25 AM  12/17/2020    5:15 PM 11/22/2020    3:54 PM  PHQ 2/9 Scores  PHQ - 2 Score 0 0 0 0 0 0 0  PHQ- 9 Score   '4 2 3 '$ 0 2    Fall Risk    07/03/2022    9:40 AM 07/03/2022    9:36 AM 03/07/2022    1:56 PM 04/06/2021    2:50 PM 03/03/2021    9:48 AM  Fall Risk   Falls in the past year? 0 0 0 0 0  Number falls in past yr: 0 0 0 0 0   Injury with Fall? 0  0 0 0  Risk for fall due to : No Fall Risks No Fall Risks     Follow up Falls evaluation completed Falls evaluation completed       FALL RISK PREVENTION PERTAINING TO THE HOME:  Any stairs in or around the home? No  If so, are there any without handrails? No  Home free of loose throw rugs in walkways, pet beds, electrical cords, etc? Yes  Adequate lighting in your home to reduce risk of falls? Yes   ASSISTIVE DEVICES UTILIZED TO PREVENT FALLS:  Life alert? No  Use of a cane, walker or w/c? No  Grab bars in the bathroom? No  Shower chair or bench in shower? No  Elevated toilet seat or a handicapped toilet? No   TIMED UP AND GO:  Was the test performed?Unable to perform, virtual appointment   Cognitive Function:        07/03/2022    9:41 AM 12/17/2020    5:29 PM  6CIT Screen  What Year? 0 points 0 points  What month? 0 points 0 points  What time? 0 points 0 points  Count back from 20 2 points 0 points  Months in reverse 0 points 0 points  Repeat phrase 0 points 0 points  Total Score 2 points 0 points    Immunizations Immunization History  Administered Date(s) Administered   COVID-19, mRNA, vaccine(Comirnaty)12 years and older 03/07/2022   Fluad Quad(high Dose 65+) 03/07/2022   H1N1 09/22/2008   Hepatitis B 09/24/2001, 11/07/2001, 05/26/2002   Influenza Whole 02/20/2006, 03/21/2007, 03/31/2008, 03/08/2009, 01/26/2010, 12/28/2010   Influenza,inj,Quad PF,6+ Mos 04/10/2013, 01/27/2014, 01/21/2015, 01/20/2016, 01/23/2017, 02/07/2018, 01/21/2019, 01/31/2021   Influenza-Unspecified 03/13/2013   Moderna Sars-Covid-2 Vaccination 09/17/2019, 10/15/2019   PFIZER Comirnaty(Gray Top)Covid-19 Tri-Sucrose Vaccine 05/20/2021   Pneumococcal Conjugate-13 01/06/2015   Pneumococcal Polysaccharide-23 09/22/2008, 08/07/2013   Tdap 01/06/2015    TDAP status: Up to date  Flu Vaccine status: Up to date  Pneumococcal vaccine status: Up to date  Covid-19 vaccine  status: Information provided on how to obtain vaccines.   Qualifies for Shingles Vaccine? Yes   Zostavax completed No   Shingrix Completed?: No.    Education has been provided regarding the importance of this vaccine. Patient has been advised to call insurance company to determine out of pocket expense if they have not yet received this vaccine. Advised may also receive vaccine at local pharmacy or Health Dept. Verbalized acceptance and understanding.  Screening Tests Health Maintenance  Topic Date Due   Zoster Vaccines- Shingrix (1 of 2) Never done   COLONOSCOPY (Pts 45-22yr Insurance coverage will need to be confirmed)  08/03/2020   COVID-19 Vaccine (5 - 2023-24 season) 05/02/2022   Medicare Annual Wellness (AWV)  07/04/2023   DTaP/Tdap/Td (2 - Td or Tdap) 01/05/2025   Pneumonia Vaccine 75 Years old  Completed   INFLUENZA  VACCINE  Completed   Hepatitis C Screening  Completed   HPV VACCINES  Aged Out    Health Maintenance  Health Maintenance Due  Topic Date Due   Zoster Vaccines- Shingrix (1 of 2) Never done   COLONOSCOPY (Pts 45-24yr Insurance coverage will need to be confirmed)  08/03/2020   COVID-19 Vaccine (5 - 2023-24 season) 05/02/2022    Colorectal cancer screening: Referral to GI placed 12/18/2020 [facility] . Pt aware the office will call re: appt.  Lung Cancer Screening: (Low Dose CT Chest recommended if Age 928-80years, 30 pack-year currently smoking OR have quit w/in 15years.) does not qualify.   Lung Cancer Screening Referral: not applicable   Additional Screening:  Hepatitis C Screening: does qualify; Completed 07/02/2006  Vision Screening: Recommended annual ophthalmology exams for early detection of glaucoma and other disorders of the eye. Is the patient up to date with their annual eye exam?  No  Who is the provider or what is the name of the office in which the patient attends annual eye exams?   If pt is not established with a provider, would they  like to be referred to a provider to establish care? No .   Dental Screening: Recommended annual dental exams for proper oral hygiene  Community Resource Referral / Chronic Care Management: CRR required this visit?  No   CCM required this visit?  No      Plan:     I have personally reviewed and noted the following in the patient's chart:   Medical and social history Use of alcohol, tobacco or illicit drugs  Current medications and supplements including opioid prescriptions. Patient is not currently taking opioid prescriptions. Functional ability and status Nutritional status Physical activity Advanced directives List of other physicians Hospitalizations, surgeries, and ER visits in previous 12 months Vitals Screenings to include cognitive, depression, and falls Referrals and appointments  In addition, I have reviewed and discussed with patient certain preventive protocols, quality metrics, and best practice recommendations. A written personalized care plan for preventive services as well as general preventive health recommendations were provided to patient.    Mr. CMotts, Thank you for taking time to come for your Medicare Wellness Visit. I appreciate your ongoing commitment to your health goals. Please review the following plan we discussed and let me know if I can assist you in the future.   These are the goals we discussed:  Goals       Healthy Nutrition Achieved (pt-stated)      Notes: Goals for the next year:   Patient wants to stay as healthy as he is or even get better. Patient states that his breathing has been different since having COVID. Not able to walk without dyspnea. It has changed his ability to complete tasks and hobbies like yard work. Patient is still motivated to do as much as he can to maintain his current health status.         This is a list of the screening recommended for you and due dates:  Health Maintenance  Topic Date Due   Zoster  (Shingles) Vaccine (1 of 2) Never done   Colon Cancer Screening  08/03/2020   COVID-19 Vaccine (5 - 2023-24 season) 05/02/2022   Medicare Annual Wellness Visit  07/04/2023   DTaP/Tdap/Td vaccine (2 - Td or Tdap) 01/05/2025   Pneumonia Vaccine  Completed   Flu Shot  Completed   Hepatitis C Screening: USPSTF Recommendation to screen - Ages 18-79  yo.  Completed   HPV Vaccine  Aged [address], [place]   07/03/2022   Nurse Notes: Approximately 30 minute Non-Face -To-Face Medicare Wellness Visit

## 2022-07-03 ENCOUNTER — Ambulatory Visit (INDEPENDENT_AMBULATORY_CARE_PROVIDER_SITE_OTHER): Payer: Medicare HMO

## 2022-07-03 DIAGNOSIS — Z0000 Encounter for general adult medical examination without abnormal findings: Secondary | ICD-10-CM

## 2022-07-06 ENCOUNTER — Encounter: Payer: Self-pay | Admitting: Gastroenterology

## 2022-07-10 ENCOUNTER — Telehealth: Payer: Self-pay | Admitting: *Deleted

## 2022-07-10 NOTE — Progress Notes (Signed)
  Care Coordination   Note   07/10/2022 Name: Jeffrey [name] MRN: [id number] DOB: [birth year]  Jeffrey [name] is a 77 y.o. year old male who sees Gerrit Heck, MD for primary care. I reached out to [name] by phone today to offer care coordination services.  Mr. Kroeker was given information about Care Coordination services today including:   The Care Coordination services include support from the care team which includes your Nurse Coordinator, Clinical Social Worker, or Pharmacist.  The Care Coordination team is here to help remove barriers to the health concerns and goals most important to you. Care Coordination services are voluntary, and the patient may decline or stop services at any time by request to their care team member.   Care Coordination Consent Status: Patient agreed to services and verbal consent obtained.   Follow up plan:  Telephone appointment with care coordination team member scheduled for:  08/04/22  Encounter Outcome:  Pt. Scheduled  [place]  Direct Dial: [phone]

## 2022-07-30 IMAGING — CT CT ABD-PELV W/ CM
2 of 5 series · 16 of 46 positions shown, 18 images · IV contrast (omnipaque)
Comparison: 09/06/2017

CLINICAL DATA: Acute abdominal pain for several days

EXAM:
CT ABDOMEN AND PELVIS WITH CONTRAST
TECHNIQUE: Multidetector CT imaging of the abdomen and pelvis was performed
using the standard protocol following bolus administration of
intravenous contrast.
CONTRAST:  80mL OMNIPAQUE IOHEXOL 300 MG/ML  SOLN

[Series 3: abd/ pelvis 5.0 i30f 2 · axial · 0.83mm/px · z∈[+955,+1400]mm · 13 of 101 slices shown, 15 images]
[im 6/101  soft-tissue]
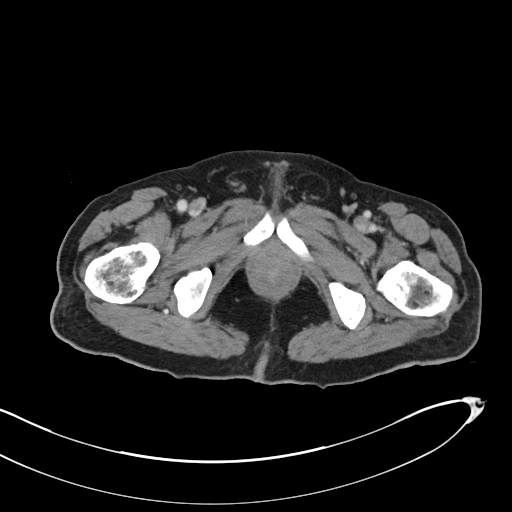
[im 6/101  bone]
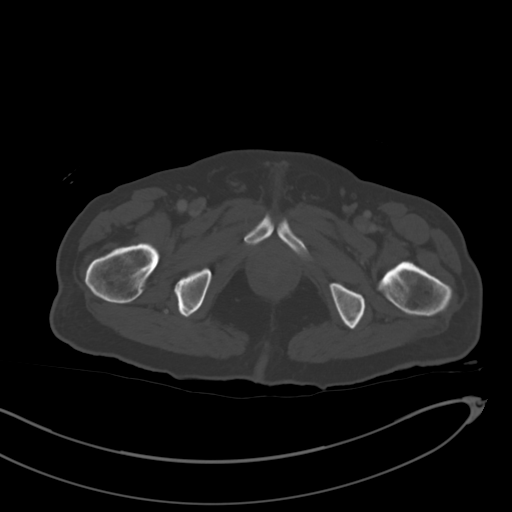
[im 16/101  soft-tissue]
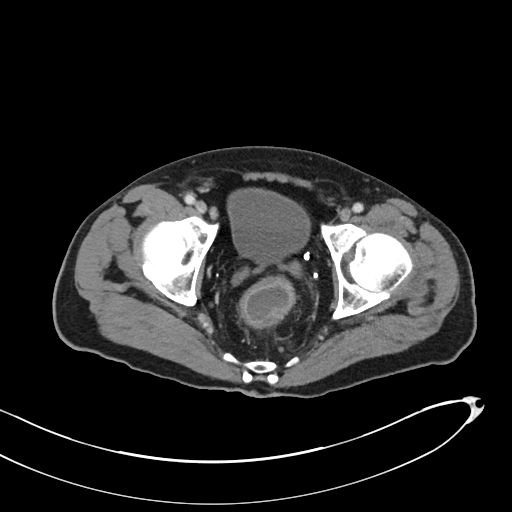
[im 22/101  soft-tissue]
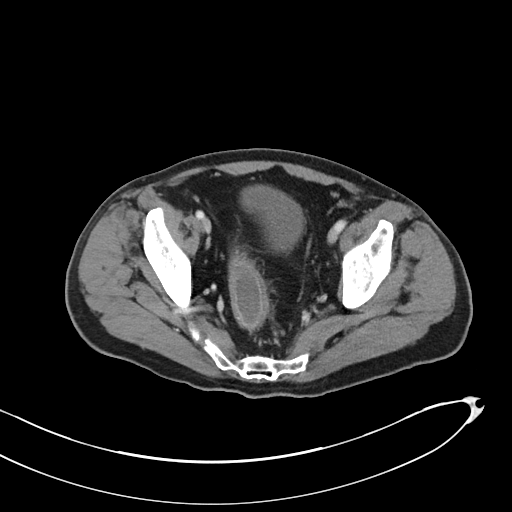
[im 27/101  soft-tissue]
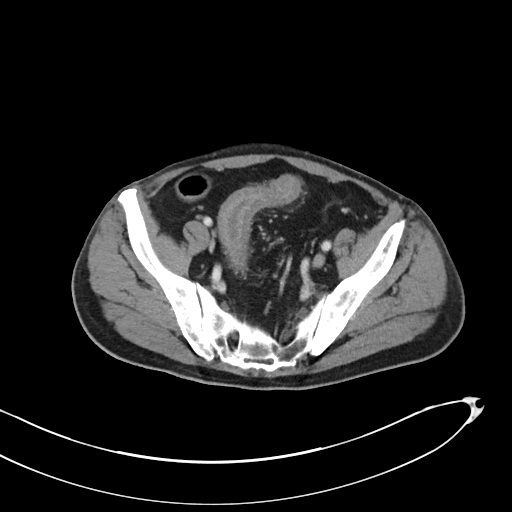
[im 37/101  soft-tissue]
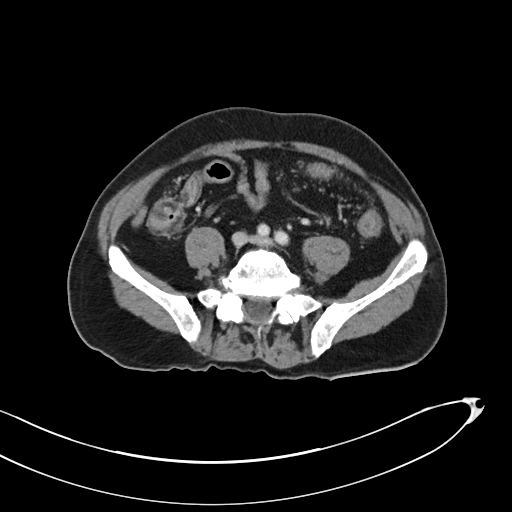
[im 43/101  soft-tissue]
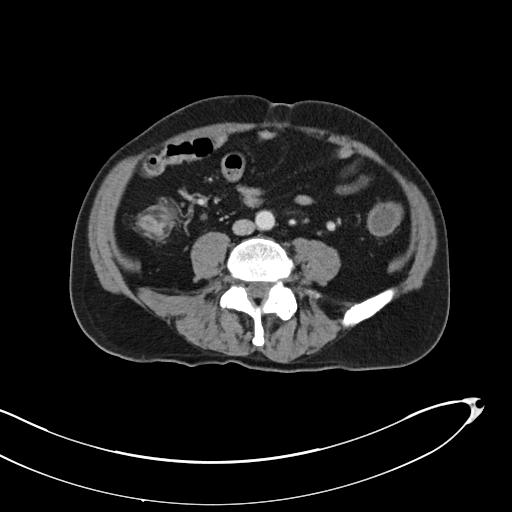
[im 53/101  soft-tissue]
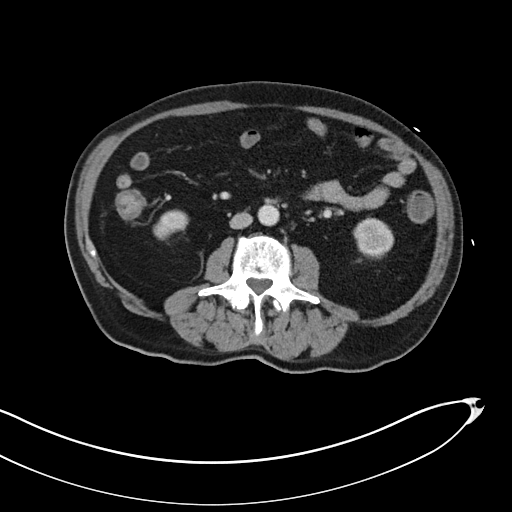
[im 58/101  soft-tissue]
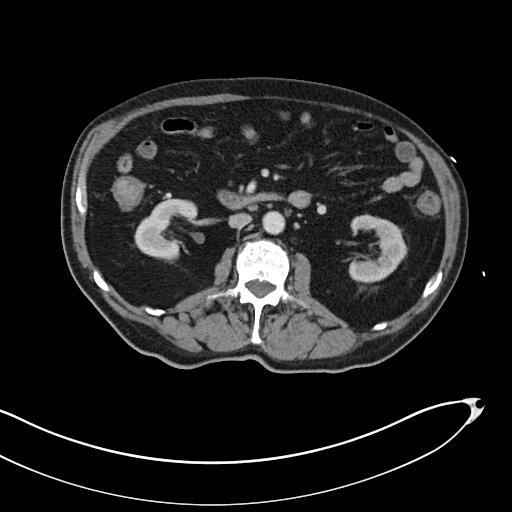
[im 64/101  soft-tissue]
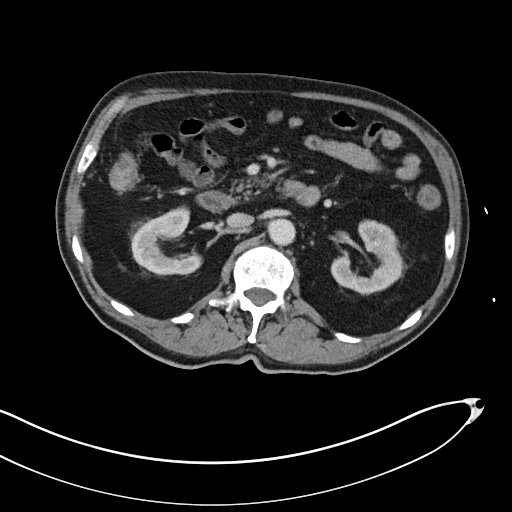
[im 64/101  bone]
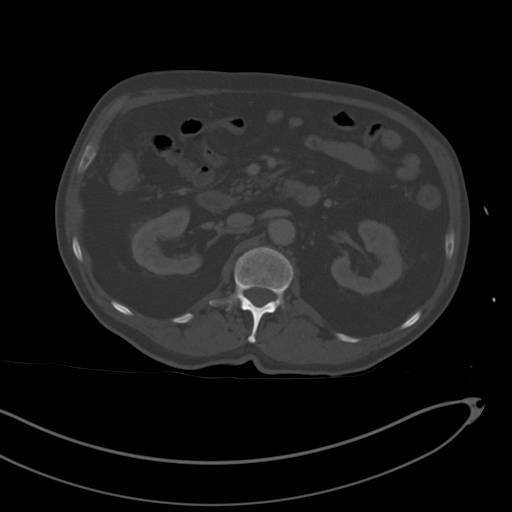
[im 74/101  soft-tissue]
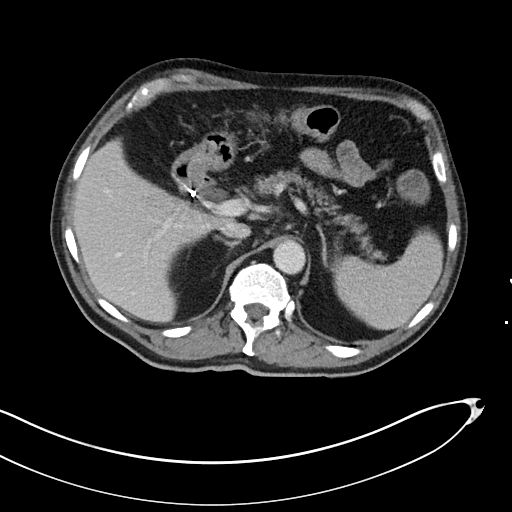
[im 79/101  soft-tissue]
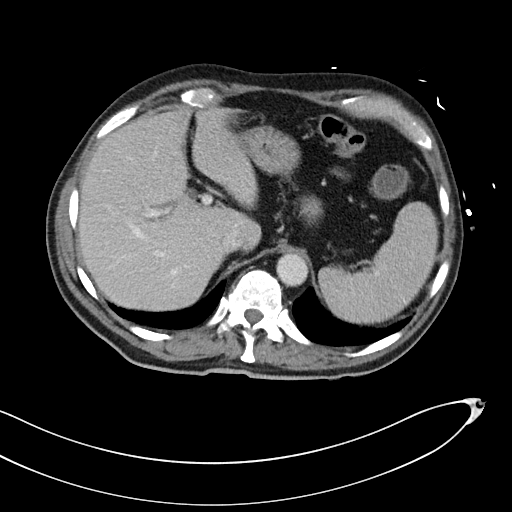
[im 85/101  soft-tissue]
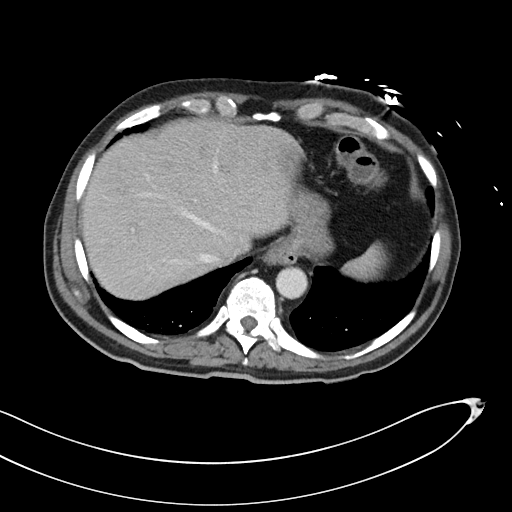
[im 95/101  soft-tissue]
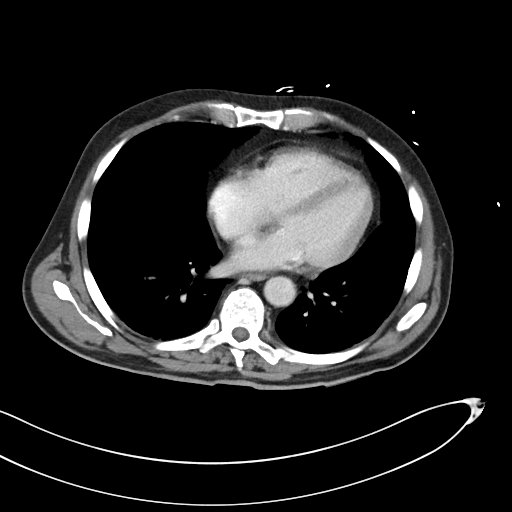

[Series 6: coronal soft tissue · coronal · 0.85mm/px · 3 of 90 slices shown]
[im 30/90  soft-tissue]
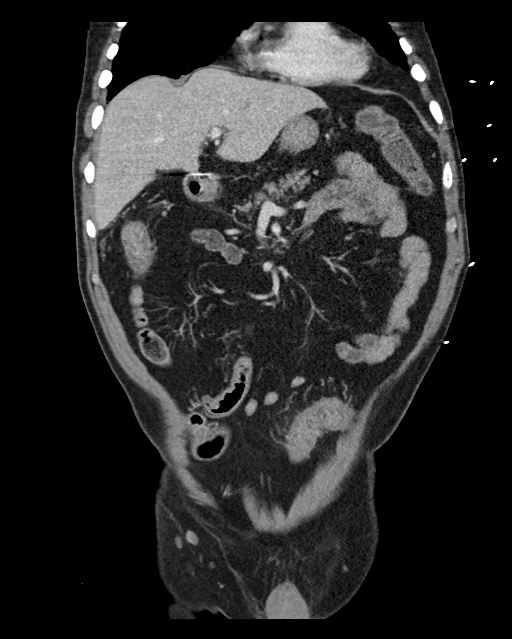
[im 40/90  soft-tissue]
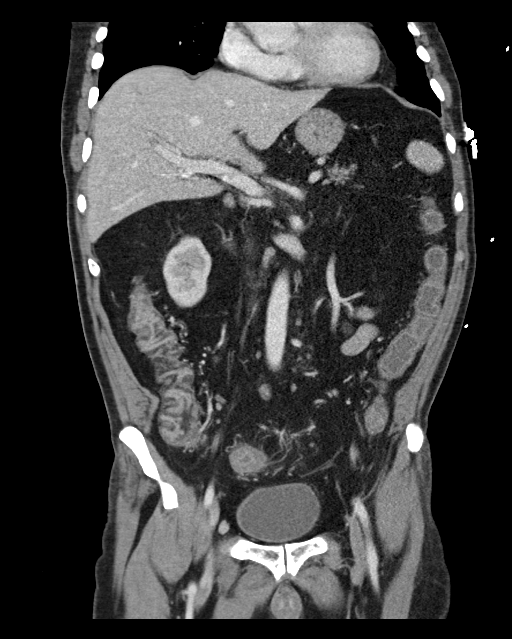
[im 50/90  soft-tissue]
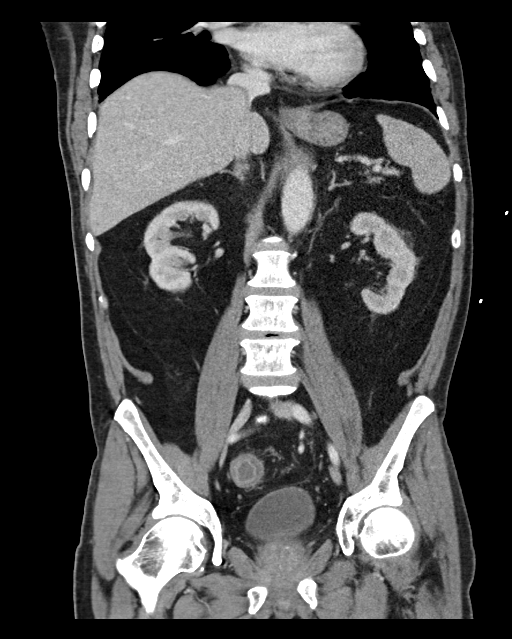

[16 of 46 positions shown; findings below may reference images not displayed]

FINDINGS: Lower chest: Minimal scarring is noted in the bases bilaterally.

Hepatobiliary: Gallbladder has been surgically removed. Mild
prominence of the biliary tree is noted due to the post
cholecystectomy state. The liver is well visualized and within
normal limits.

Pancreas: Unremarkable. No pancreatic ductal dilatation or
surrounding inflammatory changes.

Spleen: Normal in size without focal abnormality.

Adrenals/Urinary Tract: Adrenal glands are within normal limits.
Kidneys demonstrate a normal enhancement pattern. No renal calculi
or obstructive changes are seen. Normal excretion is noted on
delayed images. Bladder is partially distended.

Stomach/Bowel: Wall thickening and edema is noted within the
rectosigmoid and to a lesser degree throughout the remainder of the
colon. These changes are consistent with diffuse colitis and are new
from the prior exam. No perforation or abscess formation is noted.
The appendix is within normal limits. Small bowel and stomach are
unremarkable.

Vascular/Lymphatic: No significant vascular findings are present. No
enlarged abdominal or pelvic lymph nodes.

Reproductive: Prostate is unremarkable.

Other: Fat containing left inguinal hernia is noted.

Musculoskeletal: Degenerative changes of lumbar spine are noted.
IMPRESSION: Changes consistent with diffuse colitis worst in the left colon and
rectosigmoid without evidence of abscess or perforation. This
corresponds with the given clinical history of diarrhea and likely
represents an infectious colitis.

Status post cholecystectomy.

## 2022-08-04 ENCOUNTER — Telehealth: Payer: Self-pay

## 2022-08-04 NOTE — Patient Outreach (Signed)
  Care Coordination   08/04/2022 Name: Jeffrey [name] MRN: [id number] DOB: 03-23-1946   Care Coordination Outreach Attempts:  An unsuccessful telephone outreach was attempted today to offer the patient information about available care coordination services as a benefit of their health plan.   Follow Up Plan:  Additional outreach attempts will be made to offer the patient care coordination information and services.   Encounter Outcome:  Pt. Visit Completed   Care Coordination Interventions:  No, not indicated    Lazaro Arms RN, BSN, [facility]   Phone: [phone]

## 2022-08-08 ENCOUNTER — Ambulatory Visit: Payer: Self-pay

## 2022-08-08 NOTE — Patient Outreach (Signed)
  Care Coordination   Initial Visit Note   08/08/2022 Name: Jeffrey [name] MRN: [id number] DOB: Feb 10, 1946  Jeffrey [name] is a 77 y.o. year old male who sees Gerrit Heck, MD for primary care. I spoke with  Jeffrey [name] by phone today.  What matters to the patients health and wellness today?  The patient has reported that his primary concern at present is his breathing. He is currently receiving treatment from his pulmonologist and is satisfied with the care he is receiving. He has a scheduled follow-up appointment with his doctor on April 18th. We have provided him with information about our program and given him our contact details in case he requires our assistance in the future.     Goals Addressed             This Visit's Progress    COMPLETED: Care Coordination Activities -no follow up required        Care Coordination Interventions: Active listening / Reflection utilized  Emotional Support Provided Problem [place] strategies reviewed Discussed/.Educated Care Coordination Program 2.   Discussed/.Educated Annual Wellness Visit 3.   Discussed/.Educated Social Determinates of Health 4.   Please inform PCP if services needed in the future         SDOH assessments and interventions completed:  Yes  SDOH Interventions Today    Flowsheet Row Most Recent Value  SDOH Interventions   Food Insecurity Interventions Intervention Not Indicated  Transportation Interventions Intervention Not Indicated        Care Coordination Interventions:  Yes, provided   Interventions Today    Flowsheet Row Most Recent Value  General Interventions   General Interventions Discussed/Reviewed General Interventions Reviewed  [facility] management program and resources]       Follow up plan: No further intervention required.   Encounter Outcome:  Pt. Visit Completed   Lazaro Arms RN, BSN, [place] [facility]   Phone: [phone]

## 2022-08-08 NOTE — Patient Instructions (Signed)
Visit Information  Thank you for taking time to visit with me today. Please don't hesitate to contact me if I can be of assistance to you.   Following are the goals we discussed today:   Goals Addressed             This Visit's Progress    COMPLETED: Care Coordination Activities -no follow up required        Care Coordination Interventions: Active listening / Reflection utilized  Emotional Support Provided Problem [place] strategies reviewed Discussed/.Educated Care Coordination Program 2.   Discussed/.Educated Annual Wellness Visit 3.   Discussed/.Educated Social Determinates of Health 4.   Please inform PCP if services needed in the future          If you are experiencing a Mental Health or [place] or need someone to talk to, please call 1-800-273-TALK (toll free, 24 hour hotline)  The patient verbalized understanding of instructions, educational materials, and care plan provided today and agreed to receive a mailed copy of patient instructions, educational materials, and care plan.   No further follow up required:  Lazaro Arms RN, BSN, [facility]   Phone: [phone]

## 2022-08-24 ENCOUNTER — Ambulatory Visit (INDEPENDENT_AMBULATORY_CARE_PROVIDER_SITE_OTHER): Payer: Medicare HMO | Admitting: Emergency Medicine

## 2022-08-24 ENCOUNTER — Encounter: Payer: Self-pay | Admitting: Emergency Medicine

## 2022-08-24 VITALS — BP 128/74 | HR 78 | Temp 97.7°F | Ht 70.0 in | Wt 174.6 lb

## 2022-08-24 DIAGNOSIS — R9389 Abnormal findings on diagnostic imaging of other specified body structures: Secondary | ICD-10-CM | POA: Insufficient documentation

## 2022-08-24 DIAGNOSIS — J449 Chronic obstructive pulmonary disease, unspecified: Secondary | ICD-10-CM

## 2022-08-24 DIAGNOSIS — I509 Heart failure, unspecified: Secondary | ICD-10-CM | POA: Diagnosis not present

## 2022-08-24 DIAGNOSIS — B20 Human immunodeficiency virus [HIV] disease: Secondary | ICD-10-CM | POA: Diagnosis not present

## 2022-08-24 NOTE — Progress Notes (Signed)
Subjective:    Patient ID: Jeffrey [name], male    DOB: 1946/01/10, 77 y.o.   MRN: [id number]  HPI  ROV 08/24/2022 --follow-up visit for 77 year old gentleman, never smoker for evaluation of mild obstructive lung disease and shortness of breath.  His past medical history is also significant for HIV, CKD, hypertension, 2 bouts of COVID-19 in 2021 and then 09/2020.  He has mild obstruction on pulmonary function testing and some evidence for possible tracheobronchomalacia and mild bilateral bronchiectasis on imaging.  At his last visit in October 2023 I started him on Stiolto to see if he would get benefit.  He had to be seen in the emergency department 04/2022 with acute cough and chest congestion.  He was treated with a brief course of prednisone. He is back to baseline - has exertional sob, happens w yardwork. He benefits from albuterol, averages 1x a day. He is sure that he is benefiting from [employer].     Review of Systems As per HPI  Past Medical History:  Diagnosis Date   Chronic kidney disease    Colon polyps    COVID-19    05/2019   GERD (gastroesophageal reflux disease)    Heart murmur    History of kidney stones    HIV (human immunodeficiency virus infection) 1999   Hypertension    Pneumonia    Stroke    right side weakness   TIA (transient ischemic attack)      Family History  Problem Relation Age of Onset   Hypertension Mother    Heart attack Mother 2   Alzheimer's disease Sister    Heart disease Sister    Alzheimer's disease Sister    Drug abuse Sister    COPD Brother    Kidney cancer Brother    Colon cancer Neg Hx    Esophageal cancer Neg Hx    Pancreatic cancer Neg Hx    Stomach cancer Neg Hx    Liver disease Neg Hx      Social History   Socioeconomic History   Marital status: Divorced    Spouse name: Not on file   Number of children: 2   Years of education: 14   Highest education level: Some college, no degree  Occupational History   Occupation:  retired    Associate Professor: [employer]  Tobacco Use   Smoking status: Never   Smokeless tobacco: Former    Types: Chew    Quit date: 2019  Vaping Use   Vaping Use: Never used  Substance and Sexual Activity   Alcohol use: Yes    Comment: rare - few per year   Drug use: No   Sexual activity: Not Currently    Partners: Female    Comment: declined condoms  Other Topics Concern   Not on file  Social History Narrative   Lives alone   Right handed    Caffeine use: tea, mainly water      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, English as a foreign language   2 children - 1 boy and 1 girl   Social Determinants of Health   Financial Resource Strain: Medium Risk (07/03/2022)   Overall Financial Resource Strain (CARDIA)    Difficulty of Paying Living Expenses: Somewhat hard  Food Insecurity: No Food Insecurity (07/03/2022)   Hunger Vital Sign    Worried About Running Out of Food in the Last Year: Never true    Ran Out of Food in the Last Year: Never true  Transportation Needs:  No Transportation Needs (07/03/2022)   PRAPARE - Administrator, Civil Service (Medical): No    Lack of Transportation (Non-Medical): No  Physical Activity: Sufficiently Active (07/03/2022)   Exercise Vital Sign    Days of Exercise per Week: 7 days    Minutes of Exercise per Session: 60 min  Stress: No Stress Concern Present (07/03/2022)   [employer] of Occupational Health - Occupational Stress Questionnaire    Feeling of Stress : Not at all  Social Connections: Socially Isolated (07/03/2022)   Social Connection and Isolation Panel [NHANES]    Frequency of Communication with Friends and Family: More than three times a week    Frequency of Social Gatherings with Friends and Family: Once a week    Attends Religious Services: Never    Database administrator or Organizations: Patient declined    Attends Banker Meetings: Never    Marital Status: Divorced  Catering manager Violence: Not At Risk (07/03/2022)   Humiliation,  Afraid, Rape, and Kick questionnaire    Fear of Current or Ex-Partner: No    Emotionally Abused: No    Physically Abused: No    Sexually Abused: No     Allergies  Allergen Reactions   Baclofen Other (See Comments)    Back pain. Patient said it made him feel crazy    Efavirenz Rash   Nevirapine Rash and Other (See Comments)     Outpatient Medications Prior to Visit  Medication Sig Dispense Refill   abacavir-dolutegravir-lamiVUDine (TRIUMEQ) 600-50-300 MG tablet Take 1 tablet by mouth daily. 30 tablet 11   albuterol (VENTOLIN HFA) 108 (90 Base) MCG/ACT inhaler Inhale 2 puffs into the lungs every 6 (six) hours as needed for wheezing or shortness of breath. 8 g 6   Ascorbic Acid (VITAMIN C) 1000 MG tablet Take 1,000 mg by mouth daily at 6 PM.     aspirin EC 81 MG tablet Take 1 tablet (81 mg total) by mouth daily.     Cholecalciferol (VITAMIN D3) 125 MCG (5000 UT) TABS Take 5,000 Units by mouth daily at 6 PM.     clopidogrel (PLAVIX) 75 MG tablet Take 1 tablet (75 mg total) by mouth daily. 90 tablet 3   finasteride (PROSCAR) 5 MG tablet Take 5 mg by mouth in the morning and at bedtime.     losartan (COZAAR) 50 MG tablet Take 50 mg by mouth daily. Started by his kidney doctor- Dr. Thedore Mins - [facility]     pantoprazole (PROTONIX) 20 MG tablet Take 1 tablet (20 mg total) by mouth 2 (two) times daily. 180 tablet 3   rosuvastatin (CRESTOR) 20 MG tablet Take 1 tablet by mouth once daily 90 tablet 3   solifenacin (VESICARE) 10 MG tablet Take by mouth.     Tiotropium Bromide-Olodaterol (STIOLTO RESPIMAT) 2.5-2.5 MCG/ACT AERS INHALE 2 PUFFS BY MOUTH ONCE DAILY 4 g 5   vitamin B-12 (CYANOCOBALAMIN) 500 MCG tablet Take 500 mcg by mouth daily.     Zinc 50 MG TABS Take 50 mg by mouth daily at 6 PM.     No facility-administered medications prior to visit.         Objective:   Physical Exam  Vitals:   08/24/22 1106  BP: 128/74  Pulse: 78  Temp: 97.7 F (36.5 C)  TempSrc: Oral  SpO2:  94%  Weight: 174 lb 9.6 oz (79.2 kg)  Height:  (1.778 m)   Gen: Pleasant, well-nourished, in no distress,  normal affect  ENT: No lesions,  mouth clear,  oropharynx clear, no postnasal drip  Neck: No JVD, no stridor  Lungs: No use of accessory muscles, no crackles or wheezing on normal respiration, no wheeze on forced expiration  Cardiovascular: RRR, heart sounds normal, no murmur or gallops, no peripheral edema  Musculoskeletal: No deformities, no cyanosis or clubbing  Neuro: alert, awake, non focal  Skin: Warm, no lesions or rash     Assessment & Plan:  COPD (chronic obstructive pulmonary disease) COPD that I suspect is due to fixed asthma and never smoker, possible contribution of his HIV which is well-managed.  He has benefited from the addition of Stiolto.  Uses albuterol about once daily.  Has had 1 flare in the setting of URI.  Vaccines are up-to-date.  Plan to continue same regimen since he is doing well.  We will plan to continue Stiolto 2 puffs once daily. Keep your albuterol available to use 2 puffs if needed for shortness of breath, chest tightness, wheezing. Keep your flu shot, COVID-19 vaccine up-to-date this fall Please call for any increase in respiratory symptoms including coughing, mucus production, shortness of breath. Follow Dr. Delton Coombes in 1 year, sooner if you have any problems.  Abnormal CT of the chest CT scan of the chest from 01/12/2022 showed some mild bilateral bronchiectatic change and peripheral interstitial prominence. Suspect that this represents sequela from his COVID 19 infections.  In absence of constitutional symptoms or any new respiratory symptoms do not believe that this needs any dedicated follow-up.  We would reimage if there is a clinical change.    Levy Pupa, MD, PhD 08/24/2022, 11:30 AM [facility] [phone] or if no answer before 7:00PM call [phone] For any issues after 7:00PM please call eLink  [phone]

## 2022-08-24 NOTE — Assessment & Plan Note (Signed)
COPD that I suspect is due to fixed asthma and never smoker, possible contribution of his HIV which is well-managed.  He has benefited from the addition of Stiolto.  Uses albuterol about once daily.  Has had 1 flare in the setting of URI.  Vaccines are up-to-date.  Plan to continue same regimen since he is doing well.  We will plan to continue Stiolto 2 puffs once daily. Keep your albuterol available to use 2 puffs if needed for shortness of breath, chest tightness, wheezing. Keep your flu shot, COVID-19 vaccine up-to-date this fall Please call for any increase in respiratory symptoms including coughing, mucus production, shortness of breath. Follow Dr. Delton Coombes in 1 year, sooner if you have any problems.

## 2022-08-24 NOTE — Assessment & Plan Note (Signed)
CT scan of the chest from 01/12/2022 showed some mild bilateral bronchiectatic change and peripheral interstitial prominence. Suspect that this represents sequela from his COVID 19 infections.  In absence of constitutional symptoms or any new respiratory symptoms do not believe that this needs any dedicated follow-up.  We would reimage if there is a clinical change.

## 2022-08-24 NOTE — Patient Instructions (Signed)
We will plan to continue Stiolto 2 puffs once daily. Keep your albuterol available to use 2 puffs if needed for shortness of breath, chest tightness, wheezing. Keep your flu shot, COVID-19 vaccine up-to-date this fall Please call for any increase in respiratory symptoms including coughing, mucus production, shortness of breath. Follow Dr. Delton Coombes in 1 year, sooner if you have any problems.

## 2022-10-02 NOTE — Progress Notes (Unsigned)
HPI :  77 year old male with history of chronic GERD, adenomatous colon polyps, CKD, carotid artery disease, TIAs on plavix, CAD, diastolic heart failure, HIV, hyperlipidemia, gynecomastia   Jeffrey [name] follows with Dr. Eden Emms for history of CAD, CHF.  He was last seen by cardiology in mid June, was complaining of dyspnea time.  This was felt not to be cardiac related.  He has an upcoming appointment with Pulmonary.    Jeffrey [name] was last seen in our office in February 2022 for follow-up on GERD.  He was having mild dysphagia at that time.  His dysphagia is chronic. He has had prior evaluation with barium study, endoscopy with dilation and esophageal manometry. There has been no obvious evidence of dysmotility, no overt stricturing.   Interval History:  Jeffrey [name] is here for refill on pantoprazole.  As suggested at time of last visit he did decrease pantoprazole to 20 mg daily.  This worked for a while but at some point (he cannot remember when) he had to resume twice daily dosing due to recurrent symptoms including heartburn and regurgitation.  For the last few months he does feel like his GERD symptoms have been back under fairly good control.  He goes to bed on an empty stomach, avoids trigger foods.  He consumes very little caffeine.  He does have chronic dysphagia and says that this has gotten much worse lately.  He is particularly having problems swallowing chicken, beef and bread.  No odynophagia   Previous GI Evaluation    Esophageal manometry 09/03/2017 - low resting LES pressure with appropriate relaxation, no evidence of dysmotility Barium swallow 11/21/2017 - small HH, evidence of reflux, no obvious stricture, normal motility EGD 10/16/2016 - normal esophagus, empiric dilation to 18mm, biopsies to rule out EoE, gastritis noted - H pylori negative, no EoE Colonoscopy 08/04/2015 - 2 small polyps, internal hemorrhoids - adenomas - due for repeat 07/2022   EGD 12/05/21: - Esophagogastric landmarks  identified. - 1 cm hiatal hernia - Z-line slightly irregular but did not meet criteria for Barrett's. - Normal esophagus otherwise - empiric dilation performed to 18mm - Normal stomach. - Normal examined duodenum.  Has COPD  Past Medical History:  Diagnosis Date   CAD (coronary artery disease)    Chronic kidney disease    Colon polyps    COPD (chronic obstructive pulmonary disease) (HCC)    COVID-19    05/2019   GERD (gastroesophageal reflux disease)    Heart murmur    History of kidney stones    HIV (human immunodeficiency virus infection) (HCC) 1999   Hypertension    Pneumonia    Stroke (HCC)    right side weakness   TIA (transient ischemic attack)      Past Surgical History:  Procedure Laterality Date   CHOLECYSTECTOMY  1982   COLONOSCOPY     ESOPHAGEAL MANOMETRY N/A 09/03/2017   Procedure: ESOPHAGEAL MANOMETRY (EM);  Surgeon: Napoleon Form, MD;  Location: [facility];  Service: Endoscopy;  Laterality: N/A;   IR ANGIO INTRA EXTRACRAN SEL COM CAROTID INNOMINATE BILAT MOD SED  04/21/2019   IR ANGIO INTRA EXTRACRAN SEL COM CAROTID INNOMINATE BILAT MOD SED  05/11/2020   IR ANGIO VERTEBRAL SEL VERTEBRAL BILAT MOD SED  04/21/2019   IR ANGIO VERTEBRAL SEL VERTEBRAL BILAT MOD SED  05/11/2020   IR PTA INTRACRANIAL  05/14/2019   IR US GUIDE VASC ACCESS RIGHT  05/11/2020   KNEE SURGERY     LEFT KNEE   RADIOLOGY WITH ANESTHESIA  N/A 05/14/2019   Procedure: IR WITH ANESTHESIA STENTING;  Surgeon: Julieanne Cotton, MD;  Location: [facility] OR;  Service: Radiology;  Laterality: N/A;   TONSILLECTOMY     Family History  Problem Relation Age of Onset   Hypertension Mother    Heart attack Mother 36   Alzheimer's disease Sister    Heart disease Sister    Alzheimer's disease Sister    Drug abuse Sister    COPD Brother    Kidney cancer Brother    Colon cancer Neg Hx    Esophageal cancer Neg Hx    Pancreatic cancer Neg Hx    Stomach cancer Neg Hx    Liver disease Neg Hx    Social History    Tobacco Use   Smoking status: Never   Smokeless tobacco: Former    Types: Chew    Quit date: 2019  Vaping Use   Vaping Use: Never used  Substance Use Topics   Alcohol use: Yes    Comment: rare - few per year   Drug use: No   Current Outpatient Medications  Medication Sig Dispense Refill   abacavir-dolutegravir-lamiVUDine (TRIUMEQ) 600-50-300 MG tablet Take 1 tablet by mouth daily. 30 tablet 11   albuterol (VENTOLIN HFA) 108 (90 Base) MCG/ACT inhaler Inhale 2 puffs into the lungs every 6 (six) hours as needed for wheezing or shortness of breath. 8 g 6   Ascorbic Acid (VITAMIN C) 1000 MG tablet Take 1,000 mg by mouth daily at 6 PM.     aspirin EC 81 MG tablet Take 1 tablet (81 mg total) by mouth daily.     Cholecalciferol (VITAMIN D3) 125 MCG (5000 UT) TABS Take 5,000 Units by mouth daily at 6 PM.     clopidogrel (PLAVIX) 75 MG tablet Take 1 tablet (75 mg total) by mouth daily. 90 tablet 3   finasteride (PROSCAR) 5 MG tablet Take 5 mg by mouth in the morning and at bedtime.     losartan (COZAAR) 50 MG tablet Take 50 mg by mouth daily. Started by his kidney doctor- Dr. Thedore Mins - [facility]     pantoprazole (PROTONIX) 20 MG tablet Take 1 tablet (20 mg total) by mouth 2 (two) times daily. 180 tablet 3   rosuvastatin (CRESTOR) 20 MG tablet Take 1 tablet by mouth once daily 90 tablet 3   solifenacin (VESICARE) 10 MG tablet Take by mouth.     Tiotropium Bromide-Olodaterol (STIOLTO RESPIMAT) 2.5-2.5 MCG/ACT AERS INHALE 2 PUFFS BY MOUTH ONCE DAILY 4 g 5   vitamin B-12 (CYANOCOBALAMIN) 500 MCG tablet Take 500 mcg by mouth daily.     Zinc 50 MG TABS Take 50 mg by mouth daily at 6 PM.     No current facility-administered medications for this visit.   Allergies  Allergen Reactions   Baclofen Other (See Comments)    Back pain. Patient said it made him feel crazy    Efavirenz Rash   Nevirapine Rash and Other (See Comments)     Review of Systems: All systems reviewed and negative  except where noted in HPI.    No results found.  Physical Exam: There were no vitals taken for this visit. Constitutional: Pleasant,well-developed, ***male in no acute distress. HEENT: Normocephalic and atraumatic. Conjunctivae are normal. No scleral icterus. Neck supple.  Cardiovascular: Normal rate, regular rhythm.  Pulmonary/chest: Effort normal and breath sounds normal. No wheezing, rales or rhonchi. Abdominal: Soft, nondistended, nontender. Bowel sounds active throughout. There are no masses palpable. No hepatomegaly. Extremities:  no edema Lymphadenopathy: No cervical adenopathy noted. Neurological: Alert and oriented to person place and time. Skin: Skin is warm and dry. No rashes noted. Psychiatric: Normal mood and affect. Behavior is normal.   ASSESSMENT: 77 y.o. male here for assessment of the following  No diagnosis found.  PLAN:   Jeffrey Erp, MD

## 2022-10-03 ENCOUNTER — Telehealth: Payer: Self-pay

## 2022-10-03 ENCOUNTER — Encounter: Payer: Self-pay | Admitting: Gastroenterology

## 2022-10-03 ENCOUNTER — Ambulatory Visit (INDEPENDENT_AMBULATORY_CARE_PROVIDER_SITE_OTHER): Payer: Medicare HMO | Admitting: Gastroenterology

## 2022-10-03 VITALS — BP 118/64 | HR 72 | Ht 70.0 in | Wt 174.0 lb

## 2022-10-03 DIAGNOSIS — K219 Gastro-esophageal reflux disease without esophagitis: Secondary | ICD-10-CM | POA: Diagnosis not present

## 2022-10-03 DIAGNOSIS — Z860100 Personal history of colon polyps, unspecified: Secondary | ICD-10-CM

## 2022-10-03 DIAGNOSIS — Z79899 Other long term (current) drug therapy: Secondary | ICD-10-CM | POA: Diagnosis not present

## 2022-10-03 DIAGNOSIS — Z7902 Long term (current) use of antithrombotics/antiplatelets: Secondary | ICD-10-CM

## 2022-10-03 MED ORDER — PLENVU 140 G PO SOLR: 1.0000 | Freq: Once | ORAL | 0 refills | Status: AC

## 2022-10-03 MED ORDER — SUCRALFATE 1 GM/10ML PO SUSP: 1.0000 g | Freq: Four times a day (QID) | ORAL | 1 refills | Status: DC | PRN

## 2022-10-03 NOTE — Progress Notes (Signed)
CARDIOLOGY CONSULT NOTE       Patient ID: Jeffrey [name] MRN: [id number] DOB/AGE: 1946-04-24 77 y.o.  Referring Physician: Gwinda Maine  Primary Physician: Levin Erp, MD Primary Cardiologist: Eden Emms    HPI: 77 y.o. seen for f/u ? Diastolic dysfunction , dyspnea chest pain. History of HIV Echo 03/05/19 with EF 55-60% only impaired relaxation no significant valve disease. Cardiac CTA 03/21/19 calcium score 122 45 th percentile no obstructive disease BP improved on norvasc April 2021 had paresthesias left arm and abnormal gait. Resulted in intervention by Dr Corliss Skains to right supraclinoid carotid COVID positive 05/30/19 Rx with oxygen and steroids  Continues to have some cognitive decline since COVID  Continues to have some dyspnea Non contrast CT 08/04/20 with some chronic interstitial Changes with peripheral reticulation and bronchiectasis   Hospitalized 09/09/20 with diarrhea and colitis tested positive for Shigella and Rx cipro Tested positive for COVID again this admission   Lives alone. 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  Seen by [employer] practice 09/07/21 complaining of dizzy spells , blurry vision and headaches MRI head done 10/03/21 benign MRA no large vessel stenosis/occlusion He's had PCI of supraclinoid ICA in 2021 Sees Dr Epimenio Foot from Neurology  Discussed referral to pulmonary since he still has a lot of dyspnea not related to his heart  He needs a colonoscopy Plavix is prescribed by neurology for his suprclinoid ICA dx and TIAls should be ok to hold for procedure    ROS All other systems reviewed and negative except as noted above  Past Medical History:  Diagnosis Date   CAD (coronary artery disease)    Chronic kidney disease    Colon polyps    COPD (chronic obstructive pulmonary disease) (HCC)    COVID-19    05/2019   GERD (gastroesophageal reflux disease)    Heart murmur    History of kidney stones    HIV (human  immunodeficiency virus infection) (HCC) 1999   Hypertension    Pneumonia    Stroke (HCC)    right side weakness   TIA (transient ischemic attack)     Family History  Problem Relation Age of Onset   Hypertension Mother    Heart attack Mother 69   Alzheimer's disease Sister    Heart disease Sister    Alzheimer's disease Sister    Drug abuse Sister    COPD Brother    Kidney cancer Brother    Colon cancer Neg Hx    Esophageal cancer Neg Hx    Pancreatic cancer Neg Hx    Stomach cancer Neg Hx    Liver disease Neg Hx     Social History   Socioeconomic History   Marital status: Divorced    Spouse name: Not on file   Number of children: 2   Years of education: 14   Highest education level: Some college, no degree  Occupational History   Occupation: retired    Associate Professor: [employer]  Tobacco Use   Smoking status: Never   Smokeless tobacco: Former    Types: Chew    Quit date: 2019  Vaping Use   Vaping Use: Never used  Substance and Sexual Activity   Alcohol use: Yes    Comment: rare - few per year   Drug use: No   Sexual activity: Not Currently    Partners: Female    Comment: declined condoms  Other Topics Concern   Not on file  Social History Narrative  Lives alone   Right handed    Caffeine use: tea, mainly water      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, English as a foreign language   2 children - 1 boy and 1 girl   Social Determinants of Health   Financial Resource Strain: Medium Risk (07/03/2022)   Overall Financial Resource Strain (CARDIA)    Difficulty of Paying Living Expenses: Somewhat hard  Food Insecurity: No Food Insecurity (07/03/2022)   Hunger Vital Sign    Worried About Running Out of Food in the Last Year: Never true    Ran Out of Food in the Last Year: Never true  Transportation Needs: No Transportation Needs (07/03/2022)   PRAPARE - Administrator, Civil Service (Medical): No    Lack of Transportation (Non-Medical): No  Physical Activity: Sufficiently Active  (07/03/2022)   Exercise Vital Sign    Days of Exercise per Week: 7 days    Minutes of Exercise per Session: 60 min  Stress: No Stress Concern Present (07/03/2022)   [employer] of Occupational Health - Occupational Stress Questionnaire    Feeling of Stress : Not at all  Social Connections: Socially Isolated (07/03/2022)   Social Connection and Isolation Panel [NHANES]    Frequency of Communication with Friends and Family: More than three times a week    Frequency of Social Gatherings with Friends and Family: Once a week    Attends Religious Services: Never    Database administrator or Organizations: Patient declined    Attends Banker Meetings: Never    Marital Status: Divorced  Catering manager Violence: Not At Risk (07/03/2022)   Humiliation, Afraid, Rape, and Kick questionnaire    Fear of Current or Ex-Partner: No    Emotionally Abused: No    Physically Abused: No    Sexually Abused: No    Past Surgical History:  Procedure Laterality Date   CHOLECYSTECTOMY  1982   COLONOSCOPY     ESOPHAGEAL MANOMETRY N/A 09/03/2017   Procedure: ESOPHAGEAL MANOMETRY (EM);  Surgeon: Napoleon Form, MD;  Location: [facility];  Service: Endoscopy;  Laterality: N/A;   IR ANGIO INTRA EXTRACRAN SEL COM CAROTID INNOMINATE BILAT MOD SED  04/21/2019   IR ANGIO INTRA EXTRACRAN SEL COM CAROTID INNOMINATE BILAT MOD SED  05/11/2020   IR ANGIO VERTEBRAL SEL VERTEBRAL BILAT MOD SED  04/21/2019   IR ANGIO VERTEBRAL SEL VERTEBRAL BILAT MOD SED  05/11/2020   IR PTA INTRACRANIAL  05/14/2019   IR US GUIDE VASC ACCESS RIGHT  05/11/2020   KNEE SURGERY     LEFT KNEE   RADIOLOGY WITH ANESTHESIA N/A 05/14/2019   Procedure: IR WITH ANESTHESIA STENTING;  Surgeon: Julieanne Cotton, MD;  Location: [facility];  Service: Radiology;  Laterality: N/A;   TONSILLECTOMY          Physical Exam: Blood pressure 122/80, pulse 74, height 5\' 10"  (1.778 m), weight 170 lb 6.4 oz (77.3 kg), SpO2 96 %.   Affect  appropriate Healthy:  appears stated age HEENT: normal Neck supple with no adenopathy JVP normal no bruits no thyromegaly Lungs clear with no wheezing and good diaphragmatic motion Heart:  S1/S2 no murmur, no rub, gallop or click PMI normal Abdomen: benighn, BS positve, no tenderness, no AAA no bruit.  No HSM or HJR Distal pulses intact with no bruits No edema Neuro non-focal Skin warm and dry No muscular weakness   Labs:   Lab Results  Component Value Date   WBC 5.9 02/20/2022  HGB 14.9 02/20/2022   HCT 43.8 02/20/2022   MCV 102.6 (H) 02/20/2022   PLT 175 02/20/2022    No results for input(s): "NA", "K", "CL", "CO2", "BUN", "CREATININE", "CALCIUM", "PROT", "BILITOT", "ALKPHOS", "ALT", "AST", "GLUCOSE" in the last 168 hours.  Invalid input(s): "LABALBU" Lab Results  Component Value Date   CKTOTAL 51 04/12/2010   CKMB 1.6 04/12/2010   TROPONINI <0.01        NO INDICATION OF MYOCARDIAL INJURY. 04/12/2010    Lab Results  Component Value Date   CHOL 127 02/20/2022   CHOL 131 02/17/2021   CHOL 119 02/10/2020   Lab Results  Component Value Date   HDL 41 02/20/2022   HDL 43 02/17/2021   HDL 45 02/10/2020   Lab Results  Component Value Date   LDLCALC 59 02/20/2022   LDLCALC 63 02/17/2021   LDLCALC 53 02/10/2020   Lab Results  Component Value Date   TRIG 203 (H) 02/20/2022   TRIG 186 (H) 02/17/2021   TRIG 128 02/10/2020   Lab Results  Component Value Date   CHOLHDL 3.1 02/20/2022   CHOLHDL 3.0 02/17/2021   CHOLHDL 2.6 02/10/2020   No results found for: "LDLDIRECT"    Radiology: No results found.   EKG: SR rate 74 normal 10/16/2022    ASSESSMENT AND PLAN:   1. Dyspnea:  Post COVID with some interstitial lung changes no evidence of cardiac etiology Seen by [facility] with some COPD/Tracheomalacia and bronchiectasis Improved on Stiolto 2. Chest Pain: non cardiac cardiac CTA 03/21/19 non obstructive CAD  3. HIV:  Continue anti-retroviral Rx f/u Dr  Cathie Olden ID RX Triumeq 4. Prostate:  On proscar f/u PSA with primary  5. TIA:  In April subsequent IR intervention to right intracranial ICA by Dr Nicki Reaper 05/14/19 continue DAT with Plavix  Ok to hold for colonoscopy 6. HTN:  Improved with norvasc   7. HLD   Continue statin LDL 52 02/10/20  8. COVID: positive 05/30/19 and 09/07/20  CXR with faint bilateral patchy lung opacities Rx as outpatient had deltasone BNP and troponin negative Mild elevation in inflammatory markersF/u with primary seems to have some permanent interstitial changes contributing to dyspnea as well as cognitive decline  9. Preoperative:  ok to have colonoscopy next Tuesday with [employer] Plavix starting Friday of this week   F/U in a year     Signed: Charlton Haws 10/16/2022, 8:20 AM

## 2022-10-03 NOTE — Telephone Encounter (Signed)
   Jeffrey [name] 08-19-1945 [id number]  Dear Dr. Epimenio Foot:  We have scheduled the above named patient for a(n) colonoscopy procedure with Dr. Ileene Patrick. Our records show that he is on anticoagulation therapy.  Please advise as to whether the patient may come off their therapy of PLAVIX 5 days prior to their procedure which is scheduled for 10-24-22.  Please route your response to Berlinda Last, CMA or fax response to [phone]. Thank you. Our office number is [phone]  Sincerely,    [facility]

## 2022-10-03 NOTE — Telephone Encounter (Signed)
[facility] Pre-operative Risk Assessment     Request for surgical clearance:     Endoscopy Procedure  What type of surgery is being performed?     COLONOSCOPY  When is this surgery scheduled?     10-24-22  What type of clearance is required ?   Pharmacy  Are there any medications that need to be held prior to surgery and how long? PLAVIX 5 DAYS  Practice name and name of physician performing surgery?   [facility]  What is your office phone and fax number?      Phone- [phone]  Fax- [phone] ATTN JAN Tawnia Schirm  Anesthesia type (None, local, MAC, general) ?       MAC   THANK YOU

## 2022-10-03 NOTE — Patient Instructions (Addendum)
_______________________________________________________  If your blood pressure at your visit was 140/90 or greater, please contact your primary care physician to follow up on this.  _______________________________________________________  If you are age 75 or older, your body mass index should be between 23-30. Your Body mass index is 24.97 kg/m. If this is out of the aforementioned range listed, please consider follow up with your Primary Care Provider.  If you are age 63 or younger, your body mass index should be between 19-25. Your Body mass index is 24.97 kg/m. If this is out of the aformentioned range listed, please consider follow up with your Primary Care Provider.   ________________________________________________________  The [facility] providers would like to encourage you to use [facility] to communicate with providers for non-urgent requests or questions.  Due to long hold times on the telephone, sending your provider a message by [facility] may be a faster and more efficient way to get a response.  Please allow 48 business hours for a response.  Please remember that this is for non-urgent requests.  _______________________________________________________  Bonita Quin have been scheduled for a colonoscopy. Please follow written instructions given to you at your visit today.  Please pick up your prep supplies at the pharmacy within the next 1-3 days. If you use inhalers (even only as needed), please bring them with you on the day of your procedure.  You will be contacted by our office prior to your procedure for directions on holding your Plavix.  If you do not hear from our office 1 week prior to your scheduled procedure, please call [phone] to discuss.  Please continue aspirin when holding Plavix.  Increase Protonix to 40 mg twice daily for 2 weeks.  Then use 20 mg to 40 mg twice a day, the lowest dose needed to manages your symptoms.  We have sent the following medications to your  pharmacy for you to pick up at your convenience: Carafate: Take 10 ml every 6 hours as needed.  Thank you for entrusting me with your care and for choosing [facility], Dr. Ileene Patrick

## 2022-10-03 NOTE — Telephone Encounter (Signed)
   Patient Name: Jeffrey [name]  DOB: 12/20/1945 MRN: [id number]  Primary Cardiologist: Charlton Haws, MD  Chart reviewed as part of pre-operative protocol coverage.   Please advise that guidance for holding Plavix will have to come from patient's prescribing physician.  Patient is on Plavix due to history of TIA/CVA.   Napoleon Form, Leodis Rains, NP 10/03/2022, 9:11 AM

## 2022-10-04 NOTE — Telephone Encounter (Signed)
LM for patient to call back to discuss holding Plavix starting on 6-13 for 6-18 procedure

## 2022-10-04 NOTE — Telephone Encounter (Signed)
Patient called back. He voiced understanding to hold Plavix starting on 6-13

## 2022-10-16 ENCOUNTER — Encounter: Payer: Self-pay | Admitting: Cardiovascular Disease

## 2022-10-16 ENCOUNTER — Ambulatory Visit: Payer: Medicare HMO | Attending: Cardiovascular Disease | Admitting: Cardiovascular Disease

## 2022-10-16 VITALS — BP 122/80 | HR 74 | Ht 70.0 in | Wt 170.4 lb

## 2022-10-16 DIAGNOSIS — E785 Hyperlipidemia, unspecified: Secondary | ICD-10-CM | POA: Diagnosis not present

## 2022-10-16 DIAGNOSIS — Z01810 Encounter for preprocedural cardiovascular examination: Secondary | ICD-10-CM

## 2022-10-16 DIAGNOSIS — I2510 Atherosclerotic heart disease of native coronary artery without angina pectoris: Secondary | ICD-10-CM | POA: Diagnosis not present

## 2022-10-16 DIAGNOSIS — G459 Transient cerebral ischemic attack, unspecified: Secondary | ICD-10-CM

## 2022-10-16 NOTE — Patient Instructions (Signed)
Medication Instructions:  Your physician recommends that you continue on your current medications as directed. Please refer to the Current Medication list given to you today.  *If you need a refill on your cardiac medications before your next appointment, please call your pharmacy*  Lab Work: If you have labs (blood work) drawn today and your tests are completely normal, you will receive your results only by: MyChart Message (if you have MyChart) OR A paper copy in the mail If you have any lab test that is abnormal or we need to change your treatment, we will call you to review the results.  Testing/Procedures: None ordered today.  Follow-Up: At [facility], you and your health needs are our priority.  As part of our continuing mission to provide you with exceptional heart care, we have created designated Provider Care Teams.  These Care Teams include your primary Cardiologist (physician) and Advanced Practice Providers (APPs -  Physician Assistants and Nurse Practitioners) who all work together to provide you with the care you need, when you need it.  We recommend signing up for the patient portal called "MyChart".  Sign up information is provided on this After Visit Summary.  MyChart is used to connect with patients for Virtual Visits (Telemedicine).  Patients are able to view lab/test results, encounter notes, upcoming appointments, etc.  Non-urgent messages can be sent to your provider as well.   To learn more about what you can do with MyChart, go to [email or web address].    Your next appointment:   1 year(s)  Provider:   Peter Nishan, MD      

## 2022-10-24 ENCOUNTER — Encounter: Payer: Self-pay | Admitting: Gastroenterology

## 2022-10-24 ENCOUNTER — Ambulatory Visit (AMBULATORY_SURGERY_CENTER): Payer: Medicare HMO | Admitting: Gastroenterology

## 2022-10-24 VITALS — BP 122/75 | HR 76 | Temp 98.9°F | Resp 23 | Ht 70.0 in | Wt 174.0 lb

## 2022-10-24 DIAGNOSIS — D123 Benign neoplasm of transverse colon: Secondary | ICD-10-CM | POA: Diagnosis not present

## 2022-10-24 DIAGNOSIS — Z09 Encounter for follow-up examination after completed treatment for conditions other than malignant neoplasm: Secondary | ICD-10-CM | POA: Diagnosis not present

## 2022-10-24 DIAGNOSIS — Z86010 Personal history of colonic polyps: Secondary | ICD-10-CM

## 2022-10-24 DIAGNOSIS — K633 Ulcer of intestine: Secondary | ICD-10-CM | POA: Diagnosis not present

## 2022-10-24 DIAGNOSIS — K5190 Ulcerative colitis, unspecified, without complications: Secondary | ICD-10-CM | POA: Diagnosis not present

## 2022-10-24 MED ORDER — SODIUM CHLORIDE 0.9 % IV SOLN: 500.0000 mL | INTRAVENOUS | Status: DC

## 2022-10-24 NOTE — Op Note (Signed)
[facility] Patient Name: Jeffrey [name] Procedure Date: 10/24/2022 9:54 AM MRN: [id number] Endoscopist: Viviann Spare P. Adela Lank , MD, [id number] Age: 77 Referring MD:  Date of Birth: 03-Jan-1946 Gender: Male Account #: [id number] Procedure:                Colonoscopy Indications:              High risk colon cancer surveillance: Personal                            history of colonic polyps - adenoma removed 07/2015 Medicines:                Monitored Anesthesia Care Procedure:                Pre-Anesthesia Assessment:                           - Prior to the procedure, a History and Physical                            was performed, and patient medications and                            allergies were reviewed. The patient's tolerance of                            previous anesthesia was also reviewed. The risks                            and benefits of the procedure and the sedation                            options and risks were discussed with the patient.                            All questions were answered, and informed consent                            was obtained. Prior Anticoagulants: The patient has                            taken Plavix (clopidogrel), last dose was 5 days                            prior to procedure. ASA Grade Assessment: III - A                            patient with severe systemic disease. After                            reviewing the risks and benefits, the patient was                            deemed in satisfactory condition to undergo the  procedure.                           After obtaining informed consent, the colonoscope                            was passed under direct vision. Throughout the                            procedure, the patient's blood pressure, pulse, and                            oxygen saturations were monitored continuously. The                            Olympus PCF-H190DL (#[id number])  Colonoscope was                            introduced through the anus and advanced to the the                            terminal ileum, with identification of the                            appendiceal orifice and IC valve. The colonoscopy                            was performed without difficulty. The patient                            tolerated the procedure well. The quality of the                            bowel preparation was good. The terminal ileum,                            ileocecal valve, appendiceal orifice, and rectum                            were photographed. Scope In: 10:06:04 AM Scope Out: 10:25:06 AM Scope Withdrawal Time: 0 hours 11 minutes 9 seconds  Total Procedure Duration: 0 hours 19 minutes 2 seconds  Findings:                 The perianal and digital rectal examinations were                            normal.                           The terminal ileum appeared normal.                           A 4 mm polyp was found in the transverse colon. The  polyp was sessile. The polyp was removed with a                            cold snare. Resection and retrieval were complete.                           Localized inflammation characterized by erythema,                            friability, granularity and ulceration was found in                            the recto-sigmoid colon. Biopsies were taken with a                            cold forceps for histology.                           Internal hemorrhoids were found.                           The colon was tortous. Given small rectum                            retroflexed views not obtained. The exam was                            otherwise without abnormality. Complications:            No immediate complications. Estimated blood loss:                            Minimal. Estimated Blood Loss:     Estimated blood loss was minimal. Impression:               - The examined portion of the  ileum was normal.                           - Tortous colon.                           - One 4 mm polyp in the transverse colon, removed                            with a cold snare. Resected and retrieved.                           - Localized inflammation was found in the                            recto-sigmoid colon. Biopsied.                           - Internal hemorrhoids.                           -  The examination was otherwise normal. Recommendation:           - Patient has a contact number available for                            emergencies. The signs and symptoms of potential                            delayed complications were discussed with the                            patient. Return to normal activities tomorrow.                            Written discharge instructions were provided to the                            patient.                           - Resume previous diet.                           - Continue present medications.                           - Resume Plavix tomorrow.                           - Await pathology results. Viviann Spare P. Terry Abila, MD 10/24/2022 10:31:14 AM This report has been signed electronically.

## 2022-10-24 NOTE — Progress Notes (Signed)
History and Physical Interval Note: Seen on 10/03/22 - no interval changes. Adenomas removed 07/2015 - here for surveillance colonoscopy. Feels well. Plavix held for 5 days for this exam/. Feels well without complaints otherwise, wishes to proceed.    10/24/2022 9:57 AM  Jeffrey [name]  has presented today for endoscopic procedure(s), with the diagnosis of  Encounter Diagnosis  Name Primary?   Personal history of colonic polyps Yes  .  The various methods of evaluation and treatment have been discussed with the patient and/or family. After consideration of risks, benefits and other options for treatment, the patient has consented to  the endoscopic procedure(s).   The patient's history has been reviewed, patient examined, no change in status, stable for surgery.  I have reviewed the patient's chart and labs.  Questions were answered to the patient's satisfaction.    Harlin Rain, MD [facility]

## 2022-10-24 NOTE — Progress Notes (Signed)
Vss nad trans to pacu.  Pt with COPD sats sagging some breathing into rebreathing mask on 2lpm.  vss

## 2022-10-24 NOTE — Patient Instructions (Signed)
Resume Plavix tomorrow YOU HAD AN ENDOSCOPIC PROCEDURE TODAY: Refer to the procedure report and other information in the discharge instructions given to you for any specific questions about what was found during the examination. If this information does not answer your questions, please call [place] office at [phone] to clarify.   YOU SHOULD EXPECT: Some feelings of bloating in the abdomen. Passage of more gas than usual. Walking can help get rid of the air that was put into your GI tract during the procedure and reduce the bloating. If you had a lower endoscopy (such as a colonoscopy or flexible sigmoidoscopy) you may notice spotting of blood in your stool or on the toilet paper. Some abdominal soreness may be present for a day or two, also.  DIET: Your first meal following the procedure should be a light meal and then it is ok to progress to your normal diet. A half-sandwich or bowl of soup is an example of a good first meal. Heavy or fried foods are harder to digest and may make you feel nauseous or bloated. Drink plenty of fluids but you should avoid alcoholic beverages for 24 hours. If you had a esophageal dilation, please see attached instructions for diet.    ACTIVITY: Your care partner should take you home directly after the procedure. You should plan to take it easy, moving slowly for the rest of the day. You can resume normal activity the day after the procedure however YOU SHOULD NOT DRIVE, use power tools, machinery or perform tasks that involve climbing or major physical exertion for 24 hours (because of the sedation medicines used during the test).   SYMPTOMS TO REPORT IMMEDIATELY: A gastroenterologist can be reached at any hour. Please call [phone]  for any of the following symptoms:  Following lower endoscopy (colonoscopy, flexible sigmoidoscopy) Excessive amounts of blood in the stool  Significant tenderness, worsening of abdominal pains  Swelling of the abdomen that is new,  acute  Fever of 100 or higher   FOLLOW UP:  If any biopsies were taken you will be contacted by phone or by letter within the next 1-3 weeks. Call [phone]  if you have not heard about the biopsies in 3 weeks.  Please also call with any specific questions about appointments or follow up tests.

## 2022-10-24 NOTE — Progress Notes (Signed)
Called to room to assist during endoscopic procedure.  Patient ID and intended procedure confirmed with present staff. Received instructions for my participation in the procedure from the performing physician.  

## 2022-10-25 ENCOUNTER — Telehealth: Payer: Self-pay

## 2022-10-25 NOTE — Telephone Encounter (Signed)
  Follow up Call-     10/24/2022    9:02 AM 12/05/2021    9:28 AM  Call back number  Post procedure Call Back phone  # [phone]  Permission to leave phone message Yes Yes     Patient questions:  Do you have a fever, pain , or abdominal swelling? No. Pain Score  0 *  Have you tolerated food without any problems? Yes.    Have you been able to return to your normal activities? Yes.    Do you have any questions about your discharge instructions: Diet   No. Medications  No. Follow up visit  No.  Do you have questions or concerns about your Care? No.  Actions: * If pain score is 4 or above: No action needed, pain <4.

## 2022-11-03 ENCOUNTER — Encounter: Payer: Self-pay | Admitting: Gastroenterology

## 2022-11-06 ENCOUNTER — Other Ambulatory Visit: Payer: Self-pay | Admitting: Nurse Practitioner

## 2022-11-07 DIAGNOSIS — R351 Nocturia: Secondary | ICD-10-CM | POA: Diagnosis not present

## 2022-11-07 DIAGNOSIS — N401 Enlarged prostate with lower urinary tract symptoms: Secondary | ICD-10-CM | POA: Diagnosis not present

## 2022-11-08 DIAGNOSIS — H2512 Age-related nuclear cataract, left eye: Secondary | ICD-10-CM | POA: Diagnosis not present

## 2022-11-08 DIAGNOSIS — H35371 Puckering of macula, right eye: Secondary | ICD-10-CM | POA: Diagnosis not present

## 2022-11-10 ENCOUNTER — Telehealth: Payer: Self-pay

## 2022-11-10 NOTE — Telephone Encounter (Signed)
Pt called into the office today to review pathology results and recommendations. I advised that letter was mailed on 11/03/22 and he may not have received it yet. We reviewed letter with pathology results and recommendations. Pt is aware of his follow up appt in October with Dr. Adela Lank. Patient is aware that he should receive his results and appt information in the mail soon. If pt has not received letters in the next 2 weeks he will call and let us know. Pt verbalized understanding of all information and had no concerns at the end of the call.

## 2022-11-18 ENCOUNTER — Other Ambulatory Visit: Payer: Self-pay | Admitting: Cardiovascular Disease

## 2022-11-26 ENCOUNTER — Other Ambulatory Visit: Payer: Self-pay | Admitting: Cardiovascular Disease

## 2022-11-26 DIAGNOSIS — E785 Hyperlipidemia, unspecified: Secondary | ICD-10-CM

## 2022-11-30 ENCOUNTER — Other Ambulatory Visit (HOSPITAL_COMMUNITY): Payer: Self-pay | Admitting: Interventional Radiology

## 2022-11-30 DIAGNOSIS — I771 Stricture of artery: Secondary | ICD-10-CM

## 2022-12-08 ENCOUNTER — Ambulatory Visit (HOSPITAL_COMMUNITY)
Admission: RE | Admit: 2022-12-08 | Discharge: 2022-12-08 | Disposition: A | Discharge status: Home / Self Care | Payer: Medicare HMO | Source: Ambulatory Visit | Attending: Interventional Radiology | Admitting: Interventional Radiology

## 2022-12-08 DIAGNOSIS — I771 Stricture of artery: Secondary | ICD-10-CM | POA: Diagnosis not present

## 2022-12-08 DIAGNOSIS — I6601 Occlusion and stenosis of right middle cerebral artery: Secondary | ICD-10-CM | POA: Diagnosis not present

## 2022-12-08 DIAGNOSIS — I6523 Occlusion and stenosis of bilateral carotid arteries: Secondary | ICD-10-CM | POA: Insufficient documentation

## 2022-12-08 DIAGNOSIS — I672 Cerebral atherosclerosis: Secondary | ICD-10-CM | POA: Insufficient documentation

## 2022-12-08 DIAGNOSIS — I6521 Occlusion and stenosis of right carotid artery: Secondary | ICD-10-CM | POA: Diagnosis not present

## 2022-12-08 DIAGNOSIS — I651 Occlusion and stenosis of basilar artery: Secondary | ICD-10-CM | POA: Diagnosis not present

## 2022-12-21 ENCOUNTER — Telehealth (HOSPITAL_COMMUNITY): Payer: Self-pay

## 2022-12-21 NOTE — Telephone Encounter (Signed)
Pt agreed to f/u in 6 months with an mra. AB

## 2022-12-25 ENCOUNTER — Encounter: Payer: Self-pay | Admitting: Pharmacist

## 2022-12-25 NOTE — Progress Notes (Signed)
Adherence check-in. Appears adherent to losartan as last filled for 90 day supply on 11/04/22 and rosuvastatin as last filled for 90 day supply on 11/27/22.

## 2023-01-02 DIAGNOSIS — H2512 Age-related nuclear cataract, left eye: Secondary | ICD-10-CM | POA: Diagnosis not present

## 2023-01-08 ENCOUNTER — Other Ambulatory Visit: Payer: Self-pay | Admitting: Emergency Medicine

## 2023-01-15 DIAGNOSIS — H2511 Age-related nuclear cataract, right eye: Secondary | ICD-10-CM | POA: Diagnosis not present

## 2023-01-16 DIAGNOSIS — H2511 Age-related nuclear cataract, right eye: Secondary | ICD-10-CM | POA: Diagnosis not present

## 2023-02-04 ENCOUNTER — Other Ambulatory Visit: Payer: Self-pay | Admitting: Nurse Practitioner

## 2023-02-14 ENCOUNTER — Other Ambulatory Visit: Payer: Self-pay | Admitting: Emergency Medicine

## 2023-02-21 ENCOUNTER — Ambulatory Visit (INDEPENDENT_AMBULATORY_CARE_PROVIDER_SITE_OTHER): Payer: Medicare HMO | Admitting: Gastroenterology

## 2023-02-21 ENCOUNTER — Encounter: Payer: Self-pay | Admitting: Gastroenterology

## 2023-02-21 VITALS — BP 136/82 | HR 80 | Ht 67.5 in | Wt 176.5 lb

## 2023-02-21 DIAGNOSIS — K219 Gastro-esophageal reflux disease without esophagitis: Secondary | ICD-10-CM

## 2023-02-21 DIAGNOSIS — K639 Disease of intestine, unspecified: Secondary | ICD-10-CM | POA: Diagnosis not present

## 2023-02-21 DIAGNOSIS — Z860100 Personal history of colon polyps, unspecified: Secondary | ICD-10-CM

## 2023-02-21 DIAGNOSIS — Z79899 Other long term (current) drug therapy: Secondary | ICD-10-CM | POA: Diagnosis not present

## 2023-02-21 NOTE — Patient Instructions (Signed)
Continue your medications.  Please follow up in 1 year.  Thank you for entrusting me with your care and for choosing [facility], Dr. Ileene Patrick    If your blood pressure at your visit was 140/90 or greater, please contact your primary care physician to follow up on this. ______________________________________________________  If you are age 75 or older, your body mass index should be between 23-30. Your Body mass index is 27.24 kg/m. If this is out of the aforementioned range listed, please consider follow up with your Primary Care Provider.  If you are age 6 or younger, your body mass index should be between 19-25. Your Body mass index is 27.24 kg/m. If this is out of the aformentioned range listed, please consider follow up with your Primary Care Provider.  ________________________________________________________  The [facility] providers would like to encourage you to use [facility] to communicate with providers for non-urgent requests or questions.  Due to long hold times on the telephone, sending your provider a message by [facility] may be a faster and more efficient way to get a response.  Please allow 48 business hours for a response.  Please remember that this is for non-urgent requests.  _______________________________________________________  Due to recent changes in healthcare laws, you may see the results of your imaging and laboratory studies on MyChart before your provider has had a chance to review them.  We understand that in some cases there may be results that are confusing or concerning to you. Not all laboratory results come back in the same time frame and the provider may be waiting for multiple results in order to interpret others.  Please give [place] 48 hours in order for your provider to thoroughly review all the results before contacting the office for clarification of your results.

## 2023-02-21 NOTE — Progress Notes (Signed)
HPI :  77 y/o male here for a follow up visit. We have followed him for GERD, dysphagia, history of colon polyps in the past. He also has a history of TIAs on Plavix, COPD, CKD.   He has been doing pretty well since have last seen him.  We previously had discussed increasing his Protonix dosing to 40 mg twice daily.  Over time he has been able to reduce back to 20 mg twice daily.  He states generally it is working pretty well for him and controls his reflux symptoms.  He currently denies any breakthrough heartburn, no nausea or vomiting, no dysphagia.  Certain particular trigger foods can sometimes cause rare symptoms.  He takes Carafate rarely for indigestion which tends to help when he takes it.  Recall his last EGD showed no evidence of Barrett's.  He appears to be pretty content with his current regimen and we discussed long-term risks of PPI use.  He is followed by [facility] for CKD which he states is stable.  Otherwise he had a history of 2 small adenomas in 2017 and underwent colonoscopy with me in June of this year for surveillance.  He had 1 small adenoma that was removed.  He otherwise had some nonspecific inflammatory changes in the left colon.  Biopsies showed no evidence of chronicity.  He states he has not had any bowel issues since have seen him.  No diarrhea, no blood in his stools.  I asked him if he had any severe pain or bleeding prior to his colonoscopy and he does not think that was the issue.  He continues to stay on Plavix but again no bleeding or problems otherwise that bother him.  He generally feels well without complaints today     Previous GI Evaluation    Esophageal manometry 09/03/2017 - low resting LES pressure with appropriate relaxation, no evidence of dysmotility Barium swallow 11/21/2017 - small HH, evidence of reflux, no obvious stricture, normal motility EGD 10/16/2016 - normal esophagus, empiric dilation to 18mm, biopsies to rule out EoE, gastritis noted - H  pylori negative, no EoE Colonoscopy 08/04/2015 - 2 small polyps, internal hemorrhoids - adenomas - due for repeat 07/2022   EGD 12/05/21: - Esophagogastric landmarks identified. - 1 cm hiatal hernia - Z-line slightly irregular but did not meet criteria for Barrett's. - Normal esophagus otherwise - empiric dilation performed to 18mm - Normal stomach. - Normal examined duodenum.   Echo 07/2019 - EF 60-65%, grade I DD     Colonoscopy 10/24/2022: - The perianal and digital rectal examinations were normal. - The terminal ileum appeared normal. - A 4 mm polyp was found in the transverse colon. The polyp was sessile. The polyp was removed with a cold snare. Resection and retrieval were complete. - Localized inflammation characterized by erythema, friability, granularity and ulceration was found in the recto-sigmoid colon. Biopsies were taken with a cold forceps for histology. - Internal hemorrhoids were found. - The colon was tortous. Given small rectum retroflexed views not obtained. The exam was otherwise without abnormality.  1. Surgical [P], colon, transverse, polyp (1) - TUBULAR ADENOMA - NEGATIVE FOR HIGH-GRADE DYSPLASIA OR MALIGNANCY 2. Surgical [P], colon, recto-sigmoid erosions - FOCALLY ACTIVE COLITIS WITH EROSION/ULCERATION, FIBROSIS, REACTIVE/REGENERATIVE TYPE CHANGES, AND ASSOCIATED MILD ARCHITECTURAL DISTORTION - NEGATIVE FOR GRANULOMATA, DYSPLASIA OR MALIGNANCY - SEE NOTE  2. No definitive features of chronicity are identified. The histologic features are non-specific and can be seen in low grade ischemia, medication effect, self-limited  infectious processes and inflammatory bowel disease. Clinical-pathologic correlation is recommended.    Past Medical History:  Diagnosis Date   CAD (coronary artery disease)    Chronic kidney disease    Colon polyps    COPD (chronic obstructive pulmonary disease) (HCC)    COVID-19    05/2019   GERD (gastroesophageal reflux disease)    Heart  murmur    History of kidney stones    HIV (human immunodeficiency virus infection) (HCC) 1999   Hypertension    Pneumonia    Stroke (HCC)    right side weakness   TIA (transient ischemic attack)      Past Surgical History:  Procedure Laterality Date   CATARACT EXTRACTION W/ INTRAOCULAR LENS IMPLANT Bilateral    CHOLECYSTECTOMY  1982   COLONOSCOPY     ESOPHAGEAL MANOMETRY N/A 09/03/2017   Procedure: ESOPHAGEAL MANOMETRY (EM);  Surgeon: Napoleon Form, MD;  Location: [facility];  Service: Endoscopy;  Laterality: N/A;   IR ANGIO INTRA EXTRACRAN SEL COM CAROTID INNOMINATE BILAT MOD SED  04/21/2019   IR ANGIO INTRA EXTRACRAN SEL COM CAROTID INNOMINATE BILAT MOD SED  05/11/2020   IR ANGIO VERTEBRAL SEL VERTEBRAL BILAT MOD SED  04/21/2019   IR ANGIO VERTEBRAL SEL VERTEBRAL BILAT MOD SED  05/11/2020   IR PTA INTRACRANIAL  05/14/2019   IR US GUIDE VASC ACCESS RIGHT  05/11/2020   KNEE SURGERY     LEFT KNEE   RADIOLOGY WITH ANESTHESIA N/A 05/14/2019   Procedure: IR WITH ANESTHESIA STENTING;  Surgeon: Julieanne Cotton, MD;  Location: [facility] OR;  Service: Radiology;  Laterality: N/A;   TONSILLECTOMY     Family History  Problem Relation Age of Onset   Hypertension Mother    Heart attack Mother 39   Alzheimer's disease Sister    Heart disease Sister    Alzheimer's disease Sister    Drug abuse Sister    COPD Brother    Kidney cancer Brother    Colon cancer Neg Hx    Esophageal cancer Neg Hx    Pancreatic cancer Neg Hx    Stomach cancer Neg Hx    Liver disease Neg Hx    Social History   Tobacco Use   Smoking status: Never   Smokeless tobacco: Former    Types: Chew    Quit date: 2019  Vaping Use   Vaping status: Never Used  Substance Use Topics   Alcohol use: Yes    Comment: rare - few per year   Drug use: No   Current Outpatient Medications  Medication Sig Dispense Refill   abacavir-dolutegravir-lamiVUDine (TRIUMEQ) 600-50-300 MG tablet Take 1 tablet by mouth daily.  30 tablet 11   albuterol (VENTOLIN HFA) 108 (90 Base) MCG/ACT inhaler Inhale 2 puffs into the lungs every 6 (six) hours as needed for wheezing or shortness of breath. 8 g 6   Ascorbic Acid (VITAMIN C) 1000 MG tablet Take 1,000 mg by mouth daily at 6 PM.     aspirin EC 81 MG tablet Take 1 tablet (81 mg total) by mouth daily.     Cholecalciferol (VITAMIN D3) 125 MCG (5000 UT) TABS Take 5,000 Units by mouth daily at 6 PM.     clopidogrel (PLAVIX) 75 MG tablet Take 1 tablet by mouth once daily 90 tablet 2   finasteride (PROSCAR) 5 MG tablet Take 5 mg by mouth in the morning and at bedtime.     losartan (COZAAR) 50 MG tablet Take 50 mg by mouth daily.  Started by his kidney doctor- Dr. Thedore Mins - [facility]     pantoprazole (PROTONIX) 20 MG tablet Take 1 tablet by mouth twice daily 180 tablet 0   rosuvastatin (CRESTOR) 20 MG tablet Take 1 tablet by mouth once daily 90 tablet 3   solifenacin (VESICARE) 10 MG tablet Take by mouth.     STIOLTO RESPIMAT 2.5-2.5 MCG/ACT AERS INHALE 2 PUFFS BY MOUTH ONCE DAILY 4 g 0   sucralfate (CARAFATE) 1 GM/10ML suspension Take 10 mLs (1 g total) by mouth every 6 (six) hours as needed. 420 mL 1   vitamin B-12 (CYANOCOBALAMIN) 500 MCG tablet Take 500 mcg by mouth daily.     Zinc 50 MG TABS Take 50 mg by mouth daily at 6 PM.     sildenafil (REVATIO) 20 MG tablet Take 20 mg by mouth as needed. (Patient not taking: Reported on 02/21/2023)     No current facility-administered medications for this visit.   Allergies  Allergen Reactions   Baclofen Other (See Comments)    Back pain. Patient said it made him feel crazy    Efavirenz Rash   Nevirapine Rash and Other (See Comments)     Review of Systems: All systems reviewed and negative except where noted in HPI.   Lab Results  Component Value Date   WBC 5.9 02/20/2022   HGB 14.9 02/20/2022   HCT 43.8 02/20/2022   MCV 102.6 (H) 02/20/2022   PLT 175 02/20/2022    Lab Results  Component Value Date   NA 141  02/20/2022   CL 104 02/20/2022   K 4.2 02/20/2022   CO2 29 02/20/2022   BUN 26 (H) 02/20/2022   CREATININE 1.91 (H) 02/20/2022   EGFR 52 (L) 09/07/2021   CALCIUM 9.8 02/20/2022   PHOS 1.5 (L) 09/25/2013   ALBUMIN 2.5 (L) 09/06/2020   GLUCOSE 109 (H) 02/20/2022     Lab Results  Component Value Date   ALT 14 02/20/2022   AST 12 02/20/2022   ALKPHOS 63 09/06/2020   BILITOT 0.4 02/20/2022     Physical Exam: BP 136/82 (BP Location: Left Arm, Patient Position: Sitting, Cuff Size: Normal)   Pulse 80   Ht 5' 7.5" (1.715 m)   Wt 176 lb 8 oz (80.1 kg)   BMI 27.24 kg/m  Constitutional: Pleasant,well-developed, male in no acute distress. Neurological: Alert and oriented to person place and time. Psychiatric: Normal mood and affect. Behavior is normal.   ASSESSMENT: 77 y.o. male here for assessment of the following  1. Gastroesophageal reflux disease, unspecified whether esophagitis present   2. Long-term current use of proton pump inhibitor therapy   3. Mucosal abnormality of colon   4. History of colon polyps    Has been able to decrease his dose of Protonix to 20 mg twice daily.  Generally controlling his symptoms fairly well.  Can continue Carafate as needed for rare breakthrough if that helps some.  We did discuss long-term risks of chronic PPI use, he understands risks that could potentially affect his kidney function.  He will continue to follow with nephrology, he states renal function has been stable.  We reviewed his colonoscopy with him.  I think he had benign/reactive changes in his colon that may have been more likely postinfectious or ischemic colitis perhaps but he had no symptoms for ischemic colitis.  He has no evidence of IBD on the biopsies and no bowel symptoms that bother him at baseline.  I do not feel that this  warrants any further evaluation if he is otherwise asymptomatic.  We otherwise discussed he had no high risk polyps on his exam, normally would not be  due for another colonoscopy for 7 years.  Given his age I do not feel that he warrants any further surveillance colonoscopy, would not be next due until he is 31s.  He agrees with that.  Can follow-up as needed otherwise   PLAN: - continue protonix 20mg  BID - discussed risks / benefits - use lowest dose needed to control symptoms - continue Carafate PRN - if escalating or not working, let us know - I think benign colonic changes - perhaps post infectious or ischemic but he had no symptoms for ischemia. I don't think any specific follow up is needed unless symptoms - no further colonoscopies needed given no high risk lesions or polyps - f/u one year for reassessment or sooner with issues.  Harlin Rain, MD [facility]

## 2023-03-09 ENCOUNTER — Other Ambulatory Visit: Payer: Self-pay

## 2023-03-09 DIAGNOSIS — B20 Human immunodeficiency virus [HIV] disease: Secondary | ICD-10-CM

## 2023-03-09 MED ORDER — TRIUMEQ 600-50-300 MG PO TABS: 1.0000 | ORAL_TABLET | Freq: Every day | ORAL | 0 refills | Status: DC

## 2023-03-13 DIAGNOSIS — H43812 Vitreous degeneration, left eye: Secondary | ICD-10-CM | POA: Diagnosis not present

## 2023-03-19 ENCOUNTER — Other Ambulatory Visit: Payer: Self-pay | Admitting: Emergency Medicine

## 2023-03-20 ENCOUNTER — Other Ambulatory Visit: Payer: Self-pay

## 2023-03-20 ENCOUNTER — Other Ambulatory Visit (HOSPITAL_COMMUNITY)
Admission: RE | Admit: 2023-03-20 | Discharge: 2023-03-20 | Disposition: A | Discharge status: Home / Self Care | Payer: Medicare HMO | Source: Ambulatory Visit | Attending: Infectious Diseases | Admitting: Infectious Diseases

## 2023-03-20 ENCOUNTER — Other Ambulatory Visit: Payer: Medicare HMO

## 2023-03-20 DIAGNOSIS — Z113 Encounter for screening for infections with a predominantly sexual mode of transmission: Secondary | ICD-10-CM

## 2023-03-20 DIAGNOSIS — B20 Human immunodeficiency virus [HIV] disease: Secondary | ICD-10-CM | POA: Diagnosis not present

## 2023-03-20 DIAGNOSIS — Z79899 Other long term (current) drug therapy: Secondary | ICD-10-CM | POA: Insufficient documentation

## 2023-03-21 LAB — URINE CYTOLOGY ANCILLARY ONLY
Chlamydia: NEGATIVE
Comment: NEGATIVE
Comment: NORMAL
Neisseria Gonorrhea: NEGATIVE

## 2023-03-21 LAB — T-HELPER CELL (CD4) - (RCID CLINIC ONLY)
CD4 % Helper T Cell: 22 % — ABNORMAL LOW (ref 33–65)
CD4 T Cell Abs: 501 /uL (ref 400–1790)

## 2023-03-22 LAB — CBC WITH DIFFERENTIAL/PLATELET
Absolute Lymphocytes: 2434 {cells}/uL (ref 850–3900)
Absolute Monocytes: 399 {cells}/uL (ref 200–950)
Basophils Absolute: 0 {cells}/uL (ref 0–200)
Basophils Relative: 0 %
Eosinophils Absolute: 91 {cells}/uL (ref 15–500)
Eosinophils Relative: 1.6 %
HCT: 41.3 % (ref 38.5–50.0)
Hemoglobin: 13.7 g/dL (ref 13.2–17.1)
MCH: 33.7 pg — ABNORMAL HIGH (ref 27.0–33.0)
MCHC: 33.2 g/dL (ref 32.0–36.0)
MCV: 101.7 fL — ABNORMAL HIGH (ref 80.0–100.0)
MPV: 8.8 fL (ref 7.5–12.5)
Monocytes Relative: 7 %
Neutro Abs: 2776 {cells}/uL (ref 1500–7800)
Neutrophils Relative %: 48.7 %
Platelets: 161 10*3/uL (ref 140–400)
RBC: 4.06 10*6/uL — ABNORMAL LOW (ref 4.20–5.80)
RDW: 12.6 % (ref 11.0–15.0)
Total Lymphocyte: 42.7 %
WBC: 5.7 10*3/uL (ref 3.8–10.8)

## 2023-03-22 LAB — LIPID PANEL
Cholesterol: 131 mg/dL (ref <200)
HDL: 48 mg/dL (ref >=40)
LDL Cholesterol (Calc): 58 mg/dL
Non-HDL Cholesterol (Calc): 83 mg/dL (ref <130)
Total CHOL/HDL Ratio: 2.7 (calc) (ref <5.0)
Triglycerides: 168 mg/dL — ABNORMAL HIGH (ref <150)

## 2023-03-22 LAB — COMPLETE METABOLIC PANEL WITH GFR
AG Ratio: 1.7 (calc) (ref 1.0–2.5)
ALT: 15 U/L (ref 9–46)
AST: 14 U/L (ref 10–35)
Albumin: 4.4 g/dL (ref 3.6–5.1)
Alkaline phosphatase (APISO): 70 U/L (ref 35–144)
BUN/Creatinine Ratio: 14 (calc) (ref 6–22)
BUN: 22 mg/dL (ref 7–25)
CO2: 26 mmol/L (ref 20–32)
Calcium: 9.5 mg/dL (ref 8.6–10.3)
Chloride: 105 mmol/L (ref 98–110)
Creat: 1.62 mg/dL — ABNORMAL HIGH (ref 0.70–1.28)
Globulin: 2.6 g/dL (ref 1.9–3.7)
Glucose, Bld: 101 mg/dL — ABNORMAL HIGH (ref 65–99)
Potassium: 4.3 mmol/L (ref 3.5–5.3)
Sodium: 140 mmol/L (ref 135–146)
Total Bilirubin: 0.3 mg/dL (ref 0.2–1.2)
Total Protein: 7 g/dL (ref 6.1–8.1)
eGFR: 44 mL/min/{1.73_m2} — ABNORMAL LOW (ref >=60)

## 2023-03-22 LAB — HIV-1 RNA QUANT-NO REFLEX-BLD
HIV 1 RNA Quant: NOT DETECTED {copies}/mL
HIV-1 RNA Quant, Log: NOT DETECTED {Log_copies}/mL

## 2023-03-22 LAB — RPR: RPR Ser Ql: NONREACTIVE

## 2023-03-25 ENCOUNTER — Other Ambulatory Visit: Payer: Self-pay | Admitting: Emergency Medicine

## 2023-03-29 DIAGNOSIS — H35371 Puckering of macula, right eye: Secondary | ICD-10-CM | POA: Diagnosis not present

## 2023-03-29 DIAGNOSIS — H43812 Vitreous degeneration, left eye: Secondary | ICD-10-CM | POA: Diagnosis not present

## 2023-03-29 DIAGNOSIS — Z961 Presence of intraocular lens: Secondary | ICD-10-CM | POA: Diagnosis not present

## 2023-04-03 ENCOUNTER — Ambulatory Visit: Payer: Medicare HMO | Admitting: Infectious Diseases

## 2023-04-03 ENCOUNTER — Other Ambulatory Visit: Payer: Self-pay

## 2023-04-03 ENCOUNTER — Encounter: Payer: Self-pay | Admitting: Internal Medicine

## 2023-04-03 ENCOUNTER — Encounter: Payer: Self-pay | Admitting: Infectious Diseases

## 2023-04-03 VITALS — BP 131/83 | HR 77 | Resp 16 | Ht 67.5 in | Wt 174.3 lb

## 2023-04-03 DIAGNOSIS — Z23 Encounter for immunization: Secondary | ICD-10-CM

## 2023-04-03 DIAGNOSIS — Z113 Encounter for screening for infections with a predominantly sexual mode of transmission: Secondary | ICD-10-CM | POA: Insufficient documentation

## 2023-04-03 DIAGNOSIS — Z0000 Encounter for general adult medical examination without abnormal findings: Secondary | ICD-10-CM | POA: Insufficient documentation

## 2023-04-03 DIAGNOSIS — Z79899 Other long term (current) drug therapy: Secondary | ICD-10-CM | POA: Insufficient documentation

## 2023-04-03 DIAGNOSIS — B20 Human immunodeficiency virus [HIV] disease: Secondary | ICD-10-CM | POA: Diagnosis not present

## 2023-04-03 MED ORDER — TRIUMEQ 600-50-300 MG PO TABS: 1.0000 | ORAL_TABLET | Freq: Every day | ORAL | 11 refills | Status: DC

## 2023-04-03 NOTE — Progress Notes (Signed)
[address], [place], [place], [address]                                                                  Phn. [phone]; Fax: [phone]                                                                             Date: 04/03/23 Reason for Visit: Routine HIV care.  HPI: Jeffrey [name] is a 77 y.o.old male with a history of HIV ( previously on Truvada and [name]> stribild since  March 2014 for simplification of ART regimen>switched to Triumeq in 09/02/2013 due  to increasing Cr which he has been on until now), Syphilis s/p treatment, CAD, CKD, COPD, GERD, HTN, CVA/TIA s/p RT sided weakness, COVID 19  who is here for regular HIV follow up. Patient has been previously followed by Dr Orvan Falconer for a long time who has retired.   Last seen on 03/07/2022. Well controlled on Triumeq Lab Results  Component Value Date   HIV1RNAQUANT Not Detected 03/20/2023   Lab Results  Component Value Date   CD4TABS 501 03/20/2023   CD4TABS 499 02/20/2022   CD4TABS 396 (L) 02/17/2021   Interval hx/current visit: Taking triumeq, denies missing doses or any barriers to adherence of treatment. Denies smoking, alcohol and IVDU. Colonoscopy done in 10/2022. Wants to get flu and covid vaccine. He follows up with family practice and [facility] COPD. He wants to see dentist. O complaints otherwise.   ROS: As stated in above HPI; all other systems were reviewed and are otherwise negative unless noted below  No reported fever / chills, night sweats, unintentional weight loss, acute visual change, odynophagia, chest pain/pressure, new or worsened SOB or WOB, nausea, vomiting, diarrhea, dysuria, GU discharge, syncope, seizures, red/hot swollen joints, hallucinations / delusions, rashes, new allergies, unusual /  excessive bleeding, swollen lymph nodes, or new hospitalizations/ED visits/Urgent Care visits since the pt was last seen.  PMH/ PSH/ FamHx / Social Hx , medications and allergies reviewed and updated as appropriate; please see corresponding tab in EHR / prior notes                                        Current Outpatient Medications on File Prior to Visit  Medication Sig Dispense Refill   albuterol (VENTOLIN HFA) 108 (90 Base) MCG/ACT inhaler INHALE 2 PUFFS BY MOUTH EVERY 6 HOURS AS NEEDED FOR WHEEZING FOR SHORTNESS OF BREATH 9 g 2   Ascorbic Acid (VITAMIN C) 1000 MG tablet Take  1,000 mg by mouth daily at 6 PM.     aspirin EC 81 MG tablet Take 1 tablet (81 mg total) by mouth daily.     Cholecalciferol (VITAMIN D3) 125 MCG (5000 UT) TABS Take 5,000 Units by mouth daily at 6 PM.     clopidogrel (PLAVIX) 75 MG tablet Take 1 tablet by mouth once daily 90 tablet 2   finasteride (PROSCAR) 5 MG tablet Take 5 mg by mouth in the morning and at bedtime.     losartan (COZAAR) 50 MG tablet Take 50 mg by mouth daily. Started by his kidney doctor- Dr. Thedore Mins - [facility]     pantoprazole (PROTONIX) 20 MG tablet Take 1 tablet by mouth twice daily 180 tablet 0   rosuvastatin (CRESTOR) 20 MG tablet Take 1 tablet by mouth once daily 90 tablet 3   solifenacin (VESICARE) 10 MG tablet Take by mouth.     STIOLTO RESPIMAT 2.5-2.5 MCG/ACT AERS INHALE 2 PUFFS BY MOUTH ONCE DAILY 4 g 0   sucralfate (CARAFATE) 1 GM/10ML suspension Take 10 mLs (1 g total) by mouth every 6 (six) hours as needed. 420 mL 1   vitamin B-12 (CYANOCOBALAMIN) 500 MCG tablet Take 500 mcg by mouth daily.     Zinc 50 MG TABS Take 50 mg by mouth daily at 6 PM.     sildenafil (REVATIO) 20 MG tablet Take 20 mg by mouth as needed. (Patient not taking: Reported on 02/21/2023)     No current facility-administered medications on file prior to visit.     Allergies  Allergen Reactions   Baclofen Other (See Comments)    Back pain. Patient said it  made him feel crazy    Efavirenz Rash   Nevirapine Rash and Other (See Comments)    Past Medical History:  Diagnosis Date   CAD (coronary artery disease)    Chronic kidney disease    Colon polyps    COPD (chronic obstructive pulmonary disease) (HCC)    COVID-19    05/2019   GERD (gastroesophageal reflux disease)    Heart murmur    History of kidney stones    HIV (human immunodeficiency virus infection) (HCC) 1999   Hypertension    Pneumonia    Stroke (HCC)    right side weakness   TIA (transient ischemic attack)    Past Surgical History:  Procedure Laterality Date   CATARACT EXTRACTION W/ INTRAOCULAR LENS IMPLANT Bilateral    CHOLECYSTECTOMY  1982   COLONOSCOPY     ESOPHAGEAL MANOMETRY N/A 09/03/2017   Procedure: ESOPHAGEAL MANOMETRY (EM);  Surgeon: Napoleon Form, MD;  Location: [facility];  Service: Endoscopy;  Laterality: N/A;   IR ANGIO INTRA EXTRACRAN SEL COM CAROTID INNOMINATE BILAT MOD SED  04/21/2019   IR ANGIO INTRA EXTRACRAN SEL COM CAROTID INNOMINATE BILAT MOD SED  05/11/2020   IR ANGIO VERTEBRAL SEL VERTEBRAL BILAT MOD SED  04/21/2019   IR ANGIO VERTEBRAL SEL VERTEBRAL BILAT MOD SED  05/11/2020   IR PTA INTRACRANIAL  05/14/2019   IR US GUIDE VASC ACCESS RIGHT  05/11/2020   KNEE SURGERY     LEFT KNEE   RADIOLOGY WITH ANESTHESIA N/A 05/14/2019   Procedure: IR WITH ANESTHESIA STENTING;  Surgeon: Julieanne Cotton, MD;  Location: [facility];  Service: Radiology;  Laterality: N/A;   TONSILLECTOMY     Social History   Socioeconomic History   Marital status: Divorced    Spouse name: Not on file   Number of children: 2  Years of education: 46   Highest education level: Some college, no degree  Occupational History   Occupation: retired    Associate Professor: [employer]  Tobacco Use   Smoking status: Never   Smokeless tobacco: Former    Types: Chew    Quit date: 2019  Vaping Use   Vaping status: Never Used  Substance and Sexual Activity   Alcohol use: Yes     Comment: rare - few per year   Drug use: No   Sexual activity: Not Currently    Partners: Female    Comment: declined condoms  Other Topics Concern   Not on file  Social History Narrative   Lives alone   Right handed    Caffeine use: tea, mainly water      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, English as a foreign language   2 children - 1 boy and 1 girl   Social Determinants of Health   Financial Resource Strain: Medium Risk (07/03/2022)   Overall Financial Resource Strain (CARDIA)    Difficulty of Paying Living Expenses: Somewhat hard  Food Insecurity: No Food Insecurity (07/03/2022)   Hunger Vital Sign    Worried About Running Out of Food in the Last Year: Never true    Ran Out of Food in the Last Year: Never true  Transportation Needs: No Transportation Needs (07/03/2022)   PRAPARE - Administrator, Civil Service (Medical): No    Lack of Transportation (Non-Medical): No  Physical Activity: Sufficiently Active (07/03/2022)   Exercise Vital Sign    Days of Exercise per Week: 7 days    Minutes of Exercise per Session: 60 min  Stress: No Stress Concern Present (07/03/2022)   [employer] of Occupational Health - Occupational Stress Questionnaire    Feeling of Stress : Not at all  Social Connections: Socially Isolated (07/03/2022)   Social Connection and Isolation Panel [NHANES]    Frequency of Communication with Friends and Family: More than three times a week    Frequency of Social Gatherings with Friends and Family: Once a week    Attends Religious Services: Never    Database administrator or Organizations: Patient declined    Attends Banker Meetings: Never    Marital Status: Divorced  Catering manager Violence: Not At Risk (07/03/2022)   Humiliation, Afraid, Rape, and Kick questionnaire    Fear of Current or Ex-Partner: No    Emotionally Abused: No    Physically Abused: No    Sexually Abused: No   Family History  Problem Relation Age of Onset   Hypertension Mother     Heart attack Mother 33   Alzheimer's disease Sister    Heart disease Sister    Alzheimer's disease Sister    Drug abuse Sister    COPD Brother    Kidney cancer Brother    Colon cancer Neg Hx    Esophageal cancer Neg Hx    Pancreatic cancer Neg Hx    Stomach cancer Neg Hx    Liver disease Neg Hx    Vitals  BP 131/83   Pulse 77   Resp 16   Ht 5' 7.5" (1.715 m)   Wt 174 lb 4.8 oz (79.1 kg)   SpO2 98%   BMI 26.90 kg/m    Examination  Gen: no acute distress HEENT: [place]/AT, no scleral icterus, no pale conjunctivae, hearing normal, oral mucosa moist Neck: Supple Cardio: Regular rate and rhythm Resp: Pulmonary effort normal in room air GI: nondistended  GU: Musc: Extremities: No pedal edema Skin: No rashes Neuro: grossly non focal , awake, alert and oriented * 3  Psych: Calm, cooperative    Lab Results HIV 1 RNA Quant (Copies/mL)  Date Value  03/20/2023 Not Detected  02/20/2022 Not Detected  02/17/2021 35 (H)   CD4 T Cell Abs (/uL)  Date Value  03/20/2023 501  02/20/2022 499  02/17/2021 396 (L)   No results found for: "HIV1GENOSEQ" Lab Results  Component Value Date   WBC 5.7 03/20/2023   HGB 13.7 03/20/2023   HCT 41.3 03/20/2023   MCV 101.7 (H) 03/20/2023   PLT 161 03/20/2023    Lab Results  Component Value Date   CREATININE 1.62 (H) 03/20/2023   BUN 22 03/20/2023   NA 140 03/20/2023   K 4.3 03/20/2023   CL 105 03/20/2023   CO2 26 03/20/2023   Lab Results  Component Value Date   ALT 15 03/20/2023   AST 14 03/20/2023   ALKPHOS 63 09/06/2020   BILITOT 0.3 03/20/2023    Lab Results  Component Value Date   CHOL 131 03/20/2023   TRIG 168 (H) 03/20/2023   HDL 48 03/20/2023   LDLCALC 58 03/20/2023   No results found for: "HAV" Lab Results  Component Value Date   HEPBSAG No 07/02/2006   HEPBSAB No 07/02/2006   Lab Results  Component Value Date   HCVAB No 07/02/2006   Lab Results  Component Value Date   CHLAMYDIAWP Negative 03/20/2023   N  Negative 03/20/2023   No results found for: "GCPROBEAPT" No results found for: "QUANTGOLD"  Pathology of colonoscopy 10/24/22 Diagnosis 1. Surgical [P], colon, transverse, polyp (1) - TUBULAR ADENOMA - NEGATIVE FOR HIGH-GRADE DYSPLASIA OR MALIGNANCY 2. Surgical [P], colon, recto-sigmoid erosions - FOCALLY ACTIVE COLITIS WITH EROSION/ULCERATION, FIBROSIS, REACTIVE/REGENERATIVE TYPE CHANGES, AND ASSOCIATED MILD ARCHITECTURAL DISTORTION - NEGATIVE FOR GRANULOMATA, DYSPLASIA OR MALIGNANCY - SEE NOTE Diagnosis Note 2. No definitive features of chronicity are identified. The histologic features are non-specific and can be seen in low grade ischemia, medication effect, self-limited infectious processes and inflammatory bowel disease. Clinical-pathologic correlation is recommended.  Health Maintenance: Immunization History  Administered Date(s) Administered   Fluad Quad(high Dose 65+) 03/07/2022   H1N1 09/22/2008   Hepatitis B 09/24/2001, 11/07/2001, 05/26/2002   Influenza Whole 02/20/2006, 03/21/2007, 03/31/2008, 03/08/2009, 01/26/2010, 12/28/2010   Influenza,inj,Quad PF,6+ Mos 04/10/2013, 01/27/2014, 01/21/2015, 01/20/2016, 01/23/2017, 02/07/2018, 01/21/2019, 01/31/2021   Influenza-Unspecified 03/13/2013   Moderna Sars-Covid-2 Vaccination 09/17/2019, 10/15/2019   PFIZER Comirnaty(Gray Top)Covid-19 Tri-Sucrose Vaccine 05/20/2021   Pfizer(Comirnaty)Fall Seasonal Vaccine 12 years and older 03/07/2022   Pneumococcal Conjugate-13 01/06/2015   Pneumococcal Polysaccharide-23 09/22/2008, 08/07/2013   Tdap 01/06/2015   Assessment/Plan: # HIV - Continue Triumeq - labs from 11/12 discussed - he would like to keep 6 months visit rather than yearly and will plan accordingly. Future lab orders placed to be done 2 weeks prior to OV.  # STD Screening  - no acute concerns  - 11/12 urine GC and RPR negative  #Immunization  - Flu and covid 19 vaccine today   #Health maintenance - Dental  referral placed - Colonoscopy done in 10/24/22, no need for further colonoscopies.  Patient's labs were reviewed as well as his previous records. Patients questions were addressed and answered. Safe sex counseling done.   I have personally spent 60 minutes involved in face-to-face and non-face-to-face activities for this patient on the day of the visit. Professional time spent includes the following activities: Preparing to see the patient (  review of tests), Obtaining and/or reviewing separately obtained history (admission/discharge record), Performing a medically appropriate examination and/or evaluation , Ordering medications/tests/procedures, referring and communicating with other health care professionals, Documenting clinical information in the EMR, Independently interpreting results (not separately reported), Communicating results to the patient/family/caregiver, Counseling and educating the patient/family/caregiver and Care coordination (not separately reported).   Of note, portions of this note may have been created with voice recognition software. While this note has been edited for accuracy, occasional wrong-word or 'sound-a-like' substitutions may have occurred due to the inherent limitations of voice recognition software.   Electronically signed by:  Odette Fraction, MD Infectious Disease Physician [facility] [address] [place], [place] [address] Phone: [phone]  Fax: [phone]

## 2023-04-06 DIAGNOSIS — Z23 Encounter for immunization: Secondary | ICD-10-CM | POA: Insufficient documentation

## 2023-04-17 ENCOUNTER — Ambulatory Visit (INDEPENDENT_AMBULATORY_CARE_PROVIDER_SITE_OTHER): Payer: Medicare HMO | Admitting: Neurology

## 2023-04-17 ENCOUNTER — Encounter: Payer: Self-pay | Admitting: Neurology

## 2023-04-17 VITALS — BP 125/83 | HR 79 | Ht 69.0 in | Wt 171.8 lb

## 2023-04-17 DIAGNOSIS — M542 Cervicalgia: Secondary | ICD-10-CM

## 2023-04-17 DIAGNOSIS — G4489 Other headache syndrome: Secondary | ICD-10-CM

## 2023-04-17 DIAGNOSIS — M5481 Occipital neuralgia: Secondary | ICD-10-CM

## 2023-04-17 DIAGNOSIS — B20 Human immunodeficiency virus [HIV] disease: Secondary | ICD-10-CM | POA: Diagnosis not present

## 2023-04-17 DIAGNOSIS — I6521 Occlusion and stenosis of right carotid artery: Secondary | ICD-10-CM

## 2023-04-17 NOTE — Progress Notes (Addendum)
[facility]  PATIENT: Jeffrey [name] DOB: 1945-11-27  REFERRING DOCTOR OR PCP: Frederick Peers, MD; Orpah Cobb (PCP) SOURCE: Patient, notes from primary care  _________________________________   HISTORICAL  CHIEF COMPLAINT:  Chief Complaint  Patient presents with   Follow-up    Pt in room 10 alone. Here for occipital neuralgia follow up. Pt reports doing okay.     HISTORY OF PRESENT ILLNESS:  Jeffrey [name] is a 77 - year-old man with HA.  Update 04/17/2023. He reports HA has increased some but is not as bad as last year.   Pain is in the occiput, right > left.   He denies stiffness.    Pain radiates up the back of the head.     When pain was more severe we did splenius capitus/occipitla nerve injections/blocks and they helped x 5 months    He has mildly reduced gait since his strokes.    MRI/MRA 09/30/2021 showed no occlusion or new stenosis but he has chronic multifocal aterosclerotic narrowing.    No acute findings in brain.     He is still on ASA and plavix   He denies new stroke like symptoms.    Vision is better after cataract surgery.  This clears up over the next hour.    He had balloon angioplasty supraclinoid ICA 05/14/2019.  An MRA 10/30/2019 showed 60% paraclinoid segment stenosis of the right ICA and moderate stenosis of the M2 segment of the right MCA and left MCA, and moderate stenosis of the left PCA.     CT scan 12/16/19 showed mild SVID and expanded Betsy Coder Robbin space next to the basal ganglia on the left   He is HIV positive.  He has stage 3 chronic renal disease - this is stable.   Creatinine is 1.54.     REVIEW OF SYSTEMS: Constitutional: No fevers, chills, sweats, or change in appetite Eyes: No visual changes, double vision, eye pain Ear, nose and throat: No hearing loss, ear pain, nasal congestion, sore throat Cardiovascular: No chest pain, palpitations Respiratory:  No shortness of breath at rest or with exertion.   No  wheezes GastrointestinaI: No nausea, vomiting, diarrhea, abdominal pain, fecal incontinence Genitourinary:  No dysuria, urinary retention or frequency.  No nocturia. Musculoskeletal:  No neck pain, back pain Integumentary: No rash, pruritus, skin lesions Neurological: as above Psychiatric: No depression at this time.  No anxiety Endocrine: No palpitations, diaphoresis, change in appetite, change in weigh or increased thirst Hematologic/Lymphatic:  No anemia, purpura, petechiae. Allergic/Immunologic: No itchy/runny eyes, nasal congestion, recent allergic reactions, rashes  ALLERGIES: Allergies  Allergen Reactions   Baclofen Other (See Comments)    Back pain. Patient said it made him feel crazy    Efavirenz Rash   Nevirapine Rash and Other (See Comments)    HOME MEDICATIONS:  Current Outpatient Medications:    abacavir-dolutegravir-lamiVUDine (TRIUMEQ) 600-50-300 MG tablet, Take 1 tablet by mouth daily., Disp: 30 tablet, Rfl: 11   albuterol (VENTOLIN HFA) 108 (90 Base) MCG/ACT inhaler, INHALE 2 PUFFS BY MOUTH EVERY 6 HOURS AS NEEDED FOR WHEEZING FOR SHORTNESS OF BREATH, Disp: 9 g, Rfl: 2   Ascorbic Acid (VITAMIN C) 1000 MG tablet, Take 1,000 mg by mouth daily at 6 PM., Disp: , Rfl:    aspirin EC 81 MG tablet, Take 1 tablet (81 mg total) by mouth daily., Disp: , Rfl:    Cholecalciferol (VITAMIN D3) 125 MCG (5000 UT) TABS, Take 5,000 Units by mouth daily at 6 PM., Disp: ,  Rfl:    clopidogrel (PLAVIX) 75 MG tablet, Take 1 tablet by mouth once daily, Disp: 90 tablet, Rfl: 2   finasteride (PROSCAR) 5 MG tablet, Take 5 mg by mouth in the morning and at bedtime., Disp: , Rfl:    losartan (COZAAR) 50 MG tablet, Take 50 mg by mouth daily. Started by his kidney doctor- Dr. Thedore Mins - [facility], Disp: , Rfl:    pantoprazole (PROTONIX) 20 MG tablet, Take 1 tablet by mouth twice daily, Disp: 180 tablet, Rfl: 0   rosuvastatin (CRESTOR) 20 MG tablet, Take 1 tablet by mouth once daily, Disp: 90  tablet, Rfl: 3   sildenafil (REVATIO) 20 MG tablet, Take 20 mg by mouth as needed., Disp: , Rfl:    solifenacin (VESICARE) 10 MG tablet, Take by mouth., Disp: , Rfl:    STIOLTO RESPIMAT 2.5-2.5 MCG/ACT AERS, INHALE 2 PUFFS BY MOUTH ONCE DAILY, Disp: 4 g, Rfl: 0   sucralfate (CARAFATE) 1 GM/10ML suspension, Take 10 mLs (1 g total) by mouth every 6 (six) hours as needed., Disp: 420 mL, Rfl: 1   vitamin B-12 (CYANOCOBALAMIN) 500 MCG tablet, Take 500 mcg by mouth daily., Disp: , Rfl:    Zinc 50 MG TABS, Take 50 mg by mouth daily at 6 PM., Disp: , Rfl:   PAST MEDICAL HISTORY: Past Medical History:  Diagnosis Date   CAD (coronary artery disease)    Chronic kidney disease    Colon polyps    COPD (chronic obstructive pulmonary disease) (HCC)    COVID-19    05/2019   GERD (gastroesophageal reflux disease)    Heart murmur    History of kidney stones    HIV (human immunodeficiency virus infection) (HCC) 1999   Hypertension    Pneumonia    Stroke (HCC)    right side weakness   TIA (transient ischemic attack)     PAST SURGICAL HISTORY: Past Surgical History:  Procedure Laterality Date   CATARACT EXTRACTION W/ INTRAOCULAR LENS IMPLANT Bilateral    CHOLECYSTECTOMY  1982   COLONOSCOPY     ESOPHAGEAL MANOMETRY N/A 09/03/2017   Procedure: ESOPHAGEAL MANOMETRY (EM);  Surgeon: Napoleon Form, MD;  Location: [facility];  Service: Endoscopy;  Laterality: N/A;   IR ANGIO INTRA EXTRACRAN SEL COM CAROTID INNOMINATE BILAT MOD SED  04/21/2019   IR ANGIO INTRA EXTRACRAN SEL COM CAROTID INNOMINATE BILAT MOD SED  05/11/2020   IR ANGIO VERTEBRAL SEL VERTEBRAL BILAT MOD SED  04/21/2019   IR ANGIO VERTEBRAL SEL VERTEBRAL BILAT MOD SED  05/11/2020   IR PTA INTRACRANIAL  05/14/2019   IR US GUIDE VASC ACCESS RIGHT  05/11/2020   KNEE SURGERY     LEFT KNEE   RADIOLOGY WITH ANESTHESIA N/A 05/14/2019   Procedure: IR WITH ANESTHESIA STENTING;  Surgeon: Julieanne Cotton, MD;  Location: [facility] OR;  Service:  Radiology;  Laterality: N/A;   TONSILLECTOMY      FAMILY HISTORY: Family History  Problem Relation Age of Onset   Hypertension Mother    Heart attack Mother 84   Alzheimer's disease Sister    Heart disease Sister    Alzheimer's disease Sister    Drug abuse Sister    COPD Brother    Kidney cancer Brother    Colon cancer Neg Hx    Esophageal cancer Neg Hx    Pancreatic cancer Neg Hx    Stomach cancer Neg Hx    Liver disease Neg Hx     SOCIAL HISTORY:  Social History  Socioeconomic History   Marital status: Divorced    Spouse name: Not on file   Number of children: 2   Years of education: 14   Highest education level: Some college, no degree  Occupational History   Occupation: retired    Associate Professor: [employer]  Tobacco Use   Smoking status: Never   Smokeless tobacco: Former    Types: Chew    Quit date: 2019  Vaping Use   Vaping status: Never Used  Substance and Sexual Activity   Alcohol use: Yes    Comment: rare - few per year   Drug use: No   Sexual activity: Not Currently    Partners: Female    Comment: declined condoms  Other Topics Concern   Not on file  Social History Narrative   Lives alone   Right handed    Caffeine use: tea, mainly water      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, English as a foreign language   2 children - 1 boy and 1 girl   Social Determinants of Health   Financial Resource Strain: Medium Risk (07/03/2022)   Overall Financial Resource Strain (CARDIA)    Difficulty of Paying Living Expenses: Somewhat hard  Food Insecurity: No Food Insecurity (07/03/2022)   Hunger Vital Sign    Worried About Running Out of Food in the Last Year: Never true    Ran Out of Food in the Last Year: Never true  Transportation Needs: No Transportation Needs (07/03/2022)   PRAPARE - Administrator, Civil Service (Medical): No    Lack of Transportation (Non-Medical): No  Physical Activity: Sufficiently Active (07/03/2022)   Exercise Vital Sign    Days of Exercise per Week:  7 days    Minutes of Exercise per Session: 60 min  Stress: No Stress Concern Present (07/03/2022)   [employer] of Occupational Health - Occupational Stress Questionnaire    Feeling of Stress : Not at all  Social Connections: Socially Isolated (07/03/2022)   Social Connection and Isolation Panel [NHANES]    Frequency of Communication with Friends and Family: More than three times a week    Frequency of Social Gatherings with Friends and Family: Once a week    Attends Religious Services: Never    Database administrator or Organizations: Patient declined    Attends Banker Meetings: Never    Marital Status: Divorced  Catering manager Violence: Not At Risk (07/03/2022)   Humiliation, Afraid, Rape, and Kick questionnaire    Fear of Current or Ex-Partner: No    Emotionally Abused: No    Physically Abused: No    Sexually Abused: No     PHYSICAL EXAM  Vitals:   04/17/23 1420 04/17/23 1425  BP: (!) 150/93 125/83  Pulse: 79   Weight: 171 lb 12.8 oz (77.9 kg)   Height: 5\' 9"  (1.753 m)     Body mass index is 25.37 kg/m.   General: The patient is well-developed and well-nourished and in no acute distress  HEENT:  Head is [place]/AT.     Neck: The neck has only minimal tenderness over the right occipital nerve..  Range of motion was mildly reduced...    Neurologic Exam  Mental status: The patient is alert and oriented x 3 at the time of the examination. The patient has apparent normal recent and remote memory, with an apparently normal attention span and concentration ability.   Speech is normal.  Cranial nerves: Extraocular movements are full.   Facial  symmetry is present.  Facial strength and sensation was normal.  No obvious hearing deficits are noted.  Motor:  Muscle bulk is normal.   Tone is normal. Strength is  5 / 5 in all 4 extremities.   Sensory: Intact to vibration and touch x4.  Coordination: Cerebellar testing reveals good finger-nose-finger and  heel-to-shin bilaterally.  Gait and station: Station is normal.   Gait is slightly wide. Tandem gait is wide. Romberg is negative.   Reflexes: Deep tendon reflexes are symmetric and normal bilaterally.  Marland Kitchen    DIAGNOSTIC DATA (LABS, IMAGING, TESTING) - I reviewed patient records, labs, notes, testing and imaging myself where available.  Lab Results  Component Value Date   WBC 5.7 03/20/2023   HGB 13.7 03/20/2023   HCT 41.3 03/20/2023   MCV 101.7 (H) 03/20/2023   PLT 161 03/20/2023      Component Value Date/Time   NA 140 03/20/2023 0930   NA 141 09/07/2021 1631   K 4.3 03/20/2023 0930   CL 105 03/20/2023 0930   CO2 26 03/20/2023 0930   GLUCOSE 101 (H) 03/20/2023 0930   BUN 22 03/20/2023 0930   BUN 24 09/07/2021 1631   CREATININE 1.62 (H) 03/20/2023 0930   CALCIUM 9.5 03/20/2023 0930   PROT 7.0 03/20/2023 0930   PROT 6.4 01/26/2020 0805   ALBUMIN 2.5 (L) 09/06/2020 1150   ALBUMIN 4.3 01/26/2020 0805   AST 14 03/20/2023 0930   ALT 15 03/20/2023 0930   ALKPHOS 63 09/06/2020 1150   BILITOT 0.3 03/20/2023 0930   BILITOT 0.4 01/26/2020 0805   GFRNONAA 57 (L) 09/09/2020 0130   GFRNONAA 39 (L) 10/16/2014 1421   GFRAA >60 12/16/2019 1826   GFRAA 45 (L) 10/16/2014 1421   Lab Results  Component Value Date   CHOL 131 03/20/2023   HDL 48 03/20/2023   LDLCALC 58 03/20/2023   TRIG 168 (H) 03/20/2023   CHOLHDL 2.7 03/20/2023   Lab Results  Component Value Date   HGBA1C 5.5 09/21/2020        ASSESSMENT AND PLAN  Occipital neuralgia of right side  Other headache syndrome  Human immunodeficiency virus (HIV) disease (HCC)  Neck pain  Internal carotid artery stenosis, right    1.  Headaches are tolerable.    I discussed with him that if pain flares up again we could repeat the occipital nerve block/trigger point injection or a tricyclic. 2.  Stay active and exercise as tolerated. 3.   Continue Plavix and aspirin for stroke prophylaxis.    He sees Dr. Corliss Skains for  the intracranial stenosis 4.   RTC 12 months or as needed.      This visit is part of a comprehensive longitudinal care medical relationship regarding the patients primary diagnosis of headaches and related concerns.   Merla Sawka A. Epimenio Foot, MD, [facility] 04/17/2023, 2:45 PM Certified in Neurology, Clinical Neurophysiology, Sleep Medicine and Neuroimaging  [facility] [address], [address] [place], [place] [address] [phone]

## 2023-04-17 NOTE — Addendum Note (Signed)
Addended by: Asa Lente on: 04/17/2023 04:53 PM   Modules accepted: Level of Service

## 2023-04-25 ENCOUNTER — Other Ambulatory Visit: Payer: Self-pay | Admitting: Emergency Medicine

## 2023-05-06 ENCOUNTER — Other Ambulatory Visit: Payer: Self-pay | Admitting: Nurse Practitioner

## 2023-05-14 DIAGNOSIS — N1831 Chronic kidney disease, stage 3a: Secondary | ICD-10-CM | POA: Diagnosis not present

## 2023-05-23 DIAGNOSIS — D631 Anemia in chronic kidney disease: Secondary | ICD-10-CM | POA: Diagnosis not present

## 2023-05-23 DIAGNOSIS — R809 Proteinuria, unspecified: Secondary | ICD-10-CM | POA: Diagnosis not present

## 2023-05-23 DIAGNOSIS — N1831 Chronic kidney disease, stage 3a: Secondary | ICD-10-CM | POA: Diagnosis not present

## 2023-05-23 DIAGNOSIS — I129 Hypertensive chronic kidney disease with stage 1 through stage 4 chronic kidney disease, or unspecified chronic kidney disease: Secondary | ICD-10-CM | POA: Diagnosis not present

## 2023-05-23 DIAGNOSIS — N2581 Secondary hyperparathyroidism of renal origin: Secondary | ICD-10-CM | POA: Diagnosis not present

## 2023-06-04 ENCOUNTER — Encounter: Payer: Self-pay | Admitting: Family Medicine

## 2023-06-04 ENCOUNTER — Ambulatory Visit: Payer: Medicare HMO | Admitting: Family Medicine

## 2023-06-04 VITALS — BP 123/85 | HR 85 | Ht 69.0 in | Wt 174.2 lb

## 2023-06-04 DIAGNOSIS — M25552 Pain in left hip: Secondary | ICD-10-CM

## 2023-06-04 DIAGNOSIS — M25562 Pain in left knee: Secondary | ICD-10-CM | POA: Diagnosis not present

## 2023-06-04 DIAGNOSIS — G8929 Other chronic pain: Secondary | ICD-10-CM

## 2023-06-04 NOTE — Progress Notes (Signed)
    SUBJECTIVE:   CHIEF COMPLAINT / HPI:   Issues with left knee and hip x a few months Pain primarily with walking up steps and flexion of knee Taking extra strength tylenol with relief No recent injury Reports he had a left knee procedure (arthroscopy?) in 1989 but since then had not had issues up until a few months ago  PERTINENT  PMH / PSH: Hypertension, CHF, CAD, CKD  OBJECTIVE:   BP 123/85   Pulse 85   Ht 5\' 9"  (1.753 m)   Wt 174 lb 4 oz (79 kg)   SpO2 97%   BMI 25.73 kg/m   General: NAD, pleasant, able to participate in exam Respiratory: No respiratory distress Skin: warm and dry, no rashes noted Psych: Normal affect and mood  L knee  Inspection: No gross deformity, ecchymosis, swelling Palpation: Slightly tender palpation of the medial joint line ROM: Full range of motion without pain or locking Strength: 5/5 Special tests: Slight medial pain with Apley's.  Negative McMurray, negative varus/valgus, negative drawers  L hip Inspection: No gross deformity, ecchymosis Palpation: Nontender to palpation throughout hip and greater trochanter ROM: Full range of motion without pain Strength: 5/5 Special tests: Pain with FABER/FADIR  ASSESSMENT/PLAN:   Assessment & Plan Chronic pain of left knee Suspect OA - obtain radiographs. Continue tylenol PRN.  Consider PT versus sports med referral based on results Left hip pain Suspect OA - obtain radiographs. Continue tylenol PRN.  Consider PT versus sports med referral based on results   Vonna Drafts, MD [facility]

## 2023-06-04 NOTE — Patient Instructions (Signed)
Please go across the street to [facility] to obtain x-rays of your hip and knee  You can continue using Tylenol as needed for the pain

## 2023-06-05 ENCOUNTER — Other Ambulatory Visit: Payer: Self-pay | Admitting: Family Medicine

## 2023-06-05 ENCOUNTER — Ambulatory Visit (HOSPITAL_COMMUNITY)
Admission: RE | Admit: 2023-06-05 | Discharge: 2023-06-05 | Disposition: A | Discharge status: Home / Self Care | Payer: Medicare HMO | Source: Ambulatory Visit | Attending: Family Medicine | Admitting: Family Medicine

## 2023-06-05 DIAGNOSIS — G8929 Other chronic pain: Secondary | ICD-10-CM | POA: Insufficient documentation

## 2023-06-05 DIAGNOSIS — M1612 Unilateral primary osteoarthritis, left hip: Secondary | ICD-10-CM | POA: Diagnosis not present

## 2023-06-05 DIAGNOSIS — M25562 Pain in left knee: Secondary | ICD-10-CM | POA: Insufficient documentation

## 2023-06-05 DIAGNOSIS — M48061 Spinal stenosis, lumbar region without neurogenic claudication: Secondary | ICD-10-CM | POA: Diagnosis not present

## 2023-06-05 DIAGNOSIS — I878 Other specified disorders of veins: Secondary | ICD-10-CM | POA: Diagnosis not present

## 2023-06-05 DIAGNOSIS — M25552 Pain in left hip: Secondary | ICD-10-CM

## 2023-06-05 DIAGNOSIS — M25561 Pain in right knee: Secondary | ICD-10-CM | POA: Diagnosis not present

## 2023-06-06 ENCOUNTER — Telehealth: Payer: Self-pay | Admitting: Family Medicine

## 2023-06-06 DIAGNOSIS — G8929 Other chronic pain: Secondary | ICD-10-CM

## 2023-06-06 DIAGNOSIS — M160 Bilateral primary osteoarthritis of hip: Secondary | ICD-10-CM

## 2023-06-06 NOTE — Telephone Encounter (Signed)
Called patient to discuss x-ray results showing hip OA, possible CAM impingement, and knee degenerative changes  Upon shared decision making we will place referral for PT as well as sports medicine for further evaluation and monitoring/management  Vonna Drafts, MD

## 2023-06-07 ENCOUNTER — Other Ambulatory Visit: Payer: Self-pay | Admitting: Emergency Medicine

## 2023-06-12 ENCOUNTER — Ambulatory Visit (INDEPENDENT_AMBULATORY_CARE_PROVIDER_SITE_OTHER): Payer: Medicare HMO | Admitting: Family Medicine

## 2023-06-12 VITALS — BP 124/82 | Ht 70.0 in | Wt 174.0 lb

## 2023-06-12 DIAGNOSIS — M1712 Unilateral primary osteoarthritis, left knee: Secondary | ICD-10-CM

## 2023-06-12 DIAGNOSIS — M1611 Unilateral primary osteoarthritis, right hip: Secondary | ICD-10-CM | POA: Diagnosis not present

## 2023-06-12 DIAGNOSIS — M1612 Unilateral primary osteoarthritis, left hip: Secondary | ICD-10-CM

## 2023-06-12 DIAGNOSIS — M160 Bilateral primary osteoarthritis of hip: Secondary | ICD-10-CM

## 2023-06-12 DIAGNOSIS — N1831 Chronic kidney disease, stage 3a: Secondary | ICD-10-CM | POA: Diagnosis not present

## 2023-06-12 NOTE — Progress Notes (Signed)
 DATE OF VISIT: 06/12/2023    Jeffrey [name] DOB: [birth year] MRN: [id number]  CC:  Lt knee and Lt hip pain  History- Jeffrey [name] is a 78 y.o. male for evaluation and treatment of left knee and left hip pain Referred by family medicine-Dr. Romelle -Notes from 06/04/2023 with Dr. Romelle reviewed during visit today -Noted be having pain for several months and using Tylenol  as needed with relief -Was referred for x-rays of his knee and hip, results as noted below -Was referred to PT, currently try to get this scheduled  Patient complains of both left knee and left hip pain.  He is most bothered by the left knee at this time Lt knee feels unstable Denies any swelling Does note some clicking and popping at times Hx of knee arthroscopy in 1989 No prior injections Occ tylenol  prn Can't take NSAIDs due to kidney disease Tried using a brace or a sleeve  He has some associated left hip pain. Occasionally bothersome when he is walking. Denies any radiation of the pain Denies any numbness or tingling Tylenol  has been helpful Tries to stay as active as possible   Past Medical History Past Medical History:  Diagnosis Date   CAD (coronary artery disease)    Chronic kidney disease    Colon polyps    COPD (chronic obstructive pulmonary disease) (HCC)    COVID-19    05/2019   GERD (gastroesophageal reflux disease)    Heart murmur    History of kidney stones    HIV (human immunodeficiency virus infection) (HCC) 1999   Hypertension    Pneumonia    Stroke (HCC)    right side weakness   TIA (transient ischemic attack)     Past Surgical History Past Surgical History:  Procedure Laterality Date   CATARACT EXTRACTION W/ INTRAOCULAR LENS IMPLANT Bilateral    CHOLECYSTECTOMY  1982   COLONOSCOPY     ESOPHAGEAL MANOMETRY N/A 09/03/2017   Procedure: ESOPHAGEAL MANOMETRY (EM);  Surgeon: Shila Gustav LULLA, MD;  Location: [facility];  Service: Endoscopy;  Laterality: N/A;   IR ANGIO INTRA  EXTRACRAN SEL COM CAROTID INNOMINATE BILAT MOD SED  04/21/2019   IR ANGIO INTRA EXTRACRAN SEL COM CAROTID INNOMINATE BILAT MOD SED  05/11/2020   IR ANGIO VERTEBRAL SEL VERTEBRAL BILAT MOD SED  04/21/2019   IR ANGIO VERTEBRAL SEL VERTEBRAL BILAT MOD SED  05/11/2020   IR PTA INTRACRANIAL  05/14/2019   IR US  GUIDE VASC ACCESS RIGHT  05/11/2020   KNEE SURGERY     LEFT KNEE   RADIOLOGY WITH ANESTHESIA N/A 05/14/2019   Procedure: IR WITH ANESTHESIA STENTING;  Surgeon: Dolphus Carrion, MD;  Location: [facility] OR;  Service: Radiology;  Laterality: N/A;   TONSILLECTOMY      Medications Current Outpatient Medications  Medication Sig Dispense Refill   abacavir -dolutegravir -lamiVUDine  (TRIUMEQ ) 600-50-300 MG tablet Take 1 tablet by mouth daily. 30 tablet 11   albuterol  (VENTOLIN  HFA) 108 (90 Base) MCG/ACT inhaler INHALE 2 PUFFS BY MOUTH EVERY 6 HOURS AS NEEDED FOR WHEEZING FOR SHORTNESS OF BREATH 9 g 2   Ascorbic Acid (VITAMIN C) 1000 MG tablet Take 1,000 mg by mouth daily at 6 PM.     aspirin  EC 81 MG tablet Take 1 tablet (81 mg total) by mouth daily.     Cholecalciferol (VITAMIN D3) 125 MCG (5000 UT) TABS Take 5,000 Units by mouth daily at 6 PM.     clopidogrel  (PLAVIX ) 75 MG tablet Take 1 tablet by mouth once  daily 90 tablet 2   finasteride  (PROSCAR ) 5 MG tablet Take 5 mg by mouth in the morning and at bedtime.     losartan (COZAAR) 50 MG tablet Take 50 mg by mouth daily. Started by his kidney doctor- Dr. Dennise - [facility]     pantoprazole  (PROTONIX ) 20 MG tablet Take 1 tablet by mouth twice daily 180 tablet 2   rosuvastatin  (CRESTOR ) 20 MG tablet Take 1 tablet by mouth once daily 90 tablet 3   sildenafil (REVATIO) 20 MG tablet Take 20 mg by mouth as needed.     solifenacin (VESICARE) 10 MG tablet Take by mouth.     STIOLTO RESPIMAT  2.5-2.5 MCG/ACT AERS INHALE 2 PUFFS BY MOUTH ONCE DAILY 4 g 0   sucralfate  (CARAFATE ) 1 GM/10ML suspension Take 10 mLs (1 g total) by mouth every 6 (six) hours as  needed. 420 mL 1   vitamin B-12 (CYANOCOBALAMIN) 500 MCG tablet Take 500 mcg by mouth daily.     Zinc 50 MG TABS Take 50 mg by mouth daily at 6 PM.     No current facility-administered medications for this visit.    Allergies is allergic to baclofen , efavirenz , and nevirapine.  Family History - reviewed per EMR and intake form  Social History   reports current alcohol use.  reports that he has never smoked. He quit smokeless tobacco use about 6 years ago.  His smokeless tobacco use included chew.  reports no history of drug use.   EXAM: Vitals: BP 124/82   Ht 5' 10 (1.778 m)   Wt 174 lb (78.9 kg)   BMI 24.97 kg/m  General: AOx3, NAD, pleasant SKIN: no rashes or lesions, skin clean, dry, intact MSK: Knee: Left knee without any gross deformity.  Full range of motion with some pain at terminal flexion.  Does have slight fullness in the popliteal fossa medially which feels consistent with a small Baker's cyst.  No increased redness or warmth.  He has mild medial joint line tenderness, no lateral joint line tenderness.  Some pain with McMurray, negative Lachman, negative varus/valgus stress test. Right knee with full range of motion without pain, weakness, instability  Hip: Left hip with good range of motion with slight pain at terminal flexion.  Negative FABER and FADIR.  Negative logroll.  Hip strength 5 -/5 throughout.  With full range of motion without pain, weakness, instability  Walking without a limp  NEURO: sensation intact to light touch lower extremity bilaterally VASC: pulses 2+ and symmetric DP/PT bilaterally, no edema  IMAGING: XRAYS:  Right hip x-ray and left hip x-ray 2 views 06/05/23 with pelvis personally reviewed and interpreted by me today showing: -Mild to moderate right osteoarthritis of the hip joint -Mild left osteoarthritis of the hip joint -CAM-type FAI bilaterally, more prominent on the right compared to the left  Left knee x-ray 3 views, along with  bilateral knee standing AP views 06/05/2023 personally reviewed and interpreted by me today showing: -Left knee with mild patellofemoral spurring consistent with mild osteoarthritis.  Some of these arthropathic changes at the quadriceps insertion on the patella as well.  No other new abnormalities -Single view of the right knee shows no acute abnormalities  Assessment & Plan Primary osteoarthritis of left knee Left knee pain with x-ray showing mild patellofemoral osteoarthritis.  He does have history of arthroscopy in 1989.  Suspect likely has underlying degenerative meniscus tear as well  Plan: -Visit notes with PCP and prior imaging reviewed as noted above -  Patient unable to take NSAIDs due to chronic kidney disease.  Can continue Tylenol  as needed.  He is only needing to use this a few days a week -Recommend he purchase a knee sleeve to wear with activity.  Offered him body helix in the office today, but he prefers to purchase over-the-counter on his own. -Agree that he would benefit from physical therapy.  Already has referral from PCP office.  He will proceed with this -Discussed role of possible cortisone injections in the future.  If he has worsening symptoms could consider this. -Follow-up with us  as needed Primary osteoarthritis of left hip Left hip pain with x-ray showing mild osteoarthritis and associated FAI, cam type which is likely contributing to his symptoms.  Plan: -Visit notes with PCP and prior imaging reviewed as noted above -Patient unable to take NSAIDs due to chronic kidney disease.  Can continue Tylenol  as needed.  He is only needing to use this a few days a week -Agree that he would benefit from physical therapy.  Already has referral from PCP office.  He will proceed with this -Discussed role of possible cortisone injections in the future.  If he has worsening symptoms could consider this.  Did advise that based on arthritis and underlying FAI that definitive treatment  would be hip replacement in the future.  He is not a candidate for this at this time, but if symptoms significantly worsen could consider referral to orthopedist. -Follow-up with us  as needed Primary osteoarthritis of right hip Hip x-ray showing mild to moderate osteoarthritis and associated FAI.  Right hip has not been symptomatic  Plan: -X-ray findings reviewed with patient as noted above.  He is not symptomatic on this side. -Should proceed with physical therapy as recommended by PCP.  This will hopefully prevent him from developing any pain on the side. -Follow-up with us  as needed Stage 3a chronic kidney disease (HCC) Unable to take NSAIDs due to CKD  Plan: -Can continue Tylenol  Extra Strength 1-2 tabs every 6-8 hours as needed  Patient expressed understanding & agreement with above.  Encounter Diagnoses  Name Primary?   Primary osteoarthritis of left knee Yes   Primary osteoarthritis of left hip    Primary osteoarthritis of right hip    Stage 3a chronic kidney disease (HCC)     No orders of the defined types were placed in this encounter.   No orders of the defined types were placed in this encounter.

## 2023-06-13 ENCOUNTER — Encounter: Payer: Self-pay | Admitting: Family Medicine

## 2023-06-13 NOTE — Assessment & Plan Note (Signed)
Unable to take NSAIDs due to CKD  Plan: -Can continue Tylenol Extra Strength 1-2 tabs every 6-8 hours as needed

## 2023-06-26 ENCOUNTER — Other Ambulatory Visit (HOSPITAL_COMMUNITY): Payer: Self-pay | Admitting: Interventional Radiology

## 2023-06-26 DIAGNOSIS — I771 Stricture of artery: Secondary | ICD-10-CM

## 2023-06-29 ENCOUNTER — Other Ambulatory Visit: Payer: Self-pay

## 2023-06-29 ENCOUNTER — Ambulatory Visit: Payer: Medicare HMO | Attending: Family Medicine

## 2023-06-29 DIAGNOSIS — M25562 Pain in left knee: Secondary | ICD-10-CM | POA: Diagnosis not present

## 2023-06-29 DIAGNOSIS — G8929 Other chronic pain: Secondary | ICD-10-CM | POA: Diagnosis not present

## 2023-06-29 DIAGNOSIS — M160 Bilateral primary osteoarthritis of hip: Secondary | ICD-10-CM | POA: Insufficient documentation

## 2023-06-29 DIAGNOSIS — M25561 Pain in right knee: Secondary | ICD-10-CM | POA: Insufficient documentation

## 2023-06-29 DIAGNOSIS — M25551 Pain in right hip: Secondary | ICD-10-CM | POA: Insufficient documentation

## 2023-06-29 DIAGNOSIS — M25552 Pain in left hip: Secondary | ICD-10-CM | POA: Diagnosis not present

## 2023-06-29 NOTE — Therapy (Signed)
 OUTPATIENT PHYSICAL THERAPY EVALUATION   Patient Name: Jeffrey [name] MRN: [id number] DOB:03-14-1946, 78 y.o., male Today's Date: 06/29/2023  END OF SESSION:  Visit Number 1 Number of Visits 10 Date for PT re-eval 08/10/2023  Authorization Type [name] MCR/MCD  Progress Note due on visit # 10  PT start time 1305 PT stop time 1340 PT time calculation (min) 35 min   Past Medical History:  Diagnosis Date   CAD (coronary artery disease)    Chronic kidney disease    Colon polyps    COPD (chronic obstructive pulmonary disease) (HCC)    COVID-19    05/2019   GERD (gastroesophageal reflux disease)    Heart murmur    History of kidney stones    HIV (human immunodeficiency virus infection) (HCC) 1999   Hypertension    Pneumonia    Stroke (HCC)    right side weakness   TIA (transient ischemic attack)    Past Surgical History:  Procedure Laterality Date   CATARACT EXTRACTION W/ INTRAOCULAR LENS IMPLANT Bilateral    CHOLECYSTECTOMY  1982   COLONOSCOPY     ESOPHAGEAL MANOMETRY N/A 09/03/2017   Procedure: ESOPHAGEAL MANOMETRY (EM);  Surgeon: Napoleon Form, MD;  Location: [facility];  Service: Endoscopy;  Laterality: N/A;   IR ANGIO INTRA EXTRACRAN SEL COM CAROTID INNOMINATE BILAT MOD SED  04/21/2019   IR ANGIO INTRA EXTRACRAN SEL COM CAROTID INNOMINATE BILAT MOD SED  05/11/2020   IR ANGIO VERTEBRAL SEL VERTEBRAL BILAT MOD SED  04/21/2019   IR ANGIO VERTEBRAL SEL VERTEBRAL BILAT MOD SED  05/11/2020   IR PTA INTRACRANIAL  05/14/2019   IR US GUIDE VASC ACCESS RIGHT  05/11/2020   KNEE SURGERY     LEFT KNEE   RADIOLOGY WITH ANESTHESIA N/A 05/14/2019   Procedure: IR WITH ANESTHESIA STENTING;  Surgeon: Julieanne Cotton, MD;  Location: [facility];  Service: Radiology;  Laterality: N/A;   TONSILLECTOMY     Patient Active Problem List   Diagnosis Date Noted   Encounter for immunization 04/06/2023   Medication management 04/03/2023   Health care maintenance 04/03/2023    Screening for STDs (sexually transmitted diseases) 04/03/2023   COPD (chronic obstructive pulmonary disease) (HCC) 08/24/2022   Abnormal CT of the chest 08/24/2022   Dizziness 09/07/2021   Blurry vision, bilateral 09/07/2021   Bilateral occipital neuralgia 04/06/2021   Gynecomastia 02/28/2021   Proteinuria 09/23/2020   Nocturnal polyuria 09/21/2020   Meralgia paresthetica, left lower limb 09/21/2020   Dehydration    Infectious colitis    Rib pain 07/13/2020   Syphilis (acquired) 06/22/2020   Rectal abnormality 05/27/2020   Neck pain 12/23/2019   Other headache syndrome 12/23/2019   Occipital neuralgia of right side 12/23/2019   CAD (coronary artery disease) 10/27/2019   Internal carotid artery stenosis, right 05/14/2019   Peripheral vertigo 11/29/2018   Chronic kidney disease 12/20/2017   Diastolic CHF with preserved left ventricular function, NYHA class 2 (HCC) 01/06/2015   Dyspnea on exertion 09/30/2014   Insomnia 07/22/2014   BPH with obstruction/lower urinary tract symptoms 06/17/2014   Hyperlipidemia 07/22/2012   Erectile dysfunction 07/22/2012   GERD 05/17/2010   Dysphagia 03/08/2009   Enlarged lymph nodes 09/19/2007   CHOLECYSTECTOMY, HX OF 05/28/2006   Human immunodeficiency virus (HIV) disease (HCC) 02/27/2006   Essential hypertension 02/27/2006   History of respiratory system disease 02/27/2006   ARTHROSCOPY, KNEE, HX OF 02/27/2006    PCP: Levin Erp, MD  REFERRING PROVIDER: Nestor Ramp, MD  REFERRING DIAG: Osteoarthritis of both hips, unspecified osteoarthritis type [M16.0], Chronic pain of left knee [M25.562, G89.29]   Rationale for Evaluation and Treatment: Rehabilitation  THERAPY DIAG:  Pain of both hip joints  Chronic pain of both knees  ONSET DATE: 06/06/2023 date of referral   SUBJECTIVE:                                                                                                                                                                                            SUBJECTIVE STATEMENT: Patient reports to PT with L>R hip and knee pain that is worse depending on weather, prolonged walking for grocery stopping, and otherwise hard for patient to identify the pattern.   PERTINENT HISTORY:  Relevant PMHx includes CAD, CKD, COPD, HIV, HTN, Stroke with subsequent right sided weakness, TIA, hx of knee arthroscopy    PAIN:  Are you having pain?  Yes: NPRS scale: 0/10 at best, 10/10 at worst Pain location: left >right hip, left knee  Pain description: unable  Aggravating factors: prolonged standing/walking, weather Relieving factors: rest  PRECAUTIONS: None  RED FLAGS: None   WEIGHT BEARING RESTRICTIONS: No  FALLS:  Has patient fallen in last 6 months? No  LIVING ENVIRONMENT: Lives with: lives alone Lives in: House/apartment Stairs: No Has following equipment at home: None  OCCUPATION: Retired   PLOF: Independent with basic ADLs and Independent with community mobility without device  PATIENT GOALS: "I just want to stay active as long/much as I can."   NEXT MD VISIT: 07/30/23 with family medicine   OBJECTIVE:  Note: Objective measures were completed at Evaluation unless otherwise noted.  DIAGNOSTIC FINDINGS:   As found in office note from referring provider:   XRAYS:  Right hip x-ray and left hip x-ray 2 views 06/05/23 with pelvis personally reviewed and interpreted by me today showing: -Mild to moderate right osteoarthritis of the hip joint -Mild left osteoarthritis of the hip joint -CAM-type FAI bilaterally, more prominent on the right compared to the left   Left knee x-ray 3 views, along with bilateral knee standing AP views 06/05/2023 personally reviewed and interpreted by me today showing: -Left knee with mild patellofemoral spurring consistent with mild osteoarthritis.  Some of these arthropathic changes at the quadriceps insertion on the patella as well.  No other new abnormalities -Single view of  the right knee shows no acute abnormalities    PATIENT SURVEYS:  LEFS 33/80 = 41% perceived function Most difficulty with getting in/out of vehicles, prolonged walking, going up and down stairs, rolling over in bed  COGNITION: Overall cognitive status: Within functional limits for tasks assessed  SENSATION: Not tested   POSTURE: rounded shoulders, forward head, and decreased lumbar lordosis   LUMBAR ROM:  grossly 75% of full AROM in all directions   AROM eval  Flexion   Extension   Right lateral flexion   Left lateral flexion   Right rotation   Left rotation    (Blank rows = not tested)  LOWER EXTREMITY ROM:     Active  Right eval Left eval  Hip flexion    Hip extension    Hip abduction    Hip adduction    Hip internal rotation    Hip external rotation    Knee flexion    Knee extension    Ankle dorsiflexion    Ankle plantarflexion    Ankle inversion    Ankle eversion     (Blank rows = not tested)  LOWER EXTREMITY MMT:    MMT Right eval Left eval  Hip flexion 5 5  Hip extension    Hip abduction 4+ 5  Hip adduction    Hip internal rotation    Hip external rotation    Knee flexion    Knee extension 5 5  Ankle dorsiflexion    Ankle plantarflexion    Ankle inversion    Ankle eversion     (Blank rows = not tested)   LOWER EXTREMITY SPECIAL TESTS:  Hip special tests: Luisa Hart (FABER) test: negative and Anterior hip impingement test: negative     TREATMENT DATE:   [facility] Adult PT Treatment:                                                DATE: 06/29/2023   Initial evaluation: see patient education and home exercise program as noted below                                                                                                                                   PATIENT EDUCATION:  Education details: reviewed initial home exercise program; discussion of POC, prognosis and goals for skilled PT   Person educated: Patient Education method:  Explanation, Demonstration, and Handouts Education comprehension: verbalized understanding, returned demonstration, and needs further education  HOME EXERCISE PROGRAM: Access Code: [email or web address] URL: [email or web address] Date: 07/01/2023 Prepared by: Mauri Reading  Exercises - Supine Lower Trunk Rotation  - 1 x daily - 7 x weekly - 2 sets - 10 reps - 3 sec hold - Supine ITB Stretch with Strap  - 1 x daily - 7 x weekly - 2 sets - 5 reps - 15-20 sec hold  ASSESSMENT:  CLINICAL IMPRESSION: Arlene is a 78 y.o. male  who was seen today for physical therapy evaluation and treatment for Left hip and knee pain. His pain is likely related to mild-to-moderate L hip and knee OA. He has  negative results for FAI special test at time of evaluation. He has related pain and difficulty with prolonged standing, walking, car transfers and otherwise decreased activity tolerance d/t pain. He requires skilled PT services at this time to address relevant deficits and improve overall function.      OBJECTIVE IMPAIRMENTS: decreased activity tolerance, decreased strength, improper body mechanics, and pain.   ACTIVITY LIMITATIONS: lifting, standing, stairs, transfers, and bed mobility  PARTICIPATION LIMITATIONS: cleaning, shopping, and community activity  PERSONAL FACTORS: Age, Time since onset of injury/illness/exacerbation, and 3+ comorbidities: Relevant PMHx includes CAD, CKD, COPD, HIV, HTN, Stroke with subsequent right sided weakness, TIA, hx of knee arthroscopy   are also affecting patient's functional outcome.   REHAB POTENTIAL: Fair  CLINICAL DECISION MAKING: Evolving/moderate complexity  EVALUATION COMPLEXITY: Moderate   GOALS: Goals reviewed with patient? Yes  SHORT TERM GOALS: Target date: 07/20/2023   Patient will be independent with initial home program for hip mobility and LE stretches.  Baseline: provided at eval  Goal status: INITIAL   LONG TERM GOALS: Target date:  08/10/2023   Patient will report improved overall functional ability with LEFS score of 50 or greater  Baseline: 33/80 Goal status: INITIAL  2.  Patient will demonstrate ability to perform standing and walking activities >30 minutes without exacerbation of symptoms.  Baseline: poor tolerance Goal status: INITIAL  3.  Patient will report ability to perform car transfers with proper body mechanics and no more than 3/10 LE pain.  Baseline: >5/10 pain, moderate-to-severe difficulty  Goal status: INITIAL  4.  Patient will demonstrate ability to perform bed mobility activities with proper body mechanics and no more then 3/10 LE pain.  Baseline: >5/10 pain, moderate-to-severe difficulty  Goal status: INITIAL   PLAN:  PT FREQUENCY: 1-2x/week  PT DURATION: 6 weeks  PLANNED INTERVENTIONS: 97164- PT Re-evaluation, 97110-Therapeutic exercises, 97530- Therapeutic activity, 97112- Neuromuscular re-education, 97535- Self Care, 81191- Manual therapy, [phone]- Aquatic Therapy, [id number]- Electrical stimulation (unattended), Patient/Family education, Taping, Dry Needling, Joint mobilization, Spinal mobilization, Cryotherapy, and Moist heat.  PLAN FOR NEXT SESSION: LE mobility, aerobic activities, isometric strengthening, PNF strengthening, joint distraction/mob, bed mobility training, car transfer training  Mauri Reading, PT, DPT  07/01/2023 5:09 PM   Referring diagnosis? Osteoarthritis of both hips, unspecified osteoarthritis type [M16.0], Chronic pain of left knee [M25.562, G89.29]   Treatment diagnosis? (if different than referring diagnosis)  Pain of both hip joints Chronic pain of both knees  What was this (referring dx) caused by? []  Surgery []  Fall [x]  Ongoing issue [x]  Arthritis []  Other: ____________  Laterality: []  Rt []  Lt [x]  Both  Check all possible CPT codes:  *CHOOSE 10 OR LESS*    See Planned Interventions listed in the Plan section of the Evaluation.

## 2023-07-02 ENCOUNTER — Telehealth (HOSPITAL_COMMUNITY): Payer: Self-pay

## 2023-07-02 NOTE — Telephone Encounter (Signed)
 Called to reschedule consult, no answer, left vm. AB

## 2023-07-04 ENCOUNTER — Ambulatory Visit (HOSPITAL_COMMUNITY): Payer: Medicare HMO

## 2023-07-05 ENCOUNTER — Other Ambulatory Visit: Payer: Self-pay | Admitting: Emergency Medicine

## 2023-07-06 ENCOUNTER — Ambulatory Visit (HOSPITAL_COMMUNITY)
Admission: RE | Admit: 2023-07-06 | Discharge: 2023-07-06 | Disposition: A | Discharge status: Home / Self Care | Payer: Medicare HMO | Source: Ambulatory Visit | Attending: Interventional Radiology | Admitting: Interventional Radiology

## 2023-07-06 ENCOUNTER — Telehealth (HOSPITAL_COMMUNITY): Payer: Self-pay | Admitting: Radiology

## 2023-07-06 NOTE — Telephone Encounter (Signed)
 Per Dr. Corliss Skains, ok for patient to wait one year for his follow-up scan. I spoke with the patient this morning and he is NOT having any stroke like or concerning symptoms at this time and his scans have remained stable. We will contact him in August for a repeat MRA head wo and office visit for insurance approval of the scan. Patient agrees with this plan of care and will call us sooner if he notices any new issues that concern him. JM

## 2023-07-11 ENCOUNTER — Ambulatory Visit: Payer: Medicare HMO | Attending: Family Medicine | Admitting: Physical Therapy

## 2023-07-11 ENCOUNTER — Encounter: Payer: Self-pay | Admitting: Physical Therapy

## 2023-07-11 DIAGNOSIS — G8929 Other chronic pain: Secondary | ICD-10-CM | POA: Diagnosis not present

## 2023-07-11 DIAGNOSIS — M25561 Pain in right knee: Secondary | ICD-10-CM | POA: Diagnosis not present

## 2023-07-11 DIAGNOSIS — M25551 Pain in right hip: Secondary | ICD-10-CM | POA: Diagnosis not present

## 2023-07-11 DIAGNOSIS — M25562 Pain in left knee: Secondary | ICD-10-CM | POA: Diagnosis not present

## 2023-07-11 DIAGNOSIS — M25552 Pain in left hip: Secondary | ICD-10-CM | POA: Insufficient documentation

## 2023-07-11 NOTE — Therapy (Signed)
 OUTPATIENT PHYSICAL THERAPY EVALUATION   Patient Name: Jeffrey [name] MRN: [id number] DOB:03-08-1946, 78 y.o., male Today's Date: 07/11/2023  END OF SESSION:  PT End of Session - 07/11/23 1259     Visit Number 2    Number of Visits 10    Date for PT Re-Evaluation 08/10/23    Authorization Type [name] MCR/MCD    Progress Note Due on Visit 10    PT Start Time 1300    PT Stop Time 1341    PT Time Calculation (min) 41 min              Past Medical History:  Diagnosis Date   CAD (coronary artery disease)    Chronic kidney disease    Colon polyps    COPD (chronic obstructive pulmonary disease) (HCC)    COVID-19    05/2019   GERD (gastroesophageal reflux disease)    Heart murmur    History of kidney stones    HIV (human immunodeficiency virus infection) (HCC) 1999   Hypertension    Pneumonia    Stroke (HCC)    right side weakness   TIA (transient ischemic attack)    Past Surgical History:  Procedure Laterality Date   CATARACT EXTRACTION W/ INTRAOCULAR LENS IMPLANT Bilateral    CHOLECYSTECTOMY  1982   COLONOSCOPY     ESOPHAGEAL MANOMETRY N/A 09/03/2017   Procedure: ESOPHAGEAL MANOMETRY (EM);  Surgeon: Napoleon Form, MD;  Location: [facility];  Service: Endoscopy;  Laterality: N/A;   IR ANGIO INTRA EXTRACRAN SEL COM CAROTID INNOMINATE BILAT MOD SED  04/21/2019   IR ANGIO INTRA EXTRACRAN SEL COM CAROTID INNOMINATE BILAT MOD SED  05/11/2020   IR ANGIO VERTEBRAL SEL VERTEBRAL BILAT MOD SED  04/21/2019   IR ANGIO VERTEBRAL SEL VERTEBRAL BILAT MOD SED  05/11/2020   IR PTA INTRACRANIAL  05/14/2019   IR US GUIDE VASC ACCESS RIGHT  05/11/2020   KNEE SURGERY     LEFT KNEE   RADIOLOGY WITH ANESTHESIA N/A 05/14/2019   Procedure: IR WITH ANESTHESIA STENTING;  Surgeon: Julieanne Cotton, MD;  Location: [facility];  Service: Radiology;  Laterality: N/A;   TONSILLECTOMY     Patient Active Problem List   Diagnosis Date Noted   Encounter for immunization 04/06/2023    Medication management 04/03/2023   Health care maintenance 04/03/2023   Screening for STDs (sexually transmitted diseases) 04/03/2023   COPD (chronic obstructive pulmonary disease) (HCC) 08/24/2022   Abnormal CT of the chest 08/24/2022   Dizziness 09/07/2021   Blurry vision, bilateral 09/07/2021   Bilateral occipital neuralgia 04/06/2021   Gynecomastia 02/28/2021   Proteinuria 09/23/2020   Nocturnal polyuria 09/21/2020   Meralgia paresthetica, left lower limb 09/21/2020   Dehydration    Infectious colitis    Rib pain 07/13/2020   Syphilis (acquired) 06/22/2020   Rectal abnormality 05/27/2020   Neck pain 12/23/2019   Other headache syndrome 12/23/2019   Occipital neuralgia of right side 12/23/2019   CAD (coronary artery disease) 10/27/2019   Internal carotid artery stenosis, right 05/14/2019   Peripheral vertigo 11/29/2018   Chronic kidney disease 12/20/2017   Diastolic CHF with preserved left ventricular function, NYHA class 2 (HCC) 01/06/2015   Dyspnea on exertion 09/30/2014   Insomnia 07/22/2014   BPH with obstruction/lower urinary tract symptoms 06/17/2014   Hyperlipidemia 07/22/2012   Erectile dysfunction 07/22/2012   GERD 05/17/2010   Dysphagia 03/08/2009   Enlarged lymph nodes 09/19/2007   CHOLECYSTECTOMY, HX OF 05/28/2006   Human immunodeficiency virus (HIV)  disease (HCC) 02/27/2006   Essential hypertension 02/27/2006   History of respiratory system disease 02/27/2006   ARTHROSCOPY, KNEE, HX OF 02/27/2006    PCP: Levin Erp, MD  REFERRING PROVIDER: Nestor Ramp, MD   REFERRING DIAG: Osteoarthritis of both hips, unspecified osteoarthritis type [M16.0], Chronic pain of left knee [M25.562, G89.29]   Rationale for Evaluation and Treatment: Rehabilitation  THERAPY DIAG:  Pain of both hip joints  Chronic pain of both knees  ONSET DATE: 06/06/2023 date of referral   SUBJECTIVE:                                                                                                                                                                                            SUBJECTIVE STATEMENT:  07/11/2023:  Pt reports that his low back has been hurting more since starting his HEP.  EVAL: Patient reports to PT with L>R hip and knee pain that is worse depending on weather, prolonged walking for grocery stopping, and otherwise hard for patient to identify the pattern.   PERTINENT HISTORY:  Relevant PMHx includes CAD, CKD, COPD, HIV, HTN, Stroke with subsequent right sided weakness, TIA, hx of knee arthroscopy    PAIN:  Are you having pain?  Yes: NPRS scale: 0/10 at best, 10/10 at worst Pain location: left >right hip, left knee  Pain description: unable  Aggravating factors: prolonged standing/walking, weather Relieving factors: rest  PRECAUTIONS: None  RED FLAGS: None   WEIGHT BEARING RESTRICTIONS: No  FALLS:  Has patient fallen in last 6 months? No  LIVING ENVIRONMENT: Lives with: lives alone Lives in: House/apartment Stairs: No Has following equipment at home: None  OCCUPATION: Retired   PLOF: Independent with basic ADLs and Independent with community mobility without device  PATIENT GOALS: "I just want to stay active as long/much as I can."   NEXT MD VISIT: 07/30/23 with family medicine   OBJECTIVE:  Note: Objective measures were completed at Evaluation unless otherwise noted.  DIAGNOSTIC FINDINGS:   As found in office note from referring provider:   XRAYS:  Right hip x-ray and left hip x-ray 2 views 06/05/23 with pelvis personally reviewed and interpreted by me today showing: -Mild to moderate right osteoarthritis of the hip joint -Mild left osteoarthritis of the hip joint -CAM-type FAI bilaterally, more prominent on the right compared to the left   Left knee x-ray 3 views, along with bilateral knee standing AP views 06/05/2023 personally reviewed and interpreted by me today showing: -Left knee with mild patellofemoral spurring  consistent with mild osteoarthritis.  Some of these arthropathic changes at the quadriceps insertion on the patella as well.  No other new abnormalities -Single view of the right knee shows no acute abnormalities    PATIENT SURVEYS:  LEFS 33/80 = 41% perceived function Most difficulty with getting in/out of vehicles, prolonged walking, going up and down stairs, rolling over in bed  COGNITION: Overall cognitive status: Within functional limits for tasks assessed     SENSATION: Not tested   POSTURE: rounded shoulders, forward head, and decreased lumbar lordosis   LUMBAR ROM:  grossly 75% of full AROM in all directions   AROM eval  Flexion   Extension   Right lateral flexion   Left lateral flexion   Right rotation   Left rotation    (Blank rows = not tested)  LOWER EXTREMITY ROM:     Active  Right eval Left eval  Hip flexion    Hip extension    Hip abduction    Hip adduction    Hip internal rotation    Hip external rotation    Knee flexion    Knee extension    Ankle dorsiflexion    Ankle plantarflexion    Ankle inversion    Ankle eversion     (Blank rows = not tested)  LOWER EXTREMITY MMT:    MMT Right eval Left eval  Hip flexion 5 5  Hip extension    Hip abduction 4+ 5  Hip adduction    Hip internal rotation    Hip external rotation    Knee flexion    Knee extension 5 5  Ankle dorsiflexion    Ankle plantarflexion    Ankle inversion    Ankle eversion     (Blank rows = not tested)   LOWER EXTREMITY SPECIAL TESTS:  Hip special tests: Luisa Hart (FABER) test: negative and Anterior hip impingement test: negative     TREATMENT DATE:   [facility] Adult PT Treatment  07/11/2023:  Therapeutic Exercise:  nu-step L5 53m while taking subjective and planning session with patient LTR Alternating clam - GTB - 2x10 ea Hip adduction squeeze - 2x10 5'' hold Bridge on ball - 2x10 - small arc SLR - small arc - 4x5 ea SAQ - 3x10 Quad set - 2x10 ea    HOME  EXERCISE PROGRAM: Access Code: [email or web address] URL: [email or web address] Date: 07/11/2023 Prepared by: Alphonzo Severance  Exercises - Supine Lower Trunk Rotation  - 1 x daily - 7 x weekly - 2 sets - 10 reps - 3 sec hold - Supine Quad Set  - 3 x daily - 7 x weekly - 2 sets - 10 reps - 10 second hold - Active Straight Leg Raise with Quad Set  - 1 x daily - 7 x weekly - 3 sets - 10 reps - Supine Bridge  - 1 x daily - 7 x weekly - 3 sets - 10 reps - 2-3 seconds hold  ASSESSMENT:  CLINICAL IMPRESSION:  07/11/2023: Pt reports to clinic with high levels of knee, hip, and low back pain.  We concentrated on introducing relatively low load strengthening exercises to good effect.  HEP updated to include some exercises performed today with concentration on quad and hip strengthening.   Eval: Lesley is a 78 y.o. male  who was seen today for physical therapy evaluation and treatment for Left hip and knee pain. His pain is likely related to mild-to-moderate L hip and knee OA. He has negative results for FAI special test at time of evaluation. He has related pain and difficulty with prolonged standing, walking, car transfers and otherwise decreased activity  tolerance d/t pain. He requires skilled PT services at this time to address relevant deficits and improve overall function.      OBJECTIVE IMPAIRMENTS: decreased activity tolerance, decreased strength, improper body mechanics, and pain.   ACTIVITY LIMITATIONS: lifting, standing, stairs, transfers, and bed mobility  PARTICIPATION LIMITATIONS: cleaning, shopping, and community activity  PERSONAL FACTORS: Age, Time since onset of injury/illness/exacerbation, and 3+ comorbidities: Relevant PMHx includes CAD, CKD, COPD, HIV, HTN, Stroke with subsequent right sided weakness, TIA, hx of knee arthroscopy   are also affecting patient's functional outcome.   REHAB POTENTIAL: Fair  CLINICAL DECISION MAKING: Evolving/moderate complexity  EVALUATION  COMPLEXITY: Moderate   GOALS: Goals reviewed with patient? Yes  SHORT TERM GOALS: Target date: 07/20/2023   Patient will be independent with initial home program for hip mobility and LE stretches.  Baseline: provided at eval  Goal status: INITIAL   LONG TERM GOALS: Target date: 08/10/2023   Patient will report improved overall functional ability with LEFS score of 50 or greater  Baseline: 33/80 Goal status: INITIAL  2.  Patient will demonstrate ability to perform standing and walking activities >30 minutes without exacerbation of symptoms.  Baseline: poor tolerance Goal status: INITIAL  3.  Patient will report ability to perform car transfers with proper body mechanics and no more than 3/10 LE pain.  Baseline: >5/10 pain, moderate-to-severe difficulty  Goal status: INITIAL  4.  Patient will demonstrate ability to perform bed mobility activities with proper body mechanics and no more then 3/10 LE pain.  Baseline: >5/10 pain, moderate-to-severe difficulty  Goal status: INITIAL   PLAN:  PT FREQUENCY: 1-2x/week  PT DURATION: 6 weeks  PLANNED INTERVENTIONS: 97164- PT Re-evaluation, 97110-Therapeutic exercises, 97530- Therapeutic activity, 97112- Neuromuscular re-education, 97535- Self Care, 40102- Manual therapy, [phone]- Aquatic Therapy, [id number]- Electrical stimulation (unattended), Patient/Family education, Taping, Dry Needling, Joint mobilization, Spinal mobilization, Cryotherapy, and Moist heat.  PLAN FOR NEXT SESSION: LE mobility, aerobic activities, isometric strengthening, PNF strengthening, joint distraction/mob, bed mobility training, car transfer training  Fredderick Phenix PT 07/11/2023 12:59 PM   Referring diagnosis? Osteoarthritis of both hips, unspecified osteoarthritis type [M16.0], Chronic pain of left knee [M25.562, G89.29]   Treatment diagnosis? (if different than referring diagnosis)  Pain of both hip joints Chronic pain of both knees  What was this  (referring dx) caused by? []  Surgery []  Fall [x]  Ongoing issue [x]  Arthritis []  Other: ____________  Laterality: []  Rt []  Lt [x]  Both  Check all possible CPT codes:  *CHOOSE 10 OR LESS*    See Planned Interventions listed in the Plan section of the Evaluation.

## 2023-07-13 ENCOUNTER — Ambulatory Visit: Payer: Medicare HMO

## 2023-07-13 DIAGNOSIS — M25562 Pain in left knee: Secondary | ICD-10-CM | POA: Diagnosis not present

## 2023-07-13 DIAGNOSIS — G8929 Other chronic pain: Secondary | ICD-10-CM | POA: Diagnosis not present

## 2023-07-13 DIAGNOSIS — M25552 Pain in left hip: Secondary | ICD-10-CM

## 2023-07-13 DIAGNOSIS — M25551 Pain in right hip: Secondary | ICD-10-CM | POA: Diagnosis not present

## 2023-07-13 DIAGNOSIS — M25561 Pain in right knee: Secondary | ICD-10-CM | POA: Diagnosis not present

## 2023-07-13 NOTE — Therapy (Signed)
 OUTPATIENT PHYSICAL THERAPY EVALUATION   Patient Name: Jeffrey [name] MRN: [id number] DOB:24-May-1945, 78 y.o., male Today's Date: 07/13/2023  END OF SESSION:  PT End of Session - 07/13/23 1222     Visit Number 3    Number of Visits 10    Date for PT Re-Evaluation 08/10/23    Authorization Type [name] MCR/MCD    Progress Note Due on Visit 10    PT Start Time 1220    PT Stop Time 1258    PT Time Calculation (min) 38 min    Activity Tolerance Patient tolerated treatment well    Behavior During Therapy WFL for tasks assessed/performed               Past Medical History:  Diagnosis Date   CAD (coronary artery disease)    Chronic kidney disease    Colon polyps    COPD (chronic obstructive pulmonary disease) (HCC)    COVID-19    05/2019   GERD (gastroesophageal reflux disease)    Heart murmur    History of kidney stones    HIV (human immunodeficiency virus infection) (HCC) 1999   Hypertension    Pneumonia    Stroke (HCC)    right side weakness   TIA (transient ischemic attack)    Past Surgical History:  Procedure Laterality Date   CATARACT EXTRACTION W/ INTRAOCULAR LENS IMPLANT Bilateral    CHOLECYSTECTOMY  1982   COLONOSCOPY     ESOPHAGEAL MANOMETRY N/A 09/03/2017   Procedure: ESOPHAGEAL MANOMETRY (EM);  Surgeon: Napoleon Form, MD;  Location: [facility];  Service: Endoscopy;  Laterality: N/A;   IR ANGIO INTRA EXTRACRAN SEL COM CAROTID INNOMINATE BILAT MOD SED  04/21/2019   IR ANGIO INTRA EXTRACRAN SEL COM CAROTID INNOMINATE BILAT MOD SED  05/11/2020   IR ANGIO VERTEBRAL SEL VERTEBRAL BILAT MOD SED  04/21/2019   IR ANGIO VERTEBRAL SEL VERTEBRAL BILAT MOD SED  05/11/2020   IR PTA INTRACRANIAL  05/14/2019   IR US GUIDE VASC ACCESS RIGHT  05/11/2020   KNEE SURGERY     LEFT KNEE   RADIOLOGY WITH ANESTHESIA N/A 05/14/2019   Procedure: IR WITH ANESTHESIA STENTING;  Surgeon: Julieanne Cotton, MD;  Location: [facility];  Service: Radiology;  Laterality: N/A;    TONSILLECTOMY     Patient Active Problem List   Diagnosis Date Noted   Encounter for immunization 04/06/2023   Medication management 04/03/2023   Health care maintenance 04/03/2023   Screening for STDs (sexually transmitted diseases) 04/03/2023   COPD (chronic obstructive pulmonary disease) (HCC) 08/24/2022   Abnormal CT of the chest 08/24/2022   Dizziness 09/07/2021   Blurry vision, bilateral 09/07/2021   Bilateral occipital neuralgia 04/06/2021   Gynecomastia 02/28/2021   Proteinuria 09/23/2020   Nocturnal polyuria 09/21/2020   Meralgia paresthetica, left lower limb 09/21/2020   Dehydration    Infectious colitis    Rib pain 07/13/2020   Syphilis (acquired) 06/22/2020   Rectal abnormality 05/27/2020   Neck pain 12/23/2019   Other headache syndrome 12/23/2019   Occipital neuralgia of right side 12/23/2019   CAD (coronary artery disease) 10/27/2019   Internal carotid artery stenosis, right 05/14/2019   Peripheral vertigo 11/29/2018   Chronic kidney disease 12/20/2017   Diastolic CHF with preserved left ventricular function, NYHA class 2 (HCC) 01/06/2015   Dyspnea on exertion 09/30/2014   Insomnia 07/22/2014   BPH with obstruction/lower urinary tract symptoms 06/17/2014   Hyperlipidemia 07/22/2012   Erectile dysfunction 07/22/2012   GERD 05/17/2010  Dysphagia 03/08/2009   Enlarged lymph nodes 09/19/2007   CHOLECYSTECTOMY, HX OF 05/28/2006   Human immunodeficiency virus (HIV) disease (HCC) 02/27/2006   Essential hypertension 02/27/2006   History of respiratory system disease 02/27/2006   ARTHROSCOPY, KNEE, HX OF 02/27/2006    PCP: Levin Erp, MD  REFERRING PROVIDER: Nestor Ramp, MD   REFERRING DIAG: Osteoarthritis of both hips, unspecified osteoarthritis type [M16.0], Chronic pain of left knee [M25.562, G89.29]   Rationale for Evaluation and Treatment: Rehabilitation  THERAPY DIAG:  Pain of both hip joints  Chronic pain of both knees  ONSET DATE:  06/06/2023 date of referral   SUBJECTIVE:                                                                                                                                                                                           SUBJECTIVE STATEMENT:  07/13/2023:  Patient report that he felt better after last session. He is not having any pain so far today.   EVAL: Patient reports to PT with L>R hip and knee pain that is worse depending on weather, prolonged walking for grocery stopping, and otherwise hard for patient to identify the pattern.   PERTINENT HISTORY:  Relevant PMHx includes CAD, CKD, COPD, HIV, HTN, Stroke with subsequent right sided weakness, TIA, hx of knee arthroscopy    PAIN:  Are you having pain?  Yes: NPRS scale: 0/10 at best, 10/10 at worst Pain location: left >right hip, left knee  Pain description: unable  Aggravating factors: prolonged standing/walking, weather Relieving factors: rest  PRECAUTIONS: None  RED FLAGS: None   WEIGHT BEARING RESTRICTIONS: No  FALLS:  Has patient fallen in last 6 months? No  LIVING ENVIRONMENT: Lives with: lives alone Lives in: House/apartment Stairs: No Has following equipment at home: None  OCCUPATION: Retired   PLOF: Independent with basic ADLs and Independent with community mobility without device  PATIENT GOALS: "I just want to stay active as long/much as I can."   NEXT MD VISIT: 07/30/23 with family medicine   OBJECTIVE:  Note: Objective measures were completed at Evaluation unless otherwise noted.  DIAGNOSTIC FINDINGS:   As found in office note from referring provider:   XRAYS:  Right hip x-ray and left hip x-ray 2 views 06/05/23 with pelvis personally reviewed and interpreted by me today showing: -Mild to moderate right osteoarthritis of the hip joint -Mild left osteoarthritis of the hip joint -CAM-type FAI bilaterally, more prominent on the right compared to the left   Left knee x-ray 3 views, along with  bilateral knee standing AP views 06/05/2023 personally reviewed and interpreted by me today showing: -  Left knee with mild patellofemoral spurring consistent with mild osteoarthritis.  Some of these arthropathic changes at the quadriceps insertion on the patella as well.  No other new abnormalities -Single view of the right knee shows no acute abnormalities    PATIENT SURVEYS:  LEFS 33/80 = 41% perceived function Most difficulty with getting in/out of vehicles, prolonged walking, going up and down stairs, rolling over in bed  COGNITION: Overall cognitive status: Within functional limits for tasks assessed     SENSATION: Not tested   POSTURE: rounded shoulders, forward head, and decreased lumbar lordosis   LUMBAR ROM:  grossly 75% of full AROM in all directions   AROM eval  Flexion   Extension   Right lateral flexion   Left lateral flexion   Right rotation   Left rotation    (Blank rows = not tested)  LOWER EXTREMITY ROM:     Active  Right eval Left eval  Hip flexion    Hip extension    Hip abduction    Hip adduction    Hip internal rotation    Hip external rotation    Knee flexion    Knee extension    Ankle dorsiflexion    Ankle plantarflexion    Ankle inversion    Ankle eversion     (Blank rows = not tested)  LOWER EXTREMITY MMT:    MMT Right eval Left eval  Hip flexion 5 5  Hip extension    Hip abduction 4+ 5  Hip adduction    Hip internal rotation    Hip external rotation    Knee flexion    Knee extension 5 5  Ankle dorsiflexion    Ankle plantarflexion    Ankle inversion    Ankle eversion     (Blank rows = not tested)   LOWER EXTREMITY SPECIAL TESTS:  Hip special tests: Luisa Hart (FABER) test: negative and Anterior hip impingement test: negative     TREATMENT DATE:   [facility] Adult PT Treatment  07/13/2023:  Therapeutic Exercise:  nu-step L5 75m while taking subjective and planning session with patient [facility] red physioball x 20  LTR on red  physioball, 2 x 5 each  Alternating clam - blue TB - 2x10 ea Hip adduction squeeze - 2x10 5'' hold Bridge on ball - 2x10 - small arc Supine marches with Blue TB x 15 each LE SLR - small arc - 2x5 ea, 2 # ankle weight  SAQ - 2x10, 2# ankle weight     HOME EXERCISE PROGRAM: Access Code: [email or web address] URL: [email or web address] Date: 07/11/2023 Prepared by: Alphonzo Severance  Exercises - Supine Lower Trunk Rotation  - 1 x daily - 7 x weekly - 2 sets - 10 reps - 3 sec hold - Supine Quad Set  - 3 x daily - 7 x weekly - 2 sets - 10 reps - 10 second hold - Active Straight Leg Raise with Quad Set  - 1 x daily - 7 x weekly - 3 sets - 10 reps - Supine Bridge  - 1 x daily - 7 x weekly - 3 sets - 10 reps - 2-3 seconds hold  ASSESSMENT:  CLINICAL IMPRESSION:  07/13/2023: Patient was able to tolerate gradual progression of LE strengthening program, including addition of ankle weight for quad strengthening. He reports no significant pain throughout today's session. We will continue to progress as tolerated in order to reach rehab goals.   Eval: Shadrach is a 78 y.o. male  who was seen today for  physical therapy evaluation and treatment for Left hip and knee pain. His pain is likely related to mild-to-moderate L hip and knee OA. He has negative results for FAI special test at time of evaluation. He has related pain and difficulty with prolonged standing, walking, car transfers and otherwise decreased activity tolerance d/t pain. He requires skilled PT services at this time to address relevant deficits and improve overall function.      OBJECTIVE IMPAIRMENTS: decreased activity tolerance, decreased strength, improper body mechanics, and pain.   ACTIVITY LIMITATIONS: lifting, standing, stairs, transfers, and bed mobility  PARTICIPATION LIMITATIONS: cleaning, shopping, and community activity  PERSONAL FACTORS: Age, Time since onset of injury/illness/exacerbation, and 3+ comorbidities: Relevant  PMHx includes CAD, CKD, COPD, HIV, HTN, Stroke with subsequent right sided weakness, TIA, hx of knee arthroscopy   are also affecting patient's functional outcome.   REHAB POTENTIAL: Fair  CLINICAL DECISION MAKING: Evolving/moderate complexity  EVALUATION COMPLEXITY: Moderate   GOALS: Goals reviewed with patient? Yes  SHORT TERM GOALS: Target date: 07/20/2023   Patient will be independent with initial home program for hip mobility and LE stretches.  Baseline: provided at eval  Goal status: INITIAL   LONG TERM GOALS: Target date: 08/10/2023   Patient will report improved overall functional ability with LEFS score of 50 or greater  Baseline: 33/80 Goal status: INITIAL  2.  Patient will demonstrate ability to perform standing and walking activities >30 minutes without exacerbation of symptoms.  Baseline: poor tolerance Goal status: INITIAL  3.  Patient will report ability to perform car transfers with proper body mechanics and no more than 3/10 LE pain.  Baseline: >5/10 pain, moderate-to-severe difficulty  Goal status: INITIAL  4.  Patient will demonstrate ability to perform bed mobility activities with proper body mechanics and no more then 3/10 LE pain.  Baseline: >5/10 pain, moderate-to-severe difficulty  Goal status: INITIAL   PLAN:  PT FREQUENCY: 1-2x/week  PT DURATION: 6 weeks  PLANNED INTERVENTIONS: 97164- PT Re-evaluation, 97110-Therapeutic exercises, 97530- Therapeutic activity, 97112- Neuromuscular re-education, 97535- Self Care, [id number]- Manual therapy, [id number]- Aquatic Therapy, [id number]- Electrical stimulation (unattended), Patient/Family education, Taping, Dry Needling, Joint mobilization, Spinal mobilization, Cryotherapy, and Moist heat.  PLAN FOR NEXT SESSION: LE mobility, aerobic activities, isometric strengthening, PNF strengthening, joint distraction/mob, bed mobility training, car transfer training  Mauri Reading, PT, DPT  07/13/2023 1:00 PM    Referring  diagnosis? Osteoarthritis of both hips, unspecified osteoarthritis type [M16.0], Chronic pain of left knee [M25.562, G89.29]   Treatment diagnosis? (if different than referring diagnosis)  Pain of both hip joints Chronic pain of both knees  What was this (referring dx) caused by? []  Surgery []  Fall [x]  Ongoing issue [x]  Arthritis []  Other: ____________  Laterality: []  Rt []  Lt [x]  Both  Check all possible CPT codes:  *CHOOSE 10 OR LESS*    See Planned Interventions listed in the Plan section of the Evaluation.

## 2023-07-17 ENCOUNTER — Ambulatory Visit: Payer: Medicare HMO

## 2023-07-17 DIAGNOSIS — G8929 Other chronic pain: Secondary | ICD-10-CM | POA: Diagnosis not present

## 2023-07-17 DIAGNOSIS — M25551 Pain in right hip: Secondary | ICD-10-CM | POA: Diagnosis not present

## 2023-07-17 DIAGNOSIS — M25561 Pain in right knee: Secondary | ICD-10-CM | POA: Diagnosis not present

## 2023-07-17 DIAGNOSIS — M25562 Pain in left knee: Secondary | ICD-10-CM | POA: Diagnosis not present

## 2023-07-17 DIAGNOSIS — M25552 Pain in left hip: Secondary | ICD-10-CM | POA: Diagnosis not present

## 2023-07-17 NOTE — Therapy (Signed)
 OUTPATIENT PHYSICAL THERAPY TREATMENT NOTE   Patient Name: Jeffrey [name] MRN: [id number] DOB:1945/05/28, 78 y.o., male Today's Date: 07/17/2023  END OF SESSION:  PT End of Session - 07/17/23 1233     Visit Number 4    Number of Visits 10    Date for PT Re-Evaluation 08/10/23    Authorization Type [name] MCR/MCD    Progress Note Due on Visit 10    PT Start Time 1220    PT Stop Time 1258    PT Time Calculation (min) 38 min    Activity Tolerance Patient tolerated treatment well    Behavior During Therapy WFL for tasks assessed/performed                Past Medical History:  Diagnosis Date   CAD (coronary artery disease)    Chronic kidney disease    Colon polyps    COPD (chronic obstructive pulmonary disease) (HCC)    COVID-19    05/2019   GERD (gastroesophageal reflux disease)    Heart murmur    History of kidney stones    HIV (human immunodeficiency virus infection) (HCC) 1999   Hypertension    Pneumonia    Stroke (HCC)    right side weakness   TIA (transient ischemic attack)    Past Surgical History:  Procedure Laterality Date   CATARACT EXTRACTION W/ INTRAOCULAR LENS IMPLANT Bilateral    CHOLECYSTECTOMY  1982   COLONOSCOPY     ESOPHAGEAL MANOMETRY N/A 09/03/2017   Procedure: ESOPHAGEAL MANOMETRY (EM);  Surgeon: Napoleon Form, MD;  Location: [facility];  Service: Endoscopy;  Laterality: N/A;   IR ANGIO INTRA EXTRACRAN SEL COM CAROTID INNOMINATE BILAT MOD SED  04/21/2019   IR ANGIO INTRA EXTRACRAN SEL COM CAROTID INNOMINATE BILAT MOD SED  05/11/2020   IR ANGIO VERTEBRAL SEL VERTEBRAL BILAT MOD SED  04/21/2019   IR ANGIO VERTEBRAL SEL VERTEBRAL BILAT MOD SED  05/11/2020   IR PTA INTRACRANIAL  05/14/2019   IR US GUIDE VASC ACCESS RIGHT  05/11/2020   KNEE SURGERY     LEFT KNEE   RADIOLOGY WITH ANESTHESIA N/A 05/14/2019   Procedure: IR WITH ANESTHESIA STENTING;  Surgeon: Julieanne Cotton, MD;  Location: [facility];  Service: Radiology;  Laterality: N/A;    TONSILLECTOMY     Patient Active Problem List   Diagnosis Date Noted   Encounter for immunization 04/06/2023   Medication management 04/03/2023   Health care maintenance 04/03/2023   Screening for STDs (sexually transmitted diseases) 04/03/2023   COPD (chronic obstructive pulmonary disease) (HCC) 08/24/2022   Abnormal CT of the chest 08/24/2022   Dizziness 09/07/2021   Blurry vision, bilateral 09/07/2021   Bilateral occipital neuralgia 04/06/2021   Gynecomastia 02/28/2021   Proteinuria 09/23/2020   Nocturnal polyuria 09/21/2020   Meralgia paresthetica, left lower limb 09/21/2020   Dehydration    Infectious colitis    Rib pain 07/13/2020   Syphilis (acquired) 06/22/2020   Rectal abnormality 05/27/2020   Neck pain 12/23/2019   Other headache syndrome 12/23/2019   Occipital neuralgia of right side 12/23/2019   CAD (coronary artery disease) 10/27/2019   Internal carotid artery stenosis, right 05/14/2019   Peripheral vertigo 11/29/2018   Chronic kidney disease 12/20/2017   Diastolic CHF with preserved left ventricular function, NYHA class 2 (HCC) 01/06/2015   Dyspnea on exertion 09/30/2014   Insomnia 07/22/2014   BPH with obstruction/lower urinary tract symptoms 06/17/2014   Hyperlipidemia 07/22/2012   Erectile dysfunction 07/22/2012   GERD 05/17/2010  Dysphagia 03/08/2009   Enlarged lymph nodes 09/19/2007   CHOLECYSTECTOMY, HX OF 05/28/2006   Human immunodeficiency virus (HIV) disease (HCC) 02/27/2006   Essential hypertension 02/27/2006   History of respiratory system disease 02/27/2006   ARTHROSCOPY, KNEE, HX OF 02/27/2006    PCP: Levin Erp, MD  REFERRING PROVIDER: Nestor Ramp, MD   REFERRING DIAG: Osteoarthritis of both hips, unspecified osteoarthritis type [M16.0], Chronic pain of left knee [M25.562, G89.29]   Rationale for Evaluation and Treatment: Rehabilitation  THERAPY DIAG:  Pain of both hip joints  Chronic pain of both knees  ONSET DATE:  06/06/2023 date of referral   SUBJECTIVE:                                                                                                                                                                                           SUBJECTIVE STATEMENT:  07/17/2023:  Patient reports that he is feeling okay today. He plans to get back to walking regularly once the weather warms up.   EVAL: Patient reports to PT with L>R hip and knee pain that is worse depending on weather, prolonged walking for grocery stopping, and otherwise hard for patient to identify the pattern.   PERTINENT HISTORY:  Relevant PMHx includes CAD, CKD, COPD, HIV, HTN, Stroke with subsequent right sided weakness, TIA, hx of knee arthroscopy    PAIN:  Are you having pain?  Yes: NPRS scale: 0/10 at best, 10/10 at worst Pain location: left >right hip, left knee  Pain description: unable  Aggravating factors: prolonged standing/walking, weather Relieving factors: rest  PRECAUTIONS: None  RED FLAGS: None   WEIGHT BEARING RESTRICTIONS: No  FALLS:  Has patient fallen in last 6 months? No  LIVING ENVIRONMENT: Lives with: lives alone Lives in: House/apartment Stairs: No Has following equipment at home: None  OCCUPATION: Retired   PLOF: Independent with basic ADLs and Independent with community mobility without device  PATIENT GOALS: "I just want to stay active as long/much as I can."   NEXT MD VISIT: 07/30/23 with family medicine   OBJECTIVE:  Note: Objective measures were completed at Evaluation unless otherwise noted.  DIAGNOSTIC FINDINGS:   As found in office note from referring provider:   XRAYS:  Right hip x-ray and left hip x-ray 2 views 06/05/23 with pelvis personally reviewed and interpreted by me today showing: -Mild to moderate right osteoarthritis of the hip joint -Mild left osteoarthritis of the hip joint -CAM-type FAI bilaterally, more prominent on the right compared to the left   Left knee  x-ray 3 views, along with bilateral knee standing AP views 06/05/2023 personally reviewed and interpreted by  me today showing: -Left knee with mild patellofemoral spurring consistent with mild osteoarthritis.  Some of these arthropathic changes at the quadriceps insertion on the patella as well.  No other new abnormalities -Single view of the right knee shows no acute abnormalities    PATIENT SURVEYS:  LEFS 33/80 = 41% perceived function Most difficulty with getting in/out of vehicles, prolonged walking, going up and down stairs, rolling over in bed  COGNITION: Overall cognitive status: Within functional limits for tasks assessed     SENSATION: Not tested   POSTURE: rounded shoulders, forward head, and decreased lumbar lordosis   LUMBAR ROM:  grossly 75% of full AROM in all directions   AROM eval  Flexion   Extension   Right lateral flexion   Left lateral flexion   Right rotation   Left rotation    (Blank rows = not tested)  LOWER EXTREMITY ROM:     Active  Right eval Left eval  Hip flexion    Hip extension    Hip abduction    Hip adduction    Hip internal rotation    Hip external rotation    Knee flexion    Knee extension    Ankle dorsiflexion    Ankle plantarflexion    Ankle inversion    Ankle eversion     (Blank rows = not tested)  LOWER EXTREMITY MMT:    MMT Right eval Left eval  Hip flexion 5 5  Hip extension    Hip abduction 4+ 5  Hip adduction    Hip internal rotation    Hip external rotation    Knee flexion    Knee extension 5 5  Ankle dorsiflexion    Ankle plantarflexion    Ankle inversion    Ankle eversion     (Blank rows = not tested)   LOWER EXTREMITY SPECIAL TESTS:  Hip special tests: Luisa Hart (FABER) test: negative and Anterior hip impingement test: negative     TREATMENT DATE:   [facility] Adult PT Treatment  07/17/2023:  Therapeutic Exercise:  nu-step L5 62m while taking subjective and planning session with patient [facility] red  physioball x 15  LTR on red physioball, x 10 each  S/L clamshell unresisted - 2x10 ea Bridge on ball - 3x10 - small arc SLR - small arc - 2x10 ea, 2 # ankle weight  SAQ - 2x10, 2# ankle weight  Seated therapeutic rest breaks as needed to manage muscular fatigue and COPD symptoms    HOME EXERCISE PROGRAM: Access Code: [email or web address] URL: [email or web address] Date: 07/11/2023 Prepared by: Alphonzo Severance  Exercises - Supine Lower Trunk Rotation  - 1 x daily - 7 x weekly - 2 sets - 10 reps - 3 sec hold - Supine Quad Set  - 3 x daily - 7 x weekly - 2 sets - 10 reps - 10 second hold - Active Straight Leg Raise with Quad Set  - 1 x daily - 7 x weekly - 3 sets - 10 reps - Supine Bridge  - 1 x daily - 7 x weekly - 3 sets - 10 reps - 2-3 seconds hold  ASSESSMENT:  CLINICAL IMPRESSION:  07/17/2023: Patient continues to demonstrate good improvement with LE strengthening. He is able to tolerate additional repetitions today, indicating improved LE strength and mm endurance. Patient was able to tolerate gradual progression of LE strengthening program, including addition of ankle weight for quad strengthening. He reports no significant pain throughout today's session. We will continue to progress as tolerated  in order to reach rehab goals.   Eval: Jeffrey [name] is a 78 y.o. male  who was seen today for physical therapy evaluation and treatment for Left hip and knee pain. His pain is likely related to mild-to-moderate L hip and knee OA. He has negative results for FAI special test at time of evaluation. He has related pain and difficulty with prolonged standing, walking, car transfers and otherwise decreased activity tolerance d/t pain. He requires skilled PT services at this time to address relevant deficits and improve overall function.      OBJECTIVE IMPAIRMENTS: decreased activity tolerance, decreased strength, improper body mechanics, and pain.   ACTIVITY LIMITATIONS: lifting, standing,  stairs, transfers, and bed mobility  PARTICIPATION LIMITATIONS: cleaning, shopping, and community activity  PERSONAL FACTORS: Age, Time since onset of injury/illness/exacerbation, and 3+ comorbidities: Relevant PMHx includes CAD, CKD, COPD, HIV, HTN, Stroke with subsequent right sided weakness, TIA, hx of knee arthroscopy   are also affecting patient's functional outcome.   REHAB POTENTIAL: Fair  CLINICAL DECISION MAKING: Evolving/moderate complexity  EVALUATION COMPLEXITY: Moderate   GOALS: Goals reviewed with patient? Yes  SHORT TERM GOALS: Target date: 07/20/2023   Patient will be independent with initial home program for hip mobility and LE stretches.  Baseline: provided at eval  Goal status: INITIAL   LONG TERM GOALS: Target date: 08/10/2023   Patient will report improved overall functional ability with LEFS score of 50 or greater  Baseline: 33/80 Goal status: INITIAL  2.  Patient will demonstrate ability to perform standing and walking activities >30 minutes without exacerbation of symptoms.  Baseline: poor tolerance Goal status: INITIAL  3.  Patient will report ability to perform car transfers with proper body mechanics and no more than 3/10 LE pain.  Baseline: >5/10 pain, moderate-to-severe difficulty  Goal status: INITIAL  4.  Patient will demonstrate ability to perform bed mobility activities with proper body mechanics and no more then 3/10 LE pain.  Baseline: >5/10 pain, moderate-to-severe difficulty  Goal status: INITIAL   PLAN:  PT FREQUENCY: 1-2x/week  PT DURATION: 6 weeks  PLANNED INTERVENTIONS: [id number]- PT Re-evaluation, 97110-Therapeutic exercises, 97530- Therapeutic activity, 97112- Neuromuscular re-education, 97535- Self Care, 54098- Manual therapy, [phone]- Aquatic Therapy, [id number]- Electrical stimulation (unattended), Patient/Family education, Taping, Dry Needling, Joint mobilization, Spinal mobilization, Cryotherapy, and Moist heat.  PLAN FOR NEXT  SESSION: LE mobility, aerobic activities, isometric strengthening, PNF strengthening, joint distraction/mob, bed mobility training, car transfer training  Mauri Reading, PT, DPT  07/17/2023 1:41 PM    Referring diagnosis? Osteoarthritis of both hips, unspecified osteoarthritis type [M16.0], Chronic pain of left knee [M25.562, G89.29]   Treatment diagnosis? (if different than referring diagnosis)  Pain of both hip joints Chronic pain of both knees  What was this (referring dx) caused by? []  Surgery []  Fall [x]  Ongoing issue [x]  Arthritis []  Other: ____________  Laterality: []  Rt []  Lt [x]  Both  Check all possible CPT codes:  *CHOOSE 10 OR LESS*    See Planned Interventions listed in the Plan section of the Evaluation.

## 2023-07-19 ENCOUNTER — Ambulatory Visit: Payer: Medicare HMO

## 2023-07-19 DIAGNOSIS — M25551 Pain in right hip: Secondary | ICD-10-CM

## 2023-07-19 DIAGNOSIS — M25562 Pain in left knee: Secondary | ICD-10-CM

## 2023-07-19 DIAGNOSIS — M25561 Pain in right knee: Secondary | ICD-10-CM | POA: Diagnosis not present

## 2023-07-19 DIAGNOSIS — M25552 Pain in left hip: Secondary | ICD-10-CM | POA: Diagnosis not present

## 2023-07-19 DIAGNOSIS — G8929 Other chronic pain: Secondary | ICD-10-CM | POA: Diagnosis not present

## 2023-07-19 NOTE — Therapy (Signed)
 OUTPATIENT PHYSICAL THERAPY TREATMENT NOTE   Patient Name: Jeffrey [name] MRN: [id number] DOB:[birth year], 78 y.o., male Today's Date: 07/19/2023  END OF SESSION:  PT End of Session - 07/19/23 1439     Visit Number 5    Number of Visits 10    Date for PT Re-Evaluation 08/10/23    Authorization Type [name] MCR/MCD    Progress Note Due on Visit 10    PT Start Time 1445    PT Stop Time 1523    PT Time Calculation (min) 38 min    Activity Tolerance Patient tolerated treatment well    Behavior During Therapy [facility] for tasks assessed/performed             Past Medical History:  Diagnosis Date   CAD (coronary artery disease)    Chronic kidney disease    Colon polyps    COPD (chronic obstructive pulmonary disease) (HCC)    COVID-19    05/2019   GERD (gastroesophageal reflux disease)    Heart murmur    History of kidney stones    HIV (human immunodeficiency virus infection) (HCC) 1999   Hypertension    Pneumonia    Stroke (HCC)    right side weakness   TIA (transient ischemic attack)    Past Surgical History:  Procedure Laterality Date   CATARACT EXTRACTION W/ INTRAOCULAR LENS IMPLANT Bilateral    CHOLECYSTECTOMY  1982   COLONOSCOPY     ESOPHAGEAL MANOMETRY N/A 09/03/2017   Procedure: ESOPHAGEAL MANOMETRY (EM);  Surgeon: Napoleon Form, MD;  Location: [facility];  Service: Endoscopy;  Laterality: N/A;   IR ANGIO INTRA EXTRACRAN SEL COM CAROTID INNOMINATE BILAT MOD SED  04/21/2019   IR ANGIO INTRA EXTRACRAN SEL COM CAROTID INNOMINATE BILAT MOD SED  05/11/2020   IR ANGIO VERTEBRAL SEL VERTEBRAL BILAT MOD SED  04/21/2019   IR ANGIO VERTEBRAL SEL VERTEBRAL BILAT MOD SED  05/11/2020   IR PTA INTRACRANIAL  05/14/2019   IR US GUIDE VASC ACCESS RIGHT  05/11/2020   KNEE SURGERY     LEFT KNEE   RADIOLOGY WITH ANESTHESIA N/A 05/14/2019   Procedure: IR WITH ANESTHESIA STENTING;  Surgeon: Julieanne Cotton, MD;  Location: [facility] OR;  Service: Radiology;  Laterality: N/A;    TONSILLECTOMY     Patient Active Problem List   Diagnosis Date Noted   Encounter for immunization 04/06/2023   Medication management 04/03/2023   Health care maintenance 04/03/2023   Screening for STDs (sexually transmitted diseases) 04/03/2023   COPD (chronic obstructive pulmonary disease) (HCC) 08/24/2022   Abnormal CT of the chest 08/24/2022   Dizziness 09/07/2021   Blurry vision, bilateral 09/07/2021   Bilateral occipital neuralgia 04/06/2021   Gynecomastia 02/28/2021   Proteinuria 09/23/2020   Nocturnal polyuria 09/21/2020   Meralgia paresthetica, left lower limb 09/21/2020   Dehydration    Infectious colitis    Rib pain 07/13/2020   Syphilis (acquired) 06/22/2020   Rectal abnormality 05/27/2020   Neck pain 12/23/2019   Other headache syndrome 12/23/2019   Occipital neuralgia of right side 12/23/2019   CAD (coronary artery disease) 10/27/2019   Internal carotid artery stenosis, right 05/14/2019   Peripheral vertigo 11/29/2018   Chronic kidney disease 12/20/2017   Diastolic CHF with preserved left ventricular function, NYHA class 2 (HCC) 01/06/2015   Dyspnea on exertion 09/30/2014   Insomnia 07/22/2014   BPH with obstruction/lower urinary tract symptoms 06/17/2014   Hyperlipidemia 07/22/2012   Erectile dysfunction 07/22/2012   GERD 05/17/2010   Dysphagia  03/08/2009   Enlarged lymph nodes 09/19/2007   CHOLECYSTECTOMY, HX OF 05/28/2006   Human immunodeficiency virus (HIV) disease (HCC) 02/27/2006   Essential hypertension 02/27/2006   History of respiratory system disease 02/27/2006   ARTHROSCOPY, KNEE, HX OF 02/27/2006    PCP: Levin Erp, MD  REFERRING PROVIDER: Nestor Ramp, MD   REFERRING DIAG: Osteoarthritis of both hips, unspecified osteoarthritis type [M16.0], Chronic pain of left knee [M25.562, G89.29]   Rationale for Evaluation and Treatment: Rehabilitation  THERAPY DIAG:  Pain of both hip joints  Chronic pain of both knees  ONSET DATE:  06/06/2023 date of referral   SUBJECTIVE:                                                                                                                                                                                           SUBJECTIVE STATEMENT: Patient reports that he did some yard work yesterday and that his Lt knee is bothering today.   EVAL: Patient reports to PT with L>R hip and knee pain that is worse depending on weather, prolonged walking for grocery stopping, and otherwise hard for patient to identify the pattern.   PERTINENT HISTORY:  Relevant PMHx includes CAD, CKD, COPD, HIV, HTN, Stroke with subsequent right sided weakness, TIA, hx of knee arthroscopy    PAIN:  Are you having pain?  Yes: NPRS scale: 0/10 at best, 10/10 at worst Pain location: left >right hip, left knee  Pain description: unable  Aggravating factors: prolonged standing/walking, weather Relieving factors: rest  PRECAUTIONS: None  RED FLAGS: None   WEIGHT BEARING RESTRICTIONS: No  FALLS:  Has patient fallen in last 6 months? No  LIVING ENVIRONMENT: Lives with: lives alone Lives in: House/apartment Stairs: No Has following equipment at home: None  OCCUPATION: Retired   PLOF: Independent with basic ADLs and Independent with community mobility without device  PATIENT GOALS: "I just want to stay active as long/much as I can."   NEXT MD VISIT: 07/30/23 with family medicine   OBJECTIVE:  Note: Objective measures were completed at Evaluation unless otherwise noted.  DIAGNOSTIC FINDINGS:   As found in office note from referring provider:   XRAYS:  Right hip x-ray and left hip x-ray 2 views 06/05/23 with pelvis personally reviewed and interpreted by me today showing: -Mild to moderate right osteoarthritis of the hip joint -Mild left osteoarthritis of the hip joint -CAM-type FAI bilaterally, more prominent on the right compared to the left   Left knee x-ray 3 views, along with bilateral knee  standing AP views 06/05/2023 personally reviewed and interpreted by me today showing: -Left knee with mild patellofemoral  spurring consistent with mild osteoarthritis.  Some of these arthropathic changes at the quadriceps insertion on the patella as well.  No other new abnormalities -Single view of the right knee shows no acute abnormalities    PATIENT SURVEYS:  LEFS 33/80 = 41% perceived function Most difficulty with getting in/out of vehicles, prolonged walking, going up and down stairs, rolling over in bed  COGNITION: Overall cognitive status: Within functional limits for tasks assessed     SENSATION: Not tested   POSTURE: rounded shoulders, forward head, and decreased lumbar lordosis   LUMBAR ROM:  grossly 75% of full AROM in all directions   AROM eval  Flexion   Extension   Right lateral flexion   Left lateral flexion   Right rotation   Left rotation    (Blank rows = not tested)  LOWER EXTREMITY ROM:     Active  Right eval Left eval  Hip flexion    Hip extension    Hip abduction    Hip adduction    Hip internal rotation    Hip external rotation    Knee flexion    Knee extension    Ankle dorsiflexion    Ankle plantarflexion    Ankle inversion    Ankle eversion     (Blank rows = not tested)  LOWER EXTREMITY MMT:    MMT Right eval Left eval  Hip flexion 5 5  Hip extension    Hip abduction 4+ 5  Hip adduction    Hip internal rotation    Hip external rotation    Knee flexion    Knee extension 5 5  Ankle dorsiflexion    Ankle plantarflexion    Ankle inversion    Ankle eversion     (Blank rows = not tested)   LOWER EXTREMITY SPECIAL TESTS:  Hip special tests: Luisa Hart (FABER) test: negative and Anterior hip impingement test: negative     TREATMENT DATE:  [facility] Adult PT Treatment:                                                DATE: 07/19/23 Therapeutic Exercise: nu-step L5 66m while taking subjective and planning session with patient [facility] red  physioball x10 LTR on red physioball, x 10 each  S/L clamshell unresisted - 2x10 ea Bridge on ball - 3x10 - small arc SLR - small arc - 2x10 ea, 2 # ankle weight  SAQ - 2x10, 2# ankle weight  Seated therapeutic rest breaks as needed to manage muscular fatigue and COPD symptoms   [facility] Adult PT Treatment  07/17/23:  Therapeutic Exercise:  nu-step L5 6m while taking subjective and planning session with patient [facility] red physioball x 15  LTR on red physioball, x 10 each  S/L clamshell unresisted - 2x10 ea Bridge on ball - 3x10 - small arc SLR - small arc - 2x10 ea, 2 # ankle weight  SAQ - 2x10, 2# ankle weight  Seated therapeutic rest breaks as needed to manage muscular fatigue and COPD symptoms    HOME EXERCISE PROGRAM: Access Code: [email or web address] URL: [email or web address] Date: 07/11/2023 Prepared by: Alphonzo Severance  Exercises - Supine Lower Trunk Rotation  - 1 x daily - 7 x weekly - 2 sets - 10 reps - 3 sec hold - Supine Quad Set  - 3 x daily - 7 x weekly - 2 sets -  10 reps - 10 second hold - Active Straight Leg Raise with Quad Set  - 1 x daily - 7 x weekly - 3 sets - 10 reps - Supine Bridge  - 1 x daily - 7 x weekly - 3 sets - 10 reps - 2-3 seconds hold  ASSESSMENT:  CLINICAL IMPRESSION: Patient presents to PT reporting increased Lt knee pain today from having done some yard work yesterday. Session today continued to focus on proximal hip and LE strengthening. Patient was able to tolerate all prescribed exercises with no adverse effects. Patient continues to benefit from skilled PT services and should be progressed as able to improve functional independence.   Eval: Abijah is a 77 y.o. male  who was seen today for physical therapy evaluation and treatment for Left hip and knee pain. His pain is likely related to mild-to-moderate L hip and knee OA. He has negative results for FAI special test at time of evaluation. He has related pain and difficulty with prolonged  standing, walking, car transfers and otherwise decreased activity tolerance d/t pain. He requires skilled PT services at this time to address relevant deficits and improve overall function.      OBJECTIVE IMPAIRMENTS: decreased activity tolerance, decreased strength, improper body mechanics, and pain.   ACTIVITY LIMITATIONS: lifting, standing, stairs, transfers, and bed mobility  PARTICIPATION LIMITATIONS: cleaning, shopping, and community activity  PERSONAL FACTORS: Age, Time since onset of injury/illness/exacerbation, and 3+ comorbidities: Relevant PMHx includes CAD, CKD, COPD, HIV, HTN, Stroke with subsequent right sided weakness, TIA, hx of knee arthroscopy   are also affecting patient's functional outcome.   REHAB POTENTIAL: Fair  CLINICAL DECISION MAKING: Evolving/moderate complexity  EVALUATION COMPLEXITY: Moderate   GOALS: Goals reviewed with patient? Yes  SHORT TERM GOALS: Target date: 07/20/2023   Patient will be independent with initial home program for hip mobility and LE stretches.  Baseline: provided at eval  Goal status: MET Pt reports adherence 07/19/23   LONG TERM GOALS: Target date: 08/10/2023   Patient will report improved overall functional ability with LEFS score of 50 or greater  Baseline: 33/80 Goal status: INITIAL  2.  Patient will demonstrate ability to perform standing and walking activities >30 minutes without exacerbation of symptoms.  Baseline: poor tolerance Goal status: INITIAL  3.  Patient will report ability to perform car transfers with proper body mechanics and no more than 3/10 LE pain.  Baseline: >5/10 pain, moderate-to-severe difficulty  Goal status: INITIAL  4.  Patient will demonstrate ability to perform bed mobility activities with proper body mechanics and no more then 3/10 LE pain.  Baseline: >5/10 pain, moderate-to-severe difficulty  Goal status: INITIAL   PLAN:  PT FREQUENCY: 1-2x/week  PT DURATION: 6 weeks  PLANNED  INTERVENTIONS: 97164- PT Re-evaluation, 97110-Therapeutic exercises, 97530- Therapeutic activity, 97112- Neuromuscular re-education, 97535- Self Care, [id number]- Manual therapy, [phone]- Aquatic Therapy, [id number]- Electrical stimulation (unattended), Patient/Family education, Taping, Dry Needling, Joint mobilization, Spinal mobilization, Cryotherapy, and Moist heat.  PLAN FOR NEXT SESSION: LE mobility, aerobic activities, isometric strengthening, PNF strengthening, joint distraction/mob, bed mobility training, car transfer training  Berta Minor PTA  07/19/2023 3:25 PM

## 2023-07-24 ENCOUNTER — Ambulatory Visit: Payer: Medicare HMO

## 2023-07-24 DIAGNOSIS — M25552 Pain in left hip: Secondary | ICD-10-CM | POA: Diagnosis not present

## 2023-07-24 DIAGNOSIS — M25562 Pain in left knee: Secondary | ICD-10-CM | POA: Diagnosis not present

## 2023-07-24 DIAGNOSIS — G8929 Other chronic pain: Secondary | ICD-10-CM

## 2023-07-24 DIAGNOSIS — M25561 Pain in right knee: Secondary | ICD-10-CM | POA: Diagnosis not present

## 2023-07-24 DIAGNOSIS — M25551 Pain in right hip: Secondary | ICD-10-CM | POA: Diagnosis not present

## 2023-07-24 NOTE — Therapy (Signed)
 OUTPATIENT PHYSICAL THERAPY TREATMENT NOTE   Patient Name: Jeffrey [name] MRN: [id number] DOB:[birth year], 78 y.o., male Today's Date: 07/24/2023  END OF SESSION:  PT End of Session - 07/24/23 1348     Visit Number 6    Number of Visits 10    Date for PT Re-Evaluation 08/10/23    Authorization Type [name] MCR/MCD    Progress Note Due on Visit 10    PT Start Time 1400    PT Stop Time 1440    PT Time Calculation (min) 40 min    Activity Tolerance Patient tolerated treatment well    Behavior During Therapy [facility] for tasks assessed/performed             Past Medical History:  Diagnosis Date   CAD (coronary artery disease)    Chronic kidney disease    Colon polyps    COPD (chronic obstructive pulmonary disease) (HCC)    COVID-19    05/2019   GERD (gastroesophageal reflux disease)    Heart murmur    History of kidney stones    HIV (human immunodeficiency virus infection) (HCC) 1999   Hypertension    Pneumonia    Stroke (HCC)    right side weakness   TIA (transient ischemic attack)    Past Surgical History:  Procedure Laterality Date   CATARACT EXTRACTION W/ INTRAOCULAR LENS IMPLANT Bilateral    CHOLECYSTECTOMY  1982   COLONOSCOPY     ESOPHAGEAL MANOMETRY N/A 09/03/2017   Procedure: ESOPHAGEAL MANOMETRY (EM);  Surgeon: Napoleon Form, MD;  Location: [facility];  Service: Endoscopy;  Laterality: N/A;   IR ANGIO INTRA EXTRACRAN SEL COM CAROTID INNOMINATE BILAT MOD SED  04/21/2019   IR ANGIO INTRA EXTRACRAN SEL COM CAROTID INNOMINATE BILAT MOD SED  05/11/2020   IR ANGIO VERTEBRAL SEL VERTEBRAL BILAT MOD SED  04/21/2019   IR ANGIO VERTEBRAL SEL VERTEBRAL BILAT MOD SED  05/11/2020   IR PTA INTRACRANIAL  05/14/2019   IR US GUIDE VASC ACCESS RIGHT  05/11/2020   KNEE SURGERY     LEFT KNEE   RADIOLOGY WITH ANESTHESIA N/A 05/14/2019   Procedure: IR WITH ANESTHESIA STENTING;  Surgeon: Julieanne Cotton, MD;  Location: [facility] OR;  Service: Radiology;  Laterality: N/A;    TONSILLECTOMY     Patient Active Problem List   Diagnosis Date Noted   Encounter for immunization 04/06/2023   Medication management 04/03/2023   Health care maintenance 04/03/2023   Screening for STDs (sexually transmitted diseases) 04/03/2023   COPD (chronic obstructive pulmonary disease) (HCC) 08/24/2022   Abnormal CT of the chest 08/24/2022   Dizziness 09/07/2021   Blurry vision, bilateral 09/07/2021   Bilateral occipital neuralgia 04/06/2021   Gynecomastia 02/28/2021   Proteinuria 09/23/2020   Nocturnal polyuria 09/21/2020   Meralgia paresthetica, left lower limb 09/21/2020   Dehydration    Infectious colitis    Rib pain 07/13/2020   Syphilis (acquired) 06/22/2020   Rectal abnormality 05/27/2020   Neck pain 12/23/2019   Other headache syndrome 12/23/2019   Occipital neuralgia of right side 12/23/2019   CAD (coronary artery disease) 10/27/2019   Internal carotid artery stenosis, right 05/14/2019   Peripheral vertigo 11/29/2018   Chronic kidney disease 12/20/2017   Diastolic CHF with preserved left ventricular function, NYHA class 2 (HCC) 01/06/2015   Dyspnea on exertion 09/30/2014   Insomnia 07/22/2014   BPH with obstruction/lower urinary tract symptoms 06/17/2014   Hyperlipidemia 07/22/2012   Erectile dysfunction 07/22/2012   GERD 05/17/2010   Dysphagia  03/08/2009   Enlarged lymph nodes 09/19/2007   CHOLECYSTECTOMY, HX OF 05/28/2006   Human immunodeficiency virus (HIV) disease (HCC) 02/27/2006   Essential hypertension 02/27/2006   History of respiratory system disease 02/27/2006   ARTHROSCOPY, KNEE, HX OF 02/27/2006    PCP: Levin Erp, MD  REFERRING PROVIDER: Nestor Ramp, MD   REFERRING DIAG: Osteoarthritis of both hips, unspecified osteoarthritis type [M16.0], Chronic pain of left knee [M25.562, G89.29]   Rationale for Evaluation and Treatment: Rehabilitation  THERAPY DIAG:  Pain of both hip joints  Chronic pain of both knees  ONSET DATE:  06/06/2023 date of referral   SUBJECTIVE:                                                                                                                                                                                           SUBJECTIVE STATEMENT: Patient reports that his Lt hip is bothering him some today, may be because he drove to [facility] and back this weekend so was in the car for a long time.  EVAL: Patient reports to PT with L>R hip and knee pain that is worse depending on weather, prolonged walking for grocery stopping, and otherwise hard for patient to identify the pattern.   PERTINENT HISTORY:  Relevant PMHx includes CAD, CKD, COPD, HIV, HTN, Stroke with subsequent right sided weakness, TIA, hx of knee arthroscopy    PAIN:  Are you having pain?  Yes: NPRS scale: 0/10 at best, 10/10 at worst Pain location: left >right hip, left knee  Pain description: unable  Aggravating factors: prolonged standing/walking, weather Relieving factors: rest  PRECAUTIONS: None  RED FLAGS: None   WEIGHT BEARING RESTRICTIONS: No  FALLS:  Has patient fallen in last 6 months? No  LIVING ENVIRONMENT: Lives with: lives alone Lives in: House/apartment Stairs: No Has following equipment at home: None  OCCUPATION: Retired   PLOF: Independent with basic ADLs and Independent with community mobility without device  PATIENT GOALS: "I just want to stay active as long/much as I can."   NEXT MD VISIT: 07/30/23 with family medicine   OBJECTIVE:  Note: Objective measures were completed at Evaluation unless otherwise noted.  DIAGNOSTIC FINDINGS:   As found in office note from referring provider:   XRAYS:  Right hip x-ray and left hip x-ray 2 views 06/05/23 with pelvis personally reviewed and interpreted by me today showing: -Mild to moderate right osteoarthritis of the hip joint -Mild left osteoarthritis of the hip joint -CAM-type FAI bilaterally, more prominent on the right compared to the  left   Left knee x-ray 3 views, along with bilateral knee standing AP views 06/05/2023  personally reviewed and interpreted by me today showing: -Left knee with mild patellofemoral spurring consistent with mild osteoarthritis.  Some of these arthropathic changes at the quadriceps insertion on the patella as well.  No other new abnormalities -Single view of the right knee shows no acute abnormalities    PATIENT SURVEYS:  LEFS 33/80 = 41% perceived function Most difficulty with getting in/out of vehicles, prolonged walking, going up and down stairs, rolling over in bed  COGNITION: Overall cognitive status: Within functional limits for tasks assessed     SENSATION: Not tested   POSTURE: rounded shoulders, forward head, and decreased lumbar lordosis   LUMBAR ROM:  grossly 75% of full AROM in all directions   AROM eval  Flexion   Extension   Right lateral flexion   Left lateral flexion   Right rotation   Left rotation    (Blank rows = not tested)  LOWER EXTREMITY ROM:     Active  Right eval Left eval  Hip flexion    Hip extension    Hip abduction    Hip adduction    Hip internal rotation    Hip external rotation    Knee flexion    Knee extension    Ankle dorsiflexion    Ankle plantarflexion    Ankle inversion    Ankle eversion     (Blank rows = not tested)  LOWER EXTREMITY MMT:    MMT Right eval Left eval  Hip flexion 5 5  Hip extension    Hip abduction 4+ 5  Hip adduction    Hip internal rotation    Hip external rotation    Knee flexion    Knee extension 5 5  Ankle dorsiflexion    Ankle plantarflexion    Ankle inversion    Ankle eversion     (Blank rows = not tested)   LOWER EXTREMITY SPECIAL TESTS:  Hip special tests: Luisa Hart (FABER) test: negative and Anterior hip impingement test: negative     TREATMENT DATE:  [facility] Adult PT Treatment:                                                DATE: 07/24/23 Therapeutic Exercise: nu-step L5 1m while  taking subjective and planning session with patient [facility] red physioball x10 LTR on red physioball, x 10 each  Bridge on ball - 2x10 - small arc SLR - small arc - 2x10 ea, 2 # ankle weight  SAQ - 2x10, 2# ankle weight  STS x10 arms crossed (some SOB) Seated therapeutic rest breaks as needed to manage muscular fatigue and COPD symptoms   [facility] Adult PT Treatment:                                                DATE: 07/19/23 Therapeutic Exercise: nu-step L5 46m while taking subjective and planning session with patient DTKC red physioball x10 LTR on red physioball, x 10 each  S/L clamshell unresisted - 2x10 ea Bridge on ball - 3x10 - small arc SLR - small arc - 2x10 ea, 2 # ankle weight  SAQ - 2x10, 2# ankle weight  Seated therapeutic rest breaks as needed to manage muscular fatigue and COPD symptoms   [facility]  Adult PT Treatment  07/17/23:  Therapeutic Exercise:  nu-step L5 39m while taking subjective and planning session with patient DTKC red physioball x 15  LTR on red physioball, x 10 each  S/L clamshell unresisted - 2x10 ea Bridge on ball - 3x10 - small arc SLR - small arc - 2x10 ea, 2 # ankle weight  SAQ - 2x10, 2# ankle weight  Seated therapeutic rest breaks as needed to manage muscular fatigue and COPD symptoms    HOME EXERCISE PROGRAM: Access Code: [email or web address] URL: [email or web address] Date: 07/11/2023 Prepared by: Alphonzo Severance  Exercises - Supine Lower Trunk Rotation  - 1 x daily - 7 x weekly - 2 sets - 10 reps - 3 sec hold - Supine Quad Set  - 3 x daily - 7 x weekly - 2 sets - 10 reps - 10 second hold - Active Straight Leg Raise with Quad Set  - 1 x daily - 7 x weekly - 3 sets - 10 reps - Supine Bridge  - 1 x daily - 7 x weekly - 3 sets - 10 reps - 2-3 seconds hold  ASSESSMENT:  CLINICAL IMPRESSION: Patient presents to PT reporting some increased Lt hip pain that he partially attributes to being in the car for a prolonged period this weekend. Session  today continued to focus on LE and proximal hip strengthening with rest breaks provided for recovery of SOB as needed. Patient was able to tolerate all prescribed exercises with no adverse effects. Patient continues to benefit from skilled PT services and should be progressed as able to improve functional independence.  Eval: Keelyn is a 78 y.o. male  who was seen today for physical therapy evaluation and treatment for Left hip and knee pain. His pain is likely related to mild-to-moderate L hip and knee OA. He has negative results for FAI special test at time of evaluation. He has related pain and difficulty with prolonged standing, walking, car transfers and otherwise decreased activity tolerance d/t pain. He requires skilled PT services at this time to address relevant deficits and improve overall function.     OBJECTIVE IMPAIRMENTS: decreased activity tolerance, decreased strength, improper body mechanics, and pain.   ACTIVITY LIMITATIONS: lifting, standing, stairs, transfers, and bed mobility  PARTICIPATION LIMITATIONS: cleaning, shopping, and community activity  PERSONAL FACTORS: Age, Time since onset of injury/illness/exacerbation, and 3+ comorbidities: Relevant PMHx includes CAD, CKD, COPD, HIV, HTN, Stroke with subsequent right sided weakness, TIA, hx of knee arthroscopy   are also affecting patient's functional outcome.   REHAB POTENTIAL: Fair  CLINICAL DECISION MAKING: Evolving/moderate complexity  EVALUATION COMPLEXITY: Moderate   GOALS: Goals reviewed with patient? Yes  SHORT TERM GOALS: Target date: 07/20/2023   Patient will be independent with initial home program for hip mobility and LE stretches.  Baseline: provided at eval  Goal status: MET Pt reports adherence 07/19/23   LONG TERM GOALS: Target date: 08/10/2023   Patient will report improved overall functional ability with LEFS score of 50 or greater  Baseline: 33/80 Goal status: INITIAL  2.  Patient will  demonstrate ability to perform standing and walking activities >30 minutes without exacerbation of symptoms.  Baseline: poor tolerance Goal status: INITIAL  3.  Patient will report ability to perform car transfers with proper body mechanics and no more than 3/10 LE pain.  Baseline: >5/10 pain, moderate-to-severe difficulty  Goal status: INITIAL  4.  Patient will demonstrate ability to perform bed mobility activities with proper body mechanics and no more then 3/10  LE pain.  Baseline: >5/10 pain, moderate-to-severe difficulty  Goal status: INITIAL   PLAN:  PT FREQUENCY: 1-2x/week  PT DURATION: 6 weeks  PLANNED INTERVENTIONS: 97164- PT Re-evaluation, 97110-Therapeutic exercises, 97530- Therapeutic activity, 97112- Neuromuscular re-education, 97535- Self Care, 16109- Manual therapy, [id number]- Aquatic Therapy, [id number]- Electrical stimulation (unattended), Patient/Family education, Taping, Dry Needling, Joint mobilization, Spinal mobilization, Cryotherapy, and Moist heat.  PLAN FOR NEXT SESSION: LE mobility, aerobic activities, isometric strengthening, PNF strengthening, joint distraction/mob, bed mobility training, car transfer training  Berta Minor PTA  07/24/2023 2:42 PM

## 2023-07-26 ENCOUNTER — Ambulatory Visit: Payer: Medicare HMO

## 2023-07-26 DIAGNOSIS — M25551 Pain in right hip: Secondary | ICD-10-CM | POA: Diagnosis not present

## 2023-07-26 DIAGNOSIS — G8929 Other chronic pain: Secondary | ICD-10-CM | POA: Diagnosis not present

## 2023-07-26 DIAGNOSIS — M25562 Pain in left knee: Secondary | ICD-10-CM | POA: Diagnosis not present

## 2023-07-26 DIAGNOSIS — M25561 Pain in right knee: Secondary | ICD-10-CM | POA: Diagnosis not present

## 2023-07-26 DIAGNOSIS — M25552 Pain in left hip: Secondary | ICD-10-CM | POA: Diagnosis not present

## 2023-07-26 NOTE — Therapy (Signed)
 OUTPATIENT PHYSICAL THERAPY TREATMENT NOTE   Patient Name: Jeffrey [name] MRN: [id number] DOB:1945/08/15, 78 y.o., male Today's Date: 07/26/2023  END OF SESSION:  PT End of Session - 07/26/23 1358     Visit Number 7    Number of Visits 10    Date for PT Re-Evaluation 08/10/23    Authorization Type [name] MCR/MCD    Progress Note Due on Visit 10    PT Start Time 1400    PT Stop Time 1439    PT Time Calculation (min) 39 min    Activity Tolerance Patient tolerated treatment well    Behavior During Therapy [facility] for tasks assessed/performed              Past Medical History:  Diagnosis Date   CAD (coronary artery disease)    Chronic kidney disease    Colon polyps    COPD (chronic obstructive pulmonary disease) (HCC)    COVID-19    05/2019   GERD (gastroesophageal reflux disease)    Heart murmur    History of kidney stones    HIV (human immunodeficiency virus infection) (HCC) 1999   Hypertension    Pneumonia    Stroke (HCC)    right side weakness   TIA (transient ischemic attack)    Past Surgical History:  Procedure Laterality Date   CATARACT EXTRACTION W/ INTRAOCULAR LENS IMPLANT Bilateral    CHOLECYSTECTOMY  1982   COLONOSCOPY     ESOPHAGEAL MANOMETRY N/A 09/03/2017   Procedure: ESOPHAGEAL MANOMETRY (EM);  Surgeon: Napoleon Form, MD;  Location: [facility];  Service: Endoscopy;  Laterality: N/A;   IR ANGIO INTRA EXTRACRAN SEL COM CAROTID INNOMINATE BILAT MOD SED  04/21/2019   IR ANGIO INTRA EXTRACRAN SEL COM CAROTID INNOMINATE BILAT MOD SED  05/11/2020   IR ANGIO VERTEBRAL SEL VERTEBRAL BILAT MOD SED  04/21/2019   IR ANGIO VERTEBRAL SEL VERTEBRAL BILAT MOD SED  05/11/2020   IR PTA INTRACRANIAL  05/14/2019   IR US GUIDE VASC ACCESS RIGHT  05/11/2020   KNEE SURGERY     LEFT KNEE   RADIOLOGY WITH ANESTHESIA N/A 05/14/2019   Procedure: IR WITH ANESTHESIA STENTING;  Surgeon: Julieanne Cotton, MD;  Location: [facility] OR;  Service: Radiology;  Laterality: N/A;    TONSILLECTOMY     Patient Active Problem List   Diagnosis Date Noted   Encounter for immunization 04/06/2023   Medication management 04/03/2023   Health care maintenance 04/03/2023   Screening for STDs (sexually transmitted diseases) 04/03/2023   COPD (chronic obstructive pulmonary disease) (HCC) 08/24/2022   Abnormal CT of the chest 08/24/2022   Dizziness 09/07/2021   Blurry vision, bilateral 09/07/2021   Bilateral occipital neuralgia 04/06/2021   Gynecomastia 02/28/2021   Proteinuria 09/23/2020   Nocturnal polyuria 09/21/2020   Meralgia paresthetica, left lower limb 09/21/2020   Dehydration    Infectious colitis    Rib pain 07/13/2020   Syphilis (acquired) 06/22/2020   Rectal abnormality 05/27/2020   Neck pain 12/23/2019   Other headache syndrome 12/23/2019   Occipital neuralgia of right side 12/23/2019   CAD (coronary artery disease) 10/27/2019   Internal carotid artery stenosis, right 05/14/2019   Peripheral vertigo 11/29/2018   Chronic kidney disease 12/20/2017   Diastolic CHF with preserved left ventricular function, NYHA class 2 (HCC) 01/06/2015   Dyspnea on exertion 09/30/2014   Insomnia 07/22/2014   BPH with obstruction/lower urinary tract symptoms 06/17/2014   Hyperlipidemia 07/22/2012   Erectile dysfunction 07/22/2012   GERD 05/17/2010  Dysphagia 03/08/2009   Enlarged lymph nodes 09/19/2007   CHOLECYSTECTOMY, HX OF 05/28/2006   Human immunodeficiency virus (HIV) disease (HCC) 02/27/2006   Essential hypertension 02/27/2006   History of respiratory system disease 02/27/2006   ARTHROSCOPY, KNEE, HX OF 02/27/2006    PCP: Levin Erp, MD  REFERRING PROVIDER: Nestor Ramp, MD   REFERRING DIAG: Osteoarthritis of both hips, unspecified osteoarthritis type [M16.0], Chronic pain of left knee [M25.562, G89.29]   Rationale for Evaluation and Treatment: Rehabilitation  THERAPY DIAG:  Pain of both hip joints  Chronic pain of both knees  ONSET DATE:  06/06/2023 date of referral   SUBJECTIVE:                                                                                                                                                                                           SUBJECTIVE STATEMENT: 07/26/2023 Patient reports that his Left hip is bothering him more today d/t moving furniture over the weekend.   EVAL: Patient reports to PT with L>R hip and knee pain that is worse depending on weather, prolonged walking for grocery stopping, and otherwise hard for patient to identify the pattern.   PERTINENT HISTORY:  Relevant PMHx includes CAD, CKD, COPD, HIV, HTN, Stroke with subsequent right sided weakness, TIA, hx of knee arthroscopy    PAIN:  Are you having pain?  Yes: NPRS scale: 0/10 at best, 10/10 at worst Pain location: left >right hip, left knee  Pain description: unable  Aggravating factors: prolonged standing/walking, weather Relieving factors: rest  PRECAUTIONS: None  RED FLAGS: None   WEIGHT BEARING RESTRICTIONS: No  FALLS:  Has patient fallen in last 6 months? No  LIVING ENVIRONMENT: Lives with: lives alone Lives in: House/apartment Stairs: No Has following equipment at home: None  OCCUPATION: Retired   PLOF: Independent with basic ADLs and Independent with community mobility without device  PATIENT GOALS: "I just want to stay active as long/much as I can."   NEXT MD VISIT: 07/30/23 with family medicine   OBJECTIVE:  Note: Objective measures were completed at Evaluation unless otherwise noted.  DIAGNOSTIC FINDINGS:   As found in office note from referring provider:   XRAYS:  Right hip x-ray and left hip x-ray 2 views 06/05/23 with pelvis personally reviewed and interpreted by me today showing: -Mild to moderate right osteoarthritis of the hip joint -Mild left osteoarthritis of the hip joint -CAM-type FAI bilaterally, more prominent on the right compared to the left   Left knee x-ray 3 views, along  with bilateral knee standing AP views 06/05/2023 personally reviewed and interpreted by me today showing: -Left knee with  mild patellofemoral spurring consistent with mild osteoarthritis.  Some of these arthropathic changes at the quadriceps insertion on the patella as well.  No other new abnormalities -Single view of the right knee shows no acute abnormalities    PATIENT SURVEYS:  LEFS 33/80 = 41% perceived function Most difficulty with getting in/out of vehicles, prolonged walking, going up and down stairs, rolling over in bed  COGNITION: Overall cognitive status: Within functional limits for tasks assessed     SENSATION: Not tested   POSTURE: rounded shoulders, forward head, and decreased lumbar lordosis   LUMBAR ROM:  grossly 75% of full AROM in all directions   AROM eval  Flexion   Extension   Right lateral flexion   Left lateral flexion   Right rotation   Left rotation    (Blank rows = not tested)  LOWER EXTREMITY ROM:     Active  Right eval Left eval  Hip flexion    Hip extension    Hip abduction    Hip adduction    Hip internal rotation    Hip external rotation    Knee flexion    Knee extension    Ankle dorsiflexion    Ankle plantarflexion    Ankle inversion    Ankle eversion     (Blank rows = not tested)  LOWER EXTREMITY MMT:    MMT Right eval Left eval  Hip flexion 5 5  Hip extension    Hip abduction 4+ 5  Hip adduction    Hip internal rotation    Hip external rotation    Knee flexion    Knee extension 5 5  Ankle dorsiflexion    Ankle plantarflexion    Ankle inversion    Ankle eversion     (Blank rows = not tested)   LOWER EXTREMITY SPECIAL TESTS:  Hip special tests: Luisa Hart (FABER) test: negative and Anterior hip impingement test: negative     TREATMENT DATE:    [facility] Adult PT Treatment:                                                DATE: 07/26/23  Therapeutic Exercise: DTKC red physioball x15 LTR on red physioball, x 20 each   Bridge on ball - 2x10 - small arc SLR - small arc - 2x10 ea, 2# ankle weight  LAQ - 2x10, 2# ankle weight  Seated hip abduction Blue TB, 2 x 10  Seated hip adduction ball squeeze, 5 sec x 15   Therapeutic Activity:  nu-step L5 x 3m  STS 2x10 arms crossed (some SOB) Seated therapeutic rest breaks as needed to manage muscular fatigue and COPD symptoms   [facility] Adult PT Treatment:                                                DATE: 07/24/23 Therapeutic Exercise: nu-step L5 58m while taking subjective and planning session with patient DTKC red physioball x10 LTR on red physioball, x 10 each  Bridge on ball - 2x10 - small arc SLR - small arc - 2x10 ea, 2 # ankle weight  SAQ - 2x10, 2# ankle weight  STS x10 arms crossed (some SOB) Seated therapeutic rest breaks as needed to manage  muscular fatigue and COPD symptoms   [facility] Adult PT Treatment:                                                DATE: 07/19/23 Therapeutic Exercise: nu-step L5 35m while taking subjective and planning session with patient DTKC red physioball x10 LTR on red physioball, x 10 each  S/L clamshell unresisted - 2x10 ea Bridge on ball - 3x10 - small arc SLR - small arc - 2x10 ea, 2 # ankle weight  SAQ - 2x10, 2# ankle weight  Seated therapeutic rest breaks as needed to manage muscular fatigue and COPD symptoms      HOME EXERCISE PROGRAM: Access Code: [email or web address] URL: [email or web address] Date: 07/11/2023 Prepared by: Alphonzo Severance  Exercises - Supine Lower Trunk Rotation  - 1 x daily - 7 x weekly - 2 sets - 10 reps - 3 sec hold - Supine Quad Set  - 3 x daily - 7 x weekly - 2 sets - 10 reps - 10 second hold - Active Straight Leg Raise with Quad Set  - 1 x daily - 7 x weekly - 3 sets - 10 reps - Supine Bridge  - 1 x daily - 7 x weekly - 3 sets - 10 reps - 2-3 seconds hold  ASSESSMENT:  CLINICAL IMPRESSION: 07/26/2023  Today's session continued to focus on LE and proximal hip strengthening with  rest breaks provided for recovery of SOB as needed. He was able to progress with additional repetitions throughout today's session. We will reassess response to today's session at next visit. Patient continues to benefit from skilled PT services and should be progressed as able to improve functional independence.   Eval: Asahel is a 78 y.o. male  who was seen today for physical therapy evaluation and treatment for Left hip and knee pain. His pain is likely related to mild-to-moderate L hip and knee OA. He has negative results for FAI special test at time of evaluation. He has related pain and difficulty with prolonged standing, walking, car transfers and otherwise decreased activity tolerance d/t pain. He requires skilled PT services at this time to address relevant deficits and improve overall function.     OBJECTIVE IMPAIRMENTS: decreased activity tolerance, decreased strength, improper body mechanics, and pain.   ACTIVITY LIMITATIONS: lifting, standing, stairs, transfers, and bed mobility  PARTICIPATION LIMITATIONS: cleaning, shopping, and community activity  PERSONAL FACTORS: Age, Time since onset of injury/illness/exacerbation, and 3+ comorbidities: Relevant PMHx includes CAD, CKD, COPD, HIV, HTN, Stroke with subsequent right sided weakness, TIA, hx of knee arthroscopy   are also affecting patient's functional outcome.   REHAB POTENTIAL: Fair  CLINICAL DECISION MAKING: Evolving/moderate complexity  EVALUATION COMPLEXITY: Moderate   GOALS: Goals reviewed with patient? Yes  SHORT TERM GOALS: Target date: 07/20/2023   Patient will be independent with initial home program for hip mobility and LE stretches.  Baseline: provided at eval  Goal status: MET Pt reports adherence 07/19/23   LONG TERM GOALS: Target date: 08/10/2023   Patient will report improved overall functional ability with LEFS score of 50 or greater  Baseline: 33/80 Goal status: INITIAL  2.  Patient will demonstrate  ability to perform standing and walking activities >30 minutes without exacerbation of symptoms.  Baseline: poor tolerance Goal status: INITIAL  3.  Patient will report ability to perform car transfers with proper  body mechanics and no more than 3/10 LE pain.  Baseline: >5/10 pain, moderate-to-severe difficulty  Goal status: INITIAL  4.  Patient will demonstrate ability to perform bed mobility activities with proper body mechanics and no more then 3/10 LE pain.  Baseline: >5/10 pain, moderate-to-severe difficulty  Goal status: INITIAL   PLAN:  PT FREQUENCY: 1-2x/week  PT DURATION: 6 weeks  PLANNED INTERVENTIONS: 97164- PT Re-evaluation, 97110-Therapeutic exercises, 97530- Therapeutic activity, 97112- Neuromuscular re-education, 97535- Self Care, 63016- Manual therapy, [phone]- Aquatic Therapy, [id number]- Electrical stimulation (unattended), Patient/Family education, Taping, Dry Needling, Joint mobilization, Spinal mobilization, Cryotherapy, and Moist heat.  PLAN FOR NEXT SESSION: LE mobility, aerobic activities, isometric strengthening, PNF strengthening, joint distraction/mob, bed mobility training, car transfer training  Mauri Reading, PT, DPT  07/26/2023 2:43 PM

## 2023-07-30 ENCOUNTER — Ambulatory Visit: Payer: Medicare HMO

## 2023-07-30 VITALS — Ht 69.0 in | Wt 176.0 lb

## 2023-07-30 DIAGNOSIS — Z0000 Encounter for general adult medical examination without abnormal findings: Secondary | ICD-10-CM

## 2023-07-30 NOTE — Patient Instructions (Addendum)
 Jeffrey [name] , Thank you for taking time to come for your Medicare Wellness Visit. I appreciate your ongoing commitment to your health goals. Please review the following plan we discussed and let me know if I can assist you in the future.   Referrals/Orders/Follow-Ups/Clinician Recommendations: Yes; Keep maintaining your health by keeping your appointments with Dr. Valentino Saxon and any specialists that you may see.  Call us if you need anything.  Have a great year!!!!  This is a list of the screening recommended for you and due dates:  Health Maintenance  Topic Date Due   Zoster (Shingles) Vaccine (1 of 2) Never done   COVID-19 Vaccine (6 - Mixed Product risk 2024-25 season) 10/01/2023   Medicare Annual Wellness Visit  07/29/2024   DTaP/Tdap/Td vaccine (2 - Td or Tdap) 01/05/2025   Pneumonia Vaccine  Completed   Flu Shot  Completed   Hepatitis C Screening  Completed   HPV Vaccine  Aged Out   Colon Cancer Screening  Discontinued    Advanced directives: (Declined) Advance directive discussed with you today. Even though you declined this today, please call our office should you change your mind, and we can give you the proper paperwork for you to fill out.  Next Medicare Annual Wellness Visit scheduled for next year: Yes

## 2023-07-30 NOTE — Progress Notes (Signed)
 Subjective:   Jeffrey [name] is a 78 y.o. who presents for a Medicare Wellness preventive visit.  Visit Complete: Virtual I connected with  Jeffrey [name] on 07/30/23 by a audio enabled telemedicine application and verified that I am speaking with the correct person using two identifiers.  Patient Location: Home  Provider Location: Office/Clinic  I discussed the limitations of evaluation and management by telemedicine. The patient expressed understanding and agreed to proceed.  Vital Signs: Because this visit was a virtual/telehealth visit, some criteria may be missing or patient reported. Any vitals not documented were not able to be obtained and vitals that have been documented are patient reported.  VideoDeclined- This patient declined Librarian, academic. Therefore the visit was completed with audio only.  Persons Participating in Visit: Patient.  AWV Questionnaire: No: Patient Medicare AWV questionnaire was not completed prior to this visit.  Cardiac Risk Factors include: advanced age (>38men, >95 women);dyslipidemia;family history of premature cardiovascular disease;hypertension;male gender     Objective:    Today's Vitals   07/30/23 1003 07/30/23 1004  Weight: 176 lb (79.8 kg)   Height: 5\' 9"  (1.753 m)   PainSc: 0-No pain 0-No pain   Body mass index is 25.99 kg/m.     07/30/2023   10:12 AM 06/29/2023    1:08 PM 06/04/2023    2:30 PM 07/03/2022    9:47 AM 09/07/2021    3:33 PM 04/06/2021    2:51 PM 02/28/2021    9:24 AM  Advanced Directives  Does Patient Have a Medical Advance Directive? No Yes No Yes No No No  Type of Special educational needs teacher of [place];Living will  Healthcare Power of [place];Living will     Copy of Healthcare Power of Attorney in Chart?    No - copy requested     Would patient like information on creating a medical advance directive? No - Patient declined  No - Patient declined No - Patient declined No -  Patient declined No - Patient declined No - Patient declined    Current Medications (verified) Outpatient Encounter Medications as of 07/30/2023  Medication Sig   abacavir-dolutegravir-lamiVUDine (TRIUMEQ) 600-50-300 MG tablet Take 1 tablet by mouth daily.   albuterol (VENTOLIN HFA) 108 (90 Base) MCG/ACT inhaler INHALE 2 PUFFS BY MOUTH EVERY 6 HOURS AS NEEDED FOR WHEEZING FOR SHORTNESS OF BREATH   Ascorbic Acid (VITAMIN C) 1000 MG tablet Take 1,000 mg by mouth daily at 6 PM.   aspirin EC 81 MG tablet Take 1 tablet (81 mg total) by mouth daily.   Cholecalciferol (VITAMIN D3) 125 MCG (5000 UT) TABS Take 5,000 Units by mouth daily at 6 PM.   clopidogrel (PLAVIX) 75 MG tablet Take 1 tablet by mouth once daily   finasteride (PROSCAR) 5 MG tablet Take 5 mg by mouth in the morning and at bedtime.   losartan (COZAAR) 50 MG tablet Take 50 mg by mouth daily. Started by his kidney doctor- Dr. Thedore Mins - [facility]   pantoprazole (PROTONIX) 20 MG tablet Take 1 tablet by mouth twice daily   rosuvastatin (CRESTOR) 20 MG tablet Take 1 tablet by mouth once daily   sildenafil (REVATIO) 20 MG tablet Take 20 mg by mouth as needed.   solifenacin (VESICARE) 10 MG tablet Take by mouth.   STIOLTO RESPIMAT 2.5-2.5 MCG/ACT AERS INHALE 2 PUFFS BY MOUTH ONCE DAILY   sucralfate (CARAFATE) 1 GM/10ML suspension Take 10 mLs (1 g total) by mouth every 6 (six) hours  as needed.   vitamin B-12 (CYANOCOBALAMIN) 500 MCG tablet Take 500 mcg by mouth daily.   Zinc 50 MG TABS Take 50 mg by mouth daily at 6 PM.   No facility-administered encounter medications on file as of 07/30/2023.    Allergies (verified) Baclofen, Efavirenz, and Nevirapine   History: Past Medical History:  Diagnosis Date   CAD (coronary artery disease)    Chronic kidney disease    Colon polyps    COPD (chronic obstructive pulmonary disease) (HCC)    COVID-19    05/2019   GERD (gastroesophageal reflux disease)    Heart murmur    History of kidney  stones    HIV (human immunodeficiency virus infection) (HCC) 1999   Hypertension    Pneumonia    Stroke (HCC)    right side weakness   TIA (transient ischemic attack)    Past Surgical History:  Procedure Laterality Date   CATARACT EXTRACTION W/ INTRAOCULAR LENS IMPLANT Bilateral    CHOLECYSTECTOMY  1982   COLONOSCOPY     ESOPHAGEAL MANOMETRY N/A 09/03/2017   Procedure: ESOPHAGEAL MANOMETRY (EM);  Surgeon: Napoleon Form, MD;  Location: [facility];  Service: Endoscopy;  Laterality: N/A;   IR ANGIO INTRA EXTRACRAN SEL COM CAROTID INNOMINATE BILAT MOD SED  04/21/2019   IR ANGIO INTRA EXTRACRAN SEL COM CAROTID INNOMINATE BILAT MOD SED  05/11/2020   IR ANGIO VERTEBRAL SEL VERTEBRAL BILAT MOD SED  04/21/2019   IR ANGIO VERTEBRAL SEL VERTEBRAL BILAT MOD SED  05/11/2020   IR PTA INTRACRANIAL  05/14/2019   IR US GUIDE VASC ACCESS RIGHT  05/11/2020   KNEE SURGERY     LEFT KNEE   RADIOLOGY WITH ANESTHESIA N/A 05/14/2019   Procedure: IR WITH ANESTHESIA STENTING;  Surgeon: Julieanne Cotton, MD;  Location: [facility] OR;  Service: Radiology;  Laterality: N/A;   TONSILLECTOMY     Family History  Problem Relation Age of Onset   Hypertension Mother    Heart attack Mother 98   Alzheimer's disease Sister    Heart disease Sister    Alzheimer's disease Sister    Drug abuse Sister    COPD Brother    Kidney cancer Brother    Colon cancer Neg Hx    Esophageal cancer Neg Hx    Pancreatic cancer Neg Hx    Stomach cancer Neg Hx    Liver disease Neg Hx    Social History   Socioeconomic History   Marital status: Divorced    Spouse name: Not on file   Number of children: 2   Years of education: 14   Highest education level: Some college, no degree  Occupational History   Occupation: retired    Associate Professor: [employer]  Tobacco Use   Smoking status: Never   Smokeless tobacco: Former    Types: Chew    Quit date: 2019  Vaping Use   Vaping status: Never Used  Substance and Sexual Activity    Alcohol use: Yes    Comment: rare - few per year   Drug use: No   Sexual activity: Not Currently    Partners: Female    Comment: declined condoms  Other Topics Concern   Not on file  Social History Narrative   Lives alone   Right handed    Caffeine use: tea, mainly water      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, English as a foreign language   2 children - 1 boy and 1 girl   Social Drivers of Health  Financial Resource Strain: Low Risk  (07/30/2023)   Overall Financial Resource Strain (CARDIA)    Difficulty of Paying Living Expenses: Not hard at all  Food Insecurity: No Food Insecurity (07/30/2023)   Hunger Vital Sign    Worried About Running Out of Food in the Last Year: Never true    Ran Out of Food in the Last Year: Never true  Transportation Needs: No Transportation Needs (07/30/2023)   PRAPARE - Administrator, Civil Service (Medical): No    Lack of Transportation (Non-Medical): No  Physical Activity: Sufficiently Active (07/30/2023)   Exercise Vital Sign    Days of Exercise per Week: 7 days    Minutes of Exercise per Session: 60 min  Stress: No Stress Concern Present (07/30/2023)   [employer] of Occupational Health - Occupational Stress Questionnaire    Feeling of Stress : Not at all  Social Connections: Socially Isolated (07/30/2023)   Social Connection and Isolation Panel [NHANES]    Frequency of Communication with Friends and Family: More than three times a week    Frequency of Social Gatherings with Friends and Family: Once a week    Attends Religious Services: Never    Database administrator or Organizations: Patient declined    Attends Banker Meetings: Never    Marital Status: Divorced    Tobacco Counseling Counseling given: Not Answered    Clinical Intake:  Pre-visit preparation completed: Yes  Pain : No/denies pain Pain Score: 0-No pain     BMI - recorded: 25.99 Nutritional Status: BMI 25 -29 Overweight Nutritional Risks: None Diabetes:  No  Lab Results  Component Value Date   HGBA1C 5.5 09/21/2020   HGBA1C 5.2 09/13/2015     How often do you need to have someone help you when you read instructions, pamphlets, or other written materials from your doctor or pharmacy?: 1 - Never 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 Aronda Burford, LPN.   Activities of Daily Living     07/30/2023   10:06 AM  In your present state of health, do you have any difficulty performing the following activities:  Hearing? 0  Vision? 0  Difficulty concentrating or making decisions? 0  Walking or climbing stairs? 0  Dressing or bathing? 0  Doing errands, shopping? 0  Preparing Food and eating ? N  Using the Toilet? N  In the past six months, have you accidently leaked urine? N  Comment urology patient  Do you have problems with loss of bowel control? N  Managing your Medications? N  Managing your Finances? N  Housekeeping or managing your Housekeeping? N    Patient Care Team: Levin Erp, MD as PCP - General (Family Medicine) Cliffton Asters, MD (Inactive) as PCP - Infectious Diseases (Infectious Diseases) Wendall Stade, MD as PCP - Cardiology (Cardiology) Juanell Fairly, RN as [facility] Care Management Groat, Bertram Millard, MD as Consulting Physician (Ophthalmology)  Indicate any recent Medical Services you may have received from other than [facility] providers in the past year (date may be approximate).     Assessment:   This is a routine wellness examination for [place].  Hearing/Vision screen Hearing Screening - Comments:: Denies hearing difficulties.   Vision Screening - Comments:: Wears rx glasses - up to date with routine eye exams with Olivia Canter, MD. Cataracts removed and has lens implants.    Goals Addressed  This Visit's Progress    My goal is to stay active.         Depression Screen      07/30/2023   10:05 AM 06/04/2023    2:29 PM 04/03/2023   10:18 AM 07/03/2022    9:36 AM 03/07/2022    1:56 PM 09/07/2021    3:32 PM 04/06/2021    2:52 PM  PHQ 2/9 Scores  PHQ - 2 Score 0 0 0 0 0 0 0  PHQ- 9 Score 0 2    4 2     Fall Risk     07/30/2023   10:05 AM 06/04/2023    2:29 PM 04/03/2023   10:18 AM 07/03/2022    9:40 AM 07/03/2022    9:36 AM  Fall Risk   Falls in the past year? 0 0 0 0 0  Number falls in past yr: 0 0 0 0 0  Injury with Fall? 0 0 0 0   Risk for fall due to : No Fall Risks   No Fall Risks No Fall Risks  Follow up Falls prevention discussed;Falls evaluation completed   Falls evaluation completed Falls evaluation completed    MEDICARE RISK AT HOME:  Medicare Risk at Home Any stairs in or around the home?: No If so, are there any without handrails?: No Home free of loose throw rugs in walkways, pet beds, electrical cords, etc?: Yes Adequate lighting in your home to reduce risk of falls?: Yes Life alert?: No Use of a cane, walker or w/c?: No Grab bars in the bathroom?: No Shower chair or bench in shower?: No Elevated toilet seat or a handicapped toilet?: No  TIMED UP AND [name]:  Was the test performed?  No  Cognitive Function: 6CIT completed    07/30/2023   10:14 AM  MMSE - Mini Mental State Exam  Not completed: Unable to complete        07/30/2023   10:15 AM 07/03/2022    9:41 AM 12/17/2020    5:29 PM  6CIT Screen  What Year? 0 points 0 points 0 points  What month? 0 points 0 points 0 points  What time? 0 points 0 points 0 points  Count back from 20 0 points 2 points 0 points  Months in reverse 0 points 0 points 0 points  Repeat phrase 0 points 0 points 0 points  Total Score 0 points 2 points 0 points    Immunizations Immunization History  Administered Date(s) Administered   Fluad Quad(high Dose 65+) 03/07/2022   Fluad Trivalent(High Dose 65+) 04/03/2023   H1N1 09/22/2008   Hepatitis B 09/24/2001, 11/07/2001, 05/26/2002   Influenza Whole  02/20/2006, 03/21/2007, 03/31/2008, 03/08/2009, 01/26/2010, 12/28/2010   Influenza,inj,Quad PF,6+ Mos 04/10/2013, 01/27/2014, 01/21/2015, 01/20/2016, 01/23/2017, 02/07/2018, 01/21/2019, 01/31/2021   Influenza-Unspecified 03/13/2013   Moderna Sars-Covid-2 Vaccination 09/17/2019, 10/15/2019   PFIZER Comirnaty(Gray Top)Covid-19 Tri-Sucrose Vaccine 05/20/2021   Pfizer(Comirnaty)Fall Seasonal Vaccine 12 years and older 03/07/2022, 04/03/2023   Pneumococcal Conjugate-13 01/06/2015   Pneumococcal Polysaccharide-23 09/22/2008, 08/07/2013   Tdap 01/06/2015    Screening Tests Health Maintenance  Topic Date Due   Zoster Vaccines- Shingrix (1 of 2) Never done   COVID-19 Vaccine (6 - Mixed Product risk 2024-25 season) 10/01/2023   Medicare Annual Wellness (AWV)  07/29/2024   DTaP/Tdap/Td (2 - Td or Tdap) 01/05/2025   Pneumonia Vaccine 101+ Years old  Completed   INFLUENZA VACCINE  Completed   Hepatitis C Screening  Completed   HPV VACCINES  Aged Out  Colonoscopy  Discontinued    Health Maintenance  Health Maintenance Due  Topic Date Due   Zoster Vaccines- Shingrix (1 of 2) Never done   Health Maintenance Items Addressed: Yes; Patient advised that he is due for Shingrix vaccine.  Additional Screening:  Vision Screening: Recommended annual ophthalmology exams for early detection of glaucoma and other disorders of the eye.  Dental Screening: Recommended annual dental exams for proper oral hygiene  Community Resource Referral / Chronic Care Management: CRR required this visit?  No   CCM required this visit?  No     Plan:     I have personally reviewed and noted the following in the patient's chart:   Medical and social history Use of alcohol, tobacco or illicit drugs  Current medications and supplements including opioid prescriptions. Patient is not currently taking opioid prescriptions. Functional ability and status Nutritional status Physical activity Advanced  directives List of other physicians Hospitalizations, surgeries, and ER visits in previous 12 months Vitals Screenings to include cognitive, depression, and falls Referrals and appointments  In addition, I have reviewed and discussed with patient certain preventive protocols, quality metrics, and best practice recommendations. A written personalized care plan for preventive services as well as general preventive health recommendations were provided to patient.     Mickeal Needy, LPN   1/61/0960   After Visit Summary: (Declined) Due to this being a telephonic visit, with patients personalized plan was offered to patient but patient Declined AVS at this time   Notes: Please refer to Routing Comments.

## 2023-07-31 ENCOUNTER — Encounter: Payer: Self-pay | Admitting: Physical Therapy

## 2023-07-31 ENCOUNTER — Ambulatory Visit: Payer: Medicare HMO | Admitting: Physical Therapy

## 2023-07-31 DIAGNOSIS — M25551 Pain in right hip: Secondary | ICD-10-CM | POA: Diagnosis not present

## 2023-07-31 DIAGNOSIS — G8929 Other chronic pain: Secondary | ICD-10-CM

## 2023-07-31 DIAGNOSIS — M25552 Pain in left hip: Secondary | ICD-10-CM | POA: Diagnosis not present

## 2023-07-31 DIAGNOSIS — M25562 Pain in left knee: Secondary | ICD-10-CM | POA: Diagnosis not present

## 2023-07-31 DIAGNOSIS — M25561 Pain in right knee: Secondary | ICD-10-CM | POA: Diagnosis not present

## 2023-07-31 NOTE — Therapy (Signed)
 OUTPATIENT PHYSICAL THERAPY TREATMENT NOTE   Patient Name: Jeffrey [name] MRN: [id number] DOB:August 26, 1945, 78 y.o., male Today's Date: 07/31/2023  END OF SESSION:  PT End of Session - 07/31/23 1304     Visit Number 8    Number of Visits 10    Date for PT Re-Evaluation 08/10/23    Authorization Type [name] MCR/MCD    Progress Note Due on Visit 10    PT Start Time 0100    PT Stop Time 0142    PT Time Calculation (min) 42 min    Activity Tolerance Patient tolerated treatment well    Behavior During Therapy [facility] for tasks assessed/performed              Past Medical History:  Diagnosis Date   CAD (coronary artery disease)    Chronic kidney disease    Colon polyps    COPD (chronic obstructive pulmonary disease) (HCC)    COVID-19    05/2019   GERD (gastroesophageal reflux disease)    Heart murmur    History of kidney stones    HIV (human immunodeficiency virus infection) (HCC) 1999   Hypertension    Pneumonia    Stroke (HCC)    right side weakness   TIA (transient ischemic attack)    Past Surgical History:  Procedure Laterality Date   CATARACT EXTRACTION W/ INTRAOCULAR LENS IMPLANT Bilateral    CHOLECYSTECTOMY  1982   COLONOSCOPY     ESOPHAGEAL MANOMETRY N/A 09/03/2017   Procedure: ESOPHAGEAL MANOMETRY (EM);  Surgeon: Napoleon Form, MD;  Location: [facility];  Service: Endoscopy;  Laterality: N/A;   IR ANGIO INTRA EXTRACRAN SEL COM CAROTID INNOMINATE BILAT MOD SED  04/21/2019   IR ANGIO INTRA EXTRACRAN SEL COM CAROTID INNOMINATE BILAT MOD SED  05/11/2020   IR ANGIO VERTEBRAL SEL VERTEBRAL BILAT MOD SED  04/21/2019   IR ANGIO VERTEBRAL SEL VERTEBRAL BILAT MOD SED  05/11/2020   IR PTA INTRACRANIAL  05/14/2019   IR US GUIDE VASC ACCESS RIGHT  05/11/2020   KNEE SURGERY     LEFT KNEE   RADIOLOGY WITH ANESTHESIA N/A 05/14/2019   Procedure: IR WITH ANESTHESIA STENTING;  Surgeon: Julieanne Cotton, MD;  Location: [facility] OR;  Service: Radiology;  Laterality: N/A;    TONSILLECTOMY     Patient Active Problem List   Diagnosis Date Noted   Encounter for immunization 04/06/2023   Medication management 04/03/2023   Health care maintenance 04/03/2023   Screening for STDs (sexually transmitted diseases) 04/03/2023   COPD (chronic obstructive pulmonary disease) (HCC) 08/24/2022   Abnormal CT of the chest 08/24/2022   Dizziness 09/07/2021   Blurry vision, bilateral 09/07/2021   Bilateral occipital neuralgia 04/06/2021   Gynecomastia 02/28/2021   Proteinuria 09/23/2020   Nocturnal polyuria 09/21/2020   Meralgia paresthetica, left lower limb 09/21/2020   Dehydration    Infectious colitis    Rib pain 07/13/2020   Syphilis (acquired) 06/22/2020   Rectal abnormality 05/27/2020   Neck pain 12/23/2019   Other headache syndrome 12/23/2019   Occipital neuralgia of right side 12/23/2019   CAD (coronary artery disease) 10/27/2019   Internal carotid artery stenosis, right 05/14/2019   Peripheral vertigo 11/29/2018   Chronic kidney disease 12/20/2017   Diastolic CHF with preserved left ventricular function, NYHA class 2 (HCC) 01/06/2015   Dyspnea on exertion 09/30/2014   Insomnia 07/22/2014   BPH with obstruction/lower urinary tract symptoms 06/17/2014   Hyperlipidemia 07/22/2012   Erectile dysfunction 07/22/2012   GERD 05/17/2010  Dysphagia 03/08/2009   Enlarged lymph nodes 09/19/2007   CHOLECYSTECTOMY, HX OF 05/28/2006   Human immunodeficiency virus (HIV) disease (HCC) 02/27/2006   Essential hypertension 02/27/2006   History of respiratory system disease 02/27/2006   ARTHROSCOPY, KNEE, HX OF 02/27/2006    PCP: Levin Erp, MD  REFERRING PROVIDER: Nestor Ramp, MD   REFERRING DIAG: Osteoarthritis of both hips, unspecified osteoarthritis type [M16.0], Chronic pain of left knee [M25.562, G89.29]   Rationale for Evaluation and Treatment: Rehabilitation  THERAPY DIAG:  Pain of both hip joints  Chronic pain of both knees  ONSET DATE:  06/06/2023 date of referral   SUBJECTIVE:                                                                                                                                                                                           SUBJECTIVE STATEMENT: 07/31/2023 Patient reports that his Left hip is bothering him more today after making a long drive.  EVAL: Patient reports to PT with L>R hip and knee pain that is worse depending on weather, prolonged walking for grocery stopping, and otherwise hard for patient to identify the pattern.   PERTINENT HISTORY:  Relevant PMHx includes CAD, CKD, COPD, HIV, HTN, Stroke with subsequent right sided weakness, TIA, hx of knee arthroscopy    PAIN:  Are you having pain?  Yes: NPRS scale: 0/10 at best, 10/10 at worst Pain location: left >right hip, left knee  Pain description: unable  Aggravating factors: prolonged standing/walking, weather Relieving factors: rest  PRECAUTIONS: None  RED FLAGS: None   WEIGHT BEARING RESTRICTIONS: No  FALLS:  Has patient fallen in last 6 months? No  LIVING ENVIRONMENT: Lives with: lives alone Lives in: House/apartment Stairs: No Has following equipment at home: None  OCCUPATION: Retired   PLOF: Independent with basic ADLs and Independent with community mobility without device  PATIENT GOALS: "I just want to stay active as long/much as I can."   NEXT MD VISIT: 07/30/23 with family medicine   OBJECTIVE:  Note: Objective measures were completed at Evaluation unless otherwise noted.  DIAGNOSTIC FINDINGS:   As found in office note from referring provider:   XRAYS:  Right hip x-ray and left hip x-ray 2 views 06/05/23 with pelvis personally reviewed and interpreted by me today showing: -Mild to moderate right osteoarthritis of the hip joint -Mild left osteoarthritis of the hip joint -CAM-type FAI bilaterally, more prominent on the right compared to the left   Left knee x-ray 3 views, along with bilateral  knee standing AP views 06/05/2023 personally reviewed and interpreted by me today showing: -Left knee with mild patellofemoral  spurring consistent with mild osteoarthritis.  Some of these arthropathic changes at the quadriceps insertion on the patella as well.  No other new abnormalities -Single view of the right knee shows no acute abnormalities    PATIENT SURVEYS:  LEFS 33/80 = 41% perceived function Most difficulty with getting in/out of vehicles, prolonged walking, going up and down stairs, rolling over in bed  COGNITION: Overall cognitive status: Within functional limits for tasks assessed     SENSATION: Not tested   POSTURE: rounded shoulders, forward head, and decreased lumbar lordosis   LUMBAR ROM:  grossly 75% of full AROM in all directions   AROM eval  Flexion   Extension   Right lateral flexion   Left lateral flexion   Right rotation   Left rotation    (Blank rows = not tested)  LOWER EXTREMITY ROM:     Active  Right eval Left eval  Hip flexion    Hip extension    Hip abduction    Hip adduction    Hip internal rotation    Hip external rotation    Knee flexion    Knee extension    Ankle dorsiflexion    Ankle plantarflexion    Ankle inversion    Ankle eversion     (Blank rows = not tested)  LOWER EXTREMITY MMT:    MMT Right eval Left eval  Hip flexion 5 5  Hip extension    Hip abduction 4+ 5  Hip adduction    Hip internal rotation    Hip external rotation    Knee flexion    Knee extension 5 5  Ankle dorsiflexion    Ankle plantarflexion    Ankle inversion    Ankle eversion     (Blank rows = not tested)   LOWER EXTREMITY SPECIAL TESTS:  Hip special tests: Luisa Hart (FABER) test: negative and Anterior hip impingement test: negative     TREATMENT DATE:    [facility] Adult PT Treatment:                                                DATE: 07/31/23  Therapeutic Exercise: nu-step L5 x 7m  DTKC red physioball 1' x2 with PT OP LTR on red  physioball 20x 90-90 abdominal hold 10'' x4 SLR - 2x10 ea S/L clam - YTB - 2x10  Therapeutic Activity:  STS 2x10 arms crossed (some SOB) Seated therapeutic rest breaks as needed to manage muscular fatigue and COPD symptoms Bridge on ball - 10x 10''   [facility] Adult PT Treatment:                                                DATE: 07/24/23 Therapeutic Exercise: nu-step L5 1m while taking subjective and planning session with patient DTKC red physioball x10 LTR on red physioball, x 10 each  Bridge on ball - 2x10 - small arc SLR - small arc - 2x10 ea, 2 # ankle weight  SAQ - 2x10, 2# ankle weight  STS x10 arms crossed (some SOB) Seated therapeutic rest breaks as needed to manage muscular fatigue and COPD symptoms   [facility] Adult PT Treatment:  DATE: 07/19/23 Therapeutic Exercise: nu-step L5 53m while taking subjective and planning session with patient DTKC red physioball x10 LTR on red physioball, x 10 each  S/L clamshell unresisted - 2x10 ea Bridge on ball - 3x10 - small arc SLR - small arc - 2x10 ea, 2 # ankle weight  SAQ - 2x10, 2# ankle weight  Seated therapeutic rest breaks as needed to manage muscular fatigue and COPD symptoms      HOME EXERCISE PROGRAM: Access Code: [email or web address] URL: [email or web address] Date: 07/11/2023 Prepared by: Alphonzo Severance  Exercises - Supine Lower Trunk Rotation  - 1 x daily - 7 x weekly - 2 sets - 10 reps - 3 sec hold - Supine Quad Set  - 3 x daily - 7 x weekly - 2 sets - 10 reps - 10 second hold - Active Straight Leg Raise with Quad Set  - 1 x daily - 7 x weekly - 3 sets - 10 reps - Supine Bridge  - 1 x daily - 7 x weekly - 3 sets - 10 reps - 2-3 seconds hold  ASSESSMENT:  CLINICAL IMPRESSION: 07/31/2023  Chrystopher tolerated session well with no adverse reaction.  We continued focus on hip and core strengthening.  Able to progress hold time and ROM for bridge.  Able to complete full SLR  ROM today with fatigue but no increase in pain.  Added volume to sit to stands and will continue to progress standing compound movements as tolerated.   Eval: Krishan is a 78 y.o. male  who was seen today for physical therapy evaluation and treatment for Left hip and knee pain. His pain is likely related to mild-to-moderate L hip and knee OA. He has negative results for FAI special test at time of evaluation. He has related pain and difficulty with prolonged standing, walking, car transfers and otherwise decreased activity tolerance d/t pain. He requires skilled PT services at this time to address relevant deficits and improve overall function.     OBJECTIVE IMPAIRMENTS: decreased activity tolerance, decreased strength, improper body mechanics, and pain.   ACTIVITY LIMITATIONS: lifting, standing, stairs, transfers, and bed mobility  PARTICIPATION LIMITATIONS: cleaning, shopping, and community activity  PERSONAL FACTORS: Age, Time since onset of injury/illness/exacerbation, and 3+ comorbidities: Relevant PMHx includes CAD, CKD, COPD, HIV, HTN, Stroke with subsequent right sided weakness, TIA, hx of knee arthroscopy   are also affecting patient's functional outcome.   REHAB POTENTIAL: Fair  CLINICAL DECISION MAKING: Evolving/moderate complexity  EVALUATION COMPLEXITY: Moderate   GOALS: Goals reviewed with patient? Yes  SHORT TERM GOALS: Target date: 07/20/2023   Patient will be independent with initial home program for hip mobility and LE stretches.  Baseline: provided at eval  Goal status: MET Pt reports adherence 07/19/23   LONG TERM GOALS: Target date: 08/10/2023   Patient will report improved overall functional ability with LEFS score of 50 or greater  Baseline: 33/80 Goal status: INITIAL  2.  Patient will demonstrate ability to perform standing and walking activities >30 minutes without exacerbation of symptoms.  Baseline: poor tolerance Goal status: INITIAL  3.  Patient  will report ability to perform car transfers with proper body mechanics and no more than 3/10 LE pain.  Baseline: >5/10 pain, moderate-to-severe difficulty  Goal status: INITIAL  4.  Patient will demonstrate ability to perform bed mobility activities with proper body mechanics and no more then 3/10 LE pain.  Baseline: >5/10 pain, moderate-to-severe difficulty  Goal status: INITIAL   PLAN:  PT FREQUENCY: 1-2x/week  PT DURATION: 6 weeks  PLANNED INTERVENTIONS: 97164- PT Re-evaluation, 97110-Therapeutic exercises, 97530- Therapeutic activity, 97112- Neuromuscular re-education, 97535- Self Care, [id number]- Manual therapy, [phone]- Aquatic Therapy, [phone]- Electrical stimulation (unattended), Patient/Family education, Taping, Dry Needling, Joint mobilization, Spinal mobilization, Cryotherapy, and Moist heat.  PLAN FOR NEXT SESSION: LE mobility, aerobic activities, isometric strengthening, PNF strengthening, joint distraction/mob, bed mobility training, car transfer training  Fredderick Phenix PT 07/31/2023 2:52 PM

## 2023-08-02 ENCOUNTER — Ambulatory Visit: Payer: Medicare HMO

## 2023-08-02 DIAGNOSIS — G8929 Other chronic pain: Secondary | ICD-10-CM

## 2023-08-02 DIAGNOSIS — M25551 Pain in right hip: Secondary | ICD-10-CM

## 2023-08-02 DIAGNOSIS — M25552 Pain in left hip: Secondary | ICD-10-CM | POA: Diagnosis not present

## 2023-08-02 DIAGNOSIS — M25561 Pain in right knee: Secondary | ICD-10-CM | POA: Diagnosis not present

## 2023-08-02 DIAGNOSIS — M25562 Pain in left knee: Secondary | ICD-10-CM | POA: Diagnosis not present

## 2023-08-02 NOTE — Therapy (Signed)
 OUTPATIENT PHYSICAL THERAPY TREATMENT NOTE   Patient Name: Jeffrey [name] MRN: [id number] DOB:1946/04/04, 78 y.o., male Today's Date: 08/02/2023  END OF SESSION:  PT End of Session - 08/02/23 1220     Visit Number 9    Number of Visits 10    Date for PT Re-Evaluation 08/10/23    Authorization Type [name] MCR/MCD    Progress Note Due on Visit 10    PT Start Time 1220    PT Stop Time 1258    PT Time Calculation (min) 38 min    Activity Tolerance Patient tolerated treatment well    Behavior During Therapy [facility] for tasks assessed/performed               Past Medical History:  Diagnosis Date   CAD (coronary artery disease)    Chronic kidney disease    Colon polyps    COPD (chronic obstructive pulmonary disease) (HCC)    COVID-19    05/2019   GERD (gastroesophageal reflux disease)    Heart murmur    History of kidney stones    HIV (human immunodeficiency virus infection) (HCC) 1999   Hypertension    Pneumonia    Stroke (HCC)    right side weakness   TIA (transient ischemic attack)    Past Surgical History:  Procedure Laterality Date   CATARACT EXTRACTION W/ INTRAOCULAR LENS IMPLANT Bilateral    CHOLECYSTECTOMY  1982   COLONOSCOPY     ESOPHAGEAL MANOMETRY N/A 09/03/2017   Procedure: ESOPHAGEAL MANOMETRY (EM);  Surgeon: Napoleon Form, MD;  Location: [facility];  Service: Endoscopy;  Laterality: N/A;   IR ANGIO INTRA EXTRACRAN SEL COM CAROTID INNOMINATE BILAT MOD SED  04/21/2019   IR ANGIO INTRA EXTRACRAN SEL COM CAROTID INNOMINATE BILAT MOD SED  05/11/2020   IR ANGIO VERTEBRAL SEL VERTEBRAL BILAT MOD SED  04/21/2019   IR ANGIO VERTEBRAL SEL VERTEBRAL BILAT MOD SED  05/11/2020   IR PTA INTRACRANIAL  05/14/2019   IR US GUIDE VASC ACCESS RIGHT  05/11/2020   KNEE SURGERY     LEFT KNEE   RADIOLOGY WITH ANESTHESIA N/A 05/14/2019   Procedure: IR WITH ANESTHESIA STENTING;  Surgeon: Julieanne Cotton, MD;  Location: [facility];  Service: Radiology;  Laterality: N/A;    TONSILLECTOMY     Patient Active Problem List   Diagnosis Date Noted   Encounter for immunization 04/06/2023   Medication management 04/03/2023   Health care maintenance 04/03/2023   Screening for STDs (sexually transmitted diseases) 04/03/2023   COPD (chronic obstructive pulmonary disease) (HCC) 08/24/2022   Abnormal CT of the chest 08/24/2022   Dizziness 09/07/2021   Blurry vision, bilateral 09/07/2021   Bilateral occipital neuralgia 04/06/2021   Gynecomastia 02/28/2021   Proteinuria 09/23/2020   Nocturnal polyuria 09/21/2020   Meralgia paresthetica, left lower limb 09/21/2020   Dehydration    Infectious colitis    Rib pain 07/13/2020   Syphilis (acquired) 06/22/2020   Rectal abnormality 05/27/2020   Neck pain 12/23/2019   Other headache syndrome 12/23/2019   Occipital neuralgia of right side 12/23/2019   CAD (coronary artery disease) 10/27/2019   Internal carotid artery stenosis, right 05/14/2019   Peripheral vertigo 11/29/2018   Chronic kidney disease 12/20/2017   Diastolic CHF with preserved left ventricular function, NYHA class 2 (HCC) 01/06/2015   Dyspnea on exertion 09/30/2014   Insomnia 07/22/2014   BPH with obstruction/lower urinary tract symptoms 06/17/2014   Hyperlipidemia 07/22/2012   Erectile dysfunction 07/22/2012   GERD 05/17/2010  Dysphagia 03/08/2009   Enlarged lymph nodes 09/19/2007   CHOLECYSTECTOMY, HX OF 05/28/2006   Human immunodeficiency virus (HIV) disease (HCC) 02/27/2006   Essential hypertension 02/27/2006   History of respiratory system disease 02/27/2006   ARTHROSCOPY, KNEE, HX OF 02/27/2006    PCP: Levin Erp, MD  REFERRING PROVIDER: Nestor Ramp, MD   REFERRING DIAG: Osteoarthritis of both hips, unspecified osteoarthritis type [M16.0], Chronic pain of left knee [M25.562, G89.29]   Rationale for Evaluation and Treatment: Rehabilitation  THERAPY DIAG:  Pain of both hip joints  Chronic pain of both knees  ONSET DATE:  06/06/2023 date of referral   SUBJECTIVE:                                                                                                                                                                                           SUBJECTIVE STATEMENT: 08/02/2023 Patient reports that he has left hip soreness today after being more active this week while his daughter was visiting.   EVAL: Patient reports to PT with L>R hip and knee pain that is worse depending on weather, prolonged walking for grocery stopping, and otherwise hard for patient to identify the pattern.   PERTINENT HISTORY:  Relevant PMHx includes CAD, CKD, COPD, HIV, HTN, Stroke with subsequent right sided weakness, TIA, hx of knee arthroscopy    PAIN:  Are you having pain?  Yes: NPRS scale: 0/10 at best, 10/10 at worst Pain location: left >right hip, left knee  Pain description: unable  Aggravating factors: prolonged standing/walking, weather Relieving factors: rest  PRECAUTIONS: None  RED FLAGS: None   WEIGHT BEARING RESTRICTIONS: No  FALLS:  Has patient fallen in last 6 months? No  LIVING ENVIRONMENT: Lives with: lives alone Lives in: House/apartment Stairs: No Has following equipment at home: None  OCCUPATION: Retired   PLOF: Independent with basic ADLs and Independent with community mobility without device  PATIENT GOALS: "I just want to stay active as long/much as I can."   NEXT MD VISIT: 07/30/23 with family medicine   OBJECTIVE:  Note: Objective measures were completed at Evaluation unless otherwise noted.  DIAGNOSTIC FINDINGS:   As found in office note from referring provider:   XRAYS:  Right hip x-ray and left hip x-ray 2 views 06/05/23 with pelvis personally reviewed and interpreted by me today showing: -Mild to moderate right osteoarthritis of the hip joint -Mild left osteoarthritis of the hip joint -CAM-type FAI bilaterally, more prominent on the right compared to the left   Left knee x-ray  3 views, along with bilateral knee standing AP views 06/05/2023 personally reviewed and interpreted by me today showing: -  Left knee with mild patellofemoral spurring consistent with mild osteoarthritis.  Some of these arthropathic changes at the quadriceps insertion on the patella as well.  No other new abnormalities -Single view of the right knee shows no acute abnormalities    PATIENT SURVEYS:  LEFS 33/80 = 41% perceived function Most difficulty with getting in/out of vehicles, prolonged walking, going up and down stairs, rolling over in bed  COGNITION: Overall cognitive status: Within functional limits for tasks assessed     SENSATION: Not tested   POSTURE: rounded shoulders, forward head, and decreased lumbar lordosis   LUMBAR ROM:  grossly 75% of full AROM in all directions   AROM eval  Flexion   Extension   Right lateral flexion   Left lateral flexion   Right rotation   Left rotation    (Blank rows = not tested)  LOWER EXTREMITY ROM:     Active  Right eval Left eval  Hip flexion    Hip extension    Hip abduction    Hip adduction    Hip internal rotation    Hip external rotation    Knee flexion    Knee extension    Ankle dorsiflexion    Ankle plantarflexion    Ankle inversion    Ankle eversion     (Blank rows = not tested)  LOWER EXTREMITY MMT:    MMT Right eval Left eval  Hip flexion 5 5  Hip extension    Hip abduction 4+ 5  Hip adduction    Hip internal rotation    Hip external rotation    Knee flexion    Knee extension 5 5  Ankle dorsiflexion    Ankle plantarflexion    Ankle inversion    Ankle eversion     (Blank rows = not tested)   LOWER EXTREMITY SPECIAL TESTS:  Hip special tests: Luisa Hart (FABER) test: negative and Anterior hip impingement test: negative     TREATMENT DATE:   [facility] Adult PT Treatment:                                                DATE: 08/02/23  Therapeutic Exercise: nu-step L5 x 62m  [facility] red physioball x  LTR to right 6 repetitions x 5 sec with OP from clinician 90-90 abdominal hold 10'' x4 SLR - 2x10 ea Seated clam - RTB - 2x10  Therapeutic Activity:  STS x10 arms crossed (some SOB) RTB for resisted hip abduction  Seated therapeutic rest breaks as needed to manage muscular fatigue and COPD symptoms Bridge on ball - 15x 10''  [facility] Adult PT Treatment:                                                DATE: 07/31/23  Therapeutic Exercise: nu-step L5 x 52m  DTKC red physioball 1' x2 with PT OP LTR on red physioball 20x 90-90 abdominal hold 10'' x4 SLR - 2x10 ea S/L clam - YTB - 2x10  Therapeutic Activity:  STS 2x10 arms crossed (some SOB) Seated therapeutic rest breaks as needed to manage muscular fatigue and COPD symptoms Bridge on ball - 10x 10''         HOME EXERCISE PROGRAM: Access Code:  [email or web address] URL: [email or web address] Date: 07/11/2023 Prepared by: Alphonzo Severance  Exercises - Supine Lower Trunk Rotation  - 1 x daily - 7 x weekly - 2 sets - 10 reps - 3 sec hold - Supine Quad Set  - 3 x daily - 7 x weekly - 2 sets - 10 reps - 10 second hold - Active Straight Leg Raise with Quad Set  - 1 x daily - 7 x weekly - 3 sets - 10 reps - Supine Bridge  - 1 x daily - 7 x weekly - 3 sets - 10 reps - 2-3 seconds hold  ASSESSMENT:  CLINICAL IMPRESSION: 08/02/2023  Jeffrey [name] continues to tolerate progression of core and hip strengthening program well. He requires additional time for therapeutic rest breaks d/t COPD related fatigue. Plan is to continue progressing in order to reach established rehab goals for improved functional capacity.   Eval: Jeffrey [name] is a 78 y.o. male  who was seen today for physical therapy evaluation and treatment for Left hip and knee pain. His pain is likely related to mild-to-moderate L hip and knee OA. He has negative results for FAI special test at time of evaluation. He has related pain and difficulty with prolonged standing, walking, car  transfers and otherwise decreased activity tolerance d/t pain. He requires skilled PT services at this time to address relevant deficits and improve overall function.     OBJECTIVE IMPAIRMENTS: decreased activity tolerance, decreased strength, improper body mechanics, and pain.   ACTIVITY LIMITATIONS: lifting, standing, stairs, transfers, and bed mobility  PARTICIPATION LIMITATIONS: cleaning, shopping, and community activity  PERSONAL FACTORS: Age, Time since onset of injury/illness/exacerbation, and 3+ comorbidities: Relevant PMHx includes CAD, CKD, COPD, HIV, HTN, Stroke with subsequent right sided weakness, TIA, hx of knee arthroscopy   are also affecting patient's functional outcome.   REHAB POTENTIAL: Fair  CLINICAL DECISION MAKING: Evolving/moderate complexity  EVALUATION COMPLEXITY: Moderate   GOALS: Goals reviewed with patient? Yes  SHORT TERM GOALS: Target date: 07/20/2023   Patient will be independent with initial home program for hip mobility and LE stretches.  Baseline: provided at eval  Goal status: MET Pt reports adherence 07/19/23   LONG TERM GOALS: Target date: 08/10/2023   Patient will report improved overall functional ability with LEFS score of 50 or greater  Baseline: 33/80 Goal status: INITIAL  2.  Patient will demonstrate ability to perform standing and walking activities >30 minutes without exacerbation of symptoms.  Baseline: poor tolerance Goal status: INITIAL  3.  Patient will report ability to perform car transfers with proper body mechanics and no more than 3/10 LE pain.  Baseline: >5/10 pain, moderate-to-severe difficulty  Goal status: INITIAL  4.  Patient will demonstrate ability to perform bed mobility activities with proper body mechanics and no more then 3/10 LE pain.  Baseline: >5/10 pain, moderate-to-severe difficulty  Goal status: INITIAL   PLAN:  PT FREQUENCY: 1-2x/week  PT DURATION: 6 weeks  PLANNED INTERVENTIONS: 97164- PT  Re-evaluation, 97110-Therapeutic exercises, 97530- Therapeutic activity, 97112- Neuromuscular re-education, 97535- Self Care, 47829- Manual therapy, [phone]- Aquatic Therapy, [id number]- Electrical stimulation (unattended), Patient/Family education, Taping, Dry Needling, Joint mobilization, Spinal mobilization, Cryotherapy, and Moist heat.  PLAN FOR NEXT SESSION: LE mobility, aerobic activities, isometric strengthening, PNF strengthening, joint distraction/mob, bed mobility training, car transfer training  Mauri Reading, PT, DPT  08/02/2023 1:00 PM

## 2023-08-07 ENCOUNTER — Ambulatory Visit: Payer: Medicare HMO | Attending: Family Medicine | Admitting: Physical Therapy

## 2023-08-07 ENCOUNTER — Encounter: Payer: Self-pay | Admitting: Physical Therapy

## 2023-08-07 DIAGNOSIS — M25551 Pain in right hip: Secondary | ICD-10-CM | POA: Diagnosis not present

## 2023-08-07 DIAGNOSIS — G8929 Other chronic pain: Secondary | ICD-10-CM | POA: Insufficient documentation

## 2023-08-07 DIAGNOSIS — M25561 Pain in right knee: Secondary | ICD-10-CM | POA: Diagnosis not present

## 2023-08-07 DIAGNOSIS — M25552 Pain in left hip: Secondary | ICD-10-CM | POA: Insufficient documentation

## 2023-08-07 DIAGNOSIS — M25562 Pain in left knee: Secondary | ICD-10-CM | POA: Diagnosis not present

## 2023-08-07 NOTE — Therapy (Signed)
 PHYSICAL THERAPY DISCHARGE SUMMARY  Visits from Start of Care: 10  Current functional level related to goals / functional outcomes: See assessment/goals   Remaining deficits: See assessment/goals   Education / Equipment: HEP and D/C plans  Patient agrees to discharge. Patient goals were met. Patient is being discharged due to meeting the stated rehab goals.     Patient Name: Jeffrey [name] MRN: [id number] DOB:1945/11/27, 78 y.o., male Today's Date: 08/07/2023  END OF SESSION:  PT End of Session - 08/07/23 1302     Visit Number 10    Number of Visits 10    Date for PT Re-Evaluation 08/10/23    Authorization Type [name] MCR/MCD    Progress Note Due on Visit 20    PT Start Time 1300    PT Stop Time 1341    PT Time Calculation (min) 41 min    Activity Tolerance Patient tolerated treatment well    Behavior During Therapy [facility] for tasks assessed/performed               Past Medical History:  Diagnosis Date   CAD (coronary artery disease)    Chronic kidney disease    Colon polyps    COPD (chronic obstructive pulmonary disease) (HCC)    COVID-19    05/2019   GERD (gastroesophageal reflux disease)    Heart murmur    History of kidney stones    HIV (human immunodeficiency virus infection) (HCC) 1999   Hypertension    Pneumonia    Stroke (HCC)    right side weakness   TIA (transient ischemic attack)    Past Surgical History:  Procedure Laterality Date   CATARACT EXTRACTION W/ INTRAOCULAR LENS IMPLANT Bilateral    CHOLECYSTECTOMY  1982   COLONOSCOPY     ESOPHAGEAL MANOMETRY N/A 09/03/2017   Procedure: ESOPHAGEAL MANOMETRY (EM);  Surgeon: Napoleon Form, MD;  Location: [facility];  Service: Endoscopy;  Laterality: N/A;   IR ANGIO INTRA EXTRACRAN SEL COM CAROTID INNOMINATE BILAT MOD SED  04/21/2019   IR ANGIO INTRA EXTRACRAN SEL COM CAROTID INNOMINATE BILAT MOD SED  05/11/2020   IR ANGIO VERTEBRAL SEL VERTEBRAL BILAT MOD SED  04/21/2019   IR ANGIO  VERTEBRAL SEL VERTEBRAL BILAT MOD SED  05/11/2020   IR PTA INTRACRANIAL  05/14/2019   IR US GUIDE VASC ACCESS RIGHT  05/11/2020   KNEE SURGERY     LEFT KNEE   RADIOLOGY WITH ANESTHESIA N/A 05/14/2019   Procedure: IR WITH ANESTHESIA STENTING;  Surgeon: Julieanne Cotton, MD;  Location: [facility] OR;  Service: Radiology;  Laterality: N/A;   TONSILLECTOMY     Patient Active Problem List   Diagnosis Date Noted   Encounter for immunization 04/06/2023   Medication management 04/03/2023   Health care maintenance 04/03/2023   Screening for STDs (sexually transmitted diseases) 04/03/2023   COPD (chronic obstructive pulmonary disease) (HCC) 08/24/2022   Abnormal CT of the chest 08/24/2022   Dizziness 09/07/2021   Blurry vision, bilateral 09/07/2021   Bilateral occipital neuralgia 04/06/2021   Gynecomastia 02/28/2021   Proteinuria 09/23/2020   Nocturnal polyuria 09/21/2020   Meralgia paresthetica, left lower limb 09/21/2020   Dehydration    Infectious colitis    Rib pain 07/13/2020   Syphilis (acquired) 06/22/2020   Rectal abnormality 05/27/2020   Neck pain 12/23/2019   Other headache syndrome 12/23/2019   Occipital neuralgia of right side 12/23/2019   CAD (coronary artery disease) 10/27/2019   Internal carotid artery stenosis, right 05/14/2019  Peripheral vertigo 11/29/2018   Chronic kidney disease 12/20/2017   Diastolic CHF with preserved left ventricular function, NYHA class 2 (HCC) 01/06/2015   Dyspnea on exertion 09/30/2014   Insomnia 07/22/2014   BPH with obstruction/lower urinary tract symptoms 06/17/2014   Hyperlipidemia 07/22/2012   Erectile dysfunction 07/22/2012   GERD 05/17/2010   Dysphagia 03/08/2009   Enlarged lymph nodes 09/19/2007   CHOLECYSTECTOMY, HX OF 05/28/2006   Human immunodeficiency virus (HIV) disease (HCC) 02/27/2006   Essential hypertension 02/27/2006   History of respiratory system disease 02/27/2006   ARTHROSCOPY, KNEE, HX OF 02/27/2006    PCP:  Levin Erp, MD  REFERRING PROVIDER: Nestor Ramp, MD   REFERRING DIAG: Osteoarthritis of both hips, unspecified osteoarthritis type [M16.0], Chronic pain of left knee [M25.562, G89.29]   Rationale for Evaluation and Treatment: Rehabilitation  THERAPY DIAG:  Pain of both hip joints  Chronic pain of both knees  ONSET DATE: 06/06/2023 date of referral   SUBJECTIVE:                                                                                                                                                                                           SUBJECTIVE STATEMENT: 08/07/2023 Pt reports that he was doing some yard work yesterday and his hip is a bit sore.  EVAL: Patient reports to PT with L>R hip and knee pain that is worse depending on weather, prolonged walking for grocery stopping, and otherwise hard for patient to identify the pattern.   PERTINENT HISTORY:  Relevant PMHx includes CAD, CKD, COPD, HIV, HTN, Stroke with subsequent right sided weakness, TIA, hx of knee arthroscopy    PAIN:  Are you having pain?  Yes: NPRS scale: 0/10 at best, 10/10 at worst Pain location: left >right hip, left knee  Pain description: unable  Aggravating factors: prolonged standing/walking, weather Relieving factors: rest  PRECAUTIONS: None  RED FLAGS: None   WEIGHT BEARING RESTRICTIONS: No  FALLS:  Has patient fallen in last 6 months? No  LIVING ENVIRONMENT: Lives with: lives alone Lives in: House/apartment Stairs: No Has following equipment at home: None  OCCUPATION: Retired   PLOF: Independent with basic ADLs and Independent with community mobility without device  PATIENT GOALS: "I just want to stay active as long/much as I can."   NEXT MD VISIT: 07/30/23 with family medicine   OBJECTIVE:  Note: Objective measures were completed at Evaluation unless otherwise noted.  DIAGNOSTIC FINDINGS:   As found in office note from referring provider:   XRAYS:  Right hip x-ray  and left hip x-ray 2 views 06/05/23 with pelvis personally reviewed and interpreted by me  today showing: -Mild to moderate right osteoarthritis of the hip joint -Mild left osteoarthritis of the hip joint -CAM-type FAI bilaterally, more prominent on the right compared to the left   Left knee x-ray 3 views, along with bilateral knee standing AP views 06/05/2023 personally reviewed and interpreted by me today showing: -Left knee with mild patellofemoral spurring consistent with mild osteoarthritis.  Some of these arthropathic changes at the quadriceps insertion on the patella as well.  No other new abnormalities -Single view of the right knee shows no acute abnormalities    PATIENT SURVEYS:  LEFS 33/80 = 41% perceived function Most difficulty with getting in/out of vehicles, prolonged walking, going up and down stairs, rolling over in bed  COGNITION: Overall cognitive status: Within functional limits for tasks assessed     SENSATION: Not tested   POSTURE: rounded shoulders, forward head, and decreased lumbar lordosis   LUMBAR ROM:  grossly 75% of full AROM in all directions   AROM eval  Flexion   Extension   Right lateral flexion   Left lateral flexion   Right rotation   Left rotation    (Blank rows = not tested)  LOWER EXTREMITY ROM:     Active  Right eval Left eval  Hip flexion    Hip extension    Hip abduction    Hip adduction    Hip internal rotation    Hip external rotation    Knee flexion    Knee extension    Ankle dorsiflexion    Ankle plantarflexion    Ankle inversion    Ankle eversion     (Blank rows = not tested)  LOWER EXTREMITY MMT:    MMT Right eval Left eval  Hip flexion 5 5  Hip extension    Hip abduction 4+ 5  Hip adduction    Hip internal rotation    Hip external rotation    Knee flexion    Knee extension 5 5  Ankle dorsiflexion    Ankle plantarflexion    Ankle inversion    Ankle eversion     (Blank rows = not tested)   LOWER  EXTREMITY SPECIAL TESTS:  Hip special tests: Luisa Hart (FABER) test: negative and Anterior hip impingement test: negative     TREATMENT DATE:   [facility] Adult PT Treatment:                                                DATE: 08/07/23  Therapeutic Exercise: nu-step L5 x 10m  LTR to right 6 repetitions x 5 sec with OP from clinician 90-90 abdominal hold 10'' x4 S/L clam - 2x10  Therapeutic Activity:  STS 2x10 Checking goals and reviewing with pt; D/C planning Bridge on ball - 15x 10''  HOME EXERCISE PROGRAM: Access Code: [email or web address] URL: [email or web address] Date: 08/07/2023 Prepared by: Alphonzo Severance  Exercises - Supine Lower Trunk Rotation  - 1 x daily - 7 x weekly - 2 sets - 10 reps - 3 sec hold - Active Straight Leg Raise with Quad Set  - 1 x daily - 7 x weekly - 3 sets - 10 reps - Supine Bridge  - 1 x daily - 7 x weekly - 3 sets - 10 reps - 2-3 seconds hold - Clamshell with Resistance  - 1 x daily - 7 x weekly - 3 sets - 10 reps -  Supine 90/90 Abdominal Bracing  - 1 x daily - 7 x weekly - 1 sets - 10 reps - 10'' hold - Sit to Stand Without Arm Support  - 1 x daily - 7 x weekly - 2-3 sets - 10 reps  ASSESSMENT:  CLINICAL IMPRESSION: 08/07/2023  [place] has made significant progress since starting PT.  He reports significant improvement in his hip pain.  He has met all long term goals.  He is confident that he can continue to perform strength exercises independently and plans to walk regularly now that the weather is a bit nicer.  He will D/C home with HEP; he agrees with plan.  Eval: Pate is a 78 y.o. male  who was seen today for physical therapy evaluation and treatment for Left hip and knee pain. His pain is likely related to mild-to-moderate L hip and knee OA. He has negative results for FAI special test at time of evaluation. He has related pain and difficulty with prolonged standing, walking, car transfers and otherwise decreased activity tolerance d/t pain. He  requires skilled PT services at this time to address relevant deficits and improve overall function.     OBJECTIVE IMPAIRMENTS: decreased activity tolerance, decreased strength, improper body mechanics, and pain.   ACTIVITY LIMITATIONS: lifting, standing, stairs, transfers, and bed mobility  PARTICIPATION LIMITATIONS: cleaning, shopping, and community activity  PERSONAL FACTORS: Age, Time since onset of injury/illness/exacerbation, and 3+ comorbidities: Relevant PMHx includes CAD, CKD, COPD, HIV, HTN, Stroke with subsequent right sided weakness, TIA, hx of knee arthroscopy   are also affecting patient's functional outcome.   REHAB POTENTIAL: Fair  CLINICAL DECISION MAKING: Evolving/moderate complexity  EVALUATION COMPLEXITY: Moderate   GOALS: Goals reviewed with patient? Yes  SHORT TERM GOALS: Target date: 07/20/2023   Patient will be independent with initial home program for hip mobility and LE stretches.  Baseline: provided at eval  Goal status: MET Pt reports adherence 07/19/23   LONG TERM GOALS: Target date: 08/10/2023   Patient will report improved overall functional ability with LEFS score of 50 or greater  Baseline: 33/80 4/1: 56/80 Goal status: MET  2.  Patient will demonstrate ability to perform standing and walking activities >30 minutes without exacerbation of symptoms.  Baseline: poor tolerance 4/1: MET Goal status: MET  3.  Patient will report ability to perform car transfers with proper body mechanics and no more than 3/10 LE pain.  Baseline: >5/10 pain, moderate-to-severe difficulty  4/1: no pain, still some difficulty  Goal status: MET  4.  Patient will demonstrate ability to perform bed mobility activities with proper body mechanics and no more then 3/10 LE pain.  Baseline: >5/10 pain, moderate-to-severe difficulty  4/1: MET Goal status: MET   PLAN:  PT FREQUENCY: 1-2x/week  PT DURATION: 6 weeks  PLANNED INTERVENTIONS: 97164- PT Re-evaluation,  97110-Therapeutic exercises, 97530- Therapeutic activity, 97112- Neuromuscular re-education, 97535- Self Care, 16109- Manual therapy, [id number]- Aquatic Therapy, [id number]- Electrical stimulation (unattended), Patient/Family education, Taping, Dry Needling, Joint mobilization, Spinal mobilization, Cryotherapy, and Moist heat.  PLAN FOR NEXT SESSION: LE mobility, aerobic activities, isometric strengthening, PNF strengthening, joint distraction/mob, bed mobility training, car transfer training  Fredderick Phenix PT 08/07/2023 1:44 PM

## 2023-08-09 ENCOUNTER — Ambulatory Visit: Payer: Medicare HMO

## 2023-08-13 ENCOUNTER — Other Ambulatory Visit: Payer: Self-pay | Admitting: Emergency Medicine

## 2023-08-14 ENCOUNTER — Ambulatory Visit: Payer: Medicare HMO | Admitting: Physical Therapy

## 2023-08-18 ENCOUNTER — Other Ambulatory Visit: Payer: Self-pay | Admitting: Cardiovascular Disease

## 2023-09-06 DIAGNOSIS — Q1389 Other congenital malformations of anterior segment of eye: Secondary | ICD-10-CM | POA: Diagnosis not present

## 2023-09-06 DIAGNOSIS — H35371 Puckering of macula, right eye: Secondary | ICD-10-CM | POA: Diagnosis not present

## 2023-09-06 DIAGNOSIS — H43813 Vitreous degeneration, bilateral: Secondary | ICD-10-CM | POA: Diagnosis not present

## 2023-09-06 DIAGNOSIS — Z961 Presence of intraocular lens: Secondary | ICD-10-CM | POA: Diagnosis not present

## 2023-09-19 ENCOUNTER — Ambulatory Visit (INDEPENDENT_AMBULATORY_CARE_PROVIDER_SITE_OTHER): Admitting: Emergency Medicine

## 2023-09-19 ENCOUNTER — Other Ambulatory Visit: Payer: Self-pay

## 2023-09-19 ENCOUNTER — Other Ambulatory Visit: Payer: Medicare HMO

## 2023-09-19 ENCOUNTER — Encounter: Payer: Self-pay | Admitting: Emergency Medicine

## 2023-09-19 VITALS — BP 160/94 | HR 77 | Ht 70.0 in | Wt 177.2 lb

## 2023-09-19 DIAGNOSIS — B20 Human immunodeficiency virus [HIV] disease: Secondary | ICD-10-CM

## 2023-09-19 DIAGNOSIS — Z113 Encounter for screening for infections with a predominantly sexual mode of transmission: Secondary | ICD-10-CM | POA: Diagnosis not present

## 2023-09-19 DIAGNOSIS — Z79899 Other long term (current) drug therapy: Secondary | ICD-10-CM | POA: Diagnosis not present

## 2023-09-19 DIAGNOSIS — R9389 Abnormal findings on diagnostic imaging of other specified body structures: Secondary | ICD-10-CM | POA: Diagnosis not present

## 2023-09-19 DIAGNOSIS — J449 Chronic obstructive pulmonary disease, unspecified: Secondary | ICD-10-CM

## 2023-09-19 MED ORDER — ALBUTEROL SULFATE HFA 108 (90 BASE) MCG/ACT IN AERS: 2.0000 | INHALATION_SPRAY | Freq: Four times a day (QID) | RESPIRATORY_TRACT | 6 refills | Status: AC | PRN

## 2023-09-19 MED ORDER — STIOLTO RESPIMAT 2.5-2.5 MCG/ACT IN AERS: 2.0000 | INHALATION_SPRAY | Freq: Once | RESPIRATORY_TRACT | 0 refills | Status: AC

## 2023-09-19 NOTE — Progress Notes (Signed)
 Subjective:    Patient ID: Jeffrey [name], male    DOB: 1945-10-15, 78 y.o.   MRN: [id number]  HPI  ROV 08/24/2022 --follow-up visit for 78 year old gentleman, never smoker for evaluation of mild obstructive lung disease and shortness of breath.  His past medical history is also significant for HIV, CKD, hypertension, 2 bouts of COVID-19 in 2021 and then 09/2020.  He has mild obstruction on pulmonary function testing and some evidence for possible tracheobronchomalacia and mild bilateral bronchiectasis on imaging.  At his last visit in October 2023 I started him on Stiolto to see if he would get benefit.  He had to be seen in the emergency department 04/2022 with acute cough and chest congestion.  He was treated with a brief course of prednisone . He is back to baseline - has exertional sob, happens w yardwork. He benefits from albuterol , averages 1x a day. He is sure that he is benefiting from [employer].   ROV 09/19/2023 --78 year old man, never smoker with a history of mild obstructive lung disease and associated shortness of breath.  Also with CKD, HIV, hypertension, history of COVID-19 last in 09/2020.  He has some evidence of possible tracheobronchomalacia on chest imaging with some associated bilateral bronchiectasis. We have been managing him on Stiolto, albuterol  as needed.  Today he reports that he has some ups and downs. He gets more SOB w talking, exerting. Has to rest for 15 minutes. Still works in garden and outside. He has dry cough often w a lot of talking, not a daily thing. No sputum. He uses albuterol  most days with good benefit. He has never seen any desats on RA.     Review of Systems As per HPI  Past Medical History:  Diagnosis Date   CAD (coronary artery disease)    Chronic kidney disease    Colon polyps    COPD (chronic obstructive pulmonary disease) (HCC)    COVID-19    05/2019   GERD (gastroesophageal reflux disease)    Heart murmur    History of kidney stones    HIV  (human immunodeficiency virus infection) (HCC) 1999   Hypertension    Pneumonia    Stroke (HCC)    right side weakness   TIA (transient ischemic attack)      Family History  Problem Relation Age of Onset   Hypertension Mother    Heart attack Mother 41   Alzheimer's disease Sister    Heart disease Sister    Alzheimer's disease Sister    Drug abuse Sister    COPD Brother    Kidney cancer Brother    Colon cancer Neg Hx    Esophageal cancer Neg Hx    Pancreatic cancer Neg Hx    Stomach cancer Neg Hx    Liver disease Neg Hx      Social History   Socioeconomic History   Marital status: Divorced    Spouse name: Not on file   Number of children: 2   Years of education: 14   Highest education level: Some college, no degree  Occupational History   Occupation: retired    Associate Professor: [employer]  Tobacco Use   Smoking status: Never   Smokeless tobacco: Former    Types: Chew    Quit date: 2019  Vaping Use   Vaping status: Never Used  Substance and Sexual Activity   Alcohol use: Yes    Comment: rare - few per year   Drug use: No   Sexual activity:  Not Currently    Partners: Female    Comment: declined condoms  Other Topics Concern   Not on file  Social History Narrative   Lives alone   Right handed    Caffeine use: tea, mainly water      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, English as a foreign language   2 children - 1 boy and 1 girl   Social Drivers of Corporate investment banker Strain: Low Risk  (07/30/2023)   Overall Financial Resource Strain (CARDIA)    Difficulty of Paying Living Expenses: Not hard at all  Food Insecurity: No Food Insecurity (07/30/2023)   Hunger Vital Sign    Worried About Running Out of Food in the Last Year: Never true    Ran Out of Food in the Last Year: Never true  Transportation Needs: No Transportation Needs (07/30/2023)   PRAPARE - Administrator, Civil Service (Medical): No    Lack of Transportation (Non-Medical): No  Physical Activity: Sufficiently  Active (07/30/2023)   Exercise Vital Sign    Days of Exercise per Week: 7 days    Minutes of Exercise per Session: 60 min  Stress: No Stress Concern Present (07/30/2023)   [employer] of Occupational Health - Occupational Stress Questionnaire    Feeling of Stress : Not at all  Social Connections: Socially Isolated (07/30/2023)   Social Connection and Isolation Panel [NHANES]    Frequency of Communication with Friends and Family: More than three times a week    Frequency of Social Gatherings with Friends and Family: Once a week    Attends Religious Services: Never    Database administrator or Organizations: Patient declined    Attends Banker Meetings: Never    Marital Status: Divorced  Catering manager Violence: Not At Risk (07/30/2023)   Humiliation, Afraid, Rape, and Kick questionnaire    Fear of Current or Ex-Partner: No    Emotionally Abused: No    Physically Abused: No    Sexually Abused: No     Allergies  Allergen Reactions   Baclofen  Other (See Comments)    Back pain. Patient said it made him feel crazy    Efavirenz  Rash   Nevirapine Rash and Other (See Comments)     Outpatient Medications Prior to Visit  Medication Sig Dispense Refill   abacavir -dolutegravir -lamiVUDine  (TRIUMEQ ) 600-50-300 MG tablet Take 1 tablet by mouth daily. 30 tablet 11   Ascorbic Acid (VITAMIN C) 1000 MG tablet Take 1,000 mg by mouth daily at 6 PM.     aspirin  EC 81 MG tablet Take 1 tablet (81 mg total) by mouth daily.     Cholecalciferol (VITAMIN D3) 125 MCG (5000 UT) TABS Take 5,000 Units by mouth daily at 6 PM.     clopidogrel  (PLAVIX ) 75 MG tablet Take 1 tablet by mouth once daily 90 tablet 0   finasteride  (PROSCAR ) 5 MG tablet Take 5 mg by mouth in the morning and at bedtime.     losartan (COZAAR) 50 MG tablet Take 50 mg by mouth daily. Started by his kidney doctor- Dr. Zelda Hickman - [facility]     pantoprazole  (PROTONIX ) 20 MG tablet Take 1 tablet by mouth twice daily 180  tablet 2   rosuvastatin  (CRESTOR ) 20 MG tablet Take 1 tablet by mouth once daily 90 tablet 3   sildenafil (REVATIO) 20 MG tablet Take 20 mg by mouth as needed.     solifenacin (VESICARE) 10 MG tablet Take by mouth.  sucralfate  (CARAFATE ) 1 GM/10ML suspension Take 10 mLs (1 g total) by mouth every 6 (six) hours as needed. 420 mL 1   vitamin B-12 (CYANOCOBALAMIN) 500 MCG tablet Take 500 mcg by mouth daily.     Zinc 50 MG TABS Take 50 mg by mouth daily at 6 PM.     albuterol  (VENTOLIN  HFA) 108 (90 Base) MCG/ACT inhaler INHALE 2 PUFFS BY MOUTH EVERY 6 HOURS AS NEEDED FOR WHEEZING FOR SHORTNESS OF BREATH 9 g 2   STIOLTO RESPIMAT  2.5-2.5 MCG/ACT AERS INHALE 2 PUFFS BY MOUTH ONCE DAILY 4 g 0   No facility-administered medications prior to visit.         Objective:   Physical Exam  Vitals:   09/19/23 1439 09/19/23 1440  BP: (!) 146/91 (!) 160/94  Pulse: 77   SpO2: 98%   Weight: 177 lb 3.2 oz (80.4 kg)   Height: 5\' 10"  (1.778 m)    Gen: Pleasant, well-nourished, in no distress,  normal affect  ENT: No lesions,  mouth clear,  oropharynx clear, no postnasal drip  Neck: No JVD, no stridor  Lungs: No use of accessory muscles, no crackles or wheezing on normal respiration, no wheeze on forced expiration  Cardiovascular: RRR, heart sounds normal, no murmur or gallops, no peripheral edema  Musculoskeletal: No deformities, no cyanosis or clubbing  Neuro: alert, awake, non focal  Skin: Warm, no lesions or rash     Assessment & Plan:  COPD (chronic obstructive pulmonary disease) (HCC) Presumed fixed asthma, question also component of obstructive change due to his HIV.  He has some slight change in his exertional dyspnea, still remains functional.  We talked about options and for now we will continue same regimen, defer PFT.  We reviewed your symptoms and your medications today.  For now we will continue Stiolto 2 puffs once daily.  Depending on how your breathing is doing we may  decide to repeat your pulmonary function testing at some point going forward.  You may also be a candidate to change your inhaler or to add a new nebulizer treatment called Ohtuvayre .  We can talk more about this next time. Keep albuterol  available use 2 puffs if needed for shortness of breath, chest tightness, wheezing. Please call if you develop any changes in respiratory symptoms, any worsening shortness of breath. Follow with Dr. Baldwin Levee in 1 year  Abnormal CT of the chest With some bronchiectatic change, bronchomalacia.  He is asymptomatic.  We are holding off on any repeat imaging for now.  Consider if he has a clinical change     Racheal Buddle, MD, PhD 09/19/2023, 2:56 PM [facility] [phone] or if no answer before 7:00PM call [phone] For any issues after 7:00PM please call eLink [phone]

## 2023-09-19 NOTE — Patient Instructions (Signed)
 We reviewed your symptoms and your medications today.  For now we will continue Stiolto 2 puffs once daily.  Depending on how your breathing is doing we may decide to repeat your pulmonary function testing at some point going forward.  You may also be a candidate to change your inhaler or to add a new nebulizer treatment called Ohtuvayre .  We can talk more about this next time. Keep albuterol  available use 2 puffs if needed for shortness of breath, chest tightness, wheezing. Please call if you develop any changes in respiratory symptoms, any worsening shortness of breath. Follow with Dr. Baldwin Levee in 1 year

## 2023-09-19 NOTE — Assessment & Plan Note (Signed)
 Presumed fixed asthma, question also component of obstructive change due to his HIV.  He has some slight change in his exertional dyspnea, still remains functional.  We talked about options and for now we will continue same regimen, defer PFT.  We reviewed your symptoms and your medications today.  For now we will continue Stiolto 2 puffs once daily.  Depending on how your breathing is doing we may decide to repeat your pulmonary function testing at some point going forward.  You may also be a candidate to change your inhaler or to add a new nebulizer treatment called Ohtuvayre .  We can talk more about this next time. Keep albuterol  available use 2 puffs if needed for shortness of breath, chest tightness, wheezing. Please call if you develop any changes in respiratory symptoms, any worsening shortness of breath. Follow with Dr. Baldwin Levee in 1 year

## 2023-09-19 NOTE — Assessment & Plan Note (Signed)
 With some bronchiectatic change, bronchomalacia.  He is asymptomatic.  We are holding off on any repeat imaging for now.  Consider if he has a clinical change

## 2023-09-20 ENCOUNTER — Other Ambulatory Visit: Payer: Self-pay | Admitting: Emergency Medicine

## 2023-09-20 LAB — C. TRACHOMATIS/N. GONORRHOEAE RNA
C. trachomatis RNA, TMA: NOT DETECTED
N. gonorrhoeae RNA, TMA: NOT DETECTED

## 2023-09-21 ENCOUNTER — Ambulatory Visit

## 2023-09-21 VITALS — BP 126/86 | HR 74 | Ht 70.0 in | Wt 175.6 lb

## 2023-09-21 DIAGNOSIS — I10 Essential (primary) hypertension: Secondary | ICD-10-CM | POA: Diagnosis not present

## 2023-09-21 NOTE — Patient Instructions (Signed)
 It was great to see you today! Thank you for choosing [facility] for your primary care.  Today we addressed: Consider additional medications   Blood Pressure Record Sheet To take your blood pressure, you will need a blood pressure machine. You can buy a blood pressure machine (blood pressure monitor) at your clinic, drug store, or online. When choosing one, consider: An automatic monitor that has an arm cuff. A cuff that wraps snugly around your upper arm. You should be able to fit only one finger between your arm and the cuff. A device that stores blood pressure reading results. Do not choose a monitor that measures your blood pressure from your wrist or finger. Follow your health care provider's instructions for how to take your blood pressure. To use this form: Take your blood pressure medications every day These measurements should be taken when you have been at rest for at least 10-15 min Take at least 2 readings with each blood pressure check. This makes sure the results are correct. Wait 1-2 minutes between measurements. Write down the results in the spaces on this form. Keep in mind it should always be recorded systolic over diastolic. Both numbers are important.  Repeat this every day for 2-3 weeks, or as told by your health care provider.  Make a follow-up appointment with your health care provider to discuss the results.  Blood Pressure Log Date Medications taken? (Y/N) Blood Pressure Time of Day                                                                                                       Call if consistently elevated >140/90 on more than one occasion   If you haven't already, sign up for My Chart to have easy access to your labs results, and communication with your primary care physician.   Please arrive 15 minutes before your appointment to ensure smooth check in process.  We appreciate your efforts in making this happen.  Thank you for  allowing me to participate in your care, Ernestina Headland, MD 09/21/2023, 9:38 AM PGY-3, [facility]

## 2023-09-21 NOTE — Progress Notes (Signed)
    SUBJECTIVE:   CHIEF COMPLAINT / HPI:   BP Follow Up: - meds: Losartan 50 mg  - normal today - BP elevated x2 at pulmonologist SBP 140s-160s - He experiences elevated blood pressure in the afternoon, with morning readings well-controlled.  - He takes losartan 50 mg every morning.  - Recent afternoon readings are 160/90 mmHg and 155/80 mmHg.  - He denies headaches, chest pain, or leg swelling.  He has COPD and uses an inhaler in the morning, with a rescue inhaler available during the day. He experiences shortness of breath, attributed to COPD.  PERTINENT  PMH / PSH:  CAD Diastolic CHF Carotid artery stenosis  COPD Dysphagia  GERD Colitis HIV - on Triumeq  HLD CKD   OBJECTIVE:   BP 126/86   Pulse 74   Ht 5\' 10"  (1.778 m)   Wt 175 lb 9.6 oz (79.7 kg)   SpO2 98%   BMI 25.20 kg/m   General: Alert and oriented in no apparent distress Heart: Regular rate and rhythm with no murmurs appreciated Lungs: normal WOB Abdomen: no abdominal pain Skin: Warm and dry    ASSESSMENT/PLAN:   Assessment & Plan Primary hypertension Well controlled today Elevated afternoon readings despite losartan 50 mg. Possible inadequate full-day coverage. Ambulatory monitoring scheduled. Discussed amlodipine  addition, but he prefers to wait. - Provide blood pressure log for home monitoring. - Instruct to document readings, noting if >140/90 on consecutive days, and call if this occurs. - Schedule ambulatory blood pressure monitoring on June 3rd with Dr. Koval. - Discuss option to add amlodipine  5 mg if blood pressure remains high, but he prefers to wait for more information.     Ernestina Headland, MD [facility]

## 2023-09-23 LAB — COMPREHENSIVE METABOLIC PANEL WITH GFR
AG Ratio: 1.9 (calc) (ref 1.0–2.5)
ALT: 13 U/L (ref 9–46)
AST: 14 U/L (ref 10–35)
Albumin: 4.3 g/dL (ref 3.6–5.1)
Alkaline phosphatase (APISO): 71 U/L (ref 35–144)
BUN/Creatinine Ratio: 16 (calc) (ref 6–22)
BUN: 26 mg/dL — ABNORMAL HIGH (ref 7–25)
CO2: 25 mmol/L (ref 20–32)
Calcium: 9.3 mg/dL (ref 8.6–10.3)
Chloride: 107 mmol/L (ref 98–110)
Creat: 1.63 mg/dL — ABNORMAL HIGH (ref 0.70–1.28)
Globulin: 2.3 g/dL (ref 1.9–3.7)
Glucose, Bld: 122 mg/dL — ABNORMAL HIGH (ref 65–99)
Potassium: 4.1 mmol/L (ref 3.5–5.3)
Sodium: 140 mmol/L (ref 135–146)
Total Bilirubin: 0.4 mg/dL (ref 0.2–1.2)
Total Protein: 6.6 g/dL (ref 6.1–8.1)
eGFR: 44 mL/min/{1.73_m2} — ABNORMAL LOW (ref >=60)

## 2023-09-23 LAB — T-HELPER CELLS (CD4) COUNT (NOT AT ARMC)
Absolute CD4: 532 {cells}/uL (ref 490–1740)
CD4 T Helper %: 21 % — ABNORMAL LOW (ref 30–61)
Total lymphocyte count: 2579 {cells}/uL (ref 850–3900)

## 2023-09-23 LAB — LIPID PANEL
Cholesterol: 121 mg/dL (ref <200)
HDL: 45 mg/dL (ref >=40)
LDL Cholesterol (Calc): 54 mg/dL
Non-HDL Cholesterol (Calc): 76 mg/dL (ref <130)
Total CHOL/HDL Ratio: 2.7 (calc) (ref <5.0)
Triglycerides: 132 mg/dL (ref <150)

## 2023-09-23 LAB — HIV RNA, RTPCR W/R GT (RTI, PI,INT)
HIV 1 RNA Quant: NOT DETECTED {copies}/mL
HIV-1 RNA Quant, Log: NOT DETECTED {Log_copies}/mL

## 2023-09-23 LAB — RPR: RPR Ser Ql: NONREACTIVE

## 2023-09-23 NOTE — Progress Notes (Signed)
 Cardiology Office Note:    Date:  09/24/2023  ID:  Jeffrey [name], DOB 1945-07-28, MRN [id number] PCP: Genora Kidd, MD  [facility] Providers Cardiologist:  Janelle Mediate, MD       Patient Profile:      Coronary artery Ca2+ CCTA 03/21/2019: CAC score 122 (45th percentile), normal aortic root 3.5 cm, nonobstructive CAD (proximal LAD 25-49, ostial circumflex 25-49) Diastolic dysfunction  TTE 07/24/2019: EF 60-65, no RWMA, GR 1 DD, GLS -13.1, normal RVSF, normal PASP, trivial MR, ascending aorta 37 mm (mild dilation), RAP 3 Hypertension  Hyperlipidemia  Chronic kidney disease  Neph: Dr. Zelda Hickman HIV  Hx of TIA  Cerebrovascular disease S/p intervention to supraclinoid segment R ICA in 2021 (Dr. Alvira Josephs) Hx of COVID w interstitial lung changes, chronic shortness of breath  Chronic Obstructive Pulmonary Disease  Pulm: Dr. Baldwin Levee  Bronchiectasis, bronchomalacia  Hx of Shigella           Discussed the use of AI scribe software for clinical note transcription with the patient, who gave verbal consent to proceed.  History of Present Illness Jeffrey [name] is a 78 y.o. male who returns for follow-up of BP.  He was last seen by Dr. Stann Earnest in June 2024.  He is here alone. His blood pressure has been fluctuating over the past couple of months, with some days being significantly elevated. He is currently taking losartan 50 mg daily. He has not made any adjustments to his medication regimen. He acknowledges using more salt than recommended and suspects this might contribute to his blood pressure fluctuations. He has COPD and reports worsening shortness of breath over the past year, requiring him to stop and rest or use his inhaler after walking short distances. He has not had chest symptoms, heaviness, or pressure, but his breathing has gradually worsened. He coughs frequently, which he attributes to COPD. He experiences leg pain from the ankles upwards, particularly in the calves,  which sometimes causes him to stop walking. He has not been on long walks recently. He also reports occasional numbness in his leg. He has experienced significant family stress, with the recent passing of several family members, including three sisters, a niece, a nephew, and a brother, which he acknowledges might be affecting his health. He denies smoking and reports minimal alcohol consumption. He is retired.    ROS-See HPI       Studies Reviewed:        Results LABS LDL: 54 (09/2023)    Risk Assessment/Calculations:     HYPERTENSION CONTROL Vitals:   09/24/23 1109 09/24/23 1212 09/24/23 1213  BP: 128/82 120/80 (!) 140/80    The patient's blood pressure is elevated above target today.  In order to address the patient's elevated BP: Blood pressure will be monitored at home to determine if medication changes need to be made.          Physical Exam:   VS:  BP (!) 140/80 (BP Location: Left Arm)   Pulse 86   Ht 5\' 10"  (1.778 m)   Wt 171 lb (77.6 kg)   SpO2 95%   BMI 24.54 kg/m    Wt Readings from Last 3 Encounters:  09/24/23 171 lb (77.6 kg)  09/21/23 175 lb 9.6 oz (79.7 kg)  09/19/23 177 lb 3.2 oz (80.4 kg)    Constitutional:      Appearance: Healthy appearance. Not in distress.  Neck:     Vascular: JVD normal.  Pulmonary:  Breath sounds: Normal breath sounds. No wheezing. No rales.  Cardiovascular:     Normal rate. Regular rhythm.     Murmurs: There is no murmur.     Comments: Popliteal pulses 2+ bilat; DP/PT difficult to palpate Edema:    Peripheral edema absent.  Abdominal:     Palpations: Abdomen is soft.        Assessment and Plan:  Assessment and Plan Assessment & Plan Hypertension He has recently noted fluctuating blood pressure readings.  He does admit to dietary indiscretion with sodium which is likely impacting his blood pressure control.  He also notes significant emotional stress in the last several months.  On exam today, he has blood pressure  discrepancy between the right arm and left arm.  By my measurement, on the right his blood pressure was 120/80.  On the left, 140/80.  He does note some weakness in his right arm.  Question if he has right subclavian stenosis. - Continue blood pressure monitoring at home.  I have asked him to monitor his blood pressure on the left only for now.  He will bring readings to his next appointment for review. - Continue losartan 50 mg daily - Schedule carotid ultrasound to evaluate for subclavian stenosis. - Provide low sodium dietary guidance. - Consider adding amlodipine  if BP above goal (goal < 130/80)  Cerebrovascular disease  S/p angioplasty of the supraclinoid segment of the right internal carotid artery in 2021.  He remains on aspirin  and clopidogrel  for secondary prevention.  He is followed by interventional radiology.  Coronary artery disease Calcium  score 122 by CT in November 2020.  He had nonobstructive CAD noted.  He has not having symptoms to suggest angina.  As noted, he remains on dual antiplatelet therapy for cerebrovascular disease.  LDL in May 2025 was optimal at 54.  Continue Crestor  20 mg daily.  Claudication He notes bilateral calf pain. Popliteal pulses are good. I will obtain ABIs to screen for peripheral arterial disease. - Arrange ABIs        Dispo:  Return in about 4 weeks (around 10/22/2023) for Routine Follow Up, w/ Marlyse Single, PA-C.  Signed, Marlyse Single, PA-C

## 2023-09-24 ENCOUNTER — Ambulatory Visit: Attending: Physician Assistant | Admitting: Physician Assistant

## 2023-09-24 ENCOUNTER — Ambulatory Visit: Payer: Self-pay | Admitting: Infectious Diseases

## 2023-09-24 ENCOUNTER — Encounter: Payer: Self-pay | Admitting: Physician Assistant

## 2023-09-24 VITALS — BP 140/80 | HR 86 | Ht 70.0 in | Wt 171.0 lb

## 2023-09-24 DIAGNOSIS — I739 Peripheral vascular disease, unspecified: Secondary | ICD-10-CM | POA: Diagnosis not present

## 2023-09-24 DIAGNOSIS — I10 Essential (primary) hypertension: Secondary | ICD-10-CM

## 2023-09-24 DIAGNOSIS — I998 Other disorder of circulatory system: Secondary | ICD-10-CM | POA: Diagnosis not present

## 2023-09-24 DIAGNOSIS — I2510 Atherosclerotic heart disease of native coronary artery without angina pectoris: Secondary | ICD-10-CM | POA: Diagnosis not present

## 2023-09-24 NOTE — Patient Instructions (Addendum)
 Medication Instructions:  Your physician recommends that you continue on your current medications as directed. Please refer to the Current Medication list given to you today.  *If you need a refill on your cardiac medications before your next appointment, please call your pharmacy*  Lab Work: None ordered  If you have labs (blood work) drawn today and your tests are completely normal, you will receive your results only by: MyChart Message (if you have MyChart) OR A paper copy in the mail If you have any lab test that is abnormal or we need to change your treatment, we will call you to review the results.  Testing/Procedures: Your physician has requested that you have a carotid duplex. This test is an ultrasound of the carotid arteries in your neck. It looks at blood flow through these arteries that supply the brain with blood. Allow one hour for this exam. There are no restrictions or special instructions.  Your physician recommends you have Vascular US  ABI study done  Follow-Up: At [facility], you and your health needs are our priority.  As part of our continuing mission to provide you with exceptional heart care, our providers are all part of one team.  This team includes your primary Cardiologist (physician) and Advanced Practice Providers or APPs (Physician Assistants and Nurse Practitioners) who all work together to provide you with the care you need, when you need it.  Your next appointment:   3-4  week(s)   Provider:   Marlyse Single, PA-C          We recommend signing up for the patient portal called "MyChart".  Sign up information is provided on this After Visit Summary.  MyChart is used to connect with patients for Virtual Visits (Telemedicine).  Patients are able to view lab/test results, encounter notes, upcoming appointments, etc.  Non-urgent messages can be sent to your provider as well.   To learn more about what you can do with MyChart, go to  [email or web address].   Other Instructions Monitor your blood pressure on the LEFT SIDE.  Keep a  log and bring them with you to your next appointment.  Low-Sodium Eating Plan Salt (sodium) helps you keep a healthy balance of fluids in your body. Too much sodium can raise your blood pressure. It can also cause fluid and waste to be held in your body. Your health care provider or dietitian may recommend a low-sodium eating plan if you have high blood pressure (hypertension), kidney disease, liver disease, or heart failure. Eating less sodium can help lower your blood pressure and reduce swelling. It can also protect your heart, liver, and kidneys. What are tips for following this plan? Reading food labels  Check food labels for the amount of sodium per serving. If you eat more than one serving, you must multiply the listed amount by the number of servings. Choose foods with less than 140 milligrams (mg) of sodium per serving. Avoid foods with 300 mg of sodium or more per serving. Always check how much sodium is in a product, even if the label says "unsalted" or "no salt added." Shopping  Buy products labeled as "low-sodium" or "no salt added." Buy fresh foods. Avoid canned foods and pre-made or frozen meals. Avoid canned, cured, or processed meats. Buy breads that have less than 80 mg of sodium per slice. Cooking  Eat more home-cooked food. Try to eat less restaurant, buffet, and fast food. Try not to add salt when you cook. Use salt-free seasonings or herbs  instead of table salt or sea salt. Check with your provider or pharmacist before using salt substitutes. Cook with plant-based oils, such as canola, sunflower, or olive oil. Meal planning When eating at a restaurant, ask if your food can be made with less salt or no salt. Avoid dishes labeled as brined, pickled, cured, or smoked. Avoid dishes made with soy sauce, miso, or teriyaki sauce. Avoid foods that have monosodium glutamate  (MSG) in them. MSG may be added to some restaurant food, sauces, soups, bouillon, and canned foods. Make meals that can be grilled, baked, poached, roasted, or steamed. These are often made with less sodium. General information Try to limit your sodium intake to 1,500-2,300 mg each day, or the amount told by your provider. What foods should I eat? Fruits Fresh, frozen, or canned fruit. Fruit juice. Vegetables Fresh or frozen vegetables. "No salt added" canned vegetables. "No salt added" tomato sauce and paste. Low-sodium or reduced-sodium tomato and vegetable juice. Grains Low-sodium cereals, such as oats, puffed wheat and rice, and shredded wheat. Low-sodium crackers. Unsalted rice. Unsalted pasta. Low-sodium bread. Whole grain breads and whole grain pasta. Meats and other proteins Fresh or frozen meat, poultry, seafood, and fish. These should have no added salt. Low-sodium canned tuna and salmon. Unsalted nuts. Dried peas, beans, and lentils without added salt. Unsalted canned beans. Eggs. Unsalted nut butters. Dairy Milk. Soy milk. Cheese that is naturally low in sodium, such as ricotta cheese, fresh mozzarella, or Swiss cheese. Low-sodium or reduced-sodium cheese. Cream cheese. Yogurt. Seasonings and condiments Fresh and dried herbs and spices. Salt-free seasonings. Low-sodium mustard and ketchup. Sodium-free salad dressing. Sodium-free light mayonnaise. Fresh or refrigerated horseradish. Lemon juice. Vinegar. Other foods Homemade, reduced-sodium, or low-sodium soups. Unsalted popcorn and pretzels. Low-salt or salt-free chips. The items listed above may not be all the foods and drinks you can have. Talk to a dietitian to learn more. What foods should I avoid? Vegetables Sauerkraut, pickled vegetables, and relishes. Olives. [place] fries. Onion rings. Regular canned vegetables, except low-sodium or reduced-sodium items. Regular canned tomato sauce and paste. Regular tomato and vegetable  juice. Frozen vegetables in sauces. Grains Instant hot cereals. Bread stuffing, pancake, and biscuit mixes. Croutons. Seasoned rice or pasta mixes. Noodle soup cups. Boxed or frozen macaroni and cheese. Regular salted crackers. Self-rising flour. Meats and other proteins Meat or fish that is salted, canned, smoked, spiced, or pickled. Precooked or cured meat, such as sausages or meat loaves. Helene Loader. Ham. Pepperoni. Hot dogs. Corned beef. Chipped beef. Salt pork. Jerky. Pickled herring, anchovies, and sardines. Regular canned tuna. Salted nuts. Dairy Processed cheese and cheese spreads. Hard cheeses. Cheese curds. Blue cheese. Feta cheese. String cheese. Regular cottage cheese. Buttermilk. Canned milk. Fats and oils Salted butter. Regular margarine. Ghee. Bacon fat. Seasonings and condiments Onion salt, garlic salt, seasoned salt, table salt, and sea salt. Canned and packaged gravies. Worcestershire sauce. Tartar sauce. Barbecue sauce. Teriyaki sauce. Soy sauce, including reduced-sodium soy sauce. Steak sauce. Fish sauce. Oyster sauce. Cocktail sauce. Horseradish that you find on the shelf. Regular ketchup and mustard. Meat flavorings and tenderizers. Bouillon cubes. Hot sauce. Pre-made or packaged marinades. Pre-made or packaged taco seasonings. Relishes. Regular salad dressings. Salsa. Other foods Salted popcorn and pretzels. Corn chips and puffs. Potato and tortilla chips. Canned or dried soups. Pizza. Frozen entrees and pot pies. The items listed above may not be all the foods and drinks you should avoid. Talk to a dietitian to learn more. This information is not intended to  replace advice given to you by your health care provider. Make sure you discuss any questions you have with your health care provider. Document Revised: 05/11/2022 Document Reviewed: 05/11/2022 Elsevier Patient Education  2024 [employer].

## 2023-10-03 ENCOUNTER — Encounter: Payer: Self-pay | Admitting: Infectious Diseases

## 2023-10-03 ENCOUNTER — Ambulatory Visit: Payer: Medicare HMO | Admitting: Infectious Diseases

## 2023-10-03 ENCOUNTER — Other Ambulatory Visit: Payer: Self-pay

## 2023-10-03 VITALS — BP 132/85 | HR 75 | Resp 16 | Ht 70.0 in | Wt 176.3 lb

## 2023-10-03 DIAGNOSIS — B20 Human immunodeficiency virus [HIV] disease: Secondary | ICD-10-CM | POA: Diagnosis not present

## 2023-10-03 DIAGNOSIS — Z23 Encounter for immunization: Secondary | ICD-10-CM | POA: Diagnosis not present

## 2023-10-03 DIAGNOSIS — Z113 Encounter for screening for infections with a predominantly sexual mode of transmission: Secondary | ICD-10-CM

## 2023-10-03 DIAGNOSIS — Z79899 Other long term (current) drug therapy: Secondary | ICD-10-CM

## 2023-10-03 DIAGNOSIS — Z0000 Encounter for general adult medical examination without abnormal findings: Secondary | ICD-10-CM

## 2023-10-03 NOTE — Progress Notes (Unsigned)
 [address], [place], [place], [address]                                                                  Phn. [phone]; Fax: [phone]                                                                             Date: 10/03/23 Reason for Visit: Routine HIV care.  HPI: Jeffrey [name] is a 78 y.o.old male with a history of HIV ( previously on Truvada and [name] > stribild since  March 2014 for simplification of ART regimen>switched to Triumeq  in 09/02/2013 due  to increasing Cr which he has been on until now), Syphilis s/p treatment, CAD, CKD, COPD, GERD, HTN, CVA/TIA s/p RT sided weakness, COVID 19  who is here for regular HIV follow up. Patient has been previously followed by Dr Daina Drum for a long time.   Compliant with Triumeq , no concerns. Reports some fluctuations in his BP and Cardiology/PCP is adjusting his medications. He is supposed to get carotid US  and ABI. Last seen at Cardiology office on 5/19 and PCP on 5/16. Seen by Pulmonary on 5/14 for COPD and Neurology on 04/27/23 for headache. Denies smoking, alcohol and recreational drug use. He has dentures, and willing to get PCV 20. No other complaints.  ROS: As stated in above HPI; all other systems were reviewed and are otherwise negative unless noted below  No reported fever / chills, night sweats, unintentional weight loss, acute visual change, odynophagia, chest pain/pressure, new or worsened SOB or WOB, nausea, vomiting, diarrhea, dysuria, GU discharge, syncope, seizures, red/hot swollen joints, hallucinations / delusions, rashes, new allergies, unusual / excessive bleeding, swollen lymph nodes, or new hospitalizations/ED visits/Urgent Care visits since the pt was last seen.  PMH/ PSH/ FamHx / Social Hx , medications and allergies  reviewed and updated as appropriate; please see corresponding tab in EHR / prior notes                                        Current Outpatient Medications on File Prior to Visit  Medication Sig Dispense Refill   abacavir -dolutegravir -lamiVUDine  (TRIUMEQ ) 600-50-300 MG tablet Take 1 tablet by mouth daily. 30 tablet 11   albuterol  (VENTOLIN  HFA) 108 (90 Base) MCG/ACT inhaler Inhale 2 puffs into the lungs every 6 (six) hours as needed for wheezing or shortness of breath. 9 g 6   Ascorbic Acid (VITAMIN C) 1000 MG tablet Take 1,000 mg by mouth daily at 6 PM.     aspirin   EC 81 MG tablet Take 1 tablet (81 mg total) by mouth daily.     Cholecalciferol (VITAMIN D3) 125 MCG (5000 UT) TABS Take 5,000 Units by mouth daily at 6 PM.     clopidogrel  (PLAVIX ) 75 MG tablet Take 1 tablet by mouth once daily 90 tablet 0   finasteride  (PROSCAR ) 5 MG tablet Take 5 mg by mouth in the morning and at bedtime.     losartan (COZAAR) 50 MG tablet Take 50 mg by mouth daily. Started by his kidney doctor- Dr. Zelda Hickman - [facility]     pantoprazole  (PROTONIX ) 20 MG tablet Take 1 tablet by mouth twice daily 180 tablet 2   rosuvastatin  (CRESTOR ) 20 MG tablet Take 1 tablet by mouth once daily 90 tablet 3   sildenafil (REVATIO) 20 MG tablet Take 20 mg by mouth as needed.     solifenacin (VESICARE) 10 MG tablet Take by mouth.     STIOLTO RESPIMAT  2.5-2.5 MCG/ACT AERS INHALE 2 PUFFS BY MOUTH ONCE DAILY 4 g 0   sucralfate  (CARAFATE ) 1 GM/10ML suspension Take 10 mLs (1 g total) by mouth every 6 (six) hours as needed. 420 mL 1   vitamin B-12 (CYANOCOBALAMIN) 500 MCG tablet Take 500 mcg by mouth daily.     Zinc 50 MG TABS Take 50 mg by mouth daily at 6 PM.     No current facility-administered medications on file prior to visit.   Allergies  Allergen Reactions   Baclofen  Other (See Comments)    Back pain. Patient said it made him feel crazy    Efavirenz  Rash   Nevirapine Rash and Other (See Comments)    Past Medical  History:  Diagnosis Date   CAD (coronary artery disease)    Chronic kidney disease    Colon polyps    COPD (chronic obstructive pulmonary disease) (HCC)    COVID-19    05/2019   GERD (gastroesophageal reflux disease)    Heart murmur    History of kidney stones    HIV (human immunodeficiency virus infection) (HCC) 1999   Hypertension    Pneumonia    Stroke (HCC)    right side weakness   TIA (transient ischemic attack)    Past Surgical History:  Procedure Laterality Date   CATARACT EXTRACTION W/ INTRAOCULAR LENS IMPLANT Bilateral    CHOLECYSTECTOMY  1982   COLONOSCOPY     ESOPHAGEAL MANOMETRY N/A 09/03/2017   Procedure: ESOPHAGEAL MANOMETRY (EM);  Surgeon: Sergio Dandy, MD;  Location: [facility];  Service: Endoscopy;  Laterality: N/A;   IR ANGIO INTRA EXTRACRAN SEL COM CAROTID INNOMINATE BILAT MOD SED  04/21/2019   IR ANGIO INTRA EXTRACRAN SEL COM CAROTID INNOMINATE BILAT MOD SED  05/11/2020   IR ANGIO VERTEBRAL SEL VERTEBRAL BILAT MOD SED  04/21/2019   IR ANGIO VERTEBRAL SEL VERTEBRAL BILAT MOD SED  05/11/2020   IR PTA INTRACRANIAL  05/14/2019   IR US  GUIDE VASC ACCESS RIGHT  05/11/2020   KNEE SURGERY     LEFT KNEE   RADIOLOGY WITH ANESTHESIA N/A 05/14/2019   Procedure: IR WITH ANESTHESIA STENTING;  Surgeon: Luellen Sages, MD;  Location: [facility] OR;  Service: Radiology;  Laterality: N/A;   TONSILLECTOMY     Social History   Socioeconomic History   Marital status: Divorced    Spouse name: Not on file   Number of children: 2   Years of education: 14   Highest education level: Some college, no degree  Occupational History   Occupation: retired  Employer: [employer]  Tobacco Use   Smoking status: Never   Smokeless tobacco: Former    Types: Chew    Quit date: 2019  Vaping Use   Vaping status: Never Used  Substance and Sexual Activity   Alcohol use: Yes    Comment: rare - few per year   Drug use: No   Sexual activity: Not Currently    Partners: Female     Comment: declined condoms  Other Topics Concern   Not on file  Social History Narrative   Lives alone   Right handed    Caffeine use: tea, mainly water      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, English as a foreign language   2 children - 1 boy and 1 girl   Social Drivers of Corporate investment banker Strain: Low Risk  (07/30/2023)   Overall Financial Resource Strain (CARDIA)    Difficulty of Paying Living Expenses: Not hard at all  Food Insecurity: No Food Insecurity (07/30/2023)   Hunger Vital Sign    Worried About Running Out of Food in the Last Year: Never true    Ran Out of Food in the Last Year: Never true  Transportation Needs: No Transportation Needs (07/30/2023)   PRAPARE - Administrator, Civil Service (Medical): No    Lack of Transportation (Non-Medical): No  Physical Activity: Sufficiently Active (07/30/2023)   Exercise Vital Sign    Days of Exercise per Week: 7 days    Minutes of Exercise per Session: 60 min  Stress: No Stress Concern Present (07/30/2023)   [employer] of Occupational Health - Occupational Stress Questionnaire    Feeling of Stress : Not at all  Social Connections: Socially Isolated (07/30/2023)   Social Connection and Isolation Panel [NHANES]    Frequency of Communication with Friends and Family: More than three times a week    Frequency of Social Gatherings with Friends and Family: Once a week    Attends Religious Services: Never    Database administrator or Organizations: Patient declined    Attends Banker Meetings: Never    Marital Status: Divorced  Catering manager Violence: Not At Risk (07/30/2023)   Humiliation, Afraid, Rape, and Kick questionnaire    Fear of Current or Ex-Partner: No    Emotionally Abused: No    Physically Abused: No    Sexually Abused: No   Family History  Problem Relation Age of Onset   Hypertension Mother    Heart attack Mother 60   Alzheimer's disease Sister    Heart disease Sister    Alzheimer's disease Sister     Drug abuse Sister    COPD Brother    Kidney cancer Brother    Colon cancer Neg Hx    Esophageal cancer Neg Hx    Pancreatic cancer Neg Hx    Stomach cancer Neg Hx    Liver disease Neg Hx    Vitals  BP 132/85   Pulse 75   Resp 16   Ht 5\' 10"  (1.778 m)   Wt 176 lb 4.8 oz (80 kg)   SpO2 99%   BMI 25.30 kg/m    Examination  Gen: no acute distress HEENT: /AT, no scleral icterus, no pale conjunctivae, hearing normal, oral mucosa moist Neck: Supple Cardio: Regular rate and rhythm Resp: Pulmonary effort normal in room air GI: nondistended GU: Musc: Extremities: No pedal edema Skin: No rashes Neuro: grossly non focal , awake, alert and oriented * 3  Psych: Calm, cooperative  Lab Results HIV 1 RNA Quant  Date Value  09/19/2023 NOT DETECTED copies/mL  03/20/2023 Not Detected Copies/mL  02/20/2022 Not Detected Copies/mL   CD4 T Cell Abs (/uL)  Date Value  03/20/2023 501  02/20/2022 499  02/17/2021 396 (L)   No results found for: "HIV1GENOSEQ" Lab Results  Component Value Date   WBC 5.7 03/20/2023   HGB 13.7 03/20/2023   HCT 41.3 03/20/2023   MCV 101.7 (H) 03/20/2023   PLT 161 03/20/2023    Lab Results  Component Value Date   CREATININE 1.63 (H) 09/19/2023   BUN 26 (H) 09/19/2023   NA 140 09/19/2023   K 4.1 09/19/2023   CL 107 09/19/2023   CO2 25 09/19/2023   Lab Results  Component Value Date   ALT 13 09/19/2023   AST 14 09/19/2023   ALKPHOS 63 09/06/2020   BILITOT 0.4 09/19/2023    Lab Results  Component Value Date   CHOL 121 09/19/2023   TRIG 132 09/19/2023   HDL 45 09/19/2023   LDLCALC 54 09/19/2023   No results found for: "HAV" Lab Results  Component Value Date   HEPBSAG No 07/02/2006   HEPBSAB No 07/02/2006   Lab Results  Component Value Date   HCVAB No 07/02/2006   Lab Results  Component Value Date   CHLAMYDIAWP Negative 03/20/2023   N Negative 03/20/2023   No results found for: "GCPROBEAPT" No results found for:  "QUANTGOLD"  Pathology of colonoscopy 10/24/22 Diagnosis 1. Surgical [P], colon, transverse, polyp (1) - TUBULAR ADENOMA - NEGATIVE FOR HIGH-GRADE DYSPLASIA OR MALIGNANCY 2. Surgical [P], colon, recto-sigmoid erosions - FOCALLY ACTIVE COLITIS WITH EROSION/ULCERATION, FIBROSIS, REACTIVE/REGENERATIVE TYPE CHANGES, AND ASSOCIATED MILD ARCHITECTURAL DISTORTION - NEGATIVE FOR GRANULOMATA, DYSPLASIA OR MALIGNANCY - SEE NOTE Diagnosis Note 2. No definitive features of chronicity are identified. The histologic features are non-specific and can be seen in low grade ischemia, medication effect, self-limited infectious processes and inflammatory bowel disease. Clinical-pathologic correlation is recommended.  Health Maintenance: Immunization History  Administered Date(s) Administered   Fluad Quad(high Dose 65+) 03/07/2022   Fluad Trivalent(High Dose 65+) 04/03/2023   H1N1 09/22/2008   Hepatitis B 09/24/2001, 11/07/2001, 05/26/2002   Influenza Whole 02/20/2006, 03/21/2007, 03/31/2008, 03/08/2009, 01/26/2010, 12/28/2010   Influenza,inj,Quad PF,6+ Mos 04/10/2013, 01/27/2014, 01/21/2015, 01/20/2016, 01/23/2017, 02/07/2018, 01/21/2019, 01/31/2021   Influenza-Unspecified 03/13/2013   Moderna Sars-Covid-2 Vaccination 09/17/2019, 10/15/2019   PFIZER Comirnaty(Gray Top)Covid-19 Tri-Sucrose Vaccine 05/20/2021   Pfizer(Comirnaty)Fall Seasonal Vaccine 12 years and older 03/07/2022, 04/03/2023   Pneumococcal Conjugate-13 01/06/2015   Pneumococcal Polysaccharide-23 09/22/2008, 08/07/2013   Tdap 01/06/2015   Assessment/Plan: # HIV - Continue Triumeq . Reviewed his prior ART h/o ( on Truvada and [name]  until 10/30/2012 when switched to  [name] to help with triglyceridemia> [name] changed to Triumeq  in 09/02/2013 due to elevated Cr to avoid tenofovir  and cobicistat - labs from 5/12 discussed and undetectable VL - Fu in 6 months.  Future lab orders prior to OV. Will discuss with patient next visit regarding  switch to Dovato  to avoid Abacavir  with concerns for cardiovascular disease. Prior genotypes reviewed and d/w Pharm D Amanda, no concerns.   # Chronic conditions ( CAD, HTN, CKD, COPD, CVA - Fu with PCP and other specialists respectively  # STD Screening  - no acute concerns  - 5/14 urine GC and RPR negative  #Immunization  - PCV 20 today   #Health maintenance - Dental care has been discussed  - Colonoscopy done in 10/24/22, no need for further  colonoscopies.  Patient's labs were reviewed as well as his previous records. Patients questions were addressed and answered. Safe sex counseling done.   I spent 30 minutes involved in face-to-face and non-face-to-face activities for this patient on the day of the visit. Professional time spent includes the following activities: Preparing to see the patient (review of tests), Obtaining and reviewing separately obtained history (prior notes from PCP< cardiology, pulmonary and neurology), Performing a medically appropriate examination and evaluation , Ordering labs, vaccine, Documenting clinical information in the EMR, Independently interpreting results (not separately reported), Communicating results to the patient,  Counseling and educating the patient and Care coordination (not separately reported).   Of note, portions of this note may have been created with voice recognition software. While this note has been edited for accuracy, occasional wrong-word or 'sound-a-like' substitutions may have occurred due to the inherent limitations of voice recognition software.   Electronically signed by:  Terre Ferri, MD Infectious Disease Physician [facility] [address] [place], [place] [address] Phone: [phone]  Fax: [phone]

## 2023-10-05 ENCOUNTER — Telehealth: Payer: Self-pay | Admitting: Pharmacist

## 2023-10-05 DIAGNOSIS — Z23 Encounter for immunization: Secondary | ICD-10-CM | POA: Insufficient documentation

## 2023-10-05 NOTE — Telephone Encounter (Signed)
 Cumulative HIV Genotype Data  Genotype Dates: June 2014 and November 2007   RT Mutations E44D, T215CS, K103N, K43Q, W88C, V118IT, I135V, Q207E, L210KR, R211K, V276I, L283I, T286A, A288S, I293V, Q334E  PI Mutations  None   Integrase Mutations  S230N   Interpretation of Genotype Data per [employer] HIV Drug Resistance Database:  Nucleoside RTIs  Abacavir  - Susceptible Zidovudine - Potential low-level resistance  Emtricitabine  - Susceptible Lamivudine  - Susceptible Tenofovir  - Susceptible   Non-Nucleoside RTIs  Doravirine - Susceptible Efavirenz  - High-level resistance Etravirine - Susceptible Nevirapine - High-level resistance Rilpivirine - Susceptible   Protease Inhibitors  Atazanavir - Susceptible Darunavir - Susceptible Lopinavir  - Susceptible   Integrase Inhibitors  Bictegravir - Susceptible Cabotegravir - Susceptible Dolutegravir  - Susceptible Elvitegravir - Susceptible Raltegravir - Susceptible   Nicklas Barns, PharmD, CPP, BCIDP, AAHIVP Clinical Pharmacist Practitioner Infectious Diseases Clinical Pharmacist [facility]

## 2023-10-05 NOTE — Progress Notes (Signed)
 The ASCVD Risk score (Arnett DK, et al., 2019) failed to calculate for the following reasons:   Risk score cannot be calculated because patient has a medical history suggesting prior/existing ASCVD  Arlon Bergamo, BSN, RN

## 2023-10-09 ENCOUNTER — Ambulatory Visit: Admitting: Pharmacist

## 2023-10-19 ENCOUNTER — Ambulatory Visit (HOSPITAL_COMMUNITY)
Admission: RE | Admit: 2023-10-19 | Discharge: 2023-10-19 | Disposition: A | Discharge status: Home / Self Care | Source: Ambulatory Visit | Attending: Physician Assistant | Admitting: Physician Assistant

## 2023-10-19 DIAGNOSIS — I739 Peripheral vascular disease, unspecified: Secondary | ICD-10-CM

## 2023-10-19 DIAGNOSIS — I10 Essential (primary) hypertension: Secondary | ICD-10-CM | POA: Diagnosis not present

## 2023-10-19 DIAGNOSIS — I998 Other disorder of circulatory system: Secondary | ICD-10-CM | POA: Diagnosis not present

## 2023-10-21 LAB — VAS US ABI WITH/WO TBI
Left ABI: 0.99
Right ABI: 1.08

## 2023-10-21 NOTE — Progress Notes (Signed)
 "    OFFICE NOTE Date:  10/22/2023  ID:  Jeffrey [name], DOB [birth year], MRN [id number] PCP: Christia Budds, MD  [facility] Providers Cardiologist:  Maude Emmer, MD     Patient Profile:     Coronary artery disease  CCTA 03/21/2019: CAC score 122 (45th percentile), normal aortic root 3.5 cm, nonobstructive CAD (proximal LAD 25-49, ostial circumflex 25-49) Diastolic dysfunction  TTE 07/24/2019: EF 60-65, no RWMA, GR 1 DD, GLS -13.1, normal RVSF, normal PASP, trivial MR, ascending aorta 37 mm (mild dilation), RAP 3 Hypertension  BP discrepancy - Carotid US  10/19/23: No ICA stenosis. No subclavian stenosis Hyperlipidemia  Chronic kidney disease  Neph: Dr. Dennise HIV  Hx of TIA  Cerebrovascular disease S/p intervention to supraclinoid segment R ICA in 2021 (Dr. Dolphus) Leg pain ABIs 10/19/23: R 1.08, L 0.99 Hx of COVID w interstitial lung changes, chronic shortness of breath  Chronic Obstructive Pulmonary Disease  Pulm: Dr. Shelah  Bronchiectasis, bronchomalacia  Hx of Shigella         Discussed the use of AI scribe software for clinical note transcription with the patient, who gave verbal consent to proceed. History of Present Illness Jeffrey [name] is a 78 y.o. male  He was last seen 09/24/23 with noted fluctuations in BP. He admitted to diet rich in salt and recent social stressors. There was a discrepancy in BPs between arms. Carotid US  showed no evidence of subclavian stenosis. I continued his current meds, asked him to keep a BP log and limit his salt. He noted bilateral calf claudication. ABIs were obtained and were normal.   He is here alone. He brings in a log of his BPs. I reviewed them all and his BPs have been optimal since his last office visit. He has not had chest discomfort, pressure, heaviness, or tightness.  He experiences chronic shortness of breath, consistent with known COPD, and needs to rest after walking short distances. No new or different symptoms  related to his COPD are reported.   ROS-See HPI     Studies Reviewed:  EKG Interpretation Date/Time:  Monday October 22 2023 10:47:22 EDT Ventricular Rate:  66 PR Interval:  160 QRS Duration:  88 QT Interval:  384 QTC Calculation: 402 R Axis:   51  Text Interpretation: Normal sinus rhythm Normal ECG No significant change since last tracing dated 10/16/2022 Confirmed by Lelon Hamilton [id number]) on 10/22/2023 10:51:35 AM   Results LABS Total cholesterol: 121 (09/19/2023) HDL: 45 (09/19/2023) LDL: 54 (09/19/2023) Triglycerides: 132 (09/19/2023) ALT: 13 (09/19/2023)  Risk Assessment/Calculations:         Physical Exam:  VS:  BP 128/82   Pulse 66   Ht 5' 10 (1.778 m)   Wt 173 lb 9.6 oz (78.7 kg)   SpO2 95%   BMI 24.91 kg/m    Wt Readings from Last 3 Encounters:  10/22/23 173 lb 9.6 oz (78.7 kg)  10/03/23 176 lb 4.8 oz (80 kg)  09/24/23 171 lb (77.6 kg)    Constitutional:      Appearance: Healthy appearance. Not in distress.  Neck:     Vascular: JVD normal.  Pulmonary:     Breath sounds: Normal breath sounds. No wheezing. No rales.  Cardiovascular:     Normal rate. Regular rhythm.     Murmurs: There is no murmur.  Edema:    Peripheral edema absent.  Abdominal:     Palpations: Abdomen is soft.        Assessment and  Plan: Assessment & Plan Essential hypertension Blood pressure optimal.  Blood pressure log from home was reviewed.  He has optimal readings at home as well.  He remains on losartan.  No change in management. Coronary artery disease involving native coronary artery of native heart without angina pectoris CCTA in November 2020 with mild nonobstructive CAD.  He is not having chest symptoms to suggest angina.  He remains on aspirin  and rosuvastatin .  LDL was optimal in May 2025. Claudication [facility]) Recent ABIs normal.  No further testing. Asymmetric blood pressures No evidence of subclavian stenosis on carotid US .      Dispo:  Return in about 1 year (around  10/21/2024) for Routine Follow Up, w/ Dr. Delford.  Signed, Glendia Ferrier, PA-C   "

## 2023-10-22 ENCOUNTER — Ambulatory Visit: Attending: Physician Assistant | Admitting: Physician Assistant

## 2023-10-22 ENCOUNTER — Ambulatory Visit: Payer: Self-pay | Admitting: Physician Assistant

## 2023-10-22 ENCOUNTER — Encounter: Payer: Self-pay | Admitting: Physician Assistant

## 2023-10-22 VITALS — BP 128/82 | HR 66 | Ht 70.0 in | Wt 173.6 lb

## 2023-10-22 DIAGNOSIS — I2510 Atherosclerotic heart disease of native coronary artery without angina pectoris: Secondary | ICD-10-CM

## 2023-10-22 DIAGNOSIS — I10 Essential (primary) hypertension: Secondary | ICD-10-CM | POA: Diagnosis not present

## 2023-10-22 DIAGNOSIS — I998 Other disorder of circulatory system: Secondary | ICD-10-CM

## 2023-10-22 DIAGNOSIS — I739 Peripheral vascular disease, unspecified: Secondary | ICD-10-CM

## 2023-10-22 NOTE — Patient Instructions (Signed)
 Medication Instructions:  Your physician recommends that you continue on your current medications as directed. Please refer to the Current Medication list given to you today.  *If you need a refill on your cardiac medications before your next appointment, please call your pharmacy*  Lab Work: None ordered.  If you have labs (blood work) drawn today and your tests are completely normal, you will receive your results only by: MyChart Message (if you have MyChart) OR A paper copy in the mail If you have any lab test that is abnormal or we need to change your treatment, we will call you to review the results.  Testing/Procedures: None ordered.   Follow-Up: At [facility], you and your health needs are our priority.  As part of our continuing mission to provide you with exceptional heart care, our providers are all part of one team.  This team includes your primary Cardiologist (physician) and Advanced Practice Providers or APPs (Physician Assistants and Nurse Practitioners) who all work together to provide you with the care you need, when you need it.  Your next appointment:   12 months with Dr Nishan

## 2023-10-22 NOTE — Assessment & Plan Note (Signed)
 CCTA in November 2020 with mild nonobstructive CAD.  He is not having chest symptoms to suggest angina.  He remains on aspirin  and rosuvastatin .  LDL was optimal in May 2025.

## 2023-10-22 NOTE — Assessment & Plan Note (Signed)
 Blood pressure optimal.  Blood pressure log from home was reviewed.  He has optimal readings at home as well.  He remains on losartan.  No change in management.

## 2023-11-03 ENCOUNTER — Encounter (HOSPITAL_COMMUNITY): Payer: Self-pay | Admitting: Interventional Radiology

## 2023-11-05 DIAGNOSIS — N401 Enlarged prostate with lower urinary tract symptoms: Secondary | ICD-10-CM | POA: Diagnosis not present

## 2023-11-12 DIAGNOSIS — N5201 Erectile dysfunction due to arterial insufficiency: Secondary | ICD-10-CM | POA: Diagnosis not present

## 2023-11-12 DIAGNOSIS — N401 Enlarged prostate with lower urinary tract symptoms: Secondary | ICD-10-CM | POA: Diagnosis not present

## 2023-11-12 DIAGNOSIS — R351 Nocturia: Secondary | ICD-10-CM | POA: Diagnosis not present

## 2023-11-18 ENCOUNTER — Other Ambulatory Visit: Payer: Self-pay | Admitting: Cardiovascular Disease

## 2023-11-18 DIAGNOSIS — E785 Hyperlipidemia, unspecified: Secondary | ICD-10-CM

## 2023-11-26 ENCOUNTER — Other Ambulatory Visit: Payer: Self-pay | Admitting: Emergency Medicine

## 2023-12-28 ENCOUNTER — Encounter: Payer: Self-pay | Admitting: Gastroenterology

## 2024-01-03 ENCOUNTER — Other Ambulatory Visit: Payer: Self-pay | Admitting: Gastroenterology

## 2024-01-03 ENCOUNTER — Other Ambulatory Visit: Payer: Self-pay | Admitting: Emergency Medicine

## 2024-01-30 ENCOUNTER — Emergency Department (HOSPITAL_COMMUNITY)

## 2024-01-30 ENCOUNTER — Emergency Department (HOSPITAL_COMMUNITY)
Admission: EM | Admit: 2024-01-30 | Discharge: 2024-01-30 | Disposition: A | Discharge status: Home / Self Care | Attending: Emergency Medicine | Admitting: Emergency Medicine

## 2024-01-30 DIAGNOSIS — S40012A Contusion of left shoulder, initial encounter: Secondary | ICD-10-CM | POA: Insufficient documentation

## 2024-01-30 DIAGNOSIS — Z79899 Other long term (current) drug therapy: Secondary | ICD-10-CM | POA: Diagnosis not present

## 2024-01-30 DIAGNOSIS — S3993XA Unspecified injury of pelvis, initial encounter: Secondary | ICD-10-CM | POA: Diagnosis not present

## 2024-01-30 DIAGNOSIS — R0989 Other specified symptoms and signs involving the circulatory and respiratory systems: Secondary | ICD-10-CM | POA: Diagnosis not present

## 2024-01-30 DIAGNOSIS — Z7901 Long term (current) use of anticoagulants: Secondary | ICD-10-CM | POA: Diagnosis not present

## 2024-01-30 DIAGNOSIS — M25552 Pain in left hip: Secondary | ICD-10-CM | POA: Insufficient documentation

## 2024-01-30 DIAGNOSIS — S0101XA Laceration without foreign body of scalp, initial encounter: Secondary | ICD-10-CM | POA: Insufficient documentation

## 2024-01-30 DIAGNOSIS — R220 Localized swelling, mass and lump, head: Secondary | ICD-10-CM | POA: Diagnosis not present

## 2024-01-30 DIAGNOSIS — I2510 Atherosclerotic heart disease of native coronary artery without angina pectoris: Secondary | ICD-10-CM | POA: Diagnosis not present

## 2024-01-30 DIAGNOSIS — R000 Tachycardia, unspecified: Secondary | ICD-10-CM | POA: Insufficient documentation

## 2024-01-30 DIAGNOSIS — S0990XA Unspecified injury of head, initial encounter: Secondary | ICD-10-CM | POA: Diagnosis not present

## 2024-01-30 DIAGNOSIS — Z7982 Long term (current) use of aspirin: Secondary | ICD-10-CM | POA: Diagnosis not present

## 2024-01-30 DIAGNOSIS — Z043 Encounter for examination and observation following other accident: Secondary | ICD-10-CM | POA: Diagnosis not present

## 2024-01-30 DIAGNOSIS — Y92410 Unspecified street and highway as the place of occurrence of the external cause: Secondary | ICD-10-CM | POA: Diagnosis not present

## 2024-01-30 DIAGNOSIS — S199XXA Unspecified injury of neck, initial encounter: Secondary | ICD-10-CM | POA: Diagnosis not present

## 2024-01-30 DIAGNOSIS — I10 Essential (primary) hypertension: Secondary | ICD-10-CM | POA: Diagnosis not present

## 2024-01-30 DIAGNOSIS — R918 Other nonspecific abnormal finding of lung field: Secondary | ICD-10-CM | POA: Diagnosis not present

## 2024-01-30 LAB — COMPREHENSIVE METABOLIC PANEL WITH GFR
ALT: 16 U/L (ref 0–44)
AST: 16 U/L (ref 15–41)
Albumin: 3.7 g/dL (ref 3.5–5.0)
Alkaline Phosphatase: 75 U/L (ref 38–126)
Anion gap: 11 (ref 5–15)
BUN: 25 mg/dL — ABNORMAL HIGH (ref 8–23)
CO2: 21 mmol/L — ABNORMAL LOW (ref 22–32)
Calcium: 9.2 mg/dL (ref 8.9–10.3)
Chloride: 107 mmol/L (ref 98–111)
Creatinine, Ser: 1.82 mg/dL — ABNORMAL HIGH (ref 0.61–1.24)
GFR, Estimated: 38 mL/min — ABNORMAL LOW (ref >60)
Glucose, Bld: 144 mg/dL — ABNORMAL HIGH (ref 70–99)
Potassium: 3.7 mmol/L (ref 3.5–5.1)
Sodium: 139 mmol/L (ref 135–145)
Total Bilirubin: 0.8 mg/dL (ref 0.0–1.2)
Total Protein: 6.5 g/dL (ref 6.5–8.1)

## 2024-01-30 LAB — CBC
HCT: 37.9 % — ABNORMAL LOW (ref 39.0–52.0)
Hemoglobin: 12.6 g/dL — ABNORMAL LOW (ref 13.0–17.0)
MCH: 33.8 pg (ref 26.0–34.0)
MCHC: 33.2 g/dL (ref 30.0–36.0)
MCV: 101.6 fL — ABNORMAL HIGH (ref 80.0–100.0)
Platelets: 182 K/uL (ref 150–400)
RBC: 3.73 MIL/uL — ABNORMAL LOW (ref 4.22–5.81)
RDW: 13.2 % (ref 11.5–15.5)
WBC: 8.2 K/uL (ref 4.0–10.5)
nRBC: 0 % (ref 0.0–0.2)

## 2024-01-30 LAB — I-STAT CHEM 8, ED
BUN: 26 mg/dL — ABNORMAL HIGH (ref 8–23)
Calcium, Ion: 1.17 mmol/L (ref 1.15–1.40)
Chloride: 106 mmol/L (ref 98–111)
Creatinine, Ser: 2 mg/dL — ABNORMAL HIGH (ref 0.61–1.24)
Glucose, Bld: 145 mg/dL — ABNORMAL HIGH (ref 70–99)
HCT: 36 % — ABNORMAL LOW (ref 39.0–52.0)
Hemoglobin: 12.2 g/dL — ABNORMAL LOW (ref 13.0–17.0)
Potassium: 3.8 mmol/L (ref 3.5–5.1)
Sodium: 140 mmol/L (ref 135–145)
TCO2: 21 mmol/L — ABNORMAL LOW (ref 22–32)

## 2024-01-30 LAB — ABO/RH: ABO/RH(D): O POS

## 2024-01-30 LAB — ETHANOL: Alcohol, Ethyl (B): <15 mg/dL (ref <15)

## 2024-01-30 LAB — PROTIME-INR
INR: 0.9 (ref 0.8–1.2)
Prothrombin Time: 12.7 s (ref 11.4–15.2)

## 2024-01-30 LAB — I-STAT CG4 LACTIC ACID, ED: Lactic Acid, Venous: 1.6 mmol/L (ref 0.5–1.9)

## 2024-01-30 MED ORDER — SODIUM CHLORIDE 0.9% IV SOLUTION
Freq: Once | INTRAVENOUS | Status: DC
Start: 2024-01-30 — End: 2024-01-30

## 2024-01-30 MED ORDER — IOHEXOL 350 MG/ML SOLN
75.0000 mL | Freq: Once | INTRAVENOUS | Status: AC | PRN
Start: 2024-01-30 — End: 2024-01-30
  Administered 2024-01-30: 75 mL via INTRAVENOUS

## 2024-01-30 NOTE — Discharge Instructions (Addendum)
 Please use acetaminophen  and cold therapy for your shoulder contusion Please keep pressure dressing in place and head tonight. Follow-up with trauma for staple removal in 10 days Return to the emergency department immediately if you become lightheaded, weak, or have new significant pain. Your kidney function is increased.  It has been gradually increasing.  Please follow-up next week with your primary care doctor for recheck.
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
## 2024-01-30 NOTE — ED Notes (Signed)
 CCMD called to place the patient on cardiac monitoring services.
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
## 2024-01-30 NOTE — ED Notes (Signed)
 Pt ambulated to restroom without difficulty
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
## 2024-01-31 DIAGNOSIS — S0101XA Laceration without foreign body of scalp, initial encounter: Secondary | ICD-10-CM | POA: Diagnosis not present

## 2024-01-31 DIAGNOSIS — D62 Acute posthemorrhagic anemia: Secondary | ICD-10-CM | POA: Diagnosis not present

## 2024-01-31 LAB — BPAM RBC
Blood Product Expiration Date: 202509272359
Blood Product Expiration Date: 202510012359
ISSUE DATE / TIME: 202509241431
ISSUE DATE / TIME: 202509241440
Unit Type and Rh: 5100
Unit Type and Rh: 5100

## 2024-01-31 LAB — TYPE AND SCREEN
ABO/RH(D): O POS
Antibody Screen: NEGATIVE
Unit division: 0
Unit division: 0

## 2024-02-03 ENCOUNTER — Other Ambulatory Visit: Payer: Self-pay | Admitting: Nurse Practitioner

## 2024-02-11 ENCOUNTER — Ambulatory Visit

## 2024-02-11 DIAGNOSIS — M545 Low back pain, unspecified: Secondary | ICD-10-CM | POA: Diagnosis not present

## 2024-02-11 MED ORDER — DICLOFENAC SODIUM 1 % EX GEL: 2.0000 g | Freq: Four times a day (QID) | CUTANEOUS | 1 refills | Status: AC | PRN
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
## 2024-02-15 ENCOUNTER — Ambulatory Visit (HOSPITAL_COMMUNITY)
Admission: RE | Admit: 2024-02-15 | Discharge: 2024-02-15 | Disposition: A | Discharge status: Home / Self Care | Source: Ambulatory Visit

## 2024-02-15 DIAGNOSIS — M545 Low back pain, unspecified: Secondary | ICD-10-CM | POA: Insufficient documentation

## 2024-02-15 DIAGNOSIS — M47816 Spondylosis without myelopathy or radiculopathy, lumbar region: Secondary | ICD-10-CM | POA: Diagnosis not present

## 2024-02-20 ENCOUNTER — Ambulatory Visit: Payer: Self-pay

## 2024-02-21 ENCOUNTER — Other Ambulatory Visit

## 2024-02-21 ENCOUNTER — Other Ambulatory Visit: Payer: Self-pay

## 2024-02-21 DIAGNOSIS — Z79899 Other long term (current) drug therapy: Secondary | ICD-10-CM | POA: Diagnosis not present

## 2024-02-21 DIAGNOSIS — B20 Human immunodeficiency virus [HIV] disease: Secondary | ICD-10-CM | POA: Diagnosis not present

## 2024-02-22 LAB — T-HELPER CELLS (CD4) COUNT (NOT AT ARMC)
CD4 % Helper T Cell: 23 % — ABNORMAL LOW (ref 33–65)
CD4 T Cell Abs: 445 /uL (ref 400–1790)

## 2024-02-24 LAB — COMPREHENSIVE METABOLIC PANEL WITH GFR
AG Ratio: 2.1 (calc) (ref 1.0–2.5)
ALT: 13 U/L (ref 9–46)
AST: 13 U/L (ref 10–35)
Albumin: 4.4 g/dL (ref 3.6–5.1)
Alkaline phosphatase (APISO): 69 U/L (ref 35–144)
BUN/Creatinine Ratio: 15 (calc) (ref 6–22)
BUN: 25 mg/dL (ref 7–25)
CO2: 26 mmol/L (ref 20–32)
Calcium: 9.3 mg/dL (ref 8.6–10.3)
Chloride: 105 mmol/L (ref 98–110)
Creat: 1.63 mg/dL — ABNORMAL HIGH (ref 0.70–1.28)
Globulin: 2.1 g/dL (ref 1.9–3.7)
Glucose, Bld: 92 mg/dL (ref 65–99)
Potassium: 4 mmol/L (ref 3.5–5.3)
Sodium: 140 mmol/L (ref 135–146)
Total Bilirubin: 0.4 mg/dL (ref 0.2–1.2)
Total Protein: 6.5 g/dL (ref 6.1–8.1)
eGFR: 43 mL/min/1.73m2 — ABNORMAL LOW (ref >=60)

## 2024-02-24 LAB — CBC
HCT: 41.5 % (ref 38.5–50.0)
Hemoglobin: 13.7 g/dL (ref 13.2–17.1)
MCH: 33.3 pg — ABNORMAL HIGH (ref 27.0–33.0)
MCHC: 33 g/dL (ref 32.0–36.0)
MCV: 101 fL — ABNORMAL HIGH (ref 80.0–100.0)
MPV: 8.8 fL (ref 7.5–12.5)
Platelets: 155 Thousand/uL (ref 140–400)
RBC: 4.11 Million/uL — ABNORMAL LOW (ref 4.20–5.80)
RDW: 15.3 % — ABNORMAL HIGH (ref 11.0–15.0)
WBC: 5 Thousand/uL (ref 3.8–10.8)

## 2024-02-24 LAB — HIV RNA, RTPCR W/R GT (RTI, PI,INT)
HIV 1 RNA Quant: NOT DETECTED {copies}/mL
HIV-1 RNA Quant, Log: NOT DETECTED {Log_copies}/mL

## 2024-02-28 ENCOUNTER — Ambulatory Visit: Payer: Self-pay | Admitting: Infectious Diseases

## 2024-03-06 ENCOUNTER — Other Ambulatory Visit: Payer: Self-pay

## 2024-03-06 ENCOUNTER — Encounter: Payer: Self-pay | Admitting: Infectious Diseases

## 2024-03-06 ENCOUNTER — Ambulatory Visit: Payer: Self-pay | Admitting: Infectious Diseases

## 2024-03-06 VITALS — BP 159/102 | HR 85 | Temp 97.6°F | Wt 176.0 lb

## 2024-03-06 DIAGNOSIS — Z7185 Encounter for immunization safety counseling: Secondary | ICD-10-CM | POA: Diagnosis not present

## 2024-03-06 DIAGNOSIS — Z113 Encounter for screening for infections with a predominantly sexual mode of transmission: Secondary | ICD-10-CM

## 2024-03-06 DIAGNOSIS — Z23 Encounter for immunization: Secondary | ICD-10-CM | POA: Diagnosis not present

## 2024-03-06 DIAGNOSIS — Z0000 Encounter for general adult medical examination without abnormal findings: Secondary | ICD-10-CM

## 2024-03-06 DIAGNOSIS — B20 Human immunodeficiency virus [HIV] disease: Secondary | ICD-10-CM

## 2024-03-06 DIAGNOSIS — Z5181 Encounter for therapeutic drug level monitoring: Secondary | ICD-10-CM

## 2024-03-06 MED ORDER — DOVATO 50-300 MG PO TABS: 1.0000 | ORAL_TABLET | Freq: Every day | ORAL | 5 refills | Status: AC
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
## 2024-03-10 ENCOUNTER — Ambulatory Visit (INDEPENDENT_AMBULATORY_CARE_PROVIDER_SITE_OTHER): Admitting: Family Medicine

## 2024-03-10 VITALS — BP 118/64 | HR 76 | Wt 174.0 lb

## 2024-03-10 DIAGNOSIS — M545 Low back pain, unspecified: Secondary | ICD-10-CM

## 2024-03-10 DIAGNOSIS — M25552 Pain in left hip: Secondary | ICD-10-CM

## 2024-03-10 NOTE — Patient Instructions (Signed)
 Good to see you today - Thank you for coming in  Things we discussed today:  We will get you in with physical therapy.
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
## 2024-03-13 DIAGNOSIS — Z79899 Other long term (current) drug therapy: Secondary | ICD-10-CM | POA: Diagnosis not present

## 2024-03-13 DIAGNOSIS — K219 Gastro-esophageal reflux disease without esophagitis: Secondary | ICD-10-CM | POA: Diagnosis not present

## 2024-03-13 DIAGNOSIS — J449 Chronic obstructive pulmonary disease, unspecified: Secondary | ICD-10-CM | POA: Diagnosis not present

## 2024-03-13 DIAGNOSIS — E785 Hyperlipidemia, unspecified: Secondary | ICD-10-CM | POA: Diagnosis not present

## 2024-03-13 DIAGNOSIS — B20 Human immunodeficiency virus [HIV] disease: Secondary | ICD-10-CM | POA: Diagnosis not present

## 2024-03-13 DIAGNOSIS — I129 Hypertensive chronic kidney disease with stage 1 through stage 4 chronic kidney disease, or unspecified chronic kidney disease: Secondary | ICD-10-CM | POA: Diagnosis not present

## 2024-03-13 DIAGNOSIS — N400 Enlarged prostate without lower urinary tract symptoms: Secondary | ICD-10-CM | POA: Diagnosis not present

## 2024-03-13 DIAGNOSIS — G8929 Other chronic pain: Secondary | ICD-10-CM | POA: Diagnosis not present

## 2024-03-13 DIAGNOSIS — N1832 Chronic kidney disease, stage 3b: Secondary | ICD-10-CM | POA: Diagnosis not present

## 2024-03-13 DIAGNOSIS — I739 Peripheral vascular disease, unspecified: Secondary | ICD-10-CM | POA: Diagnosis not present

## 2024-03-13 DIAGNOSIS — I672 Cerebral atherosclerosis: Secondary | ICD-10-CM | POA: Diagnosis not present

## 2024-03-13 DIAGNOSIS — I2510 Atherosclerotic heart disease of native coronary artery without angina pectoris: Secondary | ICD-10-CM | POA: Diagnosis not present

## 2024-03-14 ENCOUNTER — Telehealth: Payer: Self-pay

## 2024-03-14 NOTE — Telephone Encounter (Signed)
 Patient calls nurse line requesting update for PT referral.   Advised patient of turn around time on referrals.   Patient requesting update with referral information once completed.   Jeffrey JAYSON English, RN

## 2024-03-26 ENCOUNTER — Ambulatory Visit: Attending: Family Medicine

## 2024-03-26 ENCOUNTER — Other Ambulatory Visit: Payer: Self-pay

## 2024-03-26 DIAGNOSIS — M25552 Pain in left hip: Secondary | ICD-10-CM | POA: Insufficient documentation

## 2024-03-26 DIAGNOSIS — G8929 Other chronic pain: Secondary | ICD-10-CM | POA: Diagnosis not present

## 2024-03-26 DIAGNOSIS — M25562 Pain in left knee: Secondary | ICD-10-CM | POA: Diagnosis not present

## 2024-03-26 DIAGNOSIS — M545 Low back pain, unspecified: Secondary | ICD-10-CM | POA: Insufficient documentation
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
## 2024-04-01 ENCOUNTER — Ambulatory Visit

## 2024-04-01 DIAGNOSIS — M25552 Pain in left hip: Secondary | ICD-10-CM

## 2024-04-01 DIAGNOSIS — M25562 Pain in left knee: Secondary | ICD-10-CM | POA: Diagnosis not present

## 2024-04-01 DIAGNOSIS — M545 Low back pain, unspecified: Secondary | ICD-10-CM | POA: Diagnosis not present

## 2024-04-01 DIAGNOSIS — G8929 Other chronic pain: Secondary | ICD-10-CM
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
## 2024-04-07 ENCOUNTER — Ambulatory Visit

## 2024-04-07 DIAGNOSIS — M25551 Pain in right hip: Secondary | ICD-10-CM | POA: Diagnosis present

## 2024-04-07 DIAGNOSIS — G8929 Other chronic pain: Secondary | ICD-10-CM | POA: Insufficient documentation

## 2024-04-07 DIAGNOSIS — M25562 Pain in left knee: Secondary | ICD-10-CM | POA: Diagnosis present

## 2024-04-07 DIAGNOSIS — M25552 Pain in left hip: Secondary | ICD-10-CM | POA: Diagnosis present
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
## 2024-04-10 ENCOUNTER — Ambulatory Visit

## 2024-04-14 ENCOUNTER — Ambulatory Visit

## 2024-04-14 DIAGNOSIS — M25552 Pain in left hip: Secondary | ICD-10-CM

## 2024-04-14 DIAGNOSIS — G8929 Other chronic pain: Secondary | ICD-10-CM

## 2024-04-14 DIAGNOSIS — M25562 Pain in left knee: Secondary | ICD-10-CM | POA: Diagnosis not present

## 2024-04-14 DIAGNOSIS — M25551 Pain in right hip: Secondary | ICD-10-CM
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
## 2024-04-17 ENCOUNTER — Ambulatory Visit

## 2024-04-17 ENCOUNTER — Encounter: Payer: Self-pay | Admitting: Neurology

## 2024-04-17 ENCOUNTER — Ambulatory Visit: Payer: Medicare HMO | Admitting: Neurology

## 2024-04-17 VITALS — BP 162/90 | HR 98 | Ht 70.0 in | Wt 180.0 lb

## 2024-04-17 DIAGNOSIS — B20 Human immunodeficiency virus [HIV] disease: Secondary | ICD-10-CM

## 2024-04-17 DIAGNOSIS — I6521 Occlusion and stenosis of right carotid artery: Secondary | ICD-10-CM | POA: Diagnosis not present

## 2024-04-17 DIAGNOSIS — M5481 Occipital neuralgia: Secondary | ICD-10-CM

## 2024-04-17 DIAGNOSIS — I2510 Atherosclerotic heart disease of native coronary artery without angina pectoris: Secondary | ICD-10-CM | POA: Diagnosis not present

## 2024-04-17 DIAGNOSIS — M25562 Pain in left knee: Secondary | ICD-10-CM | POA: Diagnosis not present

## 2024-04-17 DIAGNOSIS — G8929 Other chronic pain: Secondary | ICD-10-CM

## 2024-04-17 DIAGNOSIS — M25552 Pain in left hip: Secondary | ICD-10-CM
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
## 2024-04-22 ENCOUNTER — Ambulatory Visit

## 2024-04-22 DIAGNOSIS — M25562 Pain in left knee: Secondary | ICD-10-CM | POA: Diagnosis not present

## 2024-04-22 DIAGNOSIS — G8929 Other chronic pain: Secondary | ICD-10-CM

## 2024-04-22 DIAGNOSIS — M25552 Pain in left hip: Secondary | ICD-10-CM
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
## 2024-04-23 ENCOUNTER — Ambulatory Visit: Admitting: Infectious Diseases

## 2024-04-23 ENCOUNTER — Other Ambulatory Visit: Payer: Self-pay

## 2024-04-23 ENCOUNTER — Encounter: Payer: Self-pay | Admitting: Infectious Diseases

## 2024-04-23 VITALS — BP 142/82 | HR 76 | Temp 97.3°F | Ht 70.0 in | Wt 183.0 lb

## 2024-04-23 DIAGNOSIS — Z7185 Encounter for immunization safety counseling: Secondary | ICD-10-CM

## 2024-04-23 DIAGNOSIS — Z79899 Other long term (current) drug therapy: Secondary | ICD-10-CM | POA: Diagnosis not present

## 2024-04-23 DIAGNOSIS — Z0000 Encounter for general adult medical examination without abnormal findings: Secondary | ICD-10-CM | POA: Diagnosis not present

## 2024-04-23 DIAGNOSIS — B20 Human immunodeficiency virus [HIV] disease: Secondary | ICD-10-CM | POA: Diagnosis not present

## 2024-04-23 DIAGNOSIS — Z113 Encounter for screening for infections with a predominantly sexual mode of transmission: Secondary | ICD-10-CM | POA: Diagnosis not present

## 2024-04-23 DIAGNOSIS — Z5181 Encounter for therapeutic drug level monitoring: Secondary | ICD-10-CM

## 2024-04-23 NOTE — Progress Notes (Signed)
 [address], [place], [place], [address]                                                                  Phn. [phone]; Fax: [phone]                                                                             Date: 04/23/24 Reason for Visit: Routine HIV care.  HPI: Jeffrey [name] is a 78 y.o.old male with a history of HIV ( previously on Truvada and [name] > stribild since  March 2014 for simplification of ART regimen>switched to Triumeq  in 09/02/2013 due  to increasing Cr which he has been on until now), Syphilis s/p treatment, CAD, CKD, COPD, GERD, HTN, CVA/TIA s/p RT sided weakness, COVID 19  who is here for regular HIV follow up. Patient has been previously followed by Dr Elaine for a long time.   Compliant with Triumeq , no concerns. Reports some fluctuations in his BP and Cardiology/PCP is adjusting his medications. He is supposed to get carotid US  and ABI. Last seen at Cardiology office on 5/19 and PCP on 5/16. Seen by Pulmonary on 5/14 for COPD and Neurology on 04/27/23 for headache. Denies smoking, alcohol and recreational drug use. He has dentures, and willing to get PCV 20. No other complaints.  12/17 Compliant with dovato , no missed doses or concerns. He is getting PT. Reports receiving shingrix vaccine 2 doses and declined covid booster as it made him sick. Discussed to fu in 6 months if viral load from today is undetectable.  He would like to get labs 2 weeks before office visit.  No concerns otherwise.  ROS: As stated in above HPI; all other systems were reviewed and are otherwise negative unless noted below  No reported fever / chills, night sweats, unintentional weight loss, acute visual change, odynophagia, chest pain/pressure, new or worsened SOB or WOB, nausea, vomiting,  diarrhea, dysuria, GU discharge, syncope, seizures, red/hot swollen joints, hallucinations / delusions, rashes, new allergies, unusual / excessive bleeding, swollen lymph nodes, or new hospitalizations since the pt was last seen.  PMH/ PSH/ FamHx / Social Hx , medications and allergies reviewed and updated as appropriate; please see corresponding tab in EHR / prior notes                                        Current Outpatient Medications on File Prior to Visit  Medication Sig Dispense Refill   albuterol  (VENTOLIN  HFA) 108 (90 Base) MCG/ACT inhaler Inhale 2 puffs into the  lungs every 6 (six) hours as needed for wheezing or shortness of breath. 9 g 6   Ascorbic Acid (VITAMIN C) 1000 MG tablet Take 1,000 mg by mouth daily at 6 PM.     aspirin  EC 81 MG tablet Take 1 tablet (81 mg total) by mouth daily.     Cholecalciferol (VITAMIN D3) 125 MCG (5000 UT) TABS Take 5,000 Units by mouth daily at 6 PM.     clopidogrel  (PLAVIX ) 75 MG tablet Take 1 tablet by mouth once daily 90 tablet 3   diclofenac  Sodium (VOLTAREN ) 1 % GEL Apply 2 g topically 4 (four) times daily as needed (pain). 300 each 1   dolutegravir -lamiVUDine  (DOVATO ) 50-300 MG tablet Take 1 tablet by mouth daily. 30 tablet 5   finasteride  (PROSCAR ) 5 MG tablet Take 5 mg by mouth in the morning and at bedtime.     losartan (COZAAR) 50 MG tablet Take 50 mg by mouth daily. Started by his kidney doctor- Dr. Dennise - [facility]     pantoprazole  (PROTONIX ) 20 MG tablet Take 1 tablet by mouth twice daily 180 tablet 0   rosuvastatin  (CRESTOR ) 20 MG tablet Take 1 tablet by mouth once daily 90 tablet 3   sildenafil (REVATIO) 20 MG tablet Take 20 mg by mouth as needed.     solifenacin (VESICARE) 10 MG tablet Take by mouth.     sucralfate  (CARAFATE ) 1 GM/10ML suspension Take 10 mLs (1 g total) by mouth every 6 (six) hours as needed. 420 mL 0   Tiotropium Bromide-Olodaterol (STIOLTO RESPIMAT ) 2.5-2.5 MCG/ACT AERS INHALE 2 PUFFS BY MOUTH ONCE DAILY 4 g 8    vitamin B-12 (CYANOCOBALAMIN) 500 MCG tablet Take 500 mcg by mouth daily.     Zinc 50 MG TABS Take 50 mg by mouth daily at 6 PM.     No current facility-administered medications on file prior to visit.   Allergies  Allergen Reactions   Baclofen  Other (See Comments)    Back pain. Patient said it made him feel crazy    Efavirenz  Rash   Nevirapine Rash and Other (See Comments)    Past Medical History:  Diagnosis Date   CAD (coronary artery disease)    Chronic kidney disease    Colon polyps    COPD (chronic obstructive pulmonary disease) (HCC)    COVID-19    05/2019   GERD (gastroesophageal reflux disease)    Heart murmur    History of kidney stones    HIV (human immunodeficiency virus infection) (HCC) 1999   Hypertension    Pneumonia    Stroke (HCC)    right side weakness   TIA (transient ischemic attack)    Past Surgical History:  Procedure Laterality Date   CATARACT EXTRACTION W/ INTRAOCULAR LENS IMPLANT Bilateral    CHOLECYSTECTOMY  1982   COLONOSCOPY     ESOPHAGEAL MANOMETRY N/A 09/03/2017   Procedure: ESOPHAGEAL MANOMETRY (EM);  Surgeon: Shila Gustav GAILS, MD;  Location: [facility];  Service: Endoscopy;  Laterality: N/A;   IR ANGIO INTRA EXTRACRAN SEL COM CAROTID INNOMINATE BILAT MOD SED  04/21/2019   IR ANGIO INTRA EXTRACRAN SEL COM CAROTID INNOMINATE BILAT MOD SED  05/11/2020   IR ANGIO VERTEBRAL SEL VERTEBRAL BILAT MOD SED  04/21/2019   IR ANGIO VERTEBRAL SEL VERTEBRAL BILAT MOD SED  05/11/2020   IR PTA INTRACRANIAL  05/14/2019   IR US  GUIDE VASC ACCESS RIGHT  05/11/2020   KNEE SURGERY     LEFT KNEE   RADIOLOGY WITH  ANESTHESIA N/A 05/14/2019   Procedure: IR WITH ANESTHESIA STENTING;  Surgeon: Dolphus Carrion, MD;  Location: [facility] OR;  Service: Radiology;  Laterality: N/A;   TONSILLECTOMY     Social History   Socioeconomic History   Marital status: Divorced    Spouse name: Not on file   Number of children: 2   Years of education: 14   Highest  education level: Some college, no degree  Occupational History   Occupation: retired    Associate Professor: [employer]  Tobacco Use   Smoking status: Never    Passive exposure: Never   Smokeless tobacco: Former    Types: Chew    Quit date: 2019  Vaping Use   Vaping status: Never Used  Substance and Sexual Activity   Alcohol use: Yes    Comment: rare - few per year   Drug use: No   Sexual activity: Not Currently    Partners: Female    Comment: declined condoms  Other Topics Concern   Not on file  Social History Narrative   Lives alone   Right handed    Caffeine use: tea, mainly water      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, English As A Foreign Language   2 children - 1 boy and 1 girl   Social Drivers of Health   Tobacco Use: Medium Risk (04/17/2024)   Patient History    Smoking Tobacco Use: Never    Smokeless Tobacco Use: Former    Passive Exposure: Never  Physicist, Medical Strain: Low Risk (07/30/2023)   Overall Financial Resource Strain (CARDIA)    Difficulty of Paying Living Expenses: Not hard at all  Food Insecurity: No Food Insecurity (07/30/2023)   Hunger Vital Sign    Worried About Running Out of Food in the Last Year: Never true    Ran Out of Food in the Last Year: Never true  Transportation Needs: No Transportation Needs (07/30/2023)   PRAPARE - Administrator, Civil Service (Medical): No    Lack of Transportation (Non-Medical): No  Physical Activity: Sufficiently Active (07/30/2023)   Exercise Vital Sign    Days of Exercise per Week: 7 days    Minutes of Exercise per Session: 60 min  Stress: No Stress Concern Present (07/30/2023)   [employer] of Occupational Health - Occupational Stress Questionnaire    Feeling of Stress : Not at all  Social Connections: Socially Isolated (07/30/2023)   Social Connection and Isolation Panel    Frequency of Communication with Friends and Family: More than three times a week    Frequency of Social Gatherings with Friends and Family: Once a week     Attends Religious Services: Never    Database Administrator or Organizations: Patient declined    Attends Banker Meetings: Never    Marital Status: Divorced  Catering Manager Violence: Not At Risk (07/30/2023)   Humiliation, Afraid, Rape, and Kick questionnaire    Fear of Current or Ex-Partner: No    Emotionally Abused: No    Physically Abused: No    Sexually Abused: No  Depression (PHQ2-9): Low Risk (03/10/2024)   Depression (PHQ2-9)    PHQ-2 Score: 0  Alcohol Screen: Low Risk (07/30/2023)   Alcohol Screen    Last Alcohol Screening Score (AUDIT): 0  Housing: Unknown (02/08/2024)   [facility]   Epic    Unable to Pay for Housing in the Last Year: Not on file    Number of Times Moved  in the Last Year: Not on file    At any time in the past 12 months, were you homeless or living in a shelter (including now)?: No  Utilities: Not At Risk (07/30/2023)   [facility] Utilities    Threatened with loss of utilities: No  Health Literacy: Adequate Health Literacy (07/30/2023)   B1300 Health Literacy    Frequency of need for help with medical instructions: Never   Family History  Problem Relation Age of Onset   Hypertension Mother    Heart attack Mother 56   Alzheimer's disease Sister    Heart disease Sister    Alzheimer's disease Sister    Drug abuse Sister    COPD Brother    Kidney cancer Brother    Colon cancer Neg Hx    Esophageal cancer Neg Hx    Pancreatic cancer Neg Hx    Stomach cancer Neg Hx    Liver disease Neg Hx    Vitals  BP (!) 142/82   Pulse 76   Temp (!) 97.3 F (36.3 C) (Temporal)   Ht 5' 10 (1.778 m)   Wt 183 lb (83 kg)   SpO2 99%   BMI 26.26 kg/m   Examination  Gen: no acute distress HEENT: [place]/AT, no scleral icterus, no pale conjunctivae, hearing normal, oral mucosa moist Neck: Supple Cardio: Normal HR Resp: Pulmonary effort normal in room air GI: nondistended GU: Musc: Extremities: No pedal edema Skin: No  rashes Neuro: grossly non focal , awake, alert and oriented * 3  Psych: Calm, cooperative  Lab Results HIV 1 RNA Quant  Date Value  02/21/2024 NOT DETECTED copies/mL  09/19/2023 NOT DETECTED copies/mL  03/20/2023 Not Detected Copies/mL   CD4 T Cell Abs (/uL)  Date Value  02/21/2024 445  03/20/2023 501  02/20/2022 499   No results found for: [id number] Lab Results  Component Value Date   WBC 5.0 02/21/2024   HGB 13.7 02/21/2024   HCT 41.5 02/21/2024   MCV 101.0 (H) 02/21/2024   PLT 155 02/21/2024    Lab Results  Component Value Date   CREATININE 1.63 (H) 02/21/2024   BUN 25 02/21/2024   NA 140 02/21/2024   K 4.0 02/21/2024   CL 105 02/21/2024   CO2 26 02/21/2024   Lab Results  Component Value Date   ALT 13 02/21/2024   AST 13 02/21/2024   ALKPHOS 75 01/30/2024   BILITOT 0.4 02/21/2024    Lab Results  Component Value Date   CHOL 121 09/19/2023   TRIG 132 09/19/2023   HDL 45 09/19/2023   LDLCALC 54 09/19/2023   No results found for: HAV Lab Results  Component Value Date   HEPBSAG No 07/02/2006   HEPBSAB No 07/02/2006   Lab Results  Component Value Date   HCVAB No 07/02/2006   Lab Results  Component Value Date   CHLAMYDIAWP Negative 03/20/2023   N Negative 03/20/2023   No results found for: GCPROBEAPT No results found for: QUANTGOLD  Pathology of colonoscopy 10/24/22 Diagnosis 1. Surgical [P], colon, transverse, polyp (1) - TUBULAR ADENOMA - NEGATIVE FOR HIGH-GRADE DYSPLASIA OR MALIGNANCY 2. Surgical [P], colon, recto-sigmoid erosions - FOCALLY ACTIVE COLITIS WITH EROSION/ULCERATION, FIBROSIS, REACTIVE/REGENERATIVE TYPE CHANGES, AND ASSOCIATED MILD ARCHITECTURAL DISTORTION - NEGATIVE FOR GRANULOMATA, DYSPLASIA OR MALIGNANCY - SEE NOTE Diagnosis Note 2. No definitive features of chronicity are identified. The histologic features are non-specific and can be seen in low grade ischemia, medication effect, self-limited infectious processes  and inflammatory bowel disease. Clinical-pathologic  correlation is recommended.  Health Maintenance: Immunization History  Administered Date(s) Administered   Fluad Quad(high Dose 65+) 03/07/2022   Fluad Trivalent(High Dose 65+) 04/03/2023   H1N1 09/22/2008   Hepatitis B 09/24/2001, 11/07/2001, 05/26/2002   INFLUENZA, HIGH DOSE SEASONAL PF 03/06/2024   Influenza Whole 02/20/2006, 03/21/2007, 03/31/2008, 03/08/2009, 01/26/2010, 12/28/2010   Influenza,inj,Quad PF,6+ Mos 04/10/2013, 01/27/2014, 01/21/2015, 01/20/2016, 01/23/2017, 02/07/2018, 01/21/2019, 01/31/2021   Influenza-Unspecified 03/13/2013   Moderna Sars-Covid-2 Vaccination 09/17/2019, 10/15/2019   PFIZER Comirnaty(Gray Top)Covid-19 Tri-Sucrose Vaccine 05/20/2021   PNEUMOCOCCAL CONJUGATE-20 10/03/2023   Pfizer(Comirnaty)Fall Seasonal Vaccine 12 years and older 03/07/2022, 04/03/2023   Pneumococcal Conjugate-13 01/06/2015   Pneumococcal Polysaccharide-23 09/22/2008, 08/07/2013   Tdap 01/06/2015   Assessment/Plan: # HIV - Adherence assessed, side effects reviewed/discussed and DDIs reviewed  - HIV RNA today - Continue Dovato   - Fu in 6 months. Lab visit 2 weeks before.   # Chronic conditions ( CAD, HTN, CKD, COPD, CVA) - Fu with PCP and other specialists respectively. Follows Nephrology Dr Dennise   # STD Screening  - no acute concerns  - Deferred  #Health maintenance - Dental care has been discussed  - Colonoscopy done in 10/24/22, no need for further colonoscopies.  Patient's labs were reviewed as well as his previous records. Patients questions were addressed and answered. Safe sex counseling done.   I spent 30 minutes involved in face-to-face and non-face-to-face activities for this patient on the day of the visit. Professional time spent includes the following activities: Preparing to see the patient (review of tests), Obtaining and reviewing separately obtained history (Neurology 12/11, PCP note 11/3 ), Performing  a medically appropriate examination and evaluation, Ordering labs, medication, Documenting clinical information in the EMR, Independently interpreting results (not separately reported), Communicating results to the patient,  Counseling and educating the patient and Care coordination (not separately reported).   Of note, portions of this note may have been created with voice recognition software. While this note has been edited for accuracy, occasional wrong-word or sound-a-like substitutions may have occurred due to the inherent limitations of voice recognition software.   Electronically signed by:  Annalee Orem, MD Infectious Disease Physician [facility] [address] [place], [place] [address] Phone: [phone]  Fax: [phone]

## 2024-04-25 ENCOUNTER — Ambulatory Visit

## 2024-04-25 DIAGNOSIS — M25552 Pain in left hip: Secondary | ICD-10-CM

## 2024-04-25 DIAGNOSIS — M25562 Pain in left knee: Secondary | ICD-10-CM | POA: Diagnosis not present

## 2024-04-25 DIAGNOSIS — G8929 Other chronic pain: Secondary | ICD-10-CM
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
## 2024-04-26 ENCOUNTER — Other Ambulatory Visit: Payer: Self-pay | Admitting: Gastroenterology

## 2024-04-27 LAB — HIV RNA, RTPCR W/R GT (RTI, PI,INT)
HIV 1 RNA Quant: NOT DETECTED {copies}/mL
HIV-1 RNA Quant, Log: NOT DETECTED {Log_copies}/mL

## 2024-04-28 ENCOUNTER — Ambulatory Visit: Payer: Self-pay | Admitting: Infectious Diseases

## 2024-05-07 ENCOUNTER — Ambulatory Visit

## 2024-05-07 DIAGNOSIS — M25562 Pain in left knee: Secondary | ICD-10-CM | POA: Diagnosis not present

## 2024-05-07 DIAGNOSIS — G8929 Other chronic pain: Secondary | ICD-10-CM

## 2024-05-07 DIAGNOSIS — M25552 Pain in left hip: Secondary | ICD-10-CM
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
## 2024-05-13 ENCOUNTER — Ambulatory Visit

## 2024-05-15 ENCOUNTER — Ambulatory Visit

## 2024-05-28 ENCOUNTER — Other Ambulatory Visit: Payer: Self-pay | Admitting: Cardiovascular Disease

## 2024-05-28 DIAGNOSIS — E785 Hyperlipidemia, unspecified: Secondary | ICD-10-CM

## 2024-06-04 MED ORDER — ROSUVASTATIN CALCIUM 20 MG PO TABS: 20.0000 mg | ORAL_TABLET | Freq: Every day | ORAL | 1 refills | Status: AC

## 2024-06-04 NOTE — Telephone Encounter (Signed)
 Lipid Panel Completed on 09/19/23

## 2024-06-05 NOTE — Progress Notes (Signed)
 Jeffrey [name]                                          MRN: [id number]   06/05/2024   The [facility] Quality Team Specialist reviewed this patient medical record for the purposes of chart review for care gap closure. The following were reviewed: chart review for care gap closure-controlling blood pressure.    [facility] Quality Team

## 2024-07-02 ENCOUNTER — Ambulatory Visit: Admitting: Gastroenterology

## 2024-07-31 ENCOUNTER — Encounter

## 2024-09-24 ENCOUNTER — Ambulatory Visit: Admitting: Emergency Medicine

## 2024-10-22 ENCOUNTER — Other Ambulatory Visit: Payer: Self-pay

## 2024-11-05 ENCOUNTER — Ambulatory Visit: Payer: Self-pay | Admitting: Infectious Diseases
